# Patient Record
Sex: Female | Born: 1960 | Race: Black or African American | Hispanic: No | Smoking: Never smoker
Health system: Southern US, Community
[De-identification: ages and names within clinical notes are randomized; demographics above are authoritative.]

## PROBLEM LIST (undated history)

## (undated) ENCOUNTER — Emergency Department (HOSPITAL_COMMUNITY): Admission: EM | Payer: Self-pay | Source: Home / Self Care

## (undated) DIAGNOSIS — M899 Disorder of bone, unspecified: Secondary | ICD-10-CM

## (undated) DIAGNOSIS — K589 Irritable bowel syndrome without diarrhea: Secondary | ICD-10-CM

## (undated) DIAGNOSIS — I951 Orthostatic hypotension: Secondary | ICD-10-CM

## (undated) DIAGNOSIS — L97909 Non-pressure chronic ulcer of unspecified part of unspecified lower leg with unspecified severity: Secondary | ICD-10-CM

## (undated) DIAGNOSIS — K3184 Gastroparesis: Secondary | ICD-10-CM

## (undated) DIAGNOSIS — K5901 Slow transit constipation: Secondary | ICD-10-CM

## (undated) DIAGNOSIS — I10 Essential (primary) hypertension: Secondary | ICD-10-CM

## (undated) DIAGNOSIS — R143 Flatulence: Secondary | ICD-10-CM

## (undated) DIAGNOSIS — T8859XA Other complications of anesthesia, initial encounter: Secondary | ICD-10-CM

## (undated) DIAGNOSIS — K2100 Gastro-esophageal reflux disease with esophagitis, without bleeding: Secondary | ICD-10-CM

## (undated) DIAGNOSIS — R112 Nausea with vomiting, unspecified: Secondary | ICD-10-CM

## (undated) DIAGNOSIS — R142 Eructation: Secondary | ICD-10-CM

## (undated) DIAGNOSIS — Z9889 Other specified postprocedural states: Secondary | ICD-10-CM

## (undated) DIAGNOSIS — M949 Disorder of cartilage, unspecified: Secondary | ICD-10-CM

## (undated) DIAGNOSIS — T4145XA Adverse effect of unspecified anesthetic, initial encounter: Secondary | ICD-10-CM

## (undated) DIAGNOSIS — R141 Gas pain: Secondary | ICD-10-CM

## (undated) HISTORY — DX: Slow transit constipation: K59.01

## (undated) HISTORY — DX: Flatulence: R14.3

## (undated) HISTORY — PX: TUBAL LIGATION: SHX77

## (undated) HISTORY — DX: Disorder of bone, unspecified: M89.9

## (undated) HISTORY — DX: Orthostatic hypotension: I95.1

## (undated) HISTORY — DX: Gastroparesis: K31.84

## (undated) HISTORY — DX: Disorder of cartilage, unspecified: M94.9

## (undated) HISTORY — DX: Gas pain: R14.1

## (undated) HISTORY — DX: Gastro-esophageal reflux disease with esophagitis, without bleeding: K21.00

## (undated) HISTORY — DX: Irritable bowel syndrome, unspecified: K58.9

## (undated) HISTORY — DX: Gastro-esophageal reflux disease with esophagitis: K21.0

## (undated) HISTORY — DX: Gas pain: R14.2

## (undated) HISTORY — DX: Essential (primary) hypertension: I10

---

## 2001-06-25 ENCOUNTER — Emergency Department (HOSPITAL_COMMUNITY): Admission: EM | Admit: 2001-06-25 | Discharge: 2001-06-25 | Payer: Self-pay | Admitting: Emergency Medicine

## 2001-12-02 ENCOUNTER — Emergency Department (HOSPITAL_COMMUNITY): Admission: EM | Admit: 2001-12-02 | Discharge: 2001-12-02 | Payer: Self-pay | Admitting: Emergency Medicine

## 2003-01-22 ENCOUNTER — Other Ambulatory Visit: Admission: RE | Admit: 2003-01-22 | Discharge: 2003-01-22 | Payer: Self-pay | Admitting: Obstetrics and Gynecology

## 2003-01-22 ENCOUNTER — Encounter (INDEPENDENT_AMBULATORY_CARE_PROVIDER_SITE_OTHER): Payer: Self-pay

## 2003-01-22 ENCOUNTER — Encounter: Admission: RE | Admit: 2003-01-22 | Discharge: 2003-01-22 | Payer: Self-pay | Admitting: Obstetrics and Gynecology

## 2003-02-13 ENCOUNTER — Encounter: Admission: RE | Admit: 2003-02-13 | Discharge: 2003-02-13 | Payer: Self-pay | Admitting: Obstetrics and Gynecology

## 2003-05-11 ENCOUNTER — Emergency Department (HOSPITAL_COMMUNITY): Admission: EM | Admit: 2003-05-11 | Discharge: 2003-05-11 | Payer: Self-pay | Admitting: Hematology and Oncology

## 2003-09-11 ENCOUNTER — Emergency Department (HOSPITAL_COMMUNITY): Admission: EM | Admit: 2003-09-11 | Discharge: 2003-09-11 | Payer: Self-pay | Admitting: Internal Medicine

## 2004-03-29 ENCOUNTER — Emergency Department (HOSPITAL_COMMUNITY): Admission: EM | Admit: 2004-03-29 | Discharge: 2004-03-29 | Payer: Self-pay | Admitting: Emergency Medicine

## 2004-09-12 ENCOUNTER — Emergency Department (HOSPITAL_COMMUNITY): Admission: EM | Admit: 2004-09-12 | Discharge: 2004-09-12 | Payer: Self-pay | Admitting: Emergency Medicine

## 2005-02-16 ENCOUNTER — Emergency Department (HOSPITAL_COMMUNITY): Admission: EM | Admit: 2005-02-16 | Discharge: 2005-02-17 | Payer: Self-pay | Admitting: Emergency Medicine

## 2005-02-19 ENCOUNTER — Emergency Department (HOSPITAL_COMMUNITY): Admission: EM | Admit: 2005-02-19 | Discharge: 2005-02-19 | Payer: Self-pay | Admitting: Family Medicine

## 2006-11-02 ENCOUNTER — Ambulatory Visit (HOSPITAL_COMMUNITY): Admission: RE | Admit: 2006-11-02 | Discharge: 2006-11-02 | Payer: Self-pay | Admitting: Family Medicine

## 2007-07-04 ENCOUNTER — Emergency Department (HOSPITAL_COMMUNITY): Admission: EM | Admit: 2007-07-04 | Discharge: 2007-07-04 | Payer: Self-pay | Admitting: Emergency Medicine

## 2009-05-23 ENCOUNTER — Emergency Department (HOSPITAL_COMMUNITY): Admission: EM | Admit: 2009-05-23 | Discharge: 2009-05-23 | Payer: Self-pay | Admitting: Emergency Medicine

## 2009-10-13 ENCOUNTER — Emergency Department (HOSPITAL_COMMUNITY): Admission: EM | Admit: 2009-10-13 | Discharge: 2009-10-13 | Payer: Self-pay | Admitting: Emergency Medicine

## 2010-01-08 ENCOUNTER — Emergency Department (HOSPITAL_COMMUNITY): Admission: EM | Admit: 2010-01-08 | Discharge: 2010-01-08 | Payer: Self-pay | Admitting: Emergency Medicine

## 2010-02-24 ENCOUNTER — Encounter: Payer: Self-pay | Admitting: Gastroenterology

## 2010-03-14 ENCOUNTER — Ambulatory Visit (HOSPITAL_COMMUNITY): Admission: RE | Admit: 2010-03-14 | Discharge: 2010-03-14 | Payer: Self-pay | Admitting: Gastroenterology

## 2010-03-14 ENCOUNTER — Ambulatory Visit: Payer: Self-pay | Admitting: Gastroenterology

## 2010-12-04 ENCOUNTER — Encounter: Payer: Self-pay | Admitting: Family Medicine

## 2010-12-13 NOTE — Letter (Signed)
Summary: Triage Order  Triage Order   Imported By: Ave Filter 02/24/2010 10:08:09  _____________________________________________________________________  External Attachment:    Type:   Image     Comment:   External Document  Appended Document: Triage Order Take half Lantus dose on PM before TCS. Hold Lantus AM of procedure. HALFLYTELY.  Appended Document: Triage Order Instructions mailed to pt.

## 2011-01-31 LAB — GLUCOSE, CAPILLARY: Glucose-Capillary: 145 mg/dL — ABNORMAL HIGH (ref 70–99)

## 2011-02-01 LAB — COMPREHENSIVE METABOLIC PANEL
ALT: 10 U/L (ref 0–35)
AST: 15 U/L (ref 0–37)
Albumin: 3.3 g/dL — ABNORMAL LOW (ref 3.5–5.2)
Alkaline Phosphatase: 77 U/L (ref 39–117)
BUN: 14 mg/dL (ref 6–23)
CO2: 28 mEq/L (ref 19–32)
Calcium: 9 mg/dL (ref 8.4–10.5)
Chloride: 100 mEq/L (ref 96–112)
Creatinine, Ser: 0.86 mg/dL (ref 0.4–1.2)
GFR calc Af Amer: >60 mL/min (ref >60)
GFR calc non Af Amer: >60 mL/min (ref >60)
Glucose, Bld: 310 mg/dL — ABNORMAL HIGH (ref 70–99)
Potassium: 3.8 mEq/L (ref 3.5–5.1)
Sodium: 135 mEq/L (ref 135–145)
Total Bilirubin: 0.7 mg/dL (ref 0.3–1.2)
Total Protein: 7.1 g/dL (ref 6.0–8.3)

## 2011-02-01 LAB — CBC
HCT: 32.1 % — ABNORMAL LOW (ref 36.0–46.0)
Hemoglobin: 10.8 g/dL — ABNORMAL LOW (ref 12.0–15.0)
MCHC: 33.7 g/dL (ref 30.0–36.0)
MCV: 88.2 fL (ref 78.0–100.0)
Platelets: 272 10*3/uL (ref 150–400)
RBC: 3.63 MIL/uL — ABNORMAL LOW (ref 3.87–5.11)
RDW: 14.1 % (ref 11.5–15.5)
WBC: 6.3 10*3/uL (ref 4.0–10.5)

## 2011-02-01 LAB — DIFFERENTIAL
Basophils Absolute: 0 10*3/uL (ref 0.0–0.1)
Basophils Relative: 0 % (ref 0–1)
Eosinophils Absolute: 0 10*3/uL (ref 0.0–0.7)
Eosinophils Relative: 0 % (ref 0–5)
Lymphocytes Relative: 24 % (ref 12–46)
Lymphs Abs: 1.5 10*3/uL (ref 0.7–4.0)
Monocytes Absolute: 0.7 10*3/uL (ref 0.1–1.0)
Monocytes Relative: 11 % (ref 3–12)
Neutro Abs: 4.1 10*3/uL (ref 1.7–7.7)
Neutrophils Relative %: 65 % (ref 43–77)

## 2011-02-01 LAB — URINALYSIS, ROUTINE W REFLEX MICROSCOPIC
Bilirubin Urine: NEGATIVE
Glucose, UA: >1000 mg/dL — AB
Ketones, ur: NEGATIVE mg/dL
Nitrite: POSITIVE — AB
Protein, ur: 30 mg/dL — AB
Specific Gravity, Urine: 1.025 (ref 1.005–1.030)
Urobilinogen, UA: 0.2 mg/dL (ref 0.0–1.0)
pH: 5.5 (ref 5.0–8.0)

## 2011-02-01 LAB — URINE MICROSCOPIC-ADD ON

## 2011-02-01 LAB — PREGNANCY, URINE: Preg Test, Ur: NEGATIVE

## 2011-02-14 LAB — URINALYSIS, ROUTINE W REFLEX MICROSCOPIC
Bilirubin Urine: NEGATIVE
Glucose, UA: >1000 mg/dL — AB
Ketones, ur: NEGATIVE mg/dL
Nitrite: POSITIVE — AB
Protein, ur: NEGATIVE mg/dL
Specific Gravity, Urine: 1.01 (ref 1.005–1.030)
Urobilinogen, UA: 0.2 mg/dL (ref 0.0–1.0)
pH: 5.5 (ref 5.0–8.0)

## 2011-02-14 LAB — COMPREHENSIVE METABOLIC PANEL
ALT: 10 U/L (ref 0–35)
AST: 17 U/L (ref 0–37)
Albumin: 3.8 g/dL (ref 3.5–5.2)
Alkaline Phosphatase: 91 U/L (ref 39–117)
BUN: 7 mg/dL (ref 6–23)
CO2: 27 mEq/L (ref 19–32)
Calcium: 9.3 mg/dL (ref 8.4–10.5)
Chloride: 96 mEq/L (ref 96–112)
Creatinine, Ser: 0.89 mg/dL (ref 0.4–1.2)
GFR calc Af Amer: >60 mL/min (ref >60)
GFR calc non Af Amer: >60 mL/min (ref >60)
Glucose, Bld: 486 mg/dL — ABNORMAL HIGH (ref 70–99)
Potassium: 3.6 mEq/L (ref 3.5–5.1)
Sodium: 130 mEq/L — ABNORMAL LOW (ref 135–145)
Total Bilirubin: 0.8 mg/dL (ref 0.3–1.2)
Total Protein: 7.6 g/dL (ref 6.0–8.3)

## 2011-02-14 LAB — URINE MICROSCOPIC-ADD ON

## 2011-02-14 LAB — CBC
HCT: 32.3 % — ABNORMAL LOW (ref 36.0–46.0)
Hemoglobin: 10.8 g/dL — ABNORMAL LOW (ref 12.0–15.0)
MCHC: 33.5 g/dL (ref 30.0–36.0)
MCV: 86.6 fL (ref 78.0–100.0)
Platelets: 285 10*3/uL (ref 150–400)
RBC: 3.73 MIL/uL — ABNORMAL LOW (ref 3.87–5.11)
RDW: 13.7 % (ref 11.5–15.5)
WBC: 8.8 10*3/uL (ref 4.0–10.5)

## 2011-02-14 LAB — DIFFERENTIAL
Basophils Absolute: 0 10*3/uL (ref 0.0–0.1)
Basophils Relative: 0 % (ref 0–1)
Eosinophils Absolute: 0 10*3/uL (ref 0.0–0.7)
Eosinophils Relative: 0 % (ref 0–5)
Lymphocytes Relative: 17 % (ref 12–46)
Lymphs Abs: 1.5 10*3/uL (ref 0.7–4.0)
Monocytes Absolute: 0.5 10*3/uL (ref 0.1–1.0)
Monocytes Relative: 6 % (ref 3–12)
Neutro Abs: 6.8 10*3/uL (ref 1.7–7.7)
Neutrophils Relative %: 77 % (ref 43–77)

## 2011-02-14 LAB — LIPASE, BLOOD: Lipase: 18 U/L (ref 11–59)

## 2011-02-14 LAB — PREGNANCY, URINE: Preg Test, Ur: NEGATIVE

## 2011-02-14 LAB — GLUCOSE, CAPILLARY: Glucose-Capillary: 168 mg/dL — ABNORMAL HIGH (ref 70–99)

## 2011-02-19 LAB — URINE MICROSCOPIC-ADD ON

## 2011-02-19 LAB — URINALYSIS, ROUTINE W REFLEX MICROSCOPIC
Bilirubin Urine: NEGATIVE
Glucose, UA: 500 mg/dL — AB
Ketones, ur: NEGATIVE mg/dL
Nitrite: POSITIVE — AB
Protein, ur: 30 mg/dL — AB
Specific Gravity, Urine: 1.015 (ref 1.005–1.030)
Urobilinogen, UA: 0.2 mg/dL (ref 0.0–1.0)
pH: 7 (ref 5.0–8.0)

## 2011-02-19 LAB — URINE CULTURE: Colony Count: >=100000

## 2011-02-19 LAB — BASIC METABOLIC PANEL
BUN: 6 mg/dL (ref 6–23)
CO2: 28 mEq/L (ref 19–32)
Calcium: 8.7 mg/dL (ref 8.4–10.5)
Chloride: 105 mEq/L (ref 96–112)
Creatinine, Ser: 0.7 mg/dL (ref 0.4–1.2)
GFR calc Af Amer: >60 mL/min (ref >60)
GFR calc non Af Amer: >60 mL/min (ref >60)
Glucose, Bld: 188 mg/dL — ABNORMAL HIGH (ref 70–99)
Potassium: 3.7 mEq/L (ref 3.5–5.1)
Sodium: 136 mEq/L (ref 135–145)

## 2011-02-19 LAB — PREGNANCY, URINE: Preg Test, Ur: NEGATIVE

## 2011-05-08 ENCOUNTER — Emergency Department (HOSPITAL_COMMUNITY)
Admission: EM | Admit: 2011-05-08 | Discharge: 2011-05-08 | Disposition: A | Discharge status: Home / Self Care | Payer: Self-pay | Attending: Emergency Medicine | Admitting: Emergency Medicine

## 2011-05-08 DIAGNOSIS — R51 Headache: Secondary | ICD-10-CM | POA: Insufficient documentation

## 2011-05-08 DIAGNOSIS — N390 Urinary tract infection, site not specified: Secondary | ICD-10-CM | POA: Insufficient documentation

## 2011-05-08 DIAGNOSIS — R7309 Other abnormal glucose: Secondary | ICD-10-CM | POA: Insufficient documentation

## 2011-05-08 DIAGNOSIS — R42 Dizziness and giddiness: Secondary | ICD-10-CM | POA: Insufficient documentation

## 2011-05-08 LAB — URINE MICROSCOPIC-ADD ON

## 2011-05-08 LAB — URINALYSIS, ROUTINE W REFLEX MICROSCOPIC
Glucose, UA: >1000 mg/dL — AB
Hgb urine dipstick: NEGATIVE
Leukocytes, UA: NEGATIVE
Protein, ur: NEGATIVE mg/dL
Specific Gravity, Urine: 1.015 (ref 1.005–1.030)
pH: 6 (ref 5.0–8.0)

## 2011-05-08 LAB — GLUCOSE, CAPILLARY
Glucose-Capillary: >600 mg/dL (ref 70–99)
Glucose-Capillary: 313 mg/dL — ABNORMAL HIGH (ref 70–99)

## 2011-05-08 LAB — DIFFERENTIAL
Basophils Absolute: 0 10*3/uL (ref 0.0–0.1)
Basophils Relative: 0 % (ref 0–1)
Eosinophils Relative: 1 % (ref 0–5)
Lymphocytes Relative: 28 % (ref 12–46)
Monocytes Absolute: 0.4 10*3/uL (ref 0.1–1.0)
Neutro Abs: 3.5 10*3/uL (ref 1.7–7.7)

## 2011-05-08 LAB — CBC
HCT: 33.5 % — ABNORMAL LOW (ref 36.0–46.0)
MCHC: 33.7 g/dL (ref 30.0–36.0)
Platelets: 247 10*3/uL (ref 150–400)
RDW: 12.4 % (ref 11.5–15.5)
WBC: 5.3 10*3/uL (ref 4.0–10.5)

## 2011-05-08 LAB — HEPATIC FUNCTION PANEL
AST: 15 U/L (ref 0–37)
Albumin: 3.4 g/dL — ABNORMAL LOW (ref 3.5–5.2)
Total Protein: 7.1 g/dL (ref 6.0–8.3)

## 2011-05-08 LAB — BASIC METABOLIC PANEL
BUN: 14 mg/dL (ref 6–23)
Calcium: 9.5 mg/dL (ref 8.4–10.5)
GFR calc Af Amer: >60 mL/min (ref >60)
GFR calc non Af Amer: >60 mL/min (ref >60)
Glucose, Bld: 622 mg/dL (ref 70–99)
Potassium: 4.1 mEq/L (ref 3.5–5.1)
Sodium: 130 mEq/L — ABNORMAL LOW (ref 135–145)

## 2011-05-10 LAB — URINE CULTURE: Colony Count: >=100000

## 2011-05-28 ENCOUNTER — Emergency Department (HOSPITAL_COMMUNITY)
Admission: EM | Admit: 2011-05-28 | Discharge: 2011-05-28 | Disposition: A | Discharge status: Home / Self Care | Payer: Self-pay | Attending: Emergency Medicine | Admitting: Emergency Medicine

## 2011-05-28 DIAGNOSIS — E1169 Type 2 diabetes mellitus with other specified complication: Secondary | ICD-10-CM | POA: Insufficient documentation

## 2011-05-28 DIAGNOSIS — E162 Hypoglycemia, unspecified: Secondary | ICD-10-CM

## 2011-05-28 DIAGNOSIS — R42 Dizziness and giddiness: Secondary | ICD-10-CM | POA: Insufficient documentation

## 2011-05-28 DIAGNOSIS — Z79899 Other long term (current) drug therapy: Secondary | ICD-10-CM | POA: Insufficient documentation

## 2011-05-28 DIAGNOSIS — R5381 Other malaise: Secondary | ICD-10-CM | POA: Insufficient documentation

## 2011-05-28 DIAGNOSIS — R6883 Chills (without fever): Secondary | ICD-10-CM | POA: Insufficient documentation

## 2011-05-28 DIAGNOSIS — R4182 Altered mental status, unspecified: Secondary | ICD-10-CM | POA: Insufficient documentation

## 2011-05-28 DIAGNOSIS — R5383 Other fatigue: Secondary | ICD-10-CM | POA: Insufficient documentation

## 2011-05-28 LAB — DIFFERENTIAL
Basophils Absolute: 0 10*3/uL (ref 0.0–0.1)
Basophils Relative: 0 % (ref 0–1)
Eosinophils Relative: 1 % (ref 0–5)
Monocytes Absolute: 0.6 10*3/uL (ref 0.1–1.0)
Neutro Abs: 3.9 10*3/uL (ref 1.7–7.7)

## 2011-05-28 LAB — URINALYSIS, ROUTINE W REFLEX MICROSCOPIC
Ketones, ur: NEGATIVE mg/dL
Leukocytes, UA: NEGATIVE
Nitrite: NEGATIVE
Protein, ur: NEGATIVE mg/dL

## 2011-05-28 LAB — GLUCOSE, CAPILLARY: Glucose-Capillary: 121 mg/dL — ABNORMAL HIGH (ref 70–99)

## 2011-05-28 LAB — CBC
HCT: 32.7 % — ABNORMAL LOW (ref 36.0–46.0)
MCHC: 33 g/dL (ref 30.0–36.0)
Platelets: 248 10*3/uL (ref 150–400)
RDW: 13.4 % (ref 11.5–15.5)

## 2011-05-28 LAB — BASIC METABOLIC PANEL
Calcium: 9.2 mg/dL (ref 8.4–10.5)
Chloride: 101 mEq/L (ref 96–112)
Creatinine, Ser: 0.69 mg/dL (ref 0.50–1.10)
GFR calc Af Amer: >60 mL/min (ref >60)
Sodium: 135 mEq/L (ref 135–145)

## 2011-05-28 NOTE — ED Provider Notes (Signed)
History     Chief Complaint  Patient presents with  . Dizziness  . Fatigue  . Altered Mental Status   HPI Comments: Patient c/o sudden onset of generalized dizziness, weakness and feeling of confusion that began suddenly while she was sitting at church.  States she ate a small amt of food this morning and also states that she has not been checking her blood sugars lately, but usually been in the 300-400 range.  Was recently increased on her metformin and lantus dosages.  She denies vomitting, nausea, chest pain or shortness of breath.  She was concerned that her blood sugar may have been too low.  She also denies focal weakness, facial weakness or drooping, or speech difficulty  Patient is a 50 y.o. female presenting with altered mental status. The history is provided by the patient.  Altered Mental Status This is a new problem. The current episode started today. The problem occurs rarely. The problem has been resolved. Associated symptoms include chills, fatigue, headaches and weakness. Pertinent negatives include no abdominal pain, anorexia, arthralgias, change in bowel habit, chest pain, congestion, coughing, diaphoresis, fever, myalgias, nausea, neck pain, numbness, sore throat, urinary symptoms, visual change or vomiting. The symptoms are aggravated by nothing. She has tried nothing for the symptoms. The treatment provided no relief.    Past Medical History  Diagnosis Date  . Diabetes mellitus     History reviewed. No pertinent past surgical history.  History reviewed. No pertinent family history.  History  Substance Use Topics  . Smoking status: Never Smoker   . Smokeless tobacco: Not on file  . Alcohol Use: No    OB History    Grav Para Term Preterm Abortions TAB SAB Ect Mult Living                  Review of Systems  Constitutional: Positive for chills and fatigue. Negative for fever, diaphoresis and unexpected weight change.  HENT: Negative for ear pain, congestion,  sore throat, neck pain, sinus pressure and tinnitus.   Eyes: Negative for pain.  Respiratory: Negative for apnea, cough, chest tightness and shortness of breath.   Cardiovascular: Negative for chest pain, palpitations and leg swelling.  Gastrointestinal: Negative for nausea, vomiting, abdominal pain, anorexia and change in bowel habit.  Genitourinary: Negative for dysuria, urgency and frequency.  Musculoskeletal: Negative for myalgias and arthralgias.  Skin: Negative.   Neurological: Positive for dizziness, weakness and headaches. Negative for seizures, syncope, speech difficulty and numbness.  Hematological: Does not bruise/bleed easily.  Psychiatric/Behavioral: Positive for altered mental status. Negative for behavioral problems.    Physical Exam  BP 142/0  Pulse 72  Temp(Src) 98.6 F (37 C) (Oral)  Resp 20  Ht 5\' 7"  (1.702 m)  Wt 170 lb (77.111 kg)  BMI 26.63 kg/m2  SpO2 95%  LMP 04/15/2011  Physical Exam  Constitutional: She is oriented to person, place, and time. She appears well-developed and well-nourished.  Non-toxic appearance. No distress.  HENT:  Head: Normocephalic and atraumatic.  Eyes: EOM are normal. Pupils are equal, round, and reactive to light.  Neck: Normal range of motion. Neck supple.  Cardiovascular: Normal rate, regular rhythm and normal heart sounds.   Pulmonary/Chest: Effort normal and breath sounds normal.  Abdominal: Soft. Bowel sounds are normal.  Musculoskeletal: Normal range of motion.  Neurological: She is alert and oriented to person, place, and time. No cranial nerve deficit. She exhibits normal muscle tone. Coordination normal.  Skin: Skin is warm and dry.  Psychiatric: She has a normal mood and affect.    ED Course  Procedures  MDM  PAtient is alert, No focal neuro deficits on exam.  Eating a snack.  Hypoglycemic today.  Has began new medication regimen one week ago.  Hypoglycemia likely related to new medications.  Will consult for  admission     Zanovia L. Trisha Mangle, [place] 05/28/11 1610  1624  Consulted hospitalist, will let pt eat a meal tray and continue drinking fluids and recheck CBG in hr.  Will call hospitalist back at that time  1732  Patient feeling better, has drank fluids, ate a snack and meal tray with ice cream.  Prefers to f/u with the free clinic tomorrow and also understands importance of frequent blood sugar checks.  I have discussed pt care plan with the EDP and  hospitalist and hospitalist suggested to D/C the metformin and glipizide and take the lantus 35 units once a day until rechecked by her PMD.  Pt verbalized understanding and agrees to this care plan.    Ivadell L. Kris Burd, [place] 05/28/11 1740

## 2011-05-28 NOTE — ED Notes (Signed)
CRITICAL VALUE ALERT  Critical value received:  Blood glucose 42  Date of notification:  05/28/11  Time of notification:  1550  Critical value read back:yes  Nurse who received alert:  Lake Bells, RN  MD notified: Dr Doylene Canard   Time MD notified: [phone]

## 2011-05-28 NOTE — ED Notes (Signed)
Patient ambulatory to restroom to give urine sample. Patient in NAD. Ambulatory with steady gait.

## 2011-05-28 NOTE — ED Notes (Signed)
Pt presents with dizziness, weakness, and confusion. Pt states she is a diabetic. Pt states she took her Lantus and ate this AM prior to church. Pt also c/o sweating.

## 2011-05-28 NOTE — ED Notes (Signed)
MD at bedside. 

## 2011-05-28 NOTE — ED Notes (Signed)
Patient with no complaints at this time. Respirations even and unlabored. Skin warm/dry. Discharge instructions reviewed with patient at this time. Patient given opportunity to voice concerns/ask questions. Patient discharged at this time and left Emergency Department with steady gait.   

## 2011-05-29 LAB — POCT PREGNANCY, URINE: Preg Test, Ur: NEGATIVE

## 2011-06-26 NOTE — ED Provider Notes (Signed)
History     CSN: [id number] Arrival date & time: 05/28/2011  1:31 PM  Chief Complaint  Patient presents with  . Dizziness  . Fatigue  . Altered Mental Status   HPI  Past Medical History  Diagnosis Date  . Diabetes mellitus     History reviewed. No pertinent past surgical history.  History reviewed. No pertinent family history.  History  Substance Use Topics  . Smoking status: Never Smoker   . Smokeless tobacco: Not on file  . Alcohol Use: No    OB History    Grav Para Term Preterm Abortions TAB SAB Ect Mult Living                  Review of Systems  Physical Exam  BP 128/85  Pulse 66  Temp(Src) 98.6 F (37 C) (Oral)  Resp 20  Ht 5\' 7"  (1.702 m)  Wt 170 lb (77.111 kg)  BMI 26.63 kg/m2  SpO2 99%  LMP 04/15/2011  Physical Exam  ED Course  Procedures  MDM  Evaluation and management procedures were performed by the PA/NP under my supervision/collaboration.        Felisa Bonier, MD 06/26/11 [phone]

## 2011-08-12 ENCOUNTER — Encounter (HOSPITAL_COMMUNITY): Payer: Self-pay | Admitting: *Deleted

## 2011-08-12 ENCOUNTER — Emergency Department (HOSPITAL_COMMUNITY)
Admission: EM | Admit: 2011-08-12 | Discharge: 2011-08-12 | Disposition: A | Discharge status: Home / Self Care | Payer: Self-pay | Attending: Emergency Medicine | Admitting: Emergency Medicine

## 2011-08-12 DIAGNOSIS — D649 Anemia, unspecified: Secondary | ICD-10-CM | POA: Insufficient documentation

## 2011-08-12 DIAGNOSIS — E119 Type 2 diabetes mellitus without complications: Secondary | ICD-10-CM | POA: Insufficient documentation

## 2011-08-12 DIAGNOSIS — R5383 Other fatigue: Secondary | ICD-10-CM | POA: Insufficient documentation

## 2011-08-12 DIAGNOSIS — N390 Urinary tract infection, site not specified: Secondary | ICD-10-CM

## 2011-08-12 DIAGNOSIS — E860 Dehydration: Secondary | ICD-10-CM

## 2011-08-12 DIAGNOSIS — Z79899 Other long term (current) drug therapy: Secondary | ICD-10-CM | POA: Insufficient documentation

## 2011-08-12 DIAGNOSIS — R5381 Other malaise: Secondary | ICD-10-CM | POA: Insufficient documentation

## 2011-08-12 LAB — BASIC METABOLIC PANEL
BUN: 8 mg/dL (ref 6–23)
CO2: 28 mEq/L (ref 19–32)
Chloride: 100 mEq/L (ref 96–112)
Creatinine, Ser: 0.86 mg/dL (ref 0.50–1.10)
Glucose, Bld: 385 mg/dL — ABNORMAL HIGH (ref 70–99)

## 2011-08-12 LAB — CBC
HCT: 32.9 % — ABNORMAL LOW (ref 36.0–46.0)
MCH: 30 pg (ref 26.0–34.0)
MCV: 87.3 fL (ref 78.0–100.0)
RDW: 12.4 % (ref 11.5–15.5)
WBC: 4.1 10*3/uL (ref 4.0–10.5)

## 2011-08-12 LAB — URINALYSIS, ROUTINE W REFLEX MICROSCOPIC
Bilirubin Urine: NEGATIVE
Specific Gravity, Urine: 1.02 (ref 1.005–1.030)
Urobilinogen, UA: 0.2 mg/dL (ref 0.0–1.0)

## 2011-08-12 LAB — URINE MICROSCOPIC-ADD ON

## 2011-08-12 LAB — GLUCOSE, CAPILLARY: Glucose-Capillary: 420 mg/dL — ABNORMAL HIGH (ref 70–99)

## 2011-08-12 MED ORDER — CIPROFLOXACIN IN D5W 400 MG/200ML IV SOLN
400.0000 mg | Freq: Once | INTRAVENOUS | Status: AC
  Administered 2011-08-12: 400 mg via INTRAVENOUS
  Filled 2011-08-12: qty 200

## 2011-08-12 MED ORDER — SODIUM CHLORIDE 0.9 % IV BOLUS (SEPSIS)
1000.0000 mL | Freq: Once | INTRAVENOUS | Status: AC
  Administered 2011-08-12: 1000 mL via INTRAVENOUS

## 2011-08-12 MED ORDER — INSULIN ASPART 100 UNIT/ML ~~LOC~~ SOLN
10.0000 [IU] | Freq: Once | SUBCUTANEOUS | Status: AC
  Administered 2011-08-12: 10 [IU] via INTRAVENOUS

## 2011-08-12 MED ORDER — CIPROFLOXACIN HCL 500 MG PO TABS: 500.0000 mg | ORAL_TABLET | Freq: Two times a day (BID) | ORAL | Status: AC

## 2011-08-12 MED ORDER — SODIUM CHLORIDE 0.9 % IV BOLUS (SEPSIS)
500.0000 mL | Freq: Once | INTRAVENOUS | Status: AC
  Administered 2011-08-12: 1000 mL via INTRAVENOUS

## 2011-08-12 NOTE — ED Provider Notes (Signed)
History     CSN: [id number] Arrival date & time: 08/12/2011  1:08 PM  Chief Complaint  Patient presents with  . Fatigue    (Consider location/radiation/quality/duration/timing/severity/associated sxs/prior treatment) HPI Patient presents to the emergency department with chief complaint of fatigue for the last 3-4 days following termination of blood. Patient is also a known diabetic and states that her blood sugar was elevated to greater than 400 when she was in triage. She says she has been taking her medications but she did not take them this morning. She she has no signs of infection such as fever, cough, nausea, vomiting or abdominal pain. Past Medical History  Diagnosis Date  . Diabetes mellitus     History reviewed. No pertinent past surgical history.  History reviewed. No pertinent family history.  History  Substance Use Topics  . Smoking status: Never Smoker   . Smokeless tobacco: Not on file  . Alcohol Use: No    OB History    Grav Para Term Preterm Abortions TAB SAB Ect Mult Living                  Review of Systems  All other systems reviewed and are negative.    Allergies  Review of patient's allergies indicates no known allergies.  Home Medications   Current Outpatient Rx  Name Route Sig Dispense Refill  . CEPHALEXIN 500 MG PO CAPS Oral Take 500 mg by mouth 3 (three) times daily.      Marland Kitchen GLIPIZIDE 10 MG PO TABS Oral Take 10 mg by mouth 2 (two) times daily before a meal.      . INSULIN GLARGINE 100 UNIT/ML [place] SOLN Subcutaneous Inject 35 Units into the skin 2 (two) times daily.      Marland Kitchen LISINOPRIL 10 MG PO TABS Oral Take 10 mg by mouth daily.      Marland Kitchen METFORMIN HCL 500 MG (OSM) PO TB24 Oral Take 500 mg by mouth 2 (two) times daily.      Marland Kitchen METFORMIN HCL 500 MG PO TABS Oral Take 500 mg by mouth 2 (two) times daily with a meal.        BP 121/72  Pulse 92  Temp(Src) 97.5 F (36.4 C) (Oral)  Resp 18  SpO2 100%  LMP 02/09/2011  Physical Exam  Nursing  note and vitals reviewed. Constitutional: She is oriented to person, place, and time. She appears well-developed and well-nourished. No distress.  HENT:  Head: Normocephalic and atraumatic.  Eyes: Pupils are equal, round, and reactive to light.  Neck: Normal range of motion.  Cardiovascular: Normal rate and intact distal pulses.   Pulmonary/Chest: No respiratory distress.  Abdominal: Normal appearance. She exhibits no distension.  Musculoskeletal: Normal range of motion.  Neurological: She is alert and oriented to person, place, and time. No cranial nerve deficit.  Skin: Skin is warm and dry. No rash noted.  Psychiatric: She has a normal mood and affect. Her behavior is normal.    ED Course  Procedures (including critical care time) Plan is for hydration treatment of her hyperglycemia with insulin recheck labs, check H&H.   Results for orders placed during the hospital encounter of 08/12/11  GLUCOSE, CAPILLARY      Component Value Range   Glucose-Capillary 420 (*) 70 - 99 (mg/dL)  BASIC METABOLIC PANEL      Component Value Range   Sodium 136  135 - 145 (mEq/L)   Potassium 3.8  3.5 - 5.1 (mEq/L)   Chloride 100  96 - 112 (mEq/L)   CO2 28  19 - 32 (mEq/L)   Glucose, Bld 385 (*) 70 - 99 (mg/dL)   BUN 8  6 - 23 (mg/dL)   Creatinine, Ser 1.61  0.50 - 1.10 (mg/dL)   Calcium 9.0  8.4 - 09.6 (mg/dL)   GFR calc non Af Amer >60  >60 (mL/min)   GFR calc Af Amer >60  >60 (mL/min)  CBC      Component Value Range   WBC 4.1  4.0 - 10.5 (K/uL)   RBC 3.77 (*) 3.87 - 5.11 (MIL/uL)   Hemoglobin 11.3 (*) 12.0 - 15.0 (g/dL)   HCT 04.5 (*) 40.9 - 46.0 (%)   MCV 87.3  78.0 - 100.0 (fL)   MCH 30.0  26.0 - 34.0 (pg)   MCHC 34.3  30.0 - 36.0 (g/dL)   RDW 81.1  91.4 - 78.2 (%)   Platelets 264  150 - 400 (K/uL)  URINALYSIS, ROUTINE W REFLEX MICROSCOPIC      Component Value Range   Color, Urine YELLOW  YELLOW    Appearance CLOUDY (*) CLEAR    Specific Gravity, Urine 1.020  1.005 - 1.030    pH  6.0  5.0 - 8.0    Glucose, UA >1000 (*) NEGATIVE (mg/dL)   Hgb urine dipstick TRACE (*) NEGATIVE    Bilirubin Urine NEGATIVE  NEGATIVE    Ketones, ur NEGATIVE  NEGATIVE (mg/dL)   Protein, ur NEGATIVE  NEGATIVE (mg/dL)   Urobilinogen, UA 0.2  0.0 - 1.0 (mg/dL)   Nitrite POSITIVE (*) NEGATIVE    Leukocytes, UA SMALL (*) NEGATIVE   URINE MICROSCOPIC-ADD ON      Component Value Range   Squamous Epithelial / LPF MANY (*) RARE    WBC, UA 21-50  <3 (WBC/hpf)   RBC / HPF 7-10  <3 (RBC/hpf)   Bacteria, UA MANY (*) RARE   GLUCOSE, CAPILLARY      Component Value Range   Glucose-Capillary 194 (*) 70 - 99 (mg/dL)   Comment 1 Documented in Chart     Comment 2 Notify RN     Diagnosis:  #1 UTI  #2 dehydration  #3 anemia.      MDM          Nelia Shi, MD 08/12/11 [phone]

## 2011-08-12 NOTE — ED Notes (Signed)
Pt states she has been feeling weak since the 22nd of this month. Pt states she gave blood on that day and immediately felt weak.

## 2011-08-14 LAB — URINE CULTURE

## 2011-08-15 NOTE — ED Notes (Signed)
Results received from [facility]. (+) URNC, >/= 100,000 colonies -> Klebsiella Pneu.  Rx given in ED for Cipro  -> sensitive to the same.  Chart appended per protocol.

## 2011-08-25 LAB — CULTURE, ROUTINE-ABSCESS

## 2012-04-03 ENCOUNTER — Emergency Department (HOSPITAL_COMMUNITY)
Admission: EM | Admit: 2012-04-03 | Discharge: 2012-04-03 | Disposition: A | Discharge status: Home / Self Care | Payer: Self-pay | Attending: Emergency Medicine | Admitting: Emergency Medicine

## 2012-04-03 ENCOUNTER — Encounter (HOSPITAL_COMMUNITY): Payer: Self-pay | Admitting: *Deleted

## 2012-04-03 DIAGNOSIS — N390 Urinary tract infection, site not specified: Secondary | ICD-10-CM | POA: Insufficient documentation

## 2012-04-03 DIAGNOSIS — R739 Hyperglycemia, unspecified: Secondary | ICD-10-CM

## 2012-04-03 DIAGNOSIS — R51 Headache: Secondary | ICD-10-CM | POA: Insufficient documentation

## 2012-04-03 DIAGNOSIS — Z79899 Other long term (current) drug therapy: Secondary | ICD-10-CM | POA: Insufficient documentation

## 2012-04-03 DIAGNOSIS — Z794 Long term (current) use of insulin: Secondary | ICD-10-CM | POA: Insufficient documentation

## 2012-04-03 DIAGNOSIS — R42 Dizziness and giddiness: Secondary | ICD-10-CM | POA: Insufficient documentation

## 2012-04-03 DIAGNOSIS — E119 Type 2 diabetes mellitus without complications: Secondary | ICD-10-CM | POA: Insufficient documentation

## 2012-04-03 LAB — URINALYSIS, ROUTINE W REFLEX MICROSCOPIC
Glucose, UA: >1000 mg/dL — AB
Hgb urine dipstick: NEGATIVE
Ketones, ur: NEGATIVE mg/dL
Protein, ur: NEGATIVE mg/dL

## 2012-04-03 LAB — CBC
Hemoglobin: 12.4 g/dL (ref 12.0–15.0)
MCH: 30.2 pg (ref 26.0–34.0)
MCV: 88.3 fL (ref 78.0–100.0)
RBC: 4.11 MIL/uL (ref 3.87–5.11)

## 2012-04-03 LAB — GLUCOSE, CAPILLARY
Glucose-Capillary: 406 mg/dL — ABNORMAL HIGH (ref 70–99)
Glucose-Capillary: 226 mg/dL — ABNORMAL HIGH (ref 70–99)
Glucose-Capillary: 154 mg/dL — ABNORMAL HIGH (ref 70–99)
Glucose-Capillary: 177 mg/dL — ABNORMAL HIGH (ref 70–99)

## 2012-04-03 LAB — DIFFERENTIAL
Eosinophils Absolute: 0 10*3/uL (ref 0.0–0.7)
Eosinophils Relative: 1 % (ref 0–5)
Lymphs Abs: 2.1 10*3/uL (ref 0.7–4.0)
Monocytes Relative: 5 % (ref 3–12)

## 2012-04-03 LAB — BASIC METABOLIC PANEL
CO2: 25 mEq/L (ref 19–32)
Calcium: 8.3 mg/dL — ABNORMAL LOW (ref 8.4–10.5)
Chloride: 102 mEq/L (ref 96–112)
GFR calc Af Amer: >90 mL/min (ref >90)
Sodium: 136 mEq/L (ref 135–145)

## 2012-04-03 MED ORDER — SODIUM CHLORIDE 0.9 % IV SOLN
INTRAVENOUS | Status: DC
  Administered 2012-04-03: 19:00:00 via INTRAVENOUS

## 2012-04-03 MED ORDER — CIPROFLOXACIN HCL 500 MG PO TABS: 500.0000 mg | ORAL_TABLET | Freq: Two times a day (BID) | ORAL | Status: AC

## 2012-04-03 MED ORDER — INSULIN REGULAR BOLUS VIA INFUSION
0.0000 [IU] | Freq: Three times a day (TID) | INTRAVENOUS | Status: DC
  Filled 2012-04-03: qty 10

## 2012-04-03 MED ORDER — DEXTROSE 5 % IV SOLN
1.0000 g | Freq: Once | INTRAVENOUS | Status: AC
  Administered 2012-04-03: 1 g via INTRAVENOUS
  Filled 2012-04-03: qty 10

## 2012-04-03 MED ORDER — SODIUM CHLORIDE 0.9 % IV SOLN
INTRAVENOUS | Status: DC
  Administered 2012-04-03: 3.5 [IU]/h via INTRAVENOUS
  Administered 2012-04-03: 1.7 [IU]/h via INTRAVENOUS
  Filled 2012-04-03: qty 1

## 2012-04-03 MED ORDER — DEXTROSE 50 % IV SOLN: 25.0000 mL | INTRAVENOUS | Status: DC | PRN

## 2012-04-03 MED ORDER — SODIUM CHLORIDE 0.9 % IV BOLUS (SEPSIS)
2000.0000 mL | Freq: Once | INTRAVENOUS | Status: AC
  Administered 2012-04-03: 2000 mL via INTRAVENOUS

## 2012-04-03 MED ORDER — DEXTROSE-NACL 5-0.45 % IV SOLN
INTRAVENOUS | Status: DC
  Administered 2012-04-03: 20:00:00 via INTRAVENOUS

## 2012-04-03 NOTE — Progress Notes (Signed)
ED CM noted pt with no pcp listed, pt stating she was going to free clinic but stopped.  Pt address listed as [address] [place] ([place]) There is only one indigent clinic available and pt has stopped visiting the clinic

## 2012-04-03 NOTE — ED Notes (Addendum)
Pt comes in with c/o of constant headache across forehead x12 days, 9/10 pain makes pt feel nauseated and like she is going to pass out. Reports dizziness and feeling unbalanced, has not fell, but has tripped several times. Blood pressure taken 3 times all times 161/101.  Pt has taken OTC medication without lasting relief. Hx of diabetes but has not checked blood sugar in 6 months. Has not taken blood sugar medicine in 2 months because cant afford it. Was going to the free clinic

## 2012-04-03 NOTE — ED Provider Notes (Signed)
History     CSN: [id number]  Arrival date & time 04/03/12  1544   First MD Initiated Contact with Patient 04/03/12 1658      Chief Complaint  Patient presents with  . Migraine    (Consider location/radiation/quality/duration/timing/severity/associated sxs/prior treatment) HPI Comments: Jennifer [name] is a 51 y.o. Female who complains of headache for 2 weeks, lightheadedness, and high blood pressure. She has tried over-the-counter analgesics without relief. She has been on Lantus insulin in the past, but has been off it for several months. She is not currently seeing a primary care Dr. She has noticed a urine holder, but no urinary frequency, or dysuria. She denies cough, chills, chest pain, back pain.  Patient is a 51 y.o. female presenting with migraine. The history is provided by the patient.  Migraine    Past Medical History  Diagnosis Date  . Diabetes mellitus     History reviewed. No pertinent past surgical history.  History reviewed. No pertinent family history.  History  Substance Use Topics  . Smoking status: Never Smoker   . Smokeless tobacco: Not on file  . Alcohol Use: No    OB History    Grav Para Term Preterm Abortions TAB SAB Ect Mult Living                  Review of Systems  All other systems reviewed and are negative.    Allergies  Review of patient's allergies indicates no known allergies.  Home Medications   Current Outpatient Rx  Name Route Sig Dispense Refill  . BC HEADACHE POWDER PO Oral Take 1 packet by mouth 2 (two) times daily as needed. For pain.    Marland Kitchen GLIPIZIDE 10 MG PO TABS Oral Take 10 mg by mouth 2 (two) times daily before a meal.      . INSULIN GLARGINE 100 UNIT/ML [place] SOLN Subcutaneous Inject 35 Units into the skin 2 (two) times daily.      Marland Kitchen LISINOPRIL 10 MG PO TABS Oral Take 10 mg by mouth daily.      Marland Kitchen CIPROFLOXACIN HCL 500 MG PO TABS Oral Take 1 tablet (500 mg total) by mouth every 12 (twelve) hours. 10 tablet 0    BP  142/99  Pulse 85  Temp(Src) 97.8 F (36.6 C) (Oral)  Resp 18  SpO2 100%  Physical Exam  Nursing note and vitals reviewed. Constitutional: She is oriented to person, place, and time. She appears well-developed and well-nourished.  HENT:  Head: Normocephalic and atraumatic.  Eyes: Conjunctivae and EOM are normal. Pupils are equal, round, and reactive to light.  Neck: Normal range of motion and phonation normal. Neck supple.  Cardiovascular: Normal rate, regular rhythm and intact distal pulses.   Pulmonary/Chest: Effort normal and breath sounds normal. She exhibits no tenderness.  Abdominal: Soft. She exhibits no distension. There is no tenderness. There is no guarding.  Musculoskeletal: Normal range of motion.  Neurological: She is alert and oriented to person, place, and time. She has normal strength. She exhibits normal muscle tone.  Skin: Skin is warm and dry.  Psychiatric: She has a normal mood and affect. Her behavior is normal. Judgment and thought content normal.    ED Course  Procedures (including critical care time)  Emergency department treatment: IV fluid 2 L bolus and maintenance. IV insulin per glucose stabilizer program. IV  Rocephin for apparent urinary tract infection.  22:15- patient feels much better. She denies headache, weakness, dizziness, nausea, or vomiting. CBG is  less than 200. She states that she has her Lantus insulin at home. She has stopped taking metformin at the advice of her physician.   Labs Reviewed  GLUCOSE, CAPILLARY - Abnormal; Notable for the following:    Glucose-Capillary 520 (*)    All other components within normal limits  DIFFERENTIAL - Abnormal; Notable for the following:    Neutrophils Relative 42 (*)    Lymphocytes Relative 52 (*)    All other components within normal limits  URINALYSIS, ROUTINE W REFLEX MICROSCOPIC - Abnormal; Notable for the following:    APPearance CLOUDY (*)    Specific Gravity, Urine 1.039 (*)    Glucose, UA  >1000 (*)    Nitrite POSITIVE (*)    All other components within normal limits  URINE MICROSCOPIC-ADD ON - Abnormal; Notable for the following:    Squamous Epithelial / LPF FEW (*)    Bacteria, UA MANY (*)    All other components within normal limits  GLUCOSE, CAPILLARY - Abnormal; Notable for the following:    Glucose-Capillary 406 (*)    All other components within normal limits  BASIC METABOLIC PANEL - Abnormal; Notable for the following:    Glucose, Bld 235 (*)    Calcium 8.3 (*)    All other components within normal limits  GLUCOSE, CAPILLARY - Abnormal; Notable for the following:    Glucose-Capillary 226 (*)    All other components within normal limits  GLUCOSE, CAPILLARY - Abnormal; Notable for the following:    Glucose-Capillary 157 (*)    All other components within normal limits  CBC  URINE CULTURE   No results found.   1. Hyperglycemia   2. UTI (lower urinary tract infection)       MDM  Hyperglycemia, and urinary tract infection, without DKA. She is stable for discharge with outpatient treatment. Doubt serious bacterial infection, sepsis, DKA, impending vascular collapse.    Plan: Home Medications- Cipro; Home Treatments- Increase oral fluids; Recommended follow up- PCP 1 week and prn        Flint Melter, MD 04/03/12 2221

## 2012-04-03 NOTE — ED Notes (Signed)
MD at bedside. Dr. Wentz at bedside.  

## 2012-04-03 NOTE — Discharge Instructions (Signed)
Start the antibiotic prescription tomorrow. It was sent to your [facility] pharmacy. Begin your Lantus insulin at the usual dose, 35 units each evening, tonight. Drink 1-2 L of water each day. See your Dr. next week.     Diabetes, Frequently Asked Questions WHAT IS DIABETES? Most of the food we eat is turned into glucose (sugar). Our bodies use it for energy. The pancreas makes a hormone called insulin. It helps glucose get into the cells of our bodies. When you have diabetes, your body either does not make enough insulin or cannot use its own insulin as well as it should. This causes sugars to build up in your blood. WHAT ARE THE SYMPTOMS OF DIABETES?  Frequent urination.   Excessive thirst.   Unexplained weight loss.   Extreme hunger.   Blurred vision.   Tingling or numbness in hands or feet.   Feeling very tired much of the time.   Dry, itchy skin.   Sores that are slow to heal.   Yeast infections.  WHAT ARE THE TYPES OF DIABETES? Type 1 Diabetes   About 10% of affected people have this type.   Usually occurs before the age of 22.   Usually occurs in thin to normal weight people.  Type 2 Diabetes  About 90% of affected people have this type.   Usually occurs after the age of 78.   Usually occurs in overweight people.   More likely to have:   A family history of diabetes.   A history of diabetes during pregnancy (gestational diabetes).   High blood pressure.   High cholesterol and triglycerides.  Gestational Diabetes  Occurs in about 4% of pregnancies.   Usually goes away after the baby is born.   More likely to occur in women with:   Family history of diabetes.   Previous gestational diabetes.   Obese.   Over 32 years old.  WHAT IS PRE-DIABETES? Pre-diabetes means your blood glucose is higher than normal, but lower than the diabetes range. It also means you are at risk of getting type 2 diabetes and heart disease. If you are told you have  pre-diabetes, have your blood glucose checked again in 1 to 2 years. WHAT IS THE TREATMENT FOR DIABETES? Treatment is aimed at keeping blood glucose near normal levels at all times. Learning how to manage this yourself is important in treating diabetes. Depending on the type of diabetes you have, your treatment will include one or more of the following:  Monitoring your blood glucose.   Meal planning.   Exercise.   Oral medicine (pills) or insulin.  CAN DIABETES BE PREVENTED? With type 1 diabetes, prevention is more difficult, because the triggers that cause it are not yet known. With type 2 diabetes, prevention is more likely, with lifestyle changes:  Maintain a healthy weight.   Eat healthy.   Exercise.  IS THERE A CURE FOR DIABETES? No, there is no cure for diabetes. There is a lot of research going on that is looking for a cure, and progress is being made. Diabetes can be treated and controlled. People with diabetes can manage their diabetes and lead normal, active lives. SHOULD I BE TESTED FOR DIABETES? If you are at least 49 years old, you should be tested for diabetes. You should be tested again every 3 years. If you are 45 or older and overweight, you may want to get tested more often. If you are younger than 45, overweight, and have one or more of  the following risk factors, you should be tested:  Family history of diabetes.   Inactive lifestyle.   High blood pressure.  WHAT ARE SOME OTHER SOURCES FOR INFORMATION ON DIABETES? The following organizations may help in your search for more information on diabetes: National Diabetes Education Program (NDEP) Internet: SolarDiscussions.es American Diabetes Association Internet: http://www.diabetes.org  Juvenile Diabetes Foundation International Internet: WetlessWash.is Document Released: 11/02/2003 Document Revised: 10/19/2011 Document Reviewed: 08/27/2009 [facility] Patient Information 2012 [place],  [place].Hyperglycemia Hyperglycemia occurs when the glucose (sugar) in your blood is too high. Hyperglycemia can happen for many reasons, but it most often happens to people who do not know they have diabetes or are not managing their diabetes properly.  CAUSES  Whether you have diabetes or not, there are other causes of hyperglycemia. Hyperglycemia can occur when you have diabetes, but it can also occur in other situations that you might not be as aware of, such as: Diabetes  If you have diabetes and are having problems controlling your blood glucose, hyperglycemia could occur because of some of the following reasons:   Not following your meal plan.   Not taking your diabetes medications or not taking it properly.   Exercising less or doing less activity than you normally do.   Being sick.  Pre-diabetes  This cannot be ignored. Before people develop Type 2 diabetes, they almost always have "pre-diabetes." This is when your blood glucose levels are higher than normal, but not yet high enough to be diagnosed as diabetes. Research has shown that some long-term damage to the body, especially the heart and circulatory system, may already be occurring during pre-diabetes. If you take action to manage your blood glucose when you have pre-diabetes, you may delay or prevent Type 2 diabetes from developing.  Stress  If you have diabetes, you may be "diet" controlled or on oral medications or insulin to control your diabetes. However, you may find that your blood glucose is higher than usual in the hospital whether you have diabetes or not. This is often referred to as "stress hyperglycemia." Stress can elevate your blood glucose. This happens because of hormones put out by the body during times of stress. If stress has been the cause of your high blood glucose, it can be followed regularly by your caregiver. That way he/she can make sure your hyperglycemia does not continue to get worse or progress to  diabetes.  Steroids  Steroids are medications that act on the infection fighting system (immune system) to block inflammation or infection. One side effect can be a rise in blood glucose. Most people can produce enough extra insulin to allow for this rise, but for those who cannot, steroids make blood glucose levels go even higher. It is not unusual for steroid treatments to "uncover" diabetes that is developing. It is not always possible to determine if the hyperglycemia will go away after the steroids are stopped. A special blood test called an A1c is sometimes done to determine if your blood glucose was elevated before the steroids were started.  SYMPTOMS  Thirsty.   Frequent urination.   Dry mouth.   Blurred vision.   Tired or fatigue.   Weakness.   Sleepy.   Tingling in feet or leg.  DIAGNOSIS  Diagnosis is made by monitoring blood glucose in one or all of the following ways:  A1c test. This is a chemical found in your blood.   Fingerstick blood glucose monitoring.   Laboratory results.  TREATMENT  First, knowing the cause  of the hyperglycemia is important before the hyperglycemia can be treated. Treatment may include, but is not be limited to:  Education.   Change or adjustment in medications.   Change or adjustment in meal plan.   Treatment for an illness, infection, etc.   More frequent blood glucose monitoring.   Change in exercise plan.   Decreasing or stopping steroids.   Lifestyle changes.  HOME CARE INSTRUCTIONS   Test your blood glucose as directed.   Exercise regularly. Your caregiver will give you instructions about exercise. Pre-diabetes or diabetes which comes on with stress is helped by exercising.   Eat wholesome, balanced meals. Eat often and at regular, fixed times. Your caregiver or nutritionist will give you a meal plan to guide your sugar intake.   Being at an ideal weight is important. If needed, losing as little as 10 to 15 pounds may  help improve blood glucose levels.  SEEK MEDICAL CARE IF:   You have questions about medicine, activity, or diet.   You continue to have symptoms (problems such as increased thirst, urination, or weight gain).  SEEK IMMEDIATE MEDICAL CARE IF:   You are vomiting or have diarrhea.   Your breath smells fruity.   You are breathing faster or slower.   You are very sleepy or incoherent.   You have numbness, tingling, or pain in your feet or hands.   You have chest pain.   Your symptoms get worse even though you have been following your caregiver's orders.   If you have any other questions or concerns.  Document Released: 04/25/2001 Document Revised: 10/19/2011 Document Reviewed: 06/21/2009 [facility] Patient Information 2012 [place], [place].Urinary Tract Infection A urinary tract infection (UTI) is often caused by a germ (bacteria). A UTI is usually helped with medicine (antibiotics) that kills germs. Take all the medicine until it is gone. Do this even if you are feeling better. You are usually better in 7 to 10 days. HOME CARE   Drink enough water and fluids to keep your pee (urine) clear or pale yellow. Drink:   Cranberry juice.   Water.   Avoid:   Caffeine.   Tea.   Bubbly (carbonated) drinks.   Alcohol.   Only take medicine as told by your doctor.   To prevent further infections:   Pee often.   After pooping (bowel movement), women should wipe from front to back. Use each tissue only once.   Pee before and after having sex (intercourse).  Ask your doctor when your test results will be ready. Make sure you follow up and get your test results.  GET HELP RIGHT AWAY IF:   There is very bad back pain or lower belly (abdominal) pain.   You get the chills.   You have a fever.   Your baby is older than 3 months with a rectal temperature of 102 F (38.9 C) or higher.   Your baby is 52 months old or younger with a rectal temperature of 100.4 F (38 C) or higher.     You feel sick to your stomach (nauseous) or throw up (vomit).   There is continued burning with peeing.   Your problems are not better in 3 days. Return sooner if you are getting worse.  MAKE SURE YOU:   Understand these instructions.   Will watch your condition.   Will get help right away if you are not doing well or get worse.  Document Released: 04/17/2008 Document Revised: 10/19/2011 Document Reviewed: 04/17/2008 [employer]  Patient Information 2012 [employer]. 

## 2012-04-05 LAB — URINE CULTURE
Colony Count: >=100000
Culture  Setup Time: 201305230047

## 2012-04-06 ENCOUNTER — Encounter (HOSPITAL_COMMUNITY): Payer: Self-pay | Admitting: *Deleted

## 2012-04-06 ENCOUNTER — Emergency Department (HOSPITAL_COMMUNITY)
Admission: EM | Admit: 2012-04-06 | Discharge: 2012-04-06 | Disposition: A | Discharge status: Home / Self Care | Payer: Self-pay | Attending: Emergency Medicine | Admitting: Emergency Medicine

## 2012-04-06 ENCOUNTER — Emergency Department (HOSPITAL_COMMUNITY): Payer: Self-pay

## 2012-04-06 DIAGNOSIS — E162 Hypoglycemia, unspecified: Secondary | ICD-10-CM

## 2012-04-06 DIAGNOSIS — E1169 Type 2 diabetes mellitus with other specified complication: Secondary | ICD-10-CM | POA: Insufficient documentation

## 2012-04-06 DIAGNOSIS — R61 Generalized hyperhidrosis: Secondary | ICD-10-CM | POA: Insufficient documentation

## 2012-04-06 DIAGNOSIS — R5381 Other malaise: Secondary | ICD-10-CM | POA: Insufficient documentation

## 2012-04-06 LAB — BASIC METABOLIC PANEL
CO2: 27 mEq/L (ref 19–32)
Calcium: 9.7 mg/dL (ref 8.4–10.5)
Creatinine, Ser: 0.59 mg/dL (ref 0.50–1.10)
Glucose, Bld: 91 mg/dL (ref 70–99)

## 2012-04-06 LAB — DIFFERENTIAL
Eosinophils Relative: 0 % (ref 0–5)
Lymphocytes Relative: 29 % (ref 12–46)
Lymphs Abs: 1.4 10*3/uL (ref 0.7–4.0)
Monocytes Absolute: 0.3 10*3/uL (ref 0.1–1.0)

## 2012-04-06 LAB — URINALYSIS, ROUTINE W REFLEX MICROSCOPIC
Glucose, UA: 250 mg/dL — AB
Hgb urine dipstick: NEGATIVE
Ketones, ur: NEGATIVE mg/dL
Protein, ur: NEGATIVE mg/dL

## 2012-04-06 LAB — URINE MICROSCOPIC-ADD ON

## 2012-04-06 LAB — CBC
HCT: 39 % (ref 36.0–46.0)
MCV: 90.9 fL (ref 78.0–100.0)
RBC: 4.29 MIL/uL (ref 3.87–5.11)
WBC: 4.9 10*3/uL (ref 4.0–10.5)

## 2012-04-06 LAB — GLUCOSE, CAPILLARY
Glucose-Capillary: 104 mg/dL — ABNORMAL HIGH (ref 70–99)
Glucose-Capillary: 263 mg/dL — ABNORMAL HIGH (ref 70–99)

## 2012-04-06 MED ORDER — ACETAMINOPHEN 325 MG PO TABS
650.0000 mg | ORAL_TABLET | Freq: Once | ORAL | Status: AC
  Administered 2012-04-06: 650 mg via ORAL
  Filled 2012-04-06: qty 2

## 2012-04-06 MED ORDER — DEXTROSE 50 % IV SOLN
INTRAVENOUS | Status: AC
  Administered 2012-04-06: 14:00:00
  Filled 2012-04-06: qty 50

## 2012-04-06 NOTE — ED Notes (Signed)
+  Urine. Patient treated with Cipro. Sensitive to same. Per protocol MD. °

## 2012-04-06 NOTE — ED Provider Notes (Signed)
History     CSN: [id number]  Arrival date & time 04/06/12  1351   First MD Initiated Contact with Patient 04/06/12 1459      Chief Complaint  Patient presents with  . Hypoglycemia    (Consider location/radiation/quality/duration/timing/severity/associated sxs/prior treatment) The history is provided by the patient. No language interpreter was used.  patient has a history of insulin-dependent diabetes. She was recently seen in the emergency department was found have hyperglycemia. She was treated was eventually released home. She states she's continued to feel fatigued. She had worsening fatigue today with associated sweating at which time she felt her blood sugar was high. She did not check her blood sugar at this time. She rather took a dose of NovoLog. She then began feeling worse. EMS was called the patient was found lying in the living room unresponsive. Her blood sugar was 23. EMS gave an amp of D50 and she is now conscious and alert her baseline. She has no additional complaints.  Past Medical History  Diagnosis Date  . Diabetes mellitus     History reviewed. No pertinent past surgical history.  History reviewed. No pertinent family history.  History  Substance Use Topics  . Smoking status: Not on file  . Smokeless tobacco: Not on file  . Alcohol Use:     OB History    Grav Para Term Preterm Abortions TAB SAB Ect Mult Living                  Review of Systems  Constitutional: Positive for activity change and fatigue. Negative for fever, chills and appetite change.  HENT: Negative for congestion, sore throat, rhinorrhea, neck pain and neck stiffness.   Respiratory: Negative for cough and shortness of breath.   Cardiovascular: Negative for chest pain and palpitations.  Gastrointestinal: Negative for nausea, vomiting and abdominal pain.  Genitourinary: Negative for dysuria, urgency, frequency and flank pain.  Musculoskeletal: Negative for myalgias, back pain and  arthralgias.  Neurological: Positive for weakness (generalized). Negative for dizziness, light-headedness, numbness and headaches.  All other systems reviewed and are negative.    Allergies  Review of patient's allergies indicates no known allergies.  Home Medications   Current Outpatient Rx  Name Route Sig Dispense Refill  . CIPROFLOXACIN HCL 500 MG PO TABS Oral Take 500 mg by mouth 2 (two) times daily.    . INSULIN ASPART 100 UNIT/ML [place] SOLN Subcutaneous Inject 25 Units into the skin See admin instructions. When she feels her sugar is still to low    . INSULIN GLARGINE 100 UNIT/ML [place] SOLN Subcutaneous Inject 25 Units into the skin See admin instructions. She injects 25 units in the morning and 35 units at bedtime      BP 140/100  Pulse 71  Resp 16  SpO2 100%  Physical Exam  Nursing note and vitals reviewed. Constitutional: She is oriented to person, place, and time. She appears well-developed and well-nourished. No distress.  HENT:  Head: Normocephalic and atraumatic.  Mouth/Throat: Oropharynx is clear and moist. No oropharyngeal exudate.  Eyes: Conjunctivae and EOM are normal. Pupils are equal, round, and reactive to light.  Neck: Normal range of motion. Neck supple.  Cardiovascular: Normal rate, regular rhythm, normal heart sounds and intact distal pulses.  Exam reveals no gallop and no friction rub.   No murmur heard. Pulmonary/Chest: Effort normal and breath sounds normal. No respiratory distress. She exhibits no tenderness.  Abdominal: Soft. Bowel sounds are normal. There is no tenderness. There is  no rebound and no guarding.  Musculoskeletal: Normal range of motion. She exhibits no edema and no tenderness.  Neurological: She is alert and oriented to person, place, and time. No cranial nerve deficit.  Skin: Skin is warm and dry. No rash noted.    ED Course  Procedures (including critical care time)  Labs Reviewed  GLUCOSE, CAPILLARY - Abnormal; Notable for the  following:    Glucose-Capillary 104 (*)    All other components within normal limits  GLUCOSE, CAPILLARY - Abnormal; Notable for the following:    Glucose-Capillary 263 (*)    All other components within normal limits  GLUCOSE, CAPILLARY  CBC  DIFFERENTIAL  BASIC METABOLIC PANEL  URINALYSIS, ROUTINE W REFLEX MICROSCOPIC   Dg Chest 2 View  04/06/2012  *RADIOLOGY REPORT*  Clinical Data: Hypoglycemia.  CHEST - 2 VIEW  Comparison: None.  Findings: Cardiomediastinal silhouette is within normal limits. The lungs are free of focal consolidations and pleural effusions. There is no pulmonary edema. Visualized osseous structures have a normal appearance.  IMPRESSION: No evidence for acute for pulmonary abnormality.  Original Report Authenticated By: Patterson Hammersmith, M.D.     1. Hypoglycemia       MDM  Hypoglycemia secondary to taking insulin without checking her blood sugar. She had symptoms of hypoglycemia but did not check her sugar. She received an amp of D50 and 8 with improvement of her symptoms and blood sugar. This was monitored for proximally 6 hours emergency department. Remained stable. Discharged home with instructions to check her sugar at least twice more times before bed.  Refrain from lantus tonight        Dayton Bailiff, MD 04/06/12 1920

## 2012-04-06 NOTE — ED Notes (Signed)
EMS called to home.  Found patient in living room floor and unresponsive Blood sugar was 23.  EMS gave amp of D50.  Patient is now conscious and alert V/S stable.

## 2012-04-06 NOTE — Discharge Instructions (Signed)

## 2012-04-06 NOTE — ED Notes (Signed)
Pt's family member asked if we could give her a glucose monitor to take home bc pt doesn't have one at home.  I informed Nurse Britt Boozer of their concerns.

## 2012-04-06 NOTE — ED Notes (Signed)
Pt reminded that we need urine. 

## 2012-04-06 NOTE — ED Notes (Signed)
Pt states that she woke up and felt bad this morning.  She felt like her sugar was low.  When my sugar feels low, I take my novolog.  So that's what I did."  This writer informed the pt that novolog actually drops glucose.  "Well when I feel bad, that's what I do."

## 2012-04-06 NOTE — ED Notes (Signed)
Much education given regarding low blood sugars.  Pt states felt sugar was low and then she took her novolog. Also pt not monitoring sugars at home.  Encouraged pt to see Endocrinologist at earliest convenience.  Explained that novolog is short acting and never to be used to treat low sugar.  Family at bedside during instruction.  Encouraged glucose tabs/gel for tx and to keep with patient.  Explained tx to family with gel in an unconscious state.  Pt d/c home.

## 2012-04-17 ENCOUNTER — Encounter (HOSPITAL_COMMUNITY): Payer: Self-pay | Admitting: *Deleted

## 2012-07-31 ENCOUNTER — Encounter (HOSPITAL_COMMUNITY): Payer: Self-pay

## 2012-07-31 ENCOUNTER — Inpatient Hospital Stay (HOSPITAL_COMMUNITY)
Admission: EM | Admit: 2012-07-31 | Discharge: 2012-08-28 | DRG: 566 | Disposition: A | Discharge status: Skilled Nursing Facility | Payer: BC Managed Care – PPO | Attending: Internal Medicine | Admitting: Internal Medicine

## 2012-07-31 ENCOUNTER — Emergency Department (HOSPITAL_COMMUNITY): Payer: BC Managed Care – PPO

## 2012-07-31 DIAGNOSIS — E11621 Type 2 diabetes mellitus with foot ulcer: Secondary | ICD-10-CM | POA: Diagnosis present

## 2012-07-31 DIAGNOSIS — K3184 Gastroparesis: Secondary | ICD-10-CM | POA: Diagnosis not present

## 2012-07-31 DIAGNOSIS — I951 Orthostatic hypotension: Secondary | ICD-10-CM | POA: Diagnosis not present

## 2012-07-31 DIAGNOSIS — Z794 Long term (current) use of insulin: Secondary | ICD-10-CM

## 2012-07-31 DIAGNOSIS — E1143 Type 2 diabetes mellitus with diabetic autonomic (poly)neuropathy: Secondary | ICD-10-CM

## 2012-07-31 DIAGNOSIS — E876 Hypokalemia: Secondary | ICD-10-CM | POA: Diagnosis present

## 2012-07-31 DIAGNOSIS — I10 Essential (primary) hypertension: Secondary | ICD-10-CM | POA: Diagnosis not present

## 2012-07-31 DIAGNOSIS — I959 Hypotension, unspecified: Secondary | ICD-10-CM | POA: Diagnosis not present

## 2012-07-31 DIAGNOSIS — D72829 Elevated white blood cell count, unspecified: Secondary | ICD-10-CM | POA: Diagnosis present

## 2012-07-31 DIAGNOSIS — L97309 Non-pressure chronic ulcer of unspecified ankle with unspecified severity: Secondary | ICD-10-CM | POA: Diagnosis present

## 2012-07-31 DIAGNOSIS — K7200 Acute and subacute hepatic failure without coma: Secondary | ICD-10-CM | POA: Diagnosis not present

## 2012-07-31 DIAGNOSIS — E1169 Type 2 diabetes mellitus with other specified complication: Principal | ICD-10-CM | POA: Diagnosis present

## 2012-07-31 DIAGNOSIS — E1149 Type 2 diabetes mellitus with other diabetic neurological complication: Secondary | ICD-10-CM | POA: Diagnosis not present

## 2012-07-31 DIAGNOSIS — R748 Abnormal levels of other serum enzymes: Secondary | ICD-10-CM

## 2012-07-31 DIAGNOSIS — K828 Other specified diseases of gallbladder: Secondary | ICD-10-CM | POA: Diagnosis present

## 2012-07-31 DIAGNOSIS — E119 Type 2 diabetes mellitus without complications: Secondary | ICD-10-CM | POA: Diagnosis present

## 2012-07-31 DIAGNOSIS — R112 Nausea with vomiting, unspecified: Secondary | ICD-10-CM | POA: Diagnosis not present

## 2012-07-31 DIAGNOSIS — Z833 Family history of diabetes mellitus: Secondary | ICD-10-CM

## 2012-07-31 DIAGNOSIS — A419 Sepsis, unspecified organism: Secondary | ICD-10-CM | POA: Diagnosis not present

## 2012-07-31 DIAGNOSIS — D649 Anemia, unspecified: Secondary | ICD-10-CM

## 2012-07-31 DIAGNOSIS — L0390 Cellulitis, unspecified: Secondary | ICD-10-CM | POA: Diagnosis present

## 2012-07-31 DIAGNOSIS — Z8249 Family history of ischemic heart disease and other diseases of the circulatory system: Secondary | ICD-10-CM

## 2012-07-31 DIAGNOSIS — D509 Iron deficiency anemia, unspecified: Secondary | ICD-10-CM | POA: Diagnosis present

## 2012-07-31 DIAGNOSIS — L02619 Cutaneous abscess of unspecified foot: Secondary | ICD-10-CM | POA: Diagnosis present

## 2012-07-31 DIAGNOSIS — L97509 Non-pressure chronic ulcer of other part of unspecified foot with unspecified severity: Secondary | ICD-10-CM

## 2012-07-31 HISTORY — DX: Non-pressure chronic ulcer of unspecified part of unspecified lower leg with unspecified severity: L97.909

## 2012-07-31 LAB — CBC WITH DIFFERENTIAL/PLATELET
Basophils Absolute: 0 10*3/uL (ref 0.0–0.1)
Basophils Relative: 0 % (ref 0–1)
Eosinophils Absolute: 0.1 10*3/uL (ref 0.0–0.7)
HCT: 28.3 % — ABNORMAL LOW (ref 36.0–46.0)
Hemoglobin: 9.8 g/dL — ABNORMAL LOW (ref 12.0–15.0)
MCH: 30.8 pg (ref 26.0–34.0)
MCHC: 34.6 g/dL (ref 30.0–36.0)
Monocytes Absolute: 1.5 10*3/uL — ABNORMAL HIGH (ref 0.1–1.0)
Monocytes Relative: 10 % (ref 3–12)
Neutro Abs: 10.2 10*3/uL — ABNORMAL HIGH (ref 1.7–7.7)
RDW: 12 % (ref 11.5–15.5)

## 2012-07-31 LAB — BASIC METABOLIC PANEL
BUN: 10 mg/dL (ref 6–23)
Calcium: 9.2 mg/dL (ref 8.4–10.5)
Chloride: 95 mEq/L — ABNORMAL LOW (ref 96–112)
Creatinine, Ser: 0.69 mg/dL (ref 0.50–1.10)
GFR calc Af Amer: >90 mL/min (ref >90)
GFR calc non Af Amer: >90 mL/min (ref >90)

## 2012-07-31 MED ORDER — PIPERACILLIN-TAZOBACTAM 3.375 G IVPB
3.3750 g | Freq: Once | INTRAVENOUS | Status: AC
  Administered 2012-07-31: 3.375 g via INTRAVENOUS
  Filled 2012-07-31: qty 50

## 2012-07-31 MED ORDER — MORPHINE SULFATE 4 MG/ML IJ SOLN
4.0000 mg | Freq: Once | INTRAMUSCULAR | Status: AC
  Administered 2012-07-31: 4 mg via INTRAVENOUS
  Filled 2012-07-31: qty 1

## 2012-07-31 NOTE — ED Notes (Signed)
Pt back from X-Ray.  

## 2012-07-31 NOTE — ED Notes (Signed)
Pt present with wound on bottom of foot that she clipped with a nail clipper 2 weeks ago and has not healed since. Ankle up to calf area swollen. Hx of diabetes, taking lantus 25 units at night and daytime. Sock has drainage on it, no bleeding present.

## 2012-07-31 NOTE — ED Provider Notes (Signed)
History     CSN: [id number]  Arrival date & time 07/31/12  4098   First MD Initiated Contact with Patient 07/31/12 2049      Chief Complaint  Patient presents with  . Wound Check     HPI Patient presents to the emergency room with complaints of an infection on the bottom of her foot.  She used a nail clipper 2 weeks ago to trim a callus on the bottom of her foot. Since that time she started developing some swelling and pain in the foot extending up towards her ankle and calf. Patient today he also noticed that she had drainage around her sock. She has also felt hot and feverish. She has not had any nausea or vomiting. Patient does have history of diabetes.  She has not seen anyone to evaluate this until this evening   Past Medical History  Diagnosis Date  . Diabetes mellitus     History reviewed. No pertinent past surgical history.  History reviewed. No pertinent family history.  History  Substance Use Topics  . Smoking status: Never Smoker   . Smokeless tobacco: Not on file  . Alcohol Use: No    OB History    Grav Para Term Preterm Abortions TAB SAB Ect Mult Living                  Review of Systems  Musculoskeletal: Negative for joint swelling.  All other systems reviewed and are negative.    Allergies  Review of patient's allergies indicates no known allergies.  Home Medications   Current Outpatient Rx  Name Route Sig Dispense Refill  . INSULIN GLARGINE 100 UNIT/ML [place] SOLN Subcutaneous Inject 25 Units into the skin 2 (two) times daily.     Marland Kitchen CIPROFLOXACIN HCL 500 MG PO TABS Oral Take 500 mg by mouth 2 (two) times daily.      BP 132/76  Pulse 100  Temp 99.1 F (37.3 C) (Oral)  Resp 14  Ht 5\' 8"  (1.727 m)  Wt 156 lb (70.761 kg)  BMI 23.72 kg/m2  SpO2 95%  LMP 02/09/2011  Physical Exam  Nursing note and vitals reviewed. Constitutional: She appears well-developed and well-nourished. No distress.  HENT:  Head: Normocephalic and atraumatic.  Right  Ear: External ear normal.  Left Ear: External ear normal.  Eyes: Conjunctivae normal are normal. Right eye exhibits no discharge. Left eye exhibits no discharge. No scleral icterus.  Neck: Neck supple. No tracheal deviation present.  Cardiovascular: Normal rate, regular rhythm and intact distal pulses.   Pulmonary/Chest: Effort normal and breath sounds normal. No stridor. No respiratory distress. She has no wheezes. She has no rales.  Abdominal: Soft. Bowel sounds are normal. She exhibits no distension. There is no tenderness. There is no rebound and no guarding.  Musculoskeletal: She exhibits edema and tenderness.       White calloused wound overlying the first metatarsal head the right plantar aspect of her foot, no gross purulent drainage with palpation of the wound margins, erythema and increased warmth of her foot extending towards the calf, tenderness palpation, distal cap refill brisk  Neurological: She is alert. She has normal strength. No sensory deficit. Cranial nerve deficit:  no gross defecits noted. She exhibits normal muscle tone. She displays no seizure activity. Coordination normal.  Skin: Skin is warm and dry. No rash noted.  Psychiatric: She has a normal mood and affect.    ED Course  Procedures (including critical care time)  Labs Reviewed  CBC WITH DIFFERENTIAL - Abnormal; Notable for the following:    WBC 14.6 (*)     RBC 3.18 (*)     Hemoglobin 9.8 (*)     HCT 28.3 (*)     Neutro Abs 10.2 (*)     Monocytes Absolute 1.5 (*)     All other components within normal limits  BASIC METABOLIC PANEL - Abnormal; Notable for the following:    Sodium 132 (*)     Chloride 95 (*)     Glucose, Bld 110 (*)     All other components within normal limits   Dg Foot Complete Right  07/31/2012  *RADIOLOGY REPORT*  Clinical Data: Ulceration on the plantar surface of the foot.  RIGHT FOOT COMPLETE - 3+ VIEW  Comparison: None.  Findings: Soft tissues of the foot are diffusely swollen.   Plantar skin ulcer is identified.  No bony destructive change, radiopaque foreign body or soft tissue gas collection is identified.  There is no fracture or dislocation.  IMPRESSION: Soft tissue swelling and skin ulceration without evidence of osteomyelitis.   Original Report Authenticated By: Bernadene Bell. D'ALESSIO, M.D.      1. Cellulitis   2. Diabetic foot ulcer   3. Anemia       MDM  Patient has a diabetic foot ulcer associated with cellulitis. X-ray does not suggest osteomyelitis. With her diabetes, feel the patient would benefit from inpatient IV antibiotic treatment. She would also benefit from treatment for the wound care center. I will consult the hospitalist for admission for continued IV abx treatment        Celene Kras, MD 08/01/12 [phone]

## 2012-08-01 ENCOUNTER — Inpatient Hospital Stay (HOSPITAL_COMMUNITY): Payer: BC Managed Care – PPO

## 2012-08-01 ENCOUNTER — Encounter (HOSPITAL_COMMUNITY): Payer: Self-pay | Admitting: Internal Medicine

## 2012-08-01 DIAGNOSIS — D72829 Elevated white blood cell count, unspecified: Secondary | ICD-10-CM | POA: Diagnosis present

## 2012-08-01 DIAGNOSIS — L0390 Cellulitis, unspecified: Secondary | ICD-10-CM | POA: Diagnosis present

## 2012-08-01 DIAGNOSIS — E119 Type 2 diabetes mellitus without complications: Secondary | ICD-10-CM | POA: Diagnosis present

## 2012-08-01 DIAGNOSIS — E11621 Type 2 diabetes mellitus with foot ulcer: Secondary | ICD-10-CM | POA: Diagnosis present

## 2012-08-01 DIAGNOSIS — L97509 Non-pressure chronic ulcer of other part of unspecified foot with unspecified severity: Secondary | ICD-10-CM

## 2012-08-01 DIAGNOSIS — E1169 Type 2 diabetes mellitus with other specified complication: Principal | ICD-10-CM

## 2012-08-01 LAB — HEMOGLOBIN A1C
Hgb A1c MFr Bld: 6.7 % — ABNORMAL HIGH (ref <5.7)
Mean Plasma Glucose: 146 mg/dL — ABNORMAL HIGH (ref <117)

## 2012-08-01 LAB — GLUCOSE, CAPILLARY
Glucose-Capillary: 127 mg/dL — ABNORMAL HIGH (ref 70–99)
Glucose-Capillary: 105 mg/dL — ABNORMAL HIGH (ref 70–99)
Glucose-Capillary: 164 mg/dL — ABNORMAL HIGH (ref 70–99)
Glucose-Capillary: 43 mg/dL — CL (ref 70–99)
Glucose-Capillary: 84 mg/dL (ref 70–99)
Glucose-Capillary: 93 mg/dL (ref 70–99)

## 2012-08-01 LAB — CBC
Hemoglobin: 9 g/dL — ABNORMAL LOW (ref 12.0–15.0)
MCH: 30.4 pg (ref 26.0–34.0)
RBC: 2.96 MIL/uL — ABNORMAL LOW (ref 3.87–5.11)
WBC: 10.7 10*3/uL — ABNORMAL HIGH (ref 4.0–10.5)

## 2012-08-01 LAB — BASIC METABOLIC PANEL
CO2: 28 mEq/L (ref 19–32)
Chloride: 99 mEq/L (ref 96–112)
Glucose, Bld: 131 mg/dL — ABNORMAL HIGH (ref 70–99)
Potassium: 3.2 mEq/L — ABNORMAL LOW (ref 3.5–5.1)
Sodium: 135 mEq/L (ref 135–145)

## 2012-08-01 LAB — FERRITIN: Ferritin: 143 ng/mL (ref 10–291)

## 2012-08-01 MED ORDER — ZOLPIDEM TARTRATE 5 MG PO TABS
5.0000 mg | ORAL_TABLET | Freq: Every evening | ORAL | Status: DC | PRN
  Administered 2012-08-02 – 2012-08-10 (×3): 5 mg via ORAL
  Filled 2012-08-01 (×3): qty 1

## 2012-08-01 MED ORDER — ACETAMINOPHEN 325 MG PO TABS
ORAL_TABLET | ORAL | Status: AC
  Administered 2012-08-01: 650 mg via ORAL
  Filled 2012-08-01: qty 2

## 2012-08-01 MED ORDER — ACETAMINOPHEN 325 MG PO TABS
650.0000 mg | ORAL_TABLET | Freq: Four times a day (QID) | ORAL | Status: DC | PRN
  Administered 2012-08-01 – 2012-08-05 (×7): 650 mg via ORAL
  Filled 2012-08-01 (×6): qty 2

## 2012-08-01 MED ORDER — SODIUM CHLORIDE 0.9 % IV SOLN
INTRAVENOUS | Status: DC
  Administered 2012-08-01 – 2012-08-04 (×4): via INTRAVENOUS
  Administered 2012-08-04: 75 mL/h via INTRAVENOUS
  Administered 2012-08-05: 1000 mL via INTRAVENOUS
  Administered 2012-08-06 – 2012-08-07 (×4): via INTRAVENOUS
  Administered 2012-08-07: 1000 mL via INTRAVENOUS
  Administered 2012-08-07: 05:00:00 via INTRAVENOUS

## 2012-08-01 MED ORDER — INSULIN ASPART 100 UNIT/ML ~~LOC~~ SOLN
0.0000 [IU] | Freq: Three times a day (TID) | SUBCUTANEOUS | Status: DC
  Administered 2012-08-01 – 2012-08-03 (×2): 3 [IU] via SUBCUTANEOUS
  Administered 2012-08-03: 2 [IU] via SUBCUTANEOUS
  Administered 2012-08-04 – 2012-08-06 (×5): 3 [IU] via SUBCUTANEOUS
  Administered 2012-08-06: 2 [IU] via SUBCUTANEOUS
  Administered 2012-08-06: 5 [IU] via SUBCUTANEOUS
  Administered 2012-08-09 – 2012-08-10 (×3): 3 [IU] via SUBCUTANEOUS
  Administered 2012-08-10: 5 [IU] via SUBCUTANEOUS
  Administered 2012-08-11: 3 [IU] via SUBCUTANEOUS
  Administered 2012-08-11 – 2012-08-12 (×2): 5 [IU] via SUBCUTANEOUS
  Administered 2012-08-12: 2 [IU] via SUBCUTANEOUS
  Administered 2012-08-12: 3 [IU] via SUBCUTANEOUS
  Administered 2012-08-13: 2 [IU] via SUBCUTANEOUS
  Administered 2012-08-17: 1 [IU] via SUBCUTANEOUS
  Administered 2012-08-19 – 2012-08-23 (×9): 2 [IU] via SUBCUTANEOUS
  Administered 2012-08-24: 3 [IU] via SUBCUTANEOUS
  Administered 2012-08-24 – 2012-08-26 (×5): 2 [IU] via SUBCUTANEOUS
  Administered 2012-08-26 – 2012-08-27 (×2): 5 [IU] via SUBCUTANEOUS
  Administered 2012-08-27 – 2012-08-28 (×3): 2 [IU] via SUBCUTANEOUS

## 2012-08-01 MED ORDER — INSULIN GLARGINE 100 UNIT/ML ~~LOC~~ SOLN
25.0000 [IU] | Freq: Two times a day (BID) | SUBCUTANEOUS | Status: DC
  Administered 2012-08-01 – 2012-08-03 (×5): 25 [IU] via SUBCUTANEOUS
  Filled 2012-08-01: qty 1

## 2012-08-01 MED ORDER — PIPERACILLIN-TAZOBACTAM 3.375 G IVPB
3.3750 g | Freq: Three times a day (TID) | INTRAVENOUS | Status: DC
  Administered 2012-08-01 – 2012-08-10 (×28): 3.375 g via INTRAVENOUS
  Filled 2012-08-01 (×30): qty 50

## 2012-08-01 MED ORDER — INFLUENZA VIRUS VACC SPLIT PF IM SUSP
0.5000 mL | Freq: Once | INTRAMUSCULAR | Status: AC
  Administered 2012-08-01: 0.5 mL via INTRAMUSCULAR
  Filled 2012-08-01: qty 0.5

## 2012-08-01 MED ORDER — HEPARIN SODIUM (PORCINE) 5000 UNIT/ML IJ SOLN
5000.0000 [IU] | Freq: Three times a day (TID) | INTRAMUSCULAR | Status: DC
  Administered 2012-08-01 – 2012-08-28 (×82): 5000 [IU] via SUBCUTANEOUS
  Filled 2012-08-01 (×86): qty 1

## 2012-08-01 MED ORDER — PNEUMOCOCCAL VAC POLYVALENT 25 MCG/0.5ML IJ INJ
0.5000 mL | INJECTION | Freq: Once | INTRAMUSCULAR | Status: AC
  Administered 2012-08-01: 0.5 mL via INTRAMUSCULAR
  Filled 2012-08-01: qty 0.5

## 2012-08-01 NOTE — Consult Note (Signed)
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   Dressing procedure/placement/frequency:patient is scheduled for MRI today.  I have instituted saline dressings twice daily in an attempt to clean and draw exudate at this time.  Further consultation by orthopedics may be indicated pending results of MRI.If you agree, please order. I will not follow.  Please re-consult if needed. Thanks, Ladona Mow, MSN, RN, [facility], CWOCN [phone])

## 2012-08-01 NOTE — ED Notes (Signed)
Attempted to give report. Kara Mead, RN busy.

## 2012-08-01 NOTE — Progress Notes (Signed)
Called ED and received report from [place], Charity fundraiser. Awaiting pt's arrival.

## 2012-08-01 NOTE — Progress Notes (Signed)
Hypoglycemic Event  CBG: 43  Treatment: 15 GM carbohydrate snack  Symptoms: Sweaty, Shaky and Nervous/irritable  Follow-up CBG: Time:1725 CBG Result:84  Possible Reasons for Event: Inadequate meal intake and Medication regimen:   Comments/MD notified:Rechecked at 1810. CBG 93    Jennifer [name]  Remember to initiate Hypoglycemia Order Set & complete

## 2012-08-01 NOTE — Progress Notes (Signed)
[facility] PROGRESS NOTE  Jennifer [name] ZOX:[id number] DOB: 1960-12-04 DOA: 07/31/2012 PCP: No primary provider on file.  I have seen and examined patient-[name] will per 51 year old with diabetes mellitus admitted this a.m. with diabetic foot ulcer with cellulitis. She denies any new complaints at this time. MRI is pending as well as wound care consult-will follow and further manage as clinically appropriate, and now continue current management as per Dr. Julian Reil.   [name]  [facility] Pager [phone]. If 8PM-8AM, please contact night-coverage at [email or web address], password [facility] 08/01/2012, 10:20 AM  LOS: 1 day

## 2012-08-01 NOTE — H&P (Addendum)
[facility] History and Physical  Jennifer [name] ZOX:[id number] DOB: May 13, 1961 DOA: 07/31/2012  Referring physician: ED PCP: No primary provider on file.   Chief Complaint: Diabetic foot ulcer with celulitus  HPI: Jennifer [name] is a 51 y.o. female with PMH of DM2 diagnosed 13 years ago (does not know her A1C), who tried to cut what she thought was a callus off of her foot 2 weeks ago.  She thought nothing of it until 2 days ago when her foot and ankle began to swell up.  Finally today she noticed drainage through her sock and a very foul smell when she took her sock off.  This prompted the patient to present to the ED for treatment.  Evaluation in the ED demonstrated a non painful diabetic foot ulcer with purulent drainage and obvious edema of leg and erythema around ulcer.  Lab work up showed leukocytosis, X-ray of foot didn't show any obvious osteomylitis, patient started on zosyn hospitalist has been asked to admit for IV antibiotics.  Review of Systems: Patient admits to fever, chills, denies nausea, vomiting, diarrhea, abdominal pain, foot pain including at the site of the ulcer, 12 systems reviewed and negative except as per HPI.  Past Medical History  Diagnosis Date  . Diabetes mellitus    History reviewed. No pertinent past surgical history. Social History:  reports that she has never smoked. She does not have any smokeless tobacco history on file. She reports that she does not drink alcohol or use illicit drugs. Lives at home performs all ADLs  No Known Allergies  Family History  Problem Relation Age of Onset  . Diabetes Mother     Prior to Admission medications   Medication Sig Start Date End Date Taking? Authorizing Provider  insulin glargine (LANTUS) 100 UNIT/ML injection Inject 25 Units into the skin 2 (two) times daily.    Yes Historical Provider, MD  ciprofloxacin (CIPRO) 500 MG tablet Take 500 mg by mouth 2 (two) times daily.    Historical Provider, MD    Physical Exam: Filed Vitals:   07/31/12 2000  BP: 132/76  Pulse: 100  Temp: 99.1 F (37.3 C)  TempSrc: Oral  Resp: 14  Height: 5\' 8"  (1.727 m)  Weight: 70.761 kg (156 lb)  SpO2: 95%     General:  NAD, resting comfortably in hospital bed  Eyes: PEERLA EOMI  ENT: moist mucous membranes  Neck: supple, w/o JVD  Cardiovascular: RRR w/o MRG  Respiratory: CTA B  Abdomen: soft, nt, nd, normoactive bowelsounds  Skin: the patient has a very classic appearing diabetic foot ulcer on the ball of her RLE foot just proximal to the great toe.  The patient has no other ulcers or skin lesions on either foot other than this single ulcer, the ulcer has an open area about 1 cm across, the ulcer is associated with mild erythema surrounding it but fairly severe edema extending up her ankle and lower leg.  Psychiatric: Patient is obviously concerned about the ulcer and knows that diabetic foot ulcers can lead to complications including amputations  Neurologic: obvious diminished sensation over BLE feet c/w diabetic neuropathy.  Labs on Admission:  Basic Metabolic Panel:  Lab 07/31/12 8119  NA 132*  K 3.5  CL 95*  CO2 25  GLUCOSE 110*  BUN 10  CREATININE 0.69  CALCIUM 9.2  MG --  PHOS --   Liver Function Tests: No results found for this basename: AST:5,ALT:5,ALKPHOS:5,BILITOT:5,PROT:5,ALBUMIN:5 in the last 168 hours No results found  for this basename: LIPASE:5,AMYLASE:5 in the last 168 hours No results found for this basename: AMMONIA:5 in the last 168 hours CBC:  Lab 07/31/12 2230  WBC 14.6*  NEUTROABS 10.2*  HGB 9.8*  HCT 28.3*  MCV 89.0  PLT 306   Cardiac Enzymes: No results found for this basename: CKTOTAL:5,CKMB:5,CKMBINDEX:5,TROPONINI:5 in the last 168 hours  BNP (last 3 results) No results found for this basename: PROBNP:3 in the last 8760 hours CBG: No results found for this basename: GLUCAP:5 in the last 168 hours  Radiological Exams on Admission: Dg Foot  Complete Right  07/31/2012  *RADIOLOGY REPORT*  Clinical Data: Ulceration on the plantar surface of the foot.  RIGHT FOOT COMPLETE - 3+ VIEW  Comparison: None.  Findings: Soft tissues of the foot are diffusely swollen.  Plantar skin ulcer is identified.  No bony destructive change, radiopaque foreign body or soft tissue gas collection is identified.  There is no fracture or dislocation.  IMPRESSION: Soft tissue swelling and skin ulceration without evidence of osteomyelitis.   Original Report Authenticated By: Bernadene Bell. Maricela Curet, M.D.       Assessment/Plan Principal Problem:  *Diabetic foot ulcer Active Problems:  DM2 (diabetes mellitus, type 2)  Leukocytosis  Cellulitis   1. Diabetic foot ulcer - new diagnosis, first foot ulcer this patient has had, located on her R foot as described in physical exam.  Appears to have an associated cellulitis and evidence of infection including foul smelling drainage and leukocytosis.  Will put patient on zosyn empirically given risk of pseudomonas with diabetic foot ulcers.  No evidence on plain film of osteomyelitis, but will order MRI of foot to rule this out.  Will order wound culture although this is somewhat lower yield for getting an accurate idea of what the causative organism is. 2. Leukocytosis - no other SIRS criteria to make the diagnosis of sepsis at this time, however if a fever is documented during her stay this becomes a diagnosis of sepsis.  Trend WBC with daily CBCs. 3. DM2 - will check A1C, order home lantus (25 units BID so will just order 50 units once daily), also order SSI med dose.  Suspect poor control given that patient dosent know her A1C and the development of #1.  Will also order a urine for micro albumin while patient is here, need to schedule diabetic eye exam as outpatient. 4. Cellulitis - secondary to #1, treatment as per #1 5. Anemia - unclear etiology, suspect may be related to foot ulcer, monitor with daily CBCs, have ordered  iron studies as well.  Code Status: Full Code Family Communication: No family present in room at this time Disposition Plan: Admit to inpatient  Time spent: 70 min  GARDNER, JARED M. [facility] Pager [phone]  If 7PM-7AM, please contact night-coverage [email or web address] Password [email or web address] 08/01/2012, 1:31 AM

## 2012-08-02 DIAGNOSIS — D72829 Elevated white blood cell count, unspecified: Secondary | ICD-10-CM

## 2012-08-02 LAB — GLUCOSE, CAPILLARY
Glucose-Capillary: 157 mg/dL — ABNORMAL HIGH (ref 70–99)
Glucose-Capillary: 170 mg/dL — ABNORMAL HIGH (ref 70–99)
Glucose-Capillary: 85 mg/dL (ref 70–99)
Glucose-Capillary: 116 mg/dL — ABNORMAL HIGH (ref 70–99)

## 2012-08-02 LAB — CBC
HCT: 27 % — ABNORMAL LOW (ref 36.0–46.0)
MCH: 30.1 pg (ref 26.0–34.0)
MCV: 89.4 fL (ref 78.0–100.0)
Platelets: 281 10*3/uL (ref 150–400)
RDW: 12.1 % (ref 11.5–15.5)

## 2012-08-02 MED ORDER — OXYCODONE-ACETAMINOPHEN 5-325 MG PO TABS
1.0000 | ORAL_TABLET | ORAL | Status: DC | PRN
  Administered 2012-08-02 – 2012-08-04 (×2): 1 via ORAL
  Administered 2012-08-04 – 2012-08-07 (×4): 2 via ORAL
  Filled 2012-08-02 (×4): qty 2
  Filled 2012-08-02 (×2): qty 1

## 2012-08-02 MED ORDER — VANCOMYCIN HCL IN DEXTROSE 1-5 GM/200ML-% IV SOLN
1000.0000 mg | Freq: Two times a day (BID) | INTRAVENOUS | Status: DC
  Administered 2012-08-02 – 2012-08-07 (×11): 1000 mg via INTRAVENOUS
  Filled 2012-08-02 (×12): qty 200

## 2012-08-02 NOTE — Consult Note (Signed)
Reason for Consult:   Right foot swelling Referring Physician:    Dr. 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 female  With 10 year history of IDDM.  Admits to not monitoring things very closely but has had some drainage from right foot recently.  Noted to have neuropathy for some time.  Admitted recently by medicine and ORS consulted now about need for surgery on foot I suspect.    Past Medical History  Diagnosis Date  . Diabetes mellitus     History reviewed. No pertinent past surgical history.  Family History  Problem Relation Age of Onset  . Diabetes Mother     Social History:  reports that she has never smoked. She does not have any smokeless tobacco history on file. She reports that she does not drink alcohol or use illicit drugs.  Allergies: No Known Allergies  Medications: I have reviewed the patient's current medications.  Results for orders placed during the hospital encounter of 07/31/12 (from the past 48 hour(s))  CBC WITH DIFFERENTIAL     Status: Abnormal   Collection Time   07/31/12 10:30 PM      Component Value Range Comment   WBC 14.6 (*) 4.0 - 10.5 K/uL    RBC 3.18 (*) 3.87 - 5.11 MIL/uL    Hemoglobin 9.8 (*) 12.0 - 15.0 g/dL    HCT 16.1 (*) 09.6 - 46.0 %    MCV 89.0  78.0 - 100.0 fL    MCH 30.8  26.0 - 34.0 pg    MCHC 34.6  30.0 - 36.0 g/dL    RDW 04.5  40.9 - 81.1 %    Platelets 306  150 - 400 K/uL    Neutrophils Relative 70  43 - 77 %    Neutro Abs 10.2 (*) 1.7 - 7.7 K/uL    Lymphocytes Relative 19  12 - 46 %    Lymphs Abs 2.8  0.7 - 4.0 K/uL    Monocytes Relative 10  3 - 12 %    Monocytes Absolute 1.5 (*) 0.1 - 1.0 K/uL    Eosinophils Relative 1  0 - 5 %    Eosinophils Absolute 0.1  0.0 - 0.7 K/uL    Basophils Relative 0  0 - 1 %    Basophils Absolute 0.0  0.0 - 0.1 K/uL   BASIC METABOLIC PANEL     Status: Abnormal   Collection Time   07/31/12 10:30 PM      Component Value Range Comment   Sodium 132 (*) 135 - 145 mEq/L    Potassium 3.5  3.5 - 5.1  mEq/L    Chloride 95 (*) 96 - 112 mEq/L    CO2 25  19 - 32 mEq/L    Glucose, Bld 110 (*) 70 - 99 mg/dL    BUN 10  6 - 23 mg/dL    Creatinine, Ser 9.14  0.50 - 1.10 mg/dL    Calcium 9.2  8.4 - 78.2 mg/dL    GFR calc non Af Amer >90  >90 mL/min    GFR calc Af Amer >90  >90 mL/min   HEMOGLOBIN A1C     Status: Abnormal   Collection Time   07/31/12 10:30 PM      Component Value Range Comment   Hemoglobin A1C 6.7 (*) <5.7 %    Mean Plasma Glucose 146 (*) <117 mg/dL   IRON AND TIBC     Status: Abnormal   Collection Time   07/31/12  10:30 PM      Component Value Range Comment   Iron <10 (*) 42 - 135 ug/dL    TIBC Not calculated due to Iron <10.  250 - 470 ug/dL    Saturation Ratios Not calculated due to Iron <10.  20 - 55 %    UIBC 241  125 - 400 ug/dL   FERRITIN     Status: Normal   Collection Time   07/31/12 10:30 PM      Component Value Range Comment   Ferritin 143  10 - 291 ng/mL   GLUCOSE, CAPILLARY     Status: Abnormal   Collection Time   08/01/12  2:10 AM      Component Value Range Comment   Glucose-Capillary 127 (*) 70 - 99 mg/dL   CBC     Status: Abnormal   Collection Time   08/01/12  6:40 AM      Component Value Range Comment   WBC 10.7 (*) 4.0 - 10.5 K/uL    RBC 2.96 (*) 3.87 - 5.11 MIL/uL    Hemoglobin 9.0 (*) 12.0 - 15.0 g/dL    HCT 65.7 (*) 84.6 - 46.0 %    MCV 89.5  78.0 - 100.0 fL    MCH 30.4  26.0 - 34.0 pg    MCHC 34.0  30.0 - 36.0 g/dL    RDW 96.2  95.2 - 84.1 %    Platelets 258  150 - 400 K/uL   BASIC METABOLIC PANEL     Status: Abnormal   Collection Time   08/01/12  6:40 AM      Component Value Range Comment   Sodium 135  135 - 145 mEq/L    Potassium 3.2 (*) 3.5 - 5.1 mEq/L    Chloride 99  96 - 112 mEq/L    CO2 28  19 - 32 mEq/L    Glucose, Bld 131 (*) 70 - 99 mg/dL    BUN 9  6 - 23 mg/dL    Creatinine, Ser 3.24  0.50 - 1.10 mg/dL    Calcium 8.5  8.4 - 40.1 mg/dL    GFR calc non Af Amer >90  >90 mL/min    GFR calc Af Amer >90  >90 mL/min   WOUND  CULTURE     Status: Normal (Preliminary result)   Collection Time   08/01/12  7:19 AM      Component Value Range Comment   Specimen Description FOOT      Special Requests NONE      Gram Stain        Value: FEW WBC PRESENT, PREDOMINANTLY PMN     NO SQUAMOUS EPITHELIAL CELLS SEEN     FEW GRAM NEGATIVE RODS     FEW GRAM POSITIVE COCCI IN PAIRS   Culture Culture reincubated for better growth      Report Status PENDING     GLUCOSE, CAPILLARY     Status: Abnormal   Collection Time   08/01/12  7:26 AM      Component Value Range Comment   Glucose-Capillary 105 (*) 70 - 99 mg/dL    Comment 1 Documented in Chart      Comment 2 Notify RN     GLUCOSE, CAPILLARY     Status: Abnormal   Collection Time   08/01/12 11:53 AM      Component Value Range Comment   Glucose-Capillary 164 (*) 70 - 99 mg/dL    Comment 1 Documented in Chart  Comment 2 Notify RN     GLUCOSE, CAPILLARY     Status: Abnormal   Collection Time   08/01/12  4:58 PM      Component Value Range Comment   Glucose-Capillary 43 (*) 70 - 99 mg/dL    Comment 1 Documented in Chart      Comment 2 Notify RN     GLUCOSE, CAPILLARY     Status: Normal   Collection Time   08/01/12  5:25 PM      Component Value Range Comment   Glucose-Capillary 84  70 - 99 mg/dL    Comment 1 Documented in Chart      Comment 2 Notify RN     GLUCOSE, CAPILLARY     Status: Normal   Collection Time   08/01/12  6:07 PM      Component Value Range Comment   Glucose-Capillary 93  70 - 99 mg/dL    Comment 1 Documented in Chart      Comment 2 Notify RN     GLUCOSE, CAPILLARY     Status: Abnormal   Collection Time   08/01/12 10:10 PM      Component Value Range Comment   Glucose-Capillary 170 (*) 70 - 99 mg/dL   GLUCOSE, CAPILLARY     Status: Abnormal   Collection Time   08/01/12 11:48 PM      Component Value Range Comment   Glucose-Capillary 157 (*) 70 - 99 mg/dL   CBC     Status: Abnormal   Collection Time   08/02/12  3:30 AM      Component Value Range  Comment   WBC 13.1 (*) 4.0 - 10.5 K/uL    RBC 3.02 (*) 3.87 - 5.11 MIL/uL    Hemoglobin 9.1 (*) 12.0 - 15.0 g/dL    HCT 40.9 (*) 81.1 - 46.0 %    MCV 89.4  78.0 - 100.0 fL    MCH 30.1  26.0 - 34.0 pg    MCHC 33.7  30.0 - 36.0 g/dL    RDW 91.4  78.2 - 95.6 %    Platelets 281  150 - 400 K/uL   GLUCOSE, CAPILLARY     Status: Normal   Collection Time   08/02/12  6:18 AM      Component Value Range Comment   Glucose-Capillary 85  70 - 99 mg/dL   GLUCOSE, CAPILLARY     Status: Normal   Collection Time   08/02/12  7:31 AM      Component Value Range Comment   Glucose-Capillary 92  70 - 99 mg/dL    Comment 1 Documented in Chart      Comment 2 Notify RN     GLUCOSE, CAPILLARY     Status: Normal   Collection Time   08/02/12 11:51 AM      Component Value Range Comment   Glucose-Capillary 95  70 - 99 mg/dL   GLUCOSE, CAPILLARY     Status: Normal   Collection Time   08/02/12  5:06 PM      Component Value Range Comment   Glucose-Capillary 82  70 - 99 mg/dL    Comment 1 Documented in Chart      Comment 2 Notify RN       Mr Foot Right Wo Contrast  08/01/2012  *RADIOLOGY REPORT*  Clinical Data: Medial plantar forefoot ulceration.  History of diabetes.  Evaluate for osteomyelitis.  MRI OF THE RIGHT FOREFOOT WITHOUT CONTRAST  Technique:  Multiplanar, multisequence  MR imaging was performed. No intravenous contrast was administered.  Comparison: Radiographs 07/31/2012.  Findings: There appears to be some soft tissue ulceration plantar to the first metatarsal phalangeal joint.  Underlying plantar subcutaneous edema is asymmetric medially.  No focal fluid collection is evident on noncontrast imaging.  There is more extensive diffuse dorsal subcutaneous edema and ill-defined fluid. There is focal intermetatarsal fluid within the first webspace.  No significant fluid is seen within the flexor or extensor tendon sheaths.  No significant metatarsal phalangeal joint effusions are seen.  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  Mild degenerative changes are present at the first metatarsal phalangeal joint.  There is no evidence of acute fracture or bone destruction.  No foreign bodies are identified.  IMPRESSION:  1.  No evidence of focal forefoot soft tissue abscess.  There is mild ulceration plantar to the first metatarsal phalangeal joint. 2.  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  Myositis not excluded. 3.  No evidence of osteomyelitis.   Original Report Authenticated By: Gerrianne Scale, M.D.    Dg Foot Complete Right  07/31/2012  *RADIOLOGY REPORT*  Clinical Data: Ulceration on the plantar surface of the foot.  RIGHT FOOT COMPLETE - 3+ VIEW  Comparison: None.  Findings: Soft tissues of the foot are diffusely swollen.  Plantar skin ulcer is identified.  No bony destructive change, radiopaque foreign body or soft tissue gas collection is identified.  There is no fracture or dislocation.  IMPRESSION: Soft tissue swelling and skin ulceration without evidence of osteomyelitis.   Original Report Authenticated By: Bernadene Bell. D'ALESSIO, M.D.     @ROS @ Blood pressure 142/76, pulse 78, temperature 98.4 F (36.9 C), temperature source Oral, resp. rate 20, height 5\' 8"  (1.727 m), weight 70.9 kg (156 lb 4.9 oz), last menstrual period 02/09/2011, SpO2 100.00%.  PHYSICAL EXAM:   ABD soft Intact pulses distally Compartment soft  Foot feels warm but so do all 4 extremities No sensation from ankle down on either side Good refill No fluctuance No drainage from tiny punctate wound near plantar aspect of first MTP region  ASSESSMENT:   Right foot cellulitis  PLAN:   Based on xray and MRI there is no evidence of abscess or osteomyelitis.  No real surgical indication at this time.  Agree with continued Zosyn and Vancomycin.  No wound to address at this point either.  Please call if things  change and I will come back and see this nice lady but not much for me to do at this time.   Lesa Vandall G 08/02/2012, 6:52 PM

## 2012-08-02 NOTE — Progress Notes (Signed)
ANTIBIOTIC CONSULT NOTE - INITIAL  Pharmacy Consult for Vancomycin Indication: diabetic foot ulcer  No Known Allergies  Patient Measurements: Height: 5\' 8"  (172.7 cm) Weight: 156 lb 4.9 oz (70.9 kg) IBW/kg (Calculated) : 63.9   Vital Signs: Temp: 101.4 F (38.6 C) (09/19 2230) Temp src: Oral (09/19 2230) Pulse Rate: 86  (09/19 2230) Intake/Output from previous day: 09/19 0701 - 09/20 0700 In: 2295 [I.V.:2145; IV Piggyback:150] Out: -  Intake/Output from this shift:    Labs:  Basename 08/02/12 0330 08/01/12 0640 07/31/12 2230  WBC 13.1* 10.7* 14.6*  HGB 9.1* 9.0* 9.8*  PLT 281 258 306  LABCREA -- -- --  CREATININE -- 0.72 0.69   Estimated Creatinine Clearance: 85.8 ml/min (by C-G formula based on Cr of 0.72). No results found for this basename: VANCOTROUGH:2,VANCOPEAK:2,VANCORANDOM:2,GENTTROUGH:2,GENTPEAK:2,GENTRANDOM:2,TOBRATROUGH:2,TOBRAPEAK:2,TOBRARND:2,AMIKACINPEAK:2,AMIKACINTROU:2,AMIKACIN:2, in the last 72 hours   Microbiology: Recent Results (from the past 720 hour(s))  WOUND CULTURE     Status: Normal (Preliminary result)   Collection Time   08/01/12  7:19 AM      Component Value Range Status Comment   Specimen Description FOOT   Final    Special Requests NONE   Final    Gram Stain     Final    Value: FEW WBC PRESENT, PREDOMINANTLY PMN     NO SQUAMOUS EPITHELIAL CELLS SEEN     FEW GRAM NEGATIVE RODS     FEW GRAM POSITIVE COCCI IN PAIRS   Culture Culture reincubated for better growth   Final    Report Status PENDING   Incomplete     Medical History: Past Medical History  Diagnosis Date  . Diabetes mellitus     Medications:  Prescriptions prior to admission  Medication Sig Dispense Refill  . insulin glargine (LANTUS) 100 UNIT/ML injection Inject 25 Units into the skin 2 (two) times daily.       . ciprofloxacin (CIPRO) 500 MG tablet Take 500 mg by mouth 2 (two) times daily.       Anti-infectives     Start     Dose/Rate Route Frequency Ordered Stop     08/01/12 0600   piperacillin-tazobactam (ZOSYN) IVPB 3.375 g        3.375 g 12.5 mL/hr over 240 Minutes Intravenous 3 times per day 08/01/12 0207     07/31/12 2245   piperacillin-tazobactam (ZOSYN) IVPB 3.375 g        3.375 g 100 mL/hr over 30 Minutes Intravenous  Once 07/31/12 2232 07/31/12 2328         Assessment:  51yo F admitted with purulent draining foot ulcer. Xray was negative for osteomyelitis.  Zosyn was started 9/18.  Adding Vanc today 9/20.  SCr wnl, CrCl ~86.  Wound cx growing GNR and GPC.  Goal of Therapy:  Vancomycin trough level 10-34mcg/ml  Plan:   Vancomycin 1g IV q12h.  Agree with Zosyn 3.375g IV Q8H infused over 4hrs.  Measure Vanc trough at steady state.  Follow up renal fxn and culture results.  Charolotte Eke, PharmD, pager [phone]. 08/02/2012,10:32 AM.

## 2012-08-02 NOTE — Progress Notes (Signed)
[facility] PROGRESS NOTE  [name]:[id number] DOB: 07-Sep-1961 DOA: 07/31/2012 PCP: No primary provider on file.  Assessment/Plan: Principal Problem:  *Diabetic foot ulcer Active Problems:  DM2 (diabetes mellitus, type 2)  Leukocytosis  Cellulitis  *Diabetic foot ulcer/ Cellulitis with probable abscess -Discussed above, patient still with fevers overnight will obtain blood cultures, add vancomycin to Zosyn -Exam with findings consistent with abscess, I have consulted orthopedics for possible I&D -MRI with no evidence of osteomyelitis Active Problems:  DM2 (diabetes mellitus, type 2)  -Continue current Lantus and sliding scale -Consult diabetes education Leukocytosis  -Trending up today Secondary to above, antibiotics/management as above. 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 Code Status: Full code Family Communication: Family at bedside Disposition Plan: To home when medically stable   Brief narrative: Jennifer [name] will per 51 year old with diabetes mellitus admitted with diabetic foot ulcer with cellulitis.  MRI done 9/19 with no evidence of osteomyelitis, and wound care consulted.   Consultants:  Orthopedics-awaiting evaluation  Procedures:  None  Antibiotics:   Vancomycin started on 9/20  Zosyn started on 9/19  HPI/Subjective: Still with right foot pain and swelling, denies any new complaints.  Objective: Filed Vitals:   08/01/12 0415 08/01/12 1400 08/01/12 2215 08/01/12 2230  BP: 119/64 123/69 144/57   Pulse: 91 81 111 86  Temp: 98.5 F (36.9 C) 98 F (36.7 C) 103.1 F (39.5 C) 101.4 F (38.6 C)  TempSrc: Oral Oral Oral Oral  Resp: 16 16 18    Height: 5\' 8"  (1.727 m)     Weight: 70.9 kg (156 lb 4.9 oz)     SpO2: 98% 99% 95%     Intake/Output Summary (Last 24 hours) at 08/02/12 1214 Last data filed at 08/02/12 2952  Gross per 24 hour  Intake   2295 ml    Output      0 ml  Net   2295 ml   Filed Weights   07/31/12 2000 08/01/12 0415  Weight: 70.761 kg (156 lb) 70.9 kg (156 lb 4.9 oz)    Exam:   General: Middle aged black female in no apparent distress  Cardiovascular: Regular rate and rhythm normal S1-S2  Respiratory: Clear to auscultation bilaterally no crackles or wheezes  Extremities: Right foot edematous, plantar surface in forefoot area with area of fluctuance lateral to the small ulcer. Left foot within normal limits.  Data Reviewed: Basic Metabolic Panel:  Lab 08/01/12 8413 07/31/12 2230  NA 135 132*  K 3.2* 3.5  CL 99 95*  CO2 28 25  GLUCOSE 131* 110*  BUN 9 10  CREATININE 0.72 0.69  CALCIUM 8.5 9.2  MG -- --  PHOS -- --   Liver Function Tests: No results found for this basename: AST:5,ALT:5,ALKPHOS:5,BILITOT:5,PROT:5,ALBUMIN:5 in the last 168 hours No results found for this basename: LIPASE:5,AMYLASE:5 in the last 168 hours No results found for this basename: AMMONIA:5 in the last 168 hours CBC:  Lab 08/02/12 0330 08/01/12 0640 07/31/12 2230  WBC 13.1* 10.7* 14.6*  NEUTROABS -- -- 10.2*  HGB 9.1* 9.0* 9.8*  HCT 27.0* 26.5* 28.3*  MCV 89.4 89.5 89.0  PLT 281 258 306   Cardiac Enzymes: No results found for this basename: CKTOTAL:5,CKMB:5,CKMBINDEX:5,TROPONINI:5 in the last 168 hours BNP (last 3 results) No results found for this basename: PROBNP:3 in the last 8760 hours CBG:  Lab 08/02/12 1151 08/02/12 0731 08/02/12 0618 08/01/12 2348 08/01/12 2210  GLUCAP 95 92 85 157* 170*  Recent Results (from the past 240 hour(s))  WOUND CULTURE     Status: Normal (Preliminary result)   Collection Time   08/01/12  7:19 AM      Component Value Range Status Comment   Specimen Description FOOT   Final    Special Requests NONE   Final    Gram Stain     Final    Value: FEW WBC PRESENT, PREDOMINANTLY PMN     NO SQUAMOUS EPITHELIAL CELLS SEEN     FEW GRAM NEGATIVE RODS     FEW GRAM POSITIVE COCCI IN PAIRS    Culture Culture reincubated for better growth   Final    Report Status PENDING   Incomplete      Studies: Mr Foot Right Wo Contrast  08/01/2012  *RADIOLOGY REPORT*  Clinical Data: Medial plantar forefoot ulceration.  History of diabetes.  Evaluate for osteomyelitis.  MRI OF THE RIGHT FOREFOOT WITHOUT CONTRAST  Technique:  Multiplanar, multisequence MR imaging was performed. No intravenous contrast was administered.  Comparison: Radiographs 07/31/2012.  Findings: There appears to be some soft tissue ulceration plantar to the first metatarsal phalangeal joint.  Underlying plantar subcutaneous edema is asymmetric medially.  No focal fluid collection is evident on noncontrast imaging.  There is more extensive diffuse dorsal subcutaneous edema and ill-defined fluid. There is focal intermetatarsal fluid within the first webspace.  No significant fluid is seen within the flexor or extensor tendon sheaths.  No significant metatarsal phalangeal joint effusions are seen.  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  Mild degenerative changes are present at the first metatarsal phalangeal joint.  There is no evidence of acute fracture or bone destruction.  No foreign bodies are identified.  IMPRESSION:  1.  No evidence of focal forefoot soft tissue abscess.  There is mild ulceration plantar to the first metatarsal phalangeal joint. 2.  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  Myositis not excluded. 3.  No evidence of osteomyelitis.   Original Report Authenticated By: Gerrianne Scale, M.D.    Dg Foot Complete Right  07/31/2012  *RADIOLOGY REPORT*  Clinical Data: Ulceration on the plantar surface of the foot.  RIGHT FOOT COMPLETE - 3+ VIEW  Comparison: None.  Findings: Soft tissues of the foot are diffusely swollen.  Plantar skin ulcer is identified.  No bony destructive change, radiopaque  foreign body or soft tissue gas collection is identified.  There is no fracture or dislocation.  IMPRESSION: Soft tissue swelling and skin ulceration without evidence of osteomyelitis.   Original Report Authenticated By: Bernadene Bell. D'ALESSIO, M.D.     Scheduled Meds:   . heparin  5,000 Units Subcutaneous Q8H  . insulin aspart  0-15 Units Subcutaneous TID WC  . insulin glargine  25 Units Subcutaneous BID  . piperacillin-tazobactam (ZOSYN)  IV  3.375 g Intravenous Q8H  . vancomycin  1,000 mg Intravenous Q12H   Continuous Infusions:   . sodium chloride 75 mL/hr at 08/02/12 0543    Principal Problem:  *Diabetic foot ulcer Active Problems:  DM2 (diabetes mellitus, type 2)  Leukocytosis  Cellulitis    Time spent:    [facility]  [facility] Pager [phone]. If 8PM-8AM, please contact night-coverage at [email or web address], password [facility] 08/02/2012, 12:14 PM  LOS: 2 days

## 2012-08-02 NOTE — Progress Notes (Signed)
Inpatient Diabetes Program Recommendations  AACE/ADA: New Consensus Statement on Inpatient Glycemic Control (2013)  Target Ranges:  Prepandial:   less than 140 mg/dL      Peak postprandial:   less than 180 mg/dL (1-2 hours)      Critically ill patients:  140 - 180 mg/dL   Reason for Visit: Consult - diabetes   51 y.o. female with PMH of DM2 diagnosed 13 years ago (does not know her A1C), who tried to cut what she thought was a callus off of her foot 2 weeks ago. She thought nothing of it until 2 days ago when her foot and ankle began to swell up. Finally today she noticed drainage through her sock and a very foul smell when she took her sock off. This prompted the patient to present to the ED for treatment.   Results for Jennifer, [name] (MRN [id number]) as of 08/02/2012 17:12  Ref. Range 07/31/2012 22:30  Hemoglobin A1C Latest Range: <5.7 % 6.7 (H)  Results for Jennifer, [name] (MRN [id number]) as of 08/02/2012 17:12  Ref. Range 08/01/2012 16:58 08/01/2012 17:25 08/01/2012 18:07 08/01/2012 22:10 08/01/2012 23:48 08/02/2012 06:18 08/02/2012 07:31 08/02/2012 11:51  Glucose-Capillary Latest Range: 70-99 mg/dL 43 (LL) 84 93 782 (H) 956 (H) 85 92 95   Home meds for diabetes:  Lantus 25 bid.  Has been on metformin, glipizide and Novolog in the past.  Needs PCP in [place] to manage DM.  Pt has recently moved to [place] from [place].   Had hypoglycemia on 9/19 from decreased po intake.  States she has hypoglycemia occas. at home.  Has attended several diabetes classes and seems very knowledgeable about diet, exercise, and monitoring.  States her stress runs blood sugars up.  Needs prescription for new meter and strips.  Gave info on low cost meter from [employer].  Inpatient Diabetes Program Recommendations Insulin - Basal: Decrease Lantus to 20 units bid Correction (SSI): Add HS correction Insulin - Meal Coverage: Add meal coverage insulin - Novolog 3 units tidwc if pt eaats >50% meal - Titrate up Novolog  until blood sugars are <150 mg/dL Instructed to view diabetes videos on pt ed channel and gave copy of Living Well With Diabetes folder.    Answered questions and pt voiced understanding.    Thank you. Ailene Ards, RD, LDN, CDE Inpatient Diabetes Coordinator [phone]

## 2012-08-03 LAB — CBC
Hemoglobin: 8.8 g/dL — ABNORMAL LOW (ref 12.0–15.0)
MCH: 30.3 pg (ref 26.0–34.0)
MCHC: 34 g/dL (ref 30.0–36.0)
MCV: 89.3 fL (ref 78.0–100.0)
RBC: 2.9 MIL/uL — ABNORMAL LOW (ref 3.87–5.11)

## 2012-08-03 LAB — GLUCOSE, CAPILLARY
Glucose-Capillary: 97 mg/dL (ref 70–99)
Glucose-Capillary: 180 mg/dL — ABNORMAL HIGH (ref 70–99)
Glucose-Capillary: 49 mg/dL — ABNORMAL LOW (ref 70–99)
Glucose-Capillary: 134 mg/dL — ABNORMAL HIGH (ref 70–99)
Glucose-Capillary: 76 mg/dL (ref 70–99)

## 2012-08-03 LAB — WOUND CULTURE

## 2012-08-03 LAB — BASIC METABOLIC PANEL
CO2: 29 mEq/L (ref 19–32)
Calcium: 8.5 mg/dL (ref 8.4–10.5)
Creatinine, Ser: 0.85 mg/dL (ref 0.50–1.10)
GFR calc non Af Amer: 79 mL/min — ABNORMAL LOW (ref >90)
Glucose, Bld: 142 mg/dL — ABNORMAL HIGH (ref 70–99)

## 2012-08-03 MED ORDER — INSULIN GLARGINE 100 UNIT/ML ~~LOC~~ SOLN
15.0000 [IU] | Freq: Two times a day (BID) | SUBCUTANEOUS | Status: DC
  Administered 2012-08-04 – 2012-08-07 (×6): 15 [IU] via SUBCUTANEOUS

## 2012-08-03 MED ORDER — INSULIN GLARGINE 100 UNIT/ML ~~LOC~~ SOLN: 5.0000 [IU] | Freq: Every day | SUBCUTANEOUS | Status: AC

## 2012-08-03 MED ORDER — POTASSIUM CHLORIDE CRYS ER 20 MEQ PO TBCR
40.0000 meq | EXTENDED_RELEASE_TABLET | ORAL | Status: AC
  Administered 2012-08-03 (×3): 40 meq via ORAL
  Filled 2012-08-03 (×3): qty 2

## 2012-08-03 NOTE — Progress Notes (Addendum)
[facility] PROGRESS NOTE  DALLY GAWRONSKI XBJ:[id number] DOB: [birth year] DOA: 07/31/2012 PCP: No primary provider on file.  Assessment/Plan: Principal Problem:  *Diabetic foot ulcer Active Problems:  DM2 (diabetes mellitus, type 2)  Leukocytosis  Cellulitis  *Diabetic foot ulcer/ Cellulitis  -Discussed above, patient still with fevers overnight will follow blood cultures, on vancomycin & Zosyn -Appreciate orthopedic input, continue antibiotics as above -MRI with no evidence of osteomyelitis Active Problems:  DM2 (diabetes mellitus, type 2)  -Continue current Lantus and sliding scale -diabetes education Leukocytosis  - Secondary to above, antibiotics/management as above. 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 Hypokalemia -replace k  Code Status: Full code Family Communication:  Disposition Plan: To home when medically stable   Brief narrative: Jennifer [name] is a 51 year old with diabetes mellitus admitted with diabetic foot ulcer with cellulitis.  MRI done 9/19 with no evidence of osteomyelitis, and wound care consulted.   Consultants:  Orthopedics-awaiting evaluation  Procedures:  None  Antibiotics:   Vancomycin started on 9/20  Zosyn started on 9/19  HPI/Subjective: denies any new complaints.  Objective: Filed Vitals:   08/02/12 2200 08/03/12 0620 08/03/12 0710 08/03/12 1030  BP: 127/64 128/60  123/62  Pulse: 91 93  101  Temp: 99.7 F (37.6 C) 102.8 F (39.3 C) 98.7 F (37.1 C) 98.7 F (37.1 C)  TempSrc: Oral Oral Oral Oral  Resp: 18 18  16   Height:      Weight:      SpO2: 99% 94%  97%    Intake/Output Summary (Last 24 hours) at 08/03/12 1325 Last data filed at 08/03/12 1100  Gross per 24 hour  Intake   2655 ml  Output    300 ml  Net   2355 ml   Filed Weights   07/31/12 2000 08/01/12 0415  Weight: 70.761 kg (156 lb) 70.9 kg (156 lb 4.9 oz)     Exam:   General: Middle aged black female in no apparent distress  Cardiovascular: Regular rate and rhythm normal S1-S2  Respiratory: Clear to auscultation bilaterally no crackles or wheezes  Extremities: Right foot edematous, dressing with pus like drainage Left foot within normal limits.  Data Reviewed: Basic Metabolic Panel:  Lab 08/03/12 3086 08/01/12 0640 07/31/12 2230  NA 135 135 132*  K 3.0* 3.2* 3.5  CL 100 99 95*  CO2 29 28 25   GLUCOSE 142* 131* 110*  BUN 7 9 10   CREATININE 0.85 0.72 0.69  CALCIUM 8.5 8.5 9.2  MG -- -- --  PHOS -- -- --   Liver Function Tests: No results found for this basename: AST:5,ALT:5,ALKPHOS:5,BILITOT:5,PROT:5,ALBUMIN:5 in the last 168 hours No results found for this basename: LIPASE:5,AMYLASE:5 in the last 168 hours No results found for this basename: AMMONIA:5 in the last 168 hours CBC:  Lab 08/03/12 0423 08/02/12 0330 08/01/12 0640 07/31/12 2230  WBC 12.1* 13.1* 10.7* 14.6*  NEUTROABS -- -- -- 10.2*  HGB 8.8* 9.1* 9.0* 9.8*  HCT 25.9* 27.0* 26.5* 28.3*  MCV 89.3 89.4 89.5 89.0  PLT 297 281 258 306   Cardiac Enzymes: No results found for this basename: CKTOTAL:5,CKMB:5,CKMBINDEX:5,TROPONINI:5 in the last 168 hours BNP (last 3 results) No results found for this basename: PROBNP:3 in the last 8760 hours CBG:  Lab 08/03/12 1217 08/03/12 0815 08/03/12 0620 08/02/12 2322 08/02/12 2205  GLUCAP 180* 149* 129* 116* 97    Recent Results (from the past 240 hour(s))  WOUND CULTURE     Status:  Normal (Preliminary result)   Collection Time   08/01/12  7:19 AM      Component Value Range Status Comment   Specimen Description FOOT   Final    Special Requests NONE   Final    Gram Stain     Final    Value: FEW WBC PRESENT, PREDOMINANTLY PMN     NO SQUAMOUS EPITHELIAL CELLS SEEN     FEW GRAM NEGATIVE RODS     FEW GRAM POSITIVE COCCI IN PAIRS   Culture Culture reincubated for better growth   Final    Report Status PENDING   Incomplete       Studies: Mr Foot Right Wo Contrast  08/01/2012  *RADIOLOGY REPORT*  Clinical Data: Medial plantar forefoot ulceration.  History of diabetes.  Evaluate for osteomyelitis.  MRI OF THE RIGHT FOREFOOT WITHOUT CONTRAST  Technique:  Multiplanar, multisequence MR imaging was performed. No intravenous contrast was administered.  Comparison: Radiographs 07/31/2012.  Findings: There appears to be some soft tissue ulceration plantar to the first metatarsal phalangeal joint.  Underlying plantar subcutaneous edema is asymmetric medially.  No focal fluid collection is evident on noncontrast imaging.  There is more extensive diffuse dorsal subcutaneous edema and ill-defined fluid. There is focal intermetatarsal fluid within the first webspace.  No significant fluid is seen within the flexor or extensor tendon sheaths.  No significant metatarsal phalangeal joint effusions are seen.  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  Mild degenerative changes are present at the first metatarsal phalangeal joint.  There is no evidence of acute fracture or bone destruction.  No foreign bodies are identified.  IMPRESSION:  1.  No evidence of focal forefoot soft tissue abscess.  There is mild ulceration plantar to the first metatarsal phalangeal joint. 2.  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  Myositis not excluded. 3.  No evidence of osteomyelitis.   Original Report Authenticated By: Gerrianne Scale, M.D.     Scheduled Meds:    . heparin  5,000 Units Subcutaneous Q8H  . insulin aspart  0-15 Units Subcutaneous TID WC  . insulin glargine  25 Units Subcutaneous BID  . piperacillin-tazobactam (ZOSYN)  IV  3.375 g Intravenous Q8H  . vancomycin  1,000 mg Intravenous Q12H   Continuous Infusions:    . sodium chloride 75 mL/hr at 08/02/12 0543    Principal Problem:  *Diabetic foot ulcer Active  Problems:  DM2 (diabetes mellitus, type 2)  Leukocytosis  Cellulitis    Time spent:    [name]  [facility] Pager [phone]. If 8PM-8AM, please contact night-coverage at [email or web address], password [facility] 08/03/2012, 1:25 PM  LOS: 3 days

## 2012-08-04 LAB — CBC
HCT: 25.6 % — ABNORMAL LOW (ref 36.0–46.0)
Hemoglobin: 8.7 g/dL — ABNORMAL LOW (ref 12.0–15.0)
MCH: 30.5 pg (ref 26.0–34.0)
MCHC: 34 g/dL (ref 30.0–36.0)
RBC: 2.85 MIL/uL — ABNORMAL LOW (ref 3.87–5.11)

## 2012-08-04 LAB — BASIC METABOLIC PANEL
BUN: 6 mg/dL (ref 6–23)
CO2: 28 mEq/L (ref 19–32)
Glucose, Bld: 103 mg/dL — ABNORMAL HIGH (ref 70–99)
Potassium: 4.3 mEq/L (ref 3.5–5.1)
Sodium: 135 mEq/L (ref 135–145)

## 2012-08-04 LAB — GLUCOSE, CAPILLARY
Glucose-Capillary: 188 mg/dL — ABNORMAL HIGH (ref 70–99)
Glucose-Capillary: 104 mg/dL — ABNORMAL HIGH (ref 70–99)

## 2012-08-04 NOTE — Progress Notes (Signed)
Clinical Social Work Department BRIEF PSYCHOSOCIAL ASSESSMENT 08/04/2012  Patient:  Jennifer [name], Jennifer [name]     Account Number:  [id number]     Admit date:  04/06/2012  Clinical Social Worker:  Leron Croak, CLINICAL SOCIAL WORKER  Date/Time:  08/04/2012 04:07 PM  Referred by:  Physician  Date Referred:  08/03/2012 Referred for  Other - See comment   Other Referral:   D/C planning   Interview type:  Patient Other interview type:    PSYCHOSOCIAL DATA Living Status:  OTHER Admitted from facility:   Level of care:   Primary support name:  Conard Novak Primary support relationship to patient:  SIBLING Degree of support available:   good support    CURRENT CONCERNS Current Concerns  Other - See comment   Other Concerns:   Assist in d/c planning if needed    SOCIAL WORK ASSESSMENT / PLAN CSW met with the Pt at the bedside. Pt was unaware of any needs for d/c planning at this time. Pt will contact CSW (weekday) if any further needs are required.   Assessment/plan status:  Information/Referral to [facility] Other assessment/ plan:   Information/referral to community resources:   CSW provided contact information in case any needs come up.    PATIENT'S/FAMILY'S RESPONSE TO PLAN OF CARE: Pt was appreciative and stated she would contact the CSW with any other needs.      Leron Croak, LCSWA [employer] [phone]

## 2012-08-04 NOTE — Progress Notes (Signed)
[facility] PROGRESS NOTE  Jennifer [name] [name] [id number]:[id number] DOB: Jan 30, 1961 DOA: 07/31/2012 PCP: No primary provider on file.  Assessment/Plan: Principal Problem:  *Diabetic foot ulcer Active Problems:  DM2 (diabetes mellitus, type 2)  Leukocytosis  Cellulitis  *Diabetic foot ulcer/ Cellulitis  -Discussed above, patient still with fevers yesterday will follow blood cultures no growth so far, on vancomycin & Zosyn -Leukocytosis worsening, pus not draining out with conservative with care, if no further improvement overnight with will call back orthopedics in the a.m. -MRI with no evidence of osteomyelitis Active Problems:  DM2 (diabetes mellitus, type 2) with hypoglycemic episodes -We'll decrease Lantus Lantus and sliding scale -Infection/#1 possibly contributing to hypoglycemia, follow. Leukocytosis  - Secondary to above, antibiotics/management as above. 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 Hypokalemia -resolved  Code Status: Full code Family Communication:  Disposition Plan: To home when medically stable   Brief narrative: Ms. Jennifer [name] [name] is a 51 year old with diabetes mellitus admitted with diabetic foot ulcer with cellulitis.  MRI done 9/19 with no evidence of osteomyelitis, and wound care consulted.   Consultants:  Orthopedics-Dr Jerl Santos  Procedures:  None  Antibiotics:   Vancomycin started on 9/20  Zosyn started on 9/19  HPI/Subjective: denies any new complaints,still with right foot pain.  Objective: Filed Vitals:   08/03/12 1501 08/03/12 1836 08/03/12 2209 08/04/12 0550  BP: 153/77 131/57 137/63 121/65  Pulse: 90 113 93 85  Temp: 101.1 F (38.4 C) 98.7 F (37.1 C) 99.8 F (37.7 C) 98.7 F (37.1 C)  TempSrc: Oral Oral Oral Oral  Resp: 16 16 15 14   Height:      Weight:      SpO2: 96% 99% 93% 96%    Intake/Output Summary (Last 24 hours) at 08/04/12 1307 Last data  filed at 08/04/12 0530  Gross per 24 hour  Intake   1680 ml  Output    650 ml  Net   1030 ml   Filed Weights   07/31/12 2000 08/01/12 0415  Weight: 70.761 kg (156 lb) 70.9 kg (156 lb 4.9 oz)    Exam:   General: Middle aged black female in no apparent distress  Cardiovascular: Regular rate and rhythm normal S1-S2  Respiratory: Clear to auscultation bilaterally no crackles or wheezes  Extremities: Right foot edematous, plantar surface forefoot area of fluctuance superior laterally and new area inferior medially to the ulcer.  Data Reviewed: Basic Metabolic Panel:  Lab 08/04/12 9528 08/03/12 0423 08/01/12 0640 07/31/12 2230  NA 135 135 135 132*  K 4.3 3.0* 3.2* 3.5  CL 101 100 99 95*  CO2 28 29 28 25   GLUCOSE 103* 142* 131* 110*  BUN 6 7 9 10   CREATININE 0.92 0.85 0.72 0.69  CALCIUM 8.7 8.5 8.5 9.2  MG -- -- -- --  PHOS -- -- -- --   Liver Function Tests: No results found for this basename: AST:5,ALT:5,ALKPHOS:5,BILITOT:5,PROT:5,ALBUMIN:5 in the last 168 hours No results found for this basename: LIPASE:5,AMYLASE:5 in the last 168 hours No results found for this basename: AMMONIA:5 in the last 168 hours CBC:  Lab 08/04/12 0430 08/03/12 0423 08/02/12 0330 08/01/12 0640 07/31/12 2230  WBC 16.5* 12.1* 13.1* 10.7* 14.6*  NEUTROABS -- -- -- -- 10.2*  HGB 8.7* 8.8* 9.1* 9.0* 9.8*  HCT 25.6* 25.9* 27.0* 26.5* 28.3*  MCV 89.8 89.3 89.4 89.5 89.0  PLT 294 297 281 258 306   Cardiac Enzymes: No results found for this basename: CKTOTAL:5,CKMB:5,CKMBINDEX:5,TROPONINI:5 in the last  168 hours BNP (last 3 results) No results found for this basename: PROBNP:3 in the last 8760 hours CBG:  Lab 08/04/12 1150 08/04/12 0812 08/03/12 2207 08/03/12 1824 08/03/12 1718  GLUCAP 188* 97 76 134* 68*    Recent Results (from the past 240 hour(s))  WOUND CULTURE     Status: Normal   Collection Time   08/01/12  7:19 AM      Component Value Range Status Comment   Specimen Description FOOT    Final    Special Requests NONE   Final    Gram Stain     Final    Value: FEW WBC PRESENT, PREDOMINANTLY PMN     NO SQUAMOUS EPITHELIAL CELLS SEEN     FEW GRAM NEGATIVE RODS     FEW GRAM POSITIVE COCCI IN PAIRS   Culture     Final    Value: FEW GROUP B STREP(S.AGALACTIAE)ISOLATED     Note: TESTING AGAINST S. AGALACTIAE NOT ROUTINELY PERFORMED DUE TO PREDICTABILITY OF AMP/PEN/VAN SUSCEPTIBILITY.   Report Status 08/03/2012 FINAL   Final   CULTURE, BLOOD (ROUTINE X 2)     Status: Normal (Preliminary result)   Collection Time   08/02/12  4:55 PM      Component Value Range Status Comment   Specimen Description BLOOD RIGHT HAND   Final    Special Requests BOTTLES DRAWN AEROBIC AND ANAEROBIC 10CC   Final    Culture  Setup Time 08/03/2012 02:40   Final    Culture     Final    Value:        BLOOD CULTURE RECEIVED NO GROWTH TO DATE CULTURE WILL BE HELD FOR 5 DAYS BEFORE ISSUING A FINAL NEGATIVE REPORT   Report Status PENDING   Incomplete   CULTURE, BLOOD (ROUTINE X 2)     Status: Normal (Preliminary result)   Collection Time   08/02/12  5:02 PM      Component Value Range Status Comment   Specimen Description BLOOD RIGHT ARM   Final    Special Requests BOTTLES DRAWN AEROBIC AND ANAEROBIC 10CC   Final    Culture  Setup Time 08/03/2012 02:40   Final    Culture     Final    Value:        BLOOD CULTURE RECEIVED NO GROWTH TO DATE CULTURE WILL BE HELD FOR 5 DAYS BEFORE ISSUING A FINAL NEGATIVE REPORT   Report Status PENDING   Incomplete      Studies: No results found.  Scheduled Meds:    . heparin  5,000 Units Subcutaneous Q8H  . insulin aspart  0-15 Units Subcutaneous TID WC  . insulin glargine  15 Units Subcutaneous BID  . insulin glargine  5 Units Subcutaneous QHS  . piperacillin-tazobactam (ZOSYN)  IV  3.375 g Intravenous Q8H  . potassium chloride  40 mEq Oral Q4H  . vancomycin  1,000 mg Intravenous Q12H  . DISCONTD: insulin glargine  25 Units Subcutaneous BID   Continuous  Infusions:    . sodium chloride 75 mL/hr at 08/04/12 0033    Principal Problem:  *Diabetic foot ulcer Active Problems:  DM2 (diabetes mellitus, type 2)  Leukocytosis  Cellulitis    Time spent:    [name]  [facility] Pager [phone]. If 8PM-8AM, please contact night-coverage at [email or web address], password [facility] 08/04/2012, 1:07 PM  LOS: 4 days

## 2012-08-05 LAB — CBC
HCT: 25.6 % — ABNORMAL LOW (ref 36.0–46.0)
MCHC: 33.2 g/dL (ref 30.0–36.0)
Platelets: 328 10*3/uL (ref 150–400)
RDW: 12.5 % (ref 11.5–15.5)
WBC: 15.8 10*3/uL — ABNORMAL HIGH (ref 4.0–10.5)

## 2012-08-05 LAB — GLUCOSE, CAPILLARY
Glucose-Capillary: 168 mg/dL — ABNORMAL HIGH (ref 70–99)
Glucose-Capillary: 151 mg/dL — ABNORMAL HIGH (ref 70–99)
Glucose-Capillary: 106 mg/dL — ABNORMAL HIGH (ref 70–99)

## 2012-08-05 LAB — BASIC METABOLIC PANEL
BUN: 7 mg/dL (ref 6–23)
Chloride: 100 mEq/L (ref 96–112)
GFR calc Af Amer: 71 mL/min — ABNORMAL LOW (ref >90)
GFR calc non Af Amer: 61 mL/min — ABNORMAL LOW (ref >90)
Potassium: 3.8 mEq/L (ref 3.5–5.1)
Sodium: 136 mEq/L (ref 135–145)

## 2012-08-05 MED ORDER — ONDANSETRON HCL 4 MG/2ML IJ SOLN
INTRAMUSCULAR | Status: AC
  Administered 2012-08-05: 4 mg via INTRAVENOUS
  Filled 2012-08-05: qty 2

## 2012-08-05 MED ORDER — ONDANSETRON HCL 4 MG/2ML IJ SOLN
4.0000 mg | INTRAMUSCULAR | Status: DC | PRN
  Administered 2012-08-05 – 2012-08-12 (×11): 4 mg via INTRAVENOUS
  Filled 2012-08-05 (×10): qty 2

## 2012-08-05 NOTE — Progress Notes (Signed)
INITIAL ADULT NUTRITION ASSESSMENT Date: 08/05/2012   Time: 10:48 AM Reason for Assessment: Consult  INTERVENTION: Anti-emetics per MD. Hopefully as nausea continues to improve, intake will improve. Ordered pt diabetic snacks. Diabetic diet education provided. Will monitor.   ASSESSMENT: Female 51 y.o.  Dx: Diabetic foot ulcer  Food/Nutrition Related Hx: Pt reports good appetite and stable weight PTA. Pt reports her intake has been poor during admission r/t nausea and diarrhea. Pt denied any diarrhea today, states last episode was yesterday. Pt with right diabetic foot ulcer. Pt reports eating "bad" foods PTA and knows she needs to eat better.    Hx:  Past Medical History  Diagnosis Date  . Diabetes mellitus    Related Meds:  Scheduled Meds:   . heparin  5,000 Units Subcutaneous Q8H  . insulin aspart  0-15 Units Subcutaneous TID WC  . insulin glargine  15 Units Subcutaneous BID  . insulin glargine  5 Units Subcutaneous QHS  . piperacillin-tazobactam (ZOSYN)  IV  3.375 g Intravenous Q8H  . vancomycin  1,000 mg Intravenous Q12H   Continuous Infusions:   . sodium chloride 75 mL/hr (08/04/12 1715)   PRN Meds:.acetaminophen, ondansetron, oxyCODONE-acetaminophen, zolpidem  Ht: 5\' 8"  (172.7 cm)  Wt: 156 lb 4.9 oz (70.9 kg)  Ideal Wt: 140 lb % Ideal Wt: 111  Usual Wt: 156 lb % Usual Wt: 100  Body mass index is 23.77 kg/(m^2).  Labs:  CMP     Component Value Date/Time   NA 136 08/05/2012 0340   K 3.8 08/05/2012 0340   CL 100 08/05/2012 0340   CO2 26 08/05/2012 0340   GLUCOSE 105* 08/05/2012 0340   BUN 7 08/05/2012 0340   CREATININE 1.05 08/05/2012 0340   CALCIUM 8.6 08/05/2012 0340   PROT 7.1 05/08/2011 1120   ALBUMIN 3.4* 05/08/2011 1120   AST 15 05/08/2011 1120   ALT 11 05/08/2011 1120   ALKPHOS 99 05/08/2011 1120   BILITOT 0.4 05/08/2011 1120   GFRNONAA 61* 08/05/2012 0340   GFRAA 71* 08/05/2012 0340   Lab Results  Component Value Date   HGBA1C 6.7* 07/31/2012   CBG  (last 3)   Basename 08/05/12 0732 08/04/12 2207 08/04/12 1701  GLUCAP 152* 77 104*    Intake/Output Summary (Last 24 hours) at 08/05/12 1052 Last data filed at 08/04/12 1500  Gross per 24 hour  Intake    240 ml  Output   1000 ml  Net   -760 ml   Last BM - 9/21   Diet Order: Carb Control   IVF:    sodium chloride Last Rate: 75 mL/hr (08/04/12 1715)    Estimated Nutritional Needs:   Kcal:1750-1900 Protein:70-85g Fluid:1.7-1.9L  NUTRITION DIAGNOSIS: -Inadequate oral intake (NI-2.1).  Status: Ongoing  RELATED TO: nausea/diarrhea  AS EVIDENCE BY: pt statement  MONITORING/EVALUATION(Goals): 1. Resolution of nausea/diarrhea 2. Pt to consume >90% of meals.   EDUCATION NEEDS: -Education needs addressed - spent >20 minutes discussing diabetic diet using teach back method. Provided handouts of this information.    Dietitian #: [phone]  DOCUMENTATION CODES Per approved criteria  -Not Applicable    Marshall Cork 08/05/2012, 10:48 AM

## 2012-08-05 NOTE — Progress Notes (Signed)
[facility] PROGRESS NOTE  VIANET SOUTHWORTH EAV:[id number] DOB: 01-Sep-1961 DOA: 07/31/2012 PCP: No primary provider on file.  Assessment/Plan: Principal Problem:  *Diabetic foot ulcer Active Problems:  DM2 (diabetes mellitus, type 2)  Leukocytosis  Cellulitis  *Diabetic foot ulcer/ Cellulitis  -Discussed above, patient still with fevers today, blood cultures no growth so far. continue vancomycin & Zosyn -Leukocytosis not improving, pus not draining out with conservative with care, -I have reconsulted Dr. Yisroel Ramming  for possible I&D -MRI with no evidence of osteomyelitis Active Problems:  DM2 (diabetes mellitus, type 2) with hypoglycemic episodes -BG so far stable, continue current Lantus -Infection and #1 possibly contributing to hypoglycemia, follow. Leukocytosis  - Secondary to above, antibiotics/management as above. 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 Hypokalemia -resolved  Code Status: Full code Family Communication:  Disposition Plan: To home when medically stable   Brief narrative: Jennifer [name] is a 51 year old with diabetes mellitus admitted with diabetic foot ulcer with cellulitis.  MRI done 9/19 with no evidence of osteomyelitis, and wound care consulted.   Consultants:  Orthopedics-Dr Jerl Santos  Procedures:  None  Antibiotics:   Vancomycin started on 9/20  Zosyn started on 9/19  HPI/Subjective: Complaining of nausea this a.m, decreased by mouth intake per nursing. Objective: Filed Vitals:   08/05/12 0854 08/05/12 0956 08/05/12 1121 08/05/12 1300  BP:    95/52  Pulse:    75  Temp: 100.8 F (38.2 C) 100.4 F (38 C) 98.7 F (37.1 C) 100 F (37.8 C)  TempSrc: Oral Oral    Resp:    20  Height:      Weight:      SpO2:    100%    Intake/Output Summary (Last 24 hours) at 08/05/12 1746 Last data filed at 08/05/12 1300  Gross per 24 hour  Intake   1000 ml  Output     300 ml  Net    700 ml   Filed Weights   07/31/12 2000 08/01/12 0415  Weight: 70.761 kg (156 lb) 70.9 kg (156 lb 4.9 oz)    Exam:   General: Middle aged black female in no apparent distress  Cardiovascular: Regular rate and rhythm normal S1-S2  Respiratory: Clear to auscultation bilaterally no crackles or wheezes  Extremities: Right foot edematous, plantar surface forefoot area of fluctuance superior laterally and new area inferior medially to the ulcer.  Data Reviewed: Basic Metabolic Panel:  Lab 08/05/12 7829 08/04/12 0430 08/03/12 0423 08/01/12 0640 07/31/12 2230  NA 136 135 135 135 132*  K 3.8 4.3 3.0* 3.2* 3.5  CL 100 101 100 99 95*  CO2 26 28 29 28 25   GLUCOSE 105* 103* 142* 131* 110*  BUN 7 6 7 9 10   CREATININE 1.05 0.92 0.85 0.72 0.69  CALCIUM 8.6 8.7 8.5 8.5 9.2  MG -- -- -- -- --  PHOS -- -- -- -- --   Liver Function Tests: No results found for this basename: AST:5,ALT:5,ALKPHOS:5,BILITOT:5,PROT:5,ALBUMIN:5 in the last 168 hours No results found for this basename: LIPASE:5,AMYLASE:5 in the last 168 hours No results found for this basename: AMMONIA:5 in the last 168 hours CBC:  Lab 08/05/12 0340 08/04/12 0430 08/03/12 0423 08/02/12 0330 08/01/12 0640 07/31/12 2230  WBC 15.8* 16.5* 12.1* 13.1* 10.7* --  NEUTROABS -- -- -- -- -- 10.2*  HGB 8.5* 8.7* 8.8* 9.1* 9.0* --  HCT 25.6* 25.6* 25.9* 27.0* 26.5* --  MCV 90.5 89.8 89.3 89.4 89.5 --  PLT  328 294 297 281 258 --   Cardiac Enzymes: No results found for this basename: CKTOTAL:5,CKMB:5,CKMBINDEX:5,TROPONINI:5 in the last 168 hours BNP (last 3 results) No results found for this basename: PROBNP:3 in the last 8760 hours CBG:  Lab 08/05/12 1711 08/05/12 1217 08/05/12 0732 08/04/12 2207 08/04/12 1701  GLUCAP 151* 168* 152* 77 104*    Recent Results (from the past 240 hour(s))  WOUND CULTURE     Status: Normal   Collection Time   08/01/12  7:19 AM      Component Value Range Status Comment   Specimen  Description FOOT   Final    Special Requests NONE   Final    Gram Stain     Final    Value: FEW WBC PRESENT, PREDOMINANTLY PMN     NO SQUAMOUS EPITHELIAL CELLS SEEN     FEW GRAM NEGATIVE RODS     FEW GRAM POSITIVE COCCI IN PAIRS   Culture     Final    Value: FEW GROUP B STREP(S.AGALACTIAE)ISOLATED     Note: TESTING AGAINST S. AGALACTIAE NOT ROUTINELY PERFORMED DUE TO PREDICTABILITY OF AMP/PEN/VAN SUSCEPTIBILITY.   Report Status 08/03/2012 FINAL   Final   CULTURE, BLOOD (ROUTINE X 2)     Status: Normal (Preliminary result)   Collection Time   08/02/12  4:55 PM      Component Value Range Status Comment   Specimen Description BLOOD RIGHT HAND   Final    Special Requests BOTTLES DRAWN AEROBIC AND ANAEROBIC 10CC   Final    Culture  Setup Time 08/03/2012 02:40   Final    Culture     Final    Value:        BLOOD CULTURE RECEIVED NO GROWTH TO DATE CULTURE WILL BE HELD FOR 5 DAYS BEFORE ISSUING A FINAL NEGATIVE REPORT   Report Status PENDING   Incomplete   CULTURE, BLOOD (ROUTINE X 2)     Status: Normal (Preliminary result)   Collection Time   08/02/12  5:02 PM      Component Value Range Status Comment   Specimen Description BLOOD RIGHT ARM   Final    Special Requests BOTTLES DRAWN AEROBIC AND ANAEROBIC 10CC   Final    Culture  Setup Time 08/03/2012 02:40   Final    Culture     Final    Value:        BLOOD CULTURE RECEIVED NO GROWTH TO DATE CULTURE WILL BE HELD FOR 5 DAYS BEFORE ISSUING A FINAL NEGATIVE REPORT   Report Status PENDING   Incomplete      Studies: No results found.  Scheduled Meds:    . heparin  5,000 Units Subcutaneous Q8H  . insulin aspart  0-15 Units Subcutaneous TID WC  . insulin glargine  15 Units Subcutaneous BID  . insulin glargine  5 Units Subcutaneous QHS  . piperacillin-tazobactam (ZOSYN)  IV  3.375 g Intravenous Q8H  . vancomycin  1,000 mg Intravenous Q12H   Continuous Infusions:    . sodium chloride 1,000 mL (08/05/12 1050)    Principal Problem:   *Diabetic foot ulcer Active Problems:  DM2 (diabetes mellitus, type 2)  Leukocytosis  Cellulitis    Time spent:    [name]  [facility] Pager [phone]. If 8PM-8AM, please contact night-coverage at [email or web address], password [facility] 08/05/2012, 5:46 PM  LOS: 5 days

## 2012-08-05 NOTE — Progress Notes (Signed)
Subjective:     Continued foot swelling with fever and elevated WBC despite vanc/Zosyn.  Reconsulted by medical team  Activity level:  bedrest Diet tolerance:  ADA diet Voiding:  well Patient reports pain as mild.    Objective: Vital signs in last 24 hours: Temp:  [98.7 F (37.1 C)-100.8 F (38.2 C)] 100 F (37.8 C) (09/23 1300) Pulse Rate:  [75-108] 75  (09/23 1300) Resp:  [16-20] 20  (09/23 1300) BP: (95-153)/(52-78) 95/52 mmHg (09/23 1300) SpO2:  [97 %-100 %] 100 % (09/23 1300)  Labs:  Basename 08/05/12 0340 08/04/12 0430 08/03/12 0423  HGB 8.5* 8.7* 8.8*    Basename 08/05/12 0340 08/04/12 0430  WBC 15.8* 16.5*  RBC 2.83* 2.85*  HCT 25.6* 25.6*  PLT 328 294    Basename 08/05/12 0340 08/04/12 0430  NA 136 135  K 3.8 4.3  CL 100 101  CO2 26 28  BUN 7 6  CREATININE 1.05 0.92  GLUCOSE 105* 103*  CALCIUM 8.6 8.7   No results found for this basename: LABPT:2,INR:2 in the last 72 hours  Physical Exam:  Intact pulses distally Dorsiflexion/Plantar flexion intact Fluctuant area plantar aspect mid arch and another less fluctuant area near second MTP joint also plantar.   Tiny punctate wound under MTP area no drainage Foot still feels warm and is red  PROCEDURE: After sterile prep and drape I use a 11 blade to probe three areas.  The distal area near the plantar aspect of second MTP did not drain at all.  The area under the first MTP likewise returned no fluid.  The more proximal midarch area produced about 5cc of pus.  Used hemostat to probe abscess cavity and packed with gauze.  Tolerated well as she is asensate and performed at bedside.  Assessment/Plan: Diabetic foot infection  Performed I&D at bedside for abscess.  Continue IV antibiotics Vanc/Zosyn Will check tomorrow and change dressing If doesn't respond may need more aggressive I&D in OR     Tyasia Packard G 08/05/2012, 7:15 PM

## 2012-08-05 NOTE — Progress Notes (Signed)
On-call provider notified CBG 77 @ hs / Lantus held. HS snack provided.

## 2012-08-06 DIAGNOSIS — I959 Hypotension, unspecified: Secondary | ICD-10-CM

## 2012-08-06 LAB — GLUCOSE, CAPILLARY
Glucose-Capillary: 207 mg/dL — ABNORMAL HIGH (ref 70–99)
Glucose-Capillary: 203 mg/dL — ABNORMAL HIGH (ref 70–99)
Glucose-Capillary: 173 mg/dL — ABNORMAL HIGH (ref 70–99)
Glucose-Capillary: 141 mg/dL — ABNORMAL HIGH (ref 70–99)

## 2012-08-06 LAB — CBC
Hemoglobin: 8.8 g/dL — ABNORMAL LOW (ref 12.0–15.0)
MCHC: 32.6 g/dL (ref 30.0–36.0)
RDW: 12.9 % (ref 11.5–15.5)
WBC: 14.7 10*3/uL — ABNORMAL HIGH (ref 4.0–10.5)

## 2012-08-06 MED ORDER — SODIUM CHLORIDE 0.9 % IV BOLUS (SEPSIS)
1000.0000 mL | Freq: Once | INTRAVENOUS | Status: AC
  Administered 2012-08-06: 1000 mL via INTRAVENOUS

## 2012-08-06 NOTE — Progress Notes (Signed)
Subjective:     infected right foot  Activity level:  In the bed currently but may be up and do lite walking Diet tolerance:  Eating okay Voiding:  Okay Patient reports pain as 0 on 0-10 scale.    Objective: Vital signs in last 24 hours: Temp:  [97.2 F (36.2 C)-99.8 F (37.7 C)] 97.2 F (36.2 C) (09/24 1400) Pulse Rate:  [79-90] 84  (09/24 1400) Resp:  [18-20] 18  (09/24 1400) BP: (64-138)/(40-80) 132/80 mmHg (09/24 1400) SpO2:  [65 %-100 %] 98 % (09/24 1550)  Labs:  Basename 08/06/12 0355 08/05/12 0340 08/04/12 0430  HGB 8.8* 8.5* 8.7*    Basename 08/06/12 0355 08/05/12 0340  WBC 14.7* 15.8*  RBC 2.94* 2.83*  HCT 27.0* 25.6*  PLT 277 328    Basename 08/05/12 0340 08/04/12 0430  NA 136 135  K 3.8 4.3  CL 100 101  CO2 26 28  BUN 7 6  CREATININE 1.05 0.92  GLUCOSE 105* 103*  CALCIUM 8.6 8.7   No results found for this basename: LABPT:2,INR:2 in the last 72 hours  Physical Exam:  Incision: dressing C/D/I I removed her dressing today. Her foot is less red and there is minimal to no drainage from the proximal incision. I have packed this incision with iodoform gauze maybe 2-3 inches. This was then covered with sterile 4 x 4's and wrapped with a Kerlix. I plan to repack this wound Thursday  Assessment/Plan: We plan to repack her right foot wound on Thursday. She continues antibiotic coverage. I think that it is okay for her to do light walking if necessary. At other times I would keep her foot elevated.         Emric Kowalewski R 08/06/2012, 4:50 PM

## 2012-08-06 NOTE — Progress Notes (Signed)
ANTIBIOTIC CONSULT NOTE - FOLLOW UP  Pharmacy Consult for vancomycin Indication: Diabetic foot ulcer  No Known Allergies  Patient Measurements: Height: 5\' 8"  (172.7 cm) Weight: 156 lb 4.9 oz (70.9 kg) IBW/kg (Calculated) : 63.9  Adjusted Body Weight:   Vital Signs: Temp: 99.8 F (37.7 C) (09/23 2215) Temp src: Oral (09/23 2215) BP: 138/74 mmHg (09/23 2215) Pulse Rate: 90  (09/23 2215) Intake/Output from previous day: 09/23 0701 - 09/24 0700 In: 1692.5 [P.O.:480; I.V.:1162.5; IV Piggyback:50] Out: 300 [Urine:300] Intake/Output from this shift: Total I/O In: 692.5 [I.V.:642.5; IV Piggyback:50] Out: -   Labs:  Basename 08/06/12 0355 08/05/12 0340 08/04/12 0430  WBC 14.7* 15.8* 16.5*  HGB 8.8* 8.5* 8.7*  PLT 277 328 294  LABCREA -- -- --  CREATININE -- 1.05 0.92   Estimated Creatinine Clearance: 65.4 ml/min (by C-G formula based on Cr of 1.05).  Basename 08/05/12 2318  VANCOTROUGH 15.5  VANCOPEAK --  VANCORANDOM --  GENTTROUGH --  GENTPEAK --  GENTRANDOM --  TOBRATROUGH --  TOBRAPEAK --  TOBRARND --  AMIKACINPEAK --  AMIKACINTROU --  AMIKACIN --     Microbiology: Recent Results (from the past 720 hour(s))  WOUND CULTURE     Status: Normal   Collection Time   08/01/12  7:19 AM      Component Value Range Status Comment   Specimen Description FOOT   Final    Special Requests NONE   Final    Gram Stain     Final    Value: FEW WBC PRESENT, PREDOMINANTLY PMN     NO SQUAMOUS EPITHELIAL CELLS SEEN     FEW GRAM NEGATIVE RODS     FEW GRAM POSITIVE COCCI IN PAIRS   Culture     Final    Value: FEW GROUP B STREP(S.AGALACTIAE)ISOLATED     Note: TESTING AGAINST S. AGALACTIAE NOT ROUTINELY PERFORMED DUE TO PREDICTABILITY OF AMP/PEN/VAN SUSCEPTIBILITY.   Report Status 08/03/2012 FINAL   Final   CULTURE, BLOOD (ROUTINE X 2)     Status: Normal (Preliminary result)   Collection Time   08/02/12  4:55 PM      Component Value Range Status Comment   Specimen Description  BLOOD RIGHT HAND   Final    Special Requests BOTTLES DRAWN AEROBIC AND ANAEROBIC 10CC   Final    Culture  Setup Time 08/03/2012 02:40   Final    Culture     Final    Value:        BLOOD CULTURE RECEIVED NO GROWTH TO DATE CULTURE WILL BE HELD FOR 5 DAYS BEFORE ISSUING A FINAL NEGATIVE REPORT   Report Status PENDING   Incomplete   CULTURE, BLOOD (ROUTINE X 2)     Status: Normal (Preliminary result)   Collection Time   08/02/12  5:02 PM      Component Value Range Status Comment   Specimen Description BLOOD RIGHT ARM   Final    Special Requests BOTTLES DRAWN AEROBIC AND ANAEROBIC 10CC   Final    Culture  Setup Time 08/03/2012 02:40   Final    Culture     Final    Value:        BLOOD CULTURE RECEIVED NO GROWTH TO DATE CULTURE WILL BE HELD FOR 5 DAYS BEFORE ISSUING A FINAL NEGATIVE REPORT   Report Status PENDING   Incomplete     Anti-infectives     Start     Dose/Rate Route Frequency Ordered Stop   08/02/12 1200  vancomycin (VANCOCIN) IVPB 1000 mg/200 mL premix        1,000 mg 200 mL/hr over 60 Minutes Intravenous Every 12 hours 08/02/12 1032     08/01/12 0600  piperacillin-tazobactam (ZOSYN) IVPB 3.375 g       3.375 g 12.5 mL/hr over 240 Minutes Intravenous 3 times per day 08/01/12 0207     07/31/12 2245  piperacillin-tazobactam (ZOSYN) IVPB 3.375 g       3.375 g 100 mL/hr over 30 Minutes Intravenous  Once 07/31/12 2232 07/31/12 2328          Assessment: Patient with vancomycin level just above goal.  Level still within PNA dose.  Would rather be high vs. low.  Goal of Therapy:  Vancomycin trough level 10-15 mcg/ml  Plan:  Measure antibiotic drug levels at steady state Follow up culture results Continue with current dose.  Darlina Guys, Jacquenette Shone Crowford 08/06/2012,5:10 AM

## 2012-08-06 NOTE — Progress Notes (Addendum)
[facility] PROGRESS NOTE  Jennifer [name] ZOX:[id number] DOB: 1960-12-24 DOA: 07/31/2012 PCP: No primary provider on file.  Assessment/Plan: Principal Problem:  *Diabetic foot ulcer Active Problems:  DM2 (diabetes mellitus, type 2)  Leukocytosis  Cellulitis  Hypotension -Possibly secondary to sepsis, fluid boluses, continue IV vancomycin and Zosyn. Also obtain procalcitonin and lactic acid level, repeat blood cultures for sepsis workup -Hemoglobin stable, we'll cycle cardiac enzymes and follow. -Improving with fluid resuscitation Altered mental status/decreased responsiveness -Secondary to above/infection, blood glucose within normal limits-173 -Improving with fluid resuscitation. *Diabetic foot ulcer/ Cellulitis  -Status post I&D per orthopedics last pm patient with no further fevers overnight growth so far  -continue vancomycin & Zosyn -Leukocytosis beginning to trend down- -MRI with no evidence of osteomyelitis Active Problems:  DM2 (diabetes mellitus, type 2) with hypoglycemic episodes -BG so far stable, continue current Lantus -Infection and #1 possibly contributing to hypoglycemia, follow. Leukocytosis  - Secondary to above, antibiotics/management as above. 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 Hypokalemia -resolved  Code Status: Full code Family Communication:  Disposition Plan: To home when medically stable   Brief narrative: Ms. Jennifer [name] is a 51 year old with diabetes mellitus admitted with diabetic foot ulcer with cellulitis.  MRI done 9/19 with no evidence of osteomyelitis, and wound care consulted.   Consultants:  Orthopedics-Dr Jerl Santos  Procedures:  I&D on 9/23  Antibiotics:   Vancomycin started on 9/20  Zosyn started on 9/19  HPI/Subjective: Patient with decreased responsiveness and diaphoresis this a.m. she denies chest pain. Status post I&D last  p.m. Objective: Filed Vitals:   08/05/12 2215 08/06/12 0548 08/06/12 0642 08/06/12 0825  BP: 138/74 95/55 126/75 122/78  Pulse: 90 79 85 90  Temp: 99.8 F (37.7 C) 98 F (36.7 C)  97.5 F (36.4 C)  TempSrc: Oral Oral  Oral  Resp: 20 19  18   Height:      Weight:      SpO2: 99% 100%  99%    Intake/Output Summary (Last 24 hours) at 08/06/12 1050 Last data filed at 08/06/12 0900  Gross per 24 hour  Intake 2762.5 ml  Output    300 ml  Net 2462.5 ml   Filed Weights   07/31/12 2000 08/01/12 0415  Weight: 70.761 kg (156 lb) 70.9 kg (156 lb 4.9 oz)    Exam:   General: Middle aged black female decreased responsiveness, opened eyes briefly to sternal rub  Cardiovascular: Regular rate and rhythm normal S1-S2  Respiratory: Clear to auscultation bilaterally no crackles or wheezes Extremities: Right foot edematous, status post I&D last p.m. with dressing clean and dry. Data Reviewed: Basic Metabolic Panel:  Lab 08/05/12 8119 08/04/12 0430 08/03/12 0423 08/01/12 0640 07/31/12 2230  NA 136 135 135 135 132*  K 3.8 4.3 3.0* 3.2* 3.5  CL 100 101 100 99 95*  CO2 26 28 29 28 25   GLUCOSE 105* 103* 142* 131* 110*  BUN 7 6 7 9 10   CREATININE 1.05 0.92 0.85 0.72 0.69  CALCIUM 8.6 8.7 8.5 8.5 9.2  MG -- -- -- -- --  PHOS -- -- -- -- --   Liver Function Tests: No results found for this basename: AST:5,ALT:5,ALKPHOS:5,BILITOT:5,PROT:5,ALBUMIN:5 in the last 168 hours No results found for this basename: LIPASE:5,AMYLASE:5 in the last 168 hours No results found for this basename: AMMONIA:5 in the last 168 hours CBC:  Lab 08/06/12 0355 08/05/12 0340 08/04/12 0430 08/03/12 0423 08/02/12 0330 07/31/12 2230  WBC 14.7*  15.8* 16.5* 12.1* 13.1* --  NEUTROABS -- -- -- -- -- 10.2*  HGB 8.8* 8.5* 8.7* 8.8* 9.1* --  HCT 27.0* 25.6* 25.6* 25.9* 27.0* --  MCV 91.8 90.5 89.8 89.3 89.4 --  PLT 277 328 294 297 281 --   Cardiac Enzymes: No results found for this basename:  CKTOTAL:5,CKMB:5,CKMBINDEX:5,TROPONINI:5 in the last 168 hours BNP (last 3 results) No results found for this basename: PROBNP:3 in the last 8760 hours CBG:  Lab 08/06/12 0811 08/06/12 0627 08/05/12 2206 08/05/12 1711 08/05/12 1217  GLUCAP 203* 207* 106* 151* 168*    Recent Results (from the past 240 hour(s))  WOUND CULTURE     Status: Normal   Collection Time   08/01/12  7:19 AM      Component Value Range Status Comment   Specimen Description FOOT   Final    Special Requests NONE   Final    Gram Stain     Final    Value: FEW WBC PRESENT, PREDOMINANTLY PMN     NO SQUAMOUS EPITHELIAL CELLS SEEN     FEW GRAM NEGATIVE RODS     FEW GRAM POSITIVE COCCI IN PAIRS   Culture     Final    Value: FEW GROUP B STREP(S.AGALACTIAE)ISOLATED     Note: TESTING AGAINST S. AGALACTIAE NOT ROUTINELY PERFORMED DUE TO PREDICTABILITY OF AMP/PEN/VAN SUSCEPTIBILITY.   Report Status 08/03/2012 FINAL   Final   CULTURE, BLOOD (ROUTINE X 2)     Status: Normal (Preliminary result)   Collection Time   08/02/12  4:55 PM      Component Value Range Status Comment   Specimen Description BLOOD RIGHT HAND   Final    Special Requests BOTTLES DRAWN AEROBIC AND ANAEROBIC 10CC   Final    Culture  Setup Time 08/03/2012 02:40   Final    Culture     Final    Value:        BLOOD CULTURE RECEIVED NO GROWTH TO DATE CULTURE WILL BE HELD FOR 5 DAYS BEFORE ISSUING A FINAL NEGATIVE REPORT   Report Status PENDING   Incomplete   CULTURE, BLOOD (ROUTINE X 2)     Status: Normal (Preliminary result)   Collection Time   08/02/12  5:02 PM      Component Value Range Status Comment   Specimen Description BLOOD RIGHT ARM   Final    Special Requests BOTTLES DRAWN AEROBIC AND ANAEROBIC 10CC   Final    Culture  Setup Time 08/03/2012 02:40   Final    Culture     Final    Value:        BLOOD CULTURE RECEIVED NO GROWTH TO DATE CULTURE WILL BE HELD FOR 5 DAYS BEFORE ISSUING A FINAL NEGATIVE REPORT   Report Status PENDING   Incomplete       Studies: No results found.  Scheduled Meds:    . heparin  5,000 Units Subcutaneous Q8H  . insulin aspart  0-15 Units Subcutaneous TID WC  . insulin glargine  15 Units Subcutaneous BID  . piperacillin-tazobactam (ZOSYN)  IV  3.375 g Intravenous Q8H  . vancomycin  1,000 mg Intravenous Q12H   Continuous Infusions:    . sodium chloride 75 mL/hr at 08/06/12 0255    Principal Problem:  *Diabetic foot ulcer Active Problems:  DM2 (diabetes mellitus, type 2)  Leukocytosis  Cellulitis    Time spent: critical care time 1hr    [facility]  [facility] Pager [phone]. If 8PM-8AM, please  contact night-coverage at [email or web address], password [facility] 08/06/2012, 10:50 AM  LOS: 6 days

## 2012-08-06 NOTE — Progress Notes (Signed)
Patient was found to be unresponsive during rounds. Md in room and aware of situation.  BP 64/40, CBG 173.  Bolus of 1000cc started.  Patient observed to be diaphoretic and bed soaked through. Patient slowly became responsive through out bolus infusion and was a/o X3 and able to communicate with staff.   Will continue to monitor.

## 2012-08-07 DIAGNOSIS — D649 Anemia, unspecified: Secondary | ICD-10-CM

## 2012-08-07 DIAGNOSIS — I959 Hypotension, unspecified: Secondary | ICD-10-CM | POA: Diagnosis not present

## 2012-08-07 DIAGNOSIS — E876 Hypokalemia: Secondary | ICD-10-CM | POA: Diagnosis present

## 2012-08-07 LAB — GLUCOSE, CAPILLARY
Glucose-Capillary: 82 mg/dL (ref 70–99)
Glucose-Capillary: 80 mg/dL (ref 70–99)
Glucose-Capillary: 84 mg/dL (ref 70–99)
Glucose-Capillary: 61 mg/dL — ABNORMAL LOW (ref 70–99)
Glucose-Capillary: 76 mg/dL (ref 70–99)

## 2012-08-07 LAB — CBC
MCH: 30.4 pg (ref 26.0–34.0)
Platelets: 292 10*3/uL (ref 150–400)
RBC: 2.5 MIL/uL — ABNORMAL LOW (ref 3.87–5.11)
RDW: 12.9 % (ref 11.5–15.5)
WBC: 12.8 10*3/uL — ABNORMAL HIGH (ref 4.0–10.5)

## 2012-08-07 LAB — BASIC METABOLIC PANEL
CO2: 26 mEq/L (ref 19–32)
Calcium: 8.1 mg/dL — ABNORMAL LOW (ref 8.4–10.5)
GFR calc Af Amer: 47 mL/min — ABNORMAL LOW (ref >90)
Sodium: 139 mEq/L (ref 135–145)

## 2012-08-07 MED ORDER — POTASSIUM CHLORIDE CRYS ER 20 MEQ PO TBCR
40.0000 meq | EXTENDED_RELEASE_TABLET | Freq: Two times a day (BID) | ORAL | Status: AC
  Administered 2012-08-07 – 2012-08-08 (×3): 40 meq via ORAL
  Filled 2012-08-07 (×4): qty 2

## 2012-08-07 MED ORDER — INSULIN GLARGINE 100 UNIT/ML ~~LOC~~ SOLN: 15.0000 [IU] | Freq: Every day | SUBCUTANEOUS | Status: DC

## 2012-08-07 NOTE — Progress Notes (Signed)
Hypoglycemic Event  CBG: 49  Treatment: 15 GM carbohydrate snack  Symptoms: None  Follow-up CBG: Time:1222 CBG Result:49  Possible Reasons for Event: Inadequate meal intake  Comments/MD notified:    Armanda Heritage  Remember to initiate Hypoglycemia Order Set & complete

## 2012-08-07 NOTE — Progress Notes (Signed)
[facility] Progress Note  08/07/2012  HPI Jennifer [name] is a 51 y.o. female with PMH of DM2 diagnosed 13 years ago (does not know her A1C), who tried to cut what she thought was a callus off of her foot 2 weeks ago. She thought nothing of it until 2 days ago when her foot and ankle began to swell up. Finally today she noticed drainage through her sock and a very foul smell when she took her sock off. This prompted the patient to present to the ED for treatment. Evaluation in the ED demonstrated a non painful diabetic foot ulcer with purulent drainage and obvious edema of leg and erythema around ulcer. Lab work up showed leukocytosis, X-ray of foot didn't show any obvious osteomylitis, patient started on zosyn hospitalist has been asked to admit for IV antibiotics.  Subjective: Pt had some headache this morning but says she is feeling much better and plans to try ambulating today.   Objective:  Vital signs in last 24 hours: Filed Vitals:   08/06/12 1400 08/06/12 1550 08/06/12 2214 08/07/12 0519  BP: 132/80  104/55 124/63  Pulse: 84  77 72  Temp: 97.2 F (36.2 C)  98 F (36.7 C) 99.1 F (37.3 C)  TempSrc: Oral  Oral Oral  Resp: 18  18 18   Height:      Weight:      SpO2: 99% 98% 96% 92%   Weight change:   Intake/Output Summary (Last 24 hours) at 08/07/12 1118 Last data filed at 08/07/12 0900  Gross per 24 hour  Intake   6862 ml  Output      0 ml  Net   6862 ml   Lab Results  Component Value Date   HGBA1C 6.7* 07/31/2012   Lab Results  Component Value Date   CREATININE 1.48* 08/07/2012    Review of Systems As above, otherwise all reviewed and reported negative  Physical Exam General - awake, no distress, cooperative HEENT - NCAT, MMM Lungs - BBS, CTA CV - normal s1, s2 sounds Abd - soft, nondistended, no masses, nontender Ext - Right foot edematous, status post I&D last p.m. with dressing clean and dry  Lab Results: Results for orders placed during the  hospital encounter of 07/31/12 (from the past 24 hour(s))  GLUCOSE, CAPILLARY     Status: Abnormal   Collection Time   08/06/12 11:28 AM      Component Value Range   Glucose-Capillary 173 (*) 70 - 99 mg/dL  CULTURE, BLOOD (ROUTINE X 2)     Status: Normal (Preliminary result)   Collection Time   08/06/12 12:11 PM      Component Value Range   Specimen Description BLOOD RIGHT ARM     Special Requests BOTTLES DRAWN AEROBIC AND ANAEROBIC 5CC     Culture  Setup Time 08/06/2012 15:56     Culture       Value:        BLOOD CULTURE RECEIVED NO GROWTH TO DATE CULTURE WILL BE HELD FOR 5 DAYS BEFORE ISSUING A FINAL NEGATIVE REPORT   Report Status PENDING    PROCALCITONIN     Status: Normal   Collection Time   08/06/12 12:11 PM      Component Value Range   Procalcitonin 1.52    LACTIC ACID, PLASMA     Status: Normal   Collection Time   08/06/12 12:11 PM      Component Value Range   Lactic Acid, Venous 1.5  0.5 -  2.2 mmol/L  TROPONIN I     Status: Normal   Collection Time   08/06/12 12:11 PM      Component Value Range   Troponin I <0.30  <0.30 ng/mL  CULTURE, BLOOD (ROUTINE X 2)     Status: Normal (Preliminary result)   Collection Time   08/06/12 12:17 PM      Component Value Range   Specimen Description BLOOD RIGHT HAND     Special Requests BOTTLES DRAWN AEROBIC ONLY 4CC     Culture  Setup Time 08/06/2012 15:56     Culture       Value:        BLOOD CULTURE RECEIVED NO GROWTH TO DATE CULTURE WILL BE HELD FOR 5 DAYS BEFORE ISSUING A FINAL NEGATIVE REPORT   Report Status PENDING    GLUCOSE, CAPILLARY     Status: Abnormal   Collection Time   08/06/12  1:01 PM      Component Value Range   Glucose-Capillary 158 (*) 70 - 99 mg/dL   Comment 1 Documented in Chart     Comment 2 Notify RN    OCCULT BLOOD X 1 CARD TO LAB, STOOL     Status: Normal   Collection Time   08/06/12  3:48 PM      Component Value Range   Fecal Occult Bld NEGATIVE    GLUCOSE, CAPILLARY     Status: Abnormal   Collection  Time   08/06/12  5:22 PM      Component Value Range   Glucose-Capillary 141 (*) 70 - 99 mg/dL   Comment 1 Notify RN    GLUCOSE, CAPILLARY     Status: Normal   Collection Time   08/06/12 10:04 PM      Component Value Range   Glucose-Capillary 82  70 - 99 mg/dL  CBC     Status: Abnormal   Collection Time   08/07/12  3:25 AM      Component Value Range   WBC 12.8 (*) 4.0 - 10.5 K/uL   RBC 2.50 (*) 3.87 - 5.11 MIL/uL   Hemoglobin 7.6 (*) 12.0 - 15.0 g/dL   HCT 16.1 (*) 09.6 - 04.5 %   MCV 90.0  78.0 - 100.0 fL   MCH 30.4  26.0 - 34.0 pg   MCHC 33.8  30.0 - 36.0 g/dL   RDW 40.9  81.1 - 91.4 %   Platelets 292  150 - 400 K/uL  BASIC METABOLIC PANEL     Status: Abnormal   Collection Time   08/07/12  3:25 AM      Component Value Range   Sodium 139  135 - 145 mEq/L   Potassium 3.2 (*) 3.5 - 5.1 mEq/L   Chloride 105  96 - 112 mEq/L   CO2 26  19 - 32 mEq/L   Glucose, Bld 95  70 - 99 mg/dL   BUN 13  6 - 23 mg/dL   Creatinine, Ser 7.82 (*) 0.50 - 1.10 mg/dL   Calcium 8.1 (*) 8.4 - 10.5 mg/dL   GFR calc non Af Amer 40 (*) >90 mL/min   GFR calc Af Amer 47 (*) >90 mL/min  GLUCOSE, CAPILLARY     Status: Normal   Collection Time   08/07/12  6:37 AM      Component Value Range   Glucose-Capillary 80  70 - 99 mg/dL    Micro Results: Recent Results (from the past 240 hour(s))  WOUND CULTURE  Status: Normal   Collection Time   08/01/12  7:19 AM      Component Value Range Status Comment   Specimen Description FOOT   Final    Special Requests NONE   Final    Gram Stain     Final    Value: FEW WBC PRESENT, PREDOMINANTLY PMN     NO SQUAMOUS EPITHELIAL CELLS SEEN     FEW GRAM NEGATIVE RODS     FEW GRAM POSITIVE COCCI IN PAIRS   Culture     Final    Value: FEW GROUP B STREP(S.AGALACTIAE)ISOLATED     Note: TESTING AGAINST S. AGALACTIAE NOT ROUTINELY PERFORMED DUE TO PREDICTABILITY OF AMP/PEN/VAN SUSCEPTIBILITY.   Report Status 08/03/2012 FINAL   Final   CULTURE, BLOOD (ROUTINE X 2)      Status: Normal (Preliminary result)   Collection Time   08/02/12  4:55 PM      Component Value Range Status Comment   Specimen Description BLOOD RIGHT HAND   Final    Special Requests BOTTLES DRAWN AEROBIC AND ANAEROBIC 10CC   Final    Culture  Setup Time 08/03/2012 02:40   Final    Culture     Final    Value:        BLOOD CULTURE RECEIVED NO GROWTH TO DATE CULTURE WILL BE HELD FOR 5 DAYS BEFORE ISSUING A FINAL NEGATIVE REPORT   Report Status PENDING   Incomplete   CULTURE, BLOOD (ROUTINE X 2)     Status: Normal (Preliminary result)   Collection Time   08/02/12  5:02 PM      Component Value Range Status Comment   Specimen Description BLOOD RIGHT ARM   Final    Special Requests BOTTLES DRAWN AEROBIC AND ANAEROBIC 10CC   Final    Culture  Setup Time 08/03/2012 02:40   Final    Culture     Final    Value:        BLOOD CULTURE RECEIVED NO GROWTH TO DATE CULTURE WILL BE HELD FOR 5 DAYS BEFORE ISSUING A FINAL NEGATIVE REPORT   Report Status PENDING   Incomplete   CULTURE, BLOOD (ROUTINE X 2)     Status: Normal (Preliminary result)   Collection Time   08/06/12 12:11 PM      Component Value Range Status Comment   Specimen Description BLOOD RIGHT ARM   Final    Special Requests BOTTLES DRAWN AEROBIC AND ANAEROBIC 5CC   Final    Culture  Setup Time 08/06/2012 15:56   Final    Culture     Final    Value:        BLOOD CULTURE RECEIVED NO GROWTH TO DATE CULTURE WILL BE HELD FOR 5 DAYS BEFORE ISSUING A FINAL NEGATIVE REPORT   Report Status PENDING   Incomplete   CULTURE, BLOOD (ROUTINE X 2)     Status: Normal (Preliminary result)   Collection Time   08/06/12 12:17 PM      Component Value Range Status Comment   Specimen Description BLOOD RIGHT HAND   Final    Special Requests BOTTLES DRAWN AEROBIC ONLY 4CC   Final    Culture  Setup Time 08/06/2012 15:56   Final    Culture     Final    Value:        BLOOD CULTURE RECEIVED NO GROWTH TO DATE CULTURE WILL BE HELD FOR 5 DAYS BEFORE ISSUING A FINAL  NEGATIVE REPORT   Report Status PENDING  Incomplete     Medications:  Scheduled Meds:   . heparin  5,000 Units Subcutaneous Q8H  . insulin aspart  0-15 Units Subcutaneous TID WC  . insulin glargine  15 Units Subcutaneous BID  . piperacillin-tazobactam (ZOSYN)  IV  3.375 g Intravenous Q8H  . potassium chloride  40 mEq Oral BID  . sodium chloride  1,000 mL Intravenous Once  . vancomycin  1,000 mg Intravenous Q12H   Continuous Infusions:   . sodium chloride 150 mL/hr at 08/07/12 0522   PRN Meds:.acetaminophen, ondansetron, oxyCODONE-acetaminophen, zolpidem  Assessment/Plan:  Hypotension  -Possibly secondary to sepsis, fluid boluses, continue IV vancomycin and Zosyn. Negative procalcitonin and lactic acid levels,  repeated blood cultures for sepsis workup on 9/24 negative to date -Hemoglobin stable, cardiac enzymes negative.  -Improving with fluid resuscitation   Altered mental status/decreased responsiveness  -Secondary to above/infection, blood glucose within normal limits-173  -Improving with fluid resuscitation.   Diabetic foot ulcer/ Cellulitis  -Status post I&D per orthopedics last pm patient with no further fevers overnight growth so far  -continue vancomycin & Zosyn  -Leukocytosis beginning to trend down-  -MRI with no evidence of osteomyelitis  -Surgery plans to repack wound tomorrow 9/26  DM2 (diabetes mellitus, type 2) with hypoglycemic episodes  -BG so far stable, continue current Lantus  -Infection and #1 possibly contributing to hypoglycemia, follow.   Normocytic Anemia, multifactorial - hemoccult of stool negative - following  Leukocytosis  - trending down today, following - Secondary to above, antibiotics/management as above.   Hypokalemia  -repleting orally today  Code Status: Full code  Family Communication:  Disposition Plan: To home when medically stable   LOS: 7 days   Jennifer [name] 08/07/2012, 11:18 AM  Cleora Fleet, MD, CDE,  FAAFP [facility] [place], [place]  [phone]

## 2012-08-07 NOTE — Progress Notes (Addendum)
Hypoglycemic Event  CBG: 51  Treatment: 15 GM carbohydrate snack  Symptoms: None  Follow-up CBG: Time:1313 CBG Result:87  Possible Reasons for Event: Inadequate meal intake  Comments/MD notified:    Armanda Heritage  Remember to initiate Hypoglycemia Order Set & complete

## 2012-08-07 NOTE — Progress Notes (Signed)
Hypoglycemic Event  CBG: 49  Treatment: 15 GM carbohydrate snack  Symptoms: None  Follow-up CBG: Time:1247 CBG Result:51  Possible Reasons for Event: Inadequate meal intake  Comments/MD notified:    Armanda Heritage  Remember to initiate Hypoglycemia Order Set & complete

## 2012-08-08 ENCOUNTER — Inpatient Hospital Stay (HOSPITAL_COMMUNITY): Payer: BC Managed Care – PPO

## 2012-08-08 DIAGNOSIS — E876 Hypokalemia: Secondary | ICD-10-CM

## 2012-08-08 DIAGNOSIS — R748 Abnormal levels of other serum enzymes: Secondary | ICD-10-CM | POA: Diagnosis not present

## 2012-08-08 DIAGNOSIS — R112 Nausea with vomiting, unspecified: Secondary | ICD-10-CM | POA: Diagnosis not present

## 2012-08-08 LAB — COMPREHENSIVE METABOLIC PANEL
ALT: 1075 U/L — ABNORMAL HIGH (ref 0–35)
Alkaline Phosphatase: 177 U/L — ABNORMAL HIGH (ref 39–117)
BUN: 6 mg/dL (ref 6–23)
CO2: 25 mEq/L (ref 19–32)
Chloride: 105 mEq/L (ref 96–112)
GFR calc Af Amer: 63 mL/min — ABNORMAL LOW (ref >90)
GFR calc non Af Amer: 54 mL/min — ABNORMAL LOW (ref >90)
Glucose, Bld: 73 mg/dL (ref 70–99)
Potassium: 4.1 mEq/L (ref 3.5–5.1)
Sodium: 137 mEq/L (ref 135–145)
Total Bilirubin: 1 mg/dL (ref 0.3–1.2)

## 2012-08-08 LAB — CBC
MCHC: 33.5 g/dL (ref 30.0–36.0)
Platelets: 349 10*3/uL (ref 150–400)
RDW: 12.7 % (ref 11.5–15.5)
WBC: 15.1 10*3/uL — ABNORMAL HIGH (ref 4.0–10.5)

## 2012-08-08 LAB — GLUCOSE, CAPILLARY
Glucose-Capillary: 93 mg/dL (ref 70–99)
Glucose-Capillary: 74 mg/dL (ref 70–99)
Glucose-Capillary: 109 mg/dL — ABNORMAL HIGH (ref 70–99)
Glucose-Capillary: 112 mg/dL — ABNORMAL HIGH (ref 70–99)

## 2012-08-08 LAB — VANCOMYCIN, TROUGH: Vancomycin Tr: 24.6 ug/mL — ABNORMAL HIGH (ref 10.0–20.0)

## 2012-08-08 MED ORDER — INSULIN GLARGINE 100 UNIT/ML ~~LOC~~ SOLN
12.0000 [IU] | Freq: Every day | SUBCUTANEOUS | Status: DC
  Administered 2012-08-09 – 2012-08-14 (×6): 12 [IU] via SUBCUTANEOUS

## 2012-08-08 MED ORDER — DEXTROSE-NACL 5-0.9 % IV SOLN
INTRAVENOUS | Status: DC
  Administered 2012-08-08: 12:00:00 via INTRAVENOUS
  Administered 2012-08-09: 1000 mL via INTRAVENOUS
  Administered 2012-08-10 – 2012-08-12 (×5): via INTRAVENOUS

## 2012-08-08 MED ORDER — OXYCODONE-ACETAMINOPHEN 5-325 MG PO TABS
1.0000 | ORAL_TABLET | ORAL | Status: DC | PRN
  Administered 2012-08-11 – 2012-08-18 (×3): 1 via ORAL
  Filled 2012-08-08 (×4): qty 1

## 2012-08-08 MED ORDER — VANCOMYCIN HCL IN DEXTROSE 1-5 GM/200ML-% IV SOLN
1000.0000 mg | INTRAVENOUS | Status: DC
  Administered 2012-08-08 – 2012-08-09 (×2): 1000 mg via INTRAVENOUS
  Filled 2012-08-08 (×2): qty 200

## 2012-08-08 MED ORDER — PROMETHAZINE HCL 25 MG/ML IJ SOLN
12.5000 mg | Freq: Four times a day (QID) | INTRAMUSCULAR | Status: DC | PRN
  Administered 2012-08-08 – 2012-08-17 (×12): 12.5 mg via INTRAVENOUS
  Administered 2012-08-17: 11:00:00 via INTRAVENOUS
  Administered 2012-08-18 – 2012-08-26 (×16): 12.5 mg via INTRAVENOUS
  Filled 2012-08-08 (×29): qty 1

## 2012-08-08 NOTE — Progress Notes (Signed)
ANTIBIOTIC CONSULT NOTE - FOLLOW UP  Pharmacy Consult for vancomycin Indication: Diabetic foot ulcer  No Known Allergies  Patient Measurements: Height: 5\' 8"  (172.7 cm) Weight: 156 lb 4.9 oz (70.9 kg) IBW/kg (Calculated) : 63.9  Adjusted Body Weight:   Vital Signs: Temp: 99.2 F (37.3 C) (09/25 2116) Temp src: Oral (09/25 2116) BP: 162/81 mmHg (09/25 2116) Pulse Rate: 95  (09/25 2116) Intake/Output from previous day: 09/25 0701 - 09/26 0700 In: 2117.5 [P.O.:720; I.V.:1297.5] Out: 1550 [Urine:1550] Intake/Output from this shift: Total I/O In: 340 [P.O.:240; Other:100] Out: 750 [Urine:750]  Labs:  [facility] 08/07/12 0325 08/06/12 0355 08/05/12 0340  WBC 12.8* 14.7* 15.8*  HGB 7.6* 8.8* 8.5*  PLT 292 277 328  LABCREA -- -- --  CREATININE 1.48* -- 1.05   Estimated Creatinine Clearance: 46.4 ml/min (by C-G formula based on Cr of 1.48).  Basename 08/07/12 2305 08/05/12 2318  VANCOTROUGH 24.6* 15.5  VANCOPEAK -- --  Drue Dun -- --  GENTTROUGH -- --  GENTPEAK -- --  GENTRANDOM -- --  TOBRATROUGH -- --  TOBRAPEAK -- --  TOBRARND -- --  AMIKACINPEAK -- --  AMIKACINTROU -- --  AMIKACIN -- --     Microbiology: Recent Results (from the past 720 hour(s))  WOUND CULTURE     Status: Normal   Collection Time   08/01/12  7:19 AM      Component Value Range Status Comment   Specimen Description FOOT   Final    Special Requests NONE   Final    Gram Stain     Final    Value: FEW WBC PRESENT, PREDOMINANTLY PMN     NO SQUAMOUS EPITHELIAL CELLS SEEN     FEW GRAM NEGATIVE RODS     FEW GRAM POSITIVE COCCI IN PAIRS   Culture     Final    Value: FEW GROUP B STREP(S.AGALACTIAE)ISOLATED     Note: TESTING AGAINST S. AGALACTIAE NOT ROUTINELY PERFORMED DUE TO PREDICTABILITY OF AMP/PEN/VAN SUSCEPTIBILITY.   Report Status 08/03/2012 FINAL   Final   CULTURE, BLOOD (ROUTINE X 2)     Status: Normal (Preliminary result)   Collection Time   08/02/12  4:55 PM      Component Value  Range Status Comment   Specimen Description BLOOD RIGHT HAND   Final    Special Requests BOTTLES DRAWN AEROBIC AND ANAEROBIC 10CC   Final    Culture  Setup Time 08/03/2012 02:40   Final    Culture     Final    Value:        BLOOD CULTURE RECEIVED NO GROWTH TO DATE CULTURE WILL BE HELD FOR 5 DAYS BEFORE ISSUING A FINAL NEGATIVE REPORT   Report Status PENDING   Incomplete   CULTURE, BLOOD (ROUTINE X 2)     Status: Normal (Preliminary result)   Collection Time   08/02/12  5:02 PM      Component Value Range Status Comment   Specimen Description BLOOD RIGHT ARM   Final    Special Requests BOTTLES DRAWN AEROBIC AND ANAEROBIC 10CC   Final    Culture  Setup Time 08/03/2012 02:40   Final    Culture     Final    Value:        BLOOD CULTURE RECEIVED NO GROWTH TO DATE CULTURE WILL BE HELD FOR 5 DAYS BEFORE ISSUING A FINAL NEGATIVE REPORT   Report Status PENDING   Incomplete   CULTURE, BLOOD (ROUTINE X 2)     Status:  Normal (Preliminary result)   Collection Time   08/06/12 12:11 PM      Component Value Range Status Comment   Specimen Description BLOOD RIGHT ARM   Final    Special Requests BOTTLES DRAWN AEROBIC AND ANAEROBIC 5CC   Final    Culture  Setup Time 08/06/2012 15:56   Final    Culture     Final    Value:        BLOOD CULTURE RECEIVED NO GROWTH TO DATE CULTURE WILL BE HELD FOR 5 DAYS BEFORE ISSUING A FINAL NEGATIVE REPORT   Report Status PENDING   Incomplete   CULTURE, BLOOD (ROUTINE X 2)     Status: Normal (Preliminary result)   Collection Time   08/06/12 12:17 PM      Component Value Range Status Comment   Specimen Description BLOOD RIGHT HAND   Final    Special Requests BOTTLES DRAWN AEROBIC ONLY 4CC   Final    Culture  Setup Time 08/06/2012 15:56   Final    Culture     Final    Value:        BLOOD CULTURE RECEIVED NO GROWTH TO DATE CULTURE WILL BE HELD FOR 5 DAYS BEFORE ISSUING A FINAL NEGATIVE REPORT   Report Status PENDING   Incomplete     Anti-infectives     Start     Dose/Rate  Route Frequency Ordered Stop   08/02/12 1200   vancomycin (VANCOCIN) IVPB 1000 mg/200 mL premix        1,000 mg 200 mL/hr over 60 Minutes Intravenous Every 12 hours 08/02/12 1032     08/01/12 0600   piperacillin-tazobactam (ZOSYN) IVPB 3.375 g        3.375 g 12.5 mL/hr over 240 Minutes Intravenous 3 times per day 08/01/12 0207     07/31/12 2245   piperacillin-tazobactam (ZOSYN) IVPB 3.375 g        3.375 g 100 mL/hr over 30 Minutes Intravenous  Once 07/31/12 2232 07/31/12 2328          Assessment:  VT = 24.6 mcg/ml, above desired goal!  SCr rising  1.05>>1.48  Goal of Therapy:  Vancomycin trough level 10-15 mcg/ml  Plan:   Change Vancomycin to 1Gm IV q24h.  F/U SCr/levels as needed.  Susanne Greenhouse R 08/08/2012,12:29 AM

## 2012-08-08 NOTE — Progress Notes (Signed)
Wound looks great No sig drainage Cavity much smaller ] Repacked with iodoform Can change this daily Will check on her again on Monday if she is still here If discharged would love to see her in office in week or so.

## 2012-08-08 NOTE — Progress Notes (Signed)
I spoke with GI on call, pt's clinical presentation highly suspicious for shock liver, will get [place], acute hep panel, follow daily liver enzymes.  Maryln Manuel, MD

## 2012-08-08 NOTE — Care Management Note (Unsigned)
    Page 1 of 2   08/22/2012     1:40:10 PM   CARE MANAGEMENT NOTE 08/22/2012  Patient:  Jennifer [name], Jennifer [name]   Account Number:  [id number]  Date Initiated:  08/08/2012  Documentation initiated by:  Jennifer [name]  Subjective/Objective Assessment:   Patient admitted with diabetic foot ulcer.     Action/Plan:   Patient will be discharged to home when medically stable.   Anticipated DC Date:  08/26/2012   Anticipated DC Plan:  SKILLED NURSING FACILITY  In-house referral  PCP / [facility]      DC Planning Services  CM consult      Choice offered to / List presented to:             [facility] agency  [facility]  [facility]   Status of service:  In process, will continue to follow Medicare Important Message given?   (If response is "NO", the following Medicare IM given date fields will be blank) Date Medicare IM given:   Date Additional Medicare IM given:    Discharge Disposition:    Per UR Regulation:  Reviewed for med. necessity/level of care/duration of stay  If discussed at Long Length of Stay Meetings, dates discussed:   08/08/2012  08/22/2012    Comments:  08/22/12 Jennifer Atlas RN,BSN NCM [phone] N/V-REGLAN IV ADJUSTMENTS,NOT EATING,GI FOLLOWING.GASTROPARESIS.ERYTHROMYCIN.?NGT/?TPN.D/C PLAN SNF  08/21/12 Jennifer Hunger RN,BSN NCM [phone] VOMITING,GI FOLLOWING.ORTHOSTATIC.D/C PLAN SNF.  08/08/2012  3:20pm  Jennifer Felix RN, case manager   [phone] Met with patient to discuss home health needs. She stated that if she had to have home health that [facility] would be agreeable to her. Still waiting on PT/OT evaluation and recommendations. Patient has also requested a bedside commode because the bathroom is quite a distance down the hall from her bedroom. With her permission, I have also referred the patient to Jennifer [name] of [facility].

## 2012-08-08 NOTE — Progress Notes (Signed)
[facility] Progress Note  08/08/2012  HPI  Jennifer [name] is a 51 y.o. female with PMH of DM2 diagnosed 13 years ago (does not know her A1C), who tried to cut what she thought was a callus off of her foot 2 weeks ago. She thought nothing of it until 2 days ago when her foot and ankle began to swell up. Finally today she noticed drainage through her sock and a very foul smell when she took her sock off. This prompted the patient to present to the ED for treatment. Evaluation in the ED demonstrated a non painful diabetic foot ulcer with purulent drainage and obvious edema of leg and erythema around ulcer. Lab work up showed leukocytosis, X-ray of foot didn't show any obvious osteomylitis, patient started on zosyn hospitalist has been asked to admit for IV antibiotics.  Subjective: Pt reports increasing difficulty with eating because of severe nausea, no vomiting, increasing urination and malaise, denies any prior history of liver disease  Objective:  Vital signs in last 24 hours: Filed Vitals:   08/07/12 0930 08/07/12 1451 08/07/12 2116 08/08/12 0647  BP: 164/90 132/101 162/81 160/90  Pulse: 91 86 95 92  Temp: 98.4 F (36.9 C) 97.6 F (36.4 C) 99.2 F (37.3 C) 98.8 F (37.1 C)  TempSrc: Oral Oral Oral Oral  Resp: 17 16 18 17   Height:      Weight:      SpO2: 92% 97% 93% 96%   Weight change:   Intake/Output Summary (Last 24 hours) at 08/08/12 1045 Last data filed at 08/08/12 4782  Gross per 24 hour  Intake 2047.5 ml  Output   1800 ml  Net  247.5 ml   Lab Results  Component Value Date   HGBA1C 6.7* 07/31/2012   Lab Results  Component Value Date   CREATININE 1.16* 08/08/2012    Review of Systems As above, otherwise all reviewed and reported negative  Physical Exam General - awake, no distress, cooperative  HEENT - NCAT, MMM Lungs - BBS, CTA  CV - normal s1, s2 sounds  Abd - soft, nondistended, no masses, nontender  Ext - Right foot edematous, status post I&D last  p.m. with dressing clean and dry  Lab Results: Results for orders placed during the hospital encounter of 07/31/12 (from the past 24 hour(s))  GLUCOSE, CAPILLARY     Status: Abnormal   Collection Time   08/07/12 11:51 AM      Component Value Range   Glucose-Capillary 49 (*) 70 - 99 mg/dL  GLUCOSE, CAPILLARY     Status: Abnormal   Collection Time   08/07/12 12:22 PM      Component Value Range   Glucose-Capillary 49 (*) 70 - 99 mg/dL  GLUCOSE, CAPILLARY     Status: Abnormal   Collection Time   08/07/12 12:46 PM      Component Value Range   Glucose-Capillary 51 (*) 70 - 99 mg/dL  GLUCOSE, CAPILLARY     Status: Normal   Collection Time   08/07/12  1:12 PM      Component Value Range   Glucose-Capillary 87  70 - 99 mg/dL  GLUCOSE, CAPILLARY     Status: Normal   Collection Time   08/07/12  5:23 PM      Component Value Range   Glucose-Capillary 84  70 - 99 mg/dL   Comment 1 Documented in Chart     Comment 2 Notify RN    GLUCOSE, CAPILLARY     Status:  Abnormal   Collection Time   08/07/12  9:11 PM      Component Value Range   Glucose-Capillary 61 (*) 70 - 99 mg/dL  GLUCOSE, CAPILLARY     Status: Normal   Collection Time   08/07/12 10:43 PM      Component Value Range   Glucose-Capillary 76  70 - 99 mg/dL  VANCOMYCIN, TROUGH     Status: Abnormal   Collection Time   08/07/12 11:05 PM      Component Value Range   Vancomycin Tr 24.6 (*) 10.0 - 20.0 ug/mL  GLUCOSE, CAPILLARY     Status: Normal   Collection Time   08/08/12  3:13 AM      Component Value Range   Glucose-Capillary 71  70 - 99 mg/dL  COMPREHENSIVE METABOLIC PANEL     Status: Abnormal   Collection Time   08/08/12  3:35 AM      Component Value Range   Sodium 137  135 - 145 mEq/L   Potassium 4.1  3.5 - 5.1 mEq/L   Chloride 105  96 - 112 mEq/L   CO2 25  19 - 32 mEq/L   Glucose, Bld 73  70 - 99 mg/dL   BUN 6  6 - 23 mg/dL   Creatinine, Ser 1.61 (*) 0.50 - 1.10 mg/dL   Calcium 8.5  8.4 - 09.6 mg/dL   Total Protein 6.0  6.0  - 8.3 g/dL   Albumin 2.1 (*) 3.5 - 5.2 g/dL   AST 045 (*) 0 - 37 U/L   ALT 1075 (*) 0 - 35 U/L   Alkaline Phosphatase 177 (*) 39 - 117 U/L   Total Bilirubin 1.0  0.3 - 1.2 mg/dL   GFR calc non Af Amer 54 (*) >90 mL/min   GFR calc Af Amer 63 (*) >90 mL/min  CBC     Status: Abnormal   Collection Time   08/08/12  3:35 AM      Component Value Range   WBC 15.1 (*) 4.0 - 10.5 K/uL   RBC 2.99 (*) 3.87 - 5.11 MIL/uL   Hemoglobin 9.0 (*) 12.0 - 15.0 g/dL   HCT 40.9 (*) 81.1 - 91.4 %   MCV 90.0  78.0 - 100.0 fL   MCH 30.1  26.0 - 34.0 pg   MCHC 33.5  30.0 - 36.0 g/dL   RDW 78.2  95.6 - 21.3 %   Platelets 349  150 - 400 K/uL  GLUCOSE, CAPILLARY     Status: Abnormal   Collection Time   08/08/12  6:36 AM      Component Value Range   Glucose-Capillary 67 (*) 70 - 99 mg/dL  GLUCOSE, CAPILLARY     Status: Normal   Collection Time   08/08/12  7:36 AM      Component Value Range   Glucose-Capillary 93  70 - 99 mg/dL   Comment 1 Documented in Chart     Comment 2 Notify RN      Micro Results: Recent Results (from the past 240 hour(s))  WOUND CULTURE     Status: Normal   Collection Time   08/01/12  7:19 AM      Component Value Range Status Comment   Specimen Description FOOT   Final    Special Requests NONE   Final    Gram Stain     Final    Value: FEW WBC PRESENT, PREDOMINANTLY PMN     NO SQUAMOUS EPITHELIAL CELLS SEEN  FEW GRAM NEGATIVE RODS     FEW GRAM POSITIVE COCCI IN PAIRS   Culture     Final    Value: FEW GROUP B STREP(S.AGALACTIAE)ISOLATED     Note: TESTING AGAINST S. AGALACTIAE NOT ROUTINELY PERFORMED DUE TO PREDICTABILITY OF AMP/PEN/VAN SUSCEPTIBILITY.   Report Status 08/03/2012 FINAL   Final   CULTURE, BLOOD (ROUTINE X 2)     Status: Normal (Preliminary result)   Collection Time   08/02/12  4:55 PM      Component Value Range Status Comment   Specimen Description BLOOD RIGHT HAND   Final    Special Requests BOTTLES DRAWN AEROBIC AND ANAEROBIC 10CC   Final    Culture   Setup Time 08/03/2012 02:40   Final    Culture     Final    Value:        BLOOD CULTURE RECEIVED NO GROWTH TO DATE CULTURE WILL BE HELD FOR 5 DAYS BEFORE ISSUING A FINAL NEGATIVE REPORT   Report Status PENDING   Incomplete   CULTURE, BLOOD (ROUTINE X 2)     Status: Normal (Preliminary result)   Collection Time   08/02/12  5:02 PM      Component Value Range Status Comment   Specimen Description BLOOD RIGHT ARM   Final    Special Requests BOTTLES DRAWN AEROBIC AND ANAEROBIC 10CC   Final    Culture  Setup Time 08/03/2012 02:40   Final    Culture     Final    Value:        BLOOD CULTURE RECEIVED NO GROWTH TO DATE CULTURE WILL BE HELD FOR 5 DAYS BEFORE ISSUING A FINAL NEGATIVE REPORT   Report Status PENDING   Incomplete   CULTURE, BLOOD (ROUTINE X 2)     Status: Normal (Preliminary result)   Collection Time   08/06/12 12:11 PM      Component Value Range Status Comment   Specimen Description BLOOD RIGHT ARM   Final    Special Requests BOTTLES DRAWN AEROBIC AND ANAEROBIC 5CC   Final    Culture  Setup Time 08/06/2012 15:56   Final    Culture     Final    Value:        BLOOD CULTURE RECEIVED NO GROWTH TO DATE CULTURE WILL BE HELD FOR 5 DAYS BEFORE ISSUING A FINAL NEGATIVE REPORT   Report Status PENDING   Incomplete   CULTURE, BLOOD (ROUTINE X 2)     Status: Normal (Preliminary result)   Collection Time   08/06/12 12:17 PM      Component Value Range Status Comment   Specimen Description BLOOD RIGHT HAND   Final    Special Requests BOTTLES DRAWN AEROBIC ONLY 4CC   Final    Culture  Setup Time 08/06/2012 15:56   Final    Culture     Final    Value:        BLOOD CULTURE RECEIVED NO GROWTH TO DATE CULTURE WILL BE HELD FOR 5 DAYS BEFORE ISSUING A FINAL NEGATIVE REPORT   Report Status PENDING   Incomplete     Medications:  Scheduled Meds:   . heparin  5,000 Units Subcutaneous Q8H  . insulin aspart  0-15 Units Subcutaneous TID WC  . insulin glargine  12 Units Subcutaneous Daily  .  piperacillin-tazobactam (ZOSYN)  IV  3.375 g Intravenous Q8H  . potassium chloride  40 mEq Oral BID  . vancomycin  1,000 mg Intravenous Q24H  . DISCONTD: insulin  glargine  15 Units Subcutaneous BID  . DISCONTD: insulin glargine  15 Units Subcutaneous Daily  . DISCONTD: vancomycin  1,000 mg Intravenous Q12H   Continuous Infusions:   . sodium chloride 1,000 mL (08/07/12 2240)   PRN Meds:.ondansetron, oxyCODONE-acetaminophen, promethazine, zolpidem, DISCONTD: acetaminophen, DISCONTD: oxyCODONE-acetaminophen  Assessment/Plan: Hypotension  -Possibly secondary to sepsis, fluid boluses, continue IV vancomycin and Zosyn. Negative procalcitonin and lactic acid levels, repeated blood cultures for sepsis workup on 9/24 negative to date  -Hemoglobin stable, cardiac enzymes negative.  -Improving with fluid resuscitation  -concerning for possible liver shock from acute hypotension on 9/24   Altered mental status/decreased responsiveness  -Secondary to above/infection -Improving with fluid resuscitation.   Diabetic foot ulcer/ Cellulitis  -Status post I&D per orthopedics last pm patient with no further fevers  -continue vancomycin & Zosyn  -Leukocytosis beginning to trend down-  -MRI with no evidence of osteomyelitis  -Surgery plans to repack wound today  Elevated Liver Enzymes and Nausea and Vomiting -pt is not eating well because of nausea -continue IV nausea meds -acute hepatitis panel ordered -liver US ordered -ask for GI consultation   Hypertension -monitoring BPs closely  DM2 (diabetes mellitus, type 2) with hypoglycemic episodes  -BG trending down and some hypoglycemia, decrease Lantus  -Infection and #1 and poor p.o. Intake contributing to hypoglycemia, follow.  -add dextrose to IVFs since she is not eating  Normocytic Anemia, multifactorial  - hemoccult of stool negative  - following    Hypokalemia  -repleted orally, following   Code Status: Full code  Family  Communication:  Disposition Plan: To home when medically stable   LOS: 8 days   Lankford Gutzmer 08/08/2012, 10:45 AM  Cleora Fleet, MD, CDE, FAAFP [facility] [place], [place]  [phone]

## 2012-08-09 DIAGNOSIS — K7200 Acute and subacute hepatic failure without coma: Secondary | ICD-10-CM | POA: Diagnosis not present

## 2012-08-09 LAB — BASIC METABOLIC PANEL
Chloride: 101 mEq/L (ref 96–112)
Creatinine, Ser: 1.15 mg/dL — ABNORMAL HIGH (ref 0.50–1.10)
GFR calc Af Amer: 64 mL/min — ABNORMAL LOW (ref >90)
GFR calc non Af Amer: 55 mL/min — ABNORMAL LOW (ref >90)
Potassium: 3.7 mEq/L (ref 3.5–5.1)

## 2012-08-09 LAB — CULTURE, BLOOD (ROUTINE X 2): Culture: NO GROWTH

## 2012-08-09 LAB — HEPATITIS PANEL, ACUTE
HCV Ab: NEGATIVE
Hep A IgM: NEGATIVE
Hep B C IgM: NEGATIVE

## 2012-08-09 LAB — DIFFERENTIAL
Basophils Absolute: 0.1 10*3/uL (ref 0.0–0.1)
Eosinophils Relative: 3 % (ref 0–5)
Lymphocytes Relative: 16 % (ref 12–46)
Monocytes Relative: 7 % (ref 3–12)
Neutrophils Relative %: 73 % (ref 43–77)

## 2012-08-09 LAB — CBC
Platelets: 377 10*3/uL (ref 150–400)
RBC: 3.06 MIL/uL — ABNORMAL LOW (ref 3.87–5.11)
WBC: 14.2 10*3/uL — ABNORMAL HIGH (ref 4.0–10.5)

## 2012-08-09 LAB — GLUCOSE, CAPILLARY
Glucose-Capillary: 205 mg/dL — ABNORMAL HIGH (ref 70–99)
Glucose-Capillary: 175 mg/dL — ABNORMAL HIGH (ref 70–99)

## 2012-08-09 LAB — HEPATIC FUNCTION PANEL
Albumin: 2.3 g/dL — ABNORMAL LOW (ref 3.5–5.2)
Bilirubin, Direct: 0.2 mg/dL (ref 0.0–0.3)
Indirect Bilirubin: 0.4 mg/dL (ref 0.3–0.9)
Total Bilirubin: 0.6 mg/dL (ref 0.3–1.2)

## 2012-08-09 MED ORDER — PANTOPRAZOLE SODIUM 40 MG PO TBEC
40.0000 mg | DELAYED_RELEASE_TABLET | Freq: Every day | ORAL | Status: DC
  Administered 2012-08-09 – 2012-08-19 (×7): 40 mg via ORAL
  Filled 2012-08-09 (×15): qty 1

## 2012-08-09 MED ORDER — METOCLOPRAMIDE HCL 5 MG/ML IJ SOLN
10.0000 mg | Freq: Three times a day (TID) | INTRAMUSCULAR | Status: AC
  Administered 2012-08-09 – 2012-08-12 (×9): 10 mg via INTRAVENOUS
  Filled 2012-08-09 (×9): qty 2

## 2012-08-09 MED ORDER — AMLODIPINE BESYLATE 5 MG PO TABS
5.0000 mg | ORAL_TABLET | Freq: Every day | ORAL | Status: DC
  Administered 2012-08-09 – 2012-08-10 (×2): 5 mg via ORAL
  Filled 2012-08-09 (×2): qty 1

## 2012-08-09 MED ORDER — ADULT MULTIVITAMIN W/MINERALS CH
1.0000 | ORAL_TABLET | Freq: Every day | ORAL | Status: DC
  Administered 2012-08-10 – 2012-08-28 (×6): 1 via ORAL
  Filled 2012-08-09 (×22): qty 1

## 2012-08-09 NOTE — Progress Notes (Signed)
Nutrition Follow-up  Intervention: Anti-emetics per MD, may need something stronger as nausea persists. Hopefully as nausea resolves, intake will improve. Multivitamin 1 tablet PO daily. Nursing to continue to encourage increased meal intake. Will monitor.   Diet Order: Clear liquid, 0-50% meal intake  - Pt had I&D of diabetic foot ulcer 9/23. Noted pt possibly with shock liver per MD notes. Noted AST/ALT trending down. Pt with poor intake r/t worsening nausea. Pt did not eat breakfast this morning, slept through it per family member. Noted pt with hypoglycemia earlier in the week from inadequate oral intake - noted dextrose added to IVF.   Meds: Scheduled Meds:   . heparin  5,000 Units Subcutaneous Q8H  . insulin aspart  0-15 Units Subcutaneous TID WC  . insulin glargine  12 Units Subcutaneous Daily  . piperacillin-tazobactam (ZOSYN)  IV  3.375 g Intravenous Q8H  . potassium chloride  40 mEq Oral BID  . vancomycin  1,000 mg Intravenous Q24H   Continuous Infusions:   . dextrose 5 % and 0.9% NaCl 75 mL/hr at 08/08/12 1158   PRN Meds:.ondansetron, oxyCODONE-acetaminophen, promethazine, zolpidem  Labs:  CMP     Component Value Date/Time   NA 139 08/09/2012 0345   K 3.7 08/09/2012 0345   CL 101 08/09/2012 0345   CO2 29 08/09/2012 0345   GLUCOSE 165* 08/09/2012 0345   BUN 5* 08/09/2012 0345   CREATININE 1.15* 08/09/2012 0345   CALCIUM 8.6 08/09/2012 0345   PROT 6.3 08/09/2012 0345   ALBUMIN 2.3* 08/09/2012 0345   AST 169* 08/09/2012 0345   ALT 676* 08/09/2012 0345   ALKPHOS 164* 08/09/2012 0345   BILITOT 0.6 08/09/2012 0345   GFRNONAA 55* 08/09/2012 0345   GFRAA 64* 08/09/2012 0345   CBG (last 3)   Basename 08/09/12 0800 08/09/12 0538 08/09/12 0235  GLUCAP 168* 176* 138*      Intake/Output Summary (Last 24 hours) at 08/09/12 1219 Last data filed at 08/08/12 1722  Gross per 24 hour  Intake  392.5 ml  Output      0 ml  Net  392.5 ml   Last BM - 9/27  Weight Status: No new weights    Estimated needs:   1750-1900 calories 70-85g protein  Nutrition Dx: Inadequate oral intake - ongoing  Goal:  1. Resolution of nausea/diarrhea - not met with nausea, no diarrhea reported.  2. Pt to consume >90% of meals - not met.   Monitor:  Weight, intake, labs, CBGs, nausea, AST/ALT   Levon Hedger MS, RD, LDN [phone] Pager [phone] After Hours Pager

## 2012-08-09 NOTE — Progress Notes (Signed)
[facility] Progress Note  08/09/2012  HPI  Jennifer C Laitinen is a 51 y.o. female with PMH of DM2 diagnosed 13 years ago (does not know her A1C), who tried to cut what she thought was a callus off of her foot 2 weeks ago. She thought nothing of it until 2 days ago when her foot and ankle began to swell up. Finally today she noticed drainage through her sock and a very foul smell when she took her sock off. This prompted the patient to present to the ED for treatment. Evaluation in the ED demonstrated a non painful diabetic foot ulcer with purulent drainage and obvious edema of leg and erythema around ulcer. Lab work up showed leukocytosis, X-ray of foot didn't show any obvious osteomylitis, patient started on zosyn hospitalist has been asked to admit for IV antibiotics.  Subjective: Pt still having significant nausea and not able to eat well.    Objective:  Vital signs in last 24 hours: Filed Vitals:   08/08/12 0647 08/08/12 1325 08/08/12 2115 08/09/12 0530  BP: 160/90 170/93 164/81 170/86  Pulse: 92 88 89 73  Temp: 98.8 F (37.1 C) 99.1 F (37.3 C) 99 F (37.2 C) 98.5 F (36.9 C)  TempSrc: Oral Oral Oral Oral  Resp: 17 18 16 17   Height:      Weight:      SpO2: 96% 98% 95% 98%   Weight change:   Intake/Output Summary (Last 24 hours) at 08/09/12 1342 Last data filed at 08/08/12 1722  Gross per 24 hour  Intake  392.5 ml  Output      0 ml  Net  392.5 ml   Lab Results  Component Value Date   HGBA1C 6.7* 07/31/2012   Lab Results  Component Value Date   CREATININE 1.15* 08/09/2012    Review of Systems As above, otherwise all reviewed and reported negative  Physical Exam General - awake, no distress, cooperative  HEENT - NCAT, MMM Lungs - BBS, CTA  CV - normal s1, s2 sounds  Abd - soft, nondistended, no masses, nontender  Ext - Right foot dressings clean and dry  Lab Results: Results for orders placed during the hospital encounter of 07/31/12 (from the past 24  hour(s))  GLUCOSE, CAPILLARY     Status: Abnormal   Collection Time   08/08/12  5:23 PM      Component Value Range   Glucose-Capillary 109 (*) 70 - 99 mg/dL   Comment 1 Documented in Chart     Comment 2 Notify RN    GLUCOSE, CAPILLARY     Status: Abnormal   Collection Time   08/08/12  9:21 PM      Component Value Range   Glucose-Capillary 112 (*) 70 - 99 mg/dL   Comment 1 Notify RN    GLUCOSE, CAPILLARY     Status: Abnormal   Collection Time   08/09/12  2:35 AM      Component Value Range   Glucose-Capillary 138 (*) 70 - 99 mg/dL   Comment 1 Notify RN    HEPATIC FUNCTION PANEL     Status: Abnormal   Collection Time   08/09/12  3:45 AM      Component Value Range   Total Protein 6.3  6.0 - 8.3 g/dL   Albumin 2.3 (*) 3.5 - 5.2 g/dL   AST 161 (*) 0 - 37 U/L   ALT 676 (*) 0 - 35 U/L   Alkaline Phosphatase 164 (*) 39 - 117  U/L   Total Bilirubin 0.6  0.3 - 1.2 mg/dL   Bilirubin, Direct 0.2  0.0 - 0.3 mg/dL   Indirect Bilirubin 0.4  0.3 - 0.9 mg/dL  CBC     Status: Abnormal   Collection Time   08/09/12  3:45 AM      Component Value Range   WBC 14.2 (*) 4.0 - 10.5 K/uL   RBC 3.06 (*) 3.87 - 5.11 MIL/uL   Hemoglobin 9.1 (*) 12.0 - 15.0 g/dL   HCT 16.1 (*) 09.6 - 04.5 %   MCV 90.8  78.0 - 100.0 fL   MCH 29.7  26.0 - 34.0 pg   MCHC 32.7  30.0 - 36.0 g/dL   RDW 40.9  81.1 - 91.4 %   Platelets 377  150 - 400 K/uL  DIFFERENTIAL     Status: Abnormal   Collection Time   08/09/12  3:45 AM      Component Value Range   Neutrophils Relative 73  43 - 77 %   Lymphocytes Relative 16  12 - 46 %   Monocytes Relative 7  3 - 12 %   Eosinophils Relative 3  0 - 5 %   Basophils Relative 1  0 - 1 %   Neutro Abs 10.4 (*) 1.7 - 7.7 K/uL   Lymphs Abs 2.3  0.7 - 4.0 K/uL   Monocytes Absolute 1.0  0.1 - 1.0 K/uL   Eosinophils Absolute 0.4  0.0 - 0.7 K/uL   Basophils Absolute 0.1  0.0 - 0.1 K/uL   RBC Morphology POLYCHROMASIA PRESENT     WBC Morphology MILD LEFT SHIFT (1-5% METAS, OCC MYELO, OCC  BANDS)    BASIC METABOLIC PANEL     Status: Abnormal   Collection Time   08/09/12  3:45 AM      Component Value Range   Sodium 139  135 - 145 mEq/L   Potassium 3.7  3.5 - 5.1 mEq/L   Chloride 101  96 - 112 mEq/L   CO2 29  19 - 32 mEq/L   Glucose, Bld 165 (*) 70 - 99 mg/dL   BUN 5 (*) 6 - 23 mg/dL   Creatinine, Ser 7.82 (*) 0.50 - 1.10 mg/dL   Calcium 8.6  8.4 - 95.6 mg/dL   GFR calc non Af Amer 55 (*) >90 mL/min   GFR calc Af Amer 64 (*) >90 mL/min  GLUCOSE, CAPILLARY     Status: Abnormal   Collection Time   08/09/12  5:38 AM      Component Value Range   Glucose-Capillary 176 (*) 70 - 99 mg/dL   Comment 1 Notify RN    GLUCOSE, CAPILLARY     Status: Abnormal   Collection Time   08/09/12  8:00 AM      Component Value Range   Glucose-Capillary 168 (*) 70 - 99 mg/dL   Comment 1 Notify RN      Micro Results: Recent Results (from the past 240 hour(s))  WOUND CULTURE     Status: Normal   Collection Time   08/01/12  7:19 AM      Component Value Range Status Comment   Specimen Description FOOT   Final    Special Requests NONE   Final    Gram Stain     Final    Value: FEW WBC PRESENT, PREDOMINANTLY PMN     NO SQUAMOUS EPITHELIAL CELLS SEEN     FEW GRAM NEGATIVE RODS     FEW GRAM POSITIVE  COCCI IN PAIRS   Culture     Final    Value: FEW GROUP B STREP(S.AGALACTIAE)ISOLATED     Note: TESTING AGAINST S. AGALACTIAE NOT ROUTINELY PERFORMED DUE TO PREDICTABILITY OF AMP/PEN/VAN SUSCEPTIBILITY.   Report Status 08/03/2012 FINAL   Final   CULTURE, BLOOD (ROUTINE X 2)     Status: Normal   Collection Time   08/02/12  4:55 PM      Component Value Range Status Comment   Specimen Description BLOOD RIGHT HAND   Final    Special Requests BOTTLES DRAWN AEROBIC AND ANAEROBIC 10CC   Final    Culture  Setup Time 08/03/2012 02:40   Final    Culture NO GROWTH 5 DAYS   Final    Report Status 08/09/2012 FINAL   Final   CULTURE, BLOOD (ROUTINE X 2)     Status: Normal   Collection Time   08/02/12  5:02  PM      Component Value Range Status Comment   Specimen Description BLOOD RIGHT ARM   Final    Special Requests BOTTLES DRAWN AEROBIC AND ANAEROBIC 10CC   Final    Culture  Setup Time 08/03/2012 02:40   Final    Culture NO GROWTH 5 DAYS   Final    Report Status 08/09/2012 FINAL   Final   CULTURE, BLOOD (ROUTINE X 2)     Status: Normal (Preliminary result)   Collection Time   08/06/12 12:11 PM      Component Value Range Status Comment   Specimen Description BLOOD RIGHT ARM   Final    Special Requests BOTTLES DRAWN AEROBIC AND ANAEROBIC 5CC   Final    Culture  Setup Time 08/06/2012 15:56   Final    Culture     Final    Value:        BLOOD CULTURE RECEIVED NO GROWTH TO DATE CULTURE WILL BE HELD FOR 5 DAYS BEFORE ISSUING A FINAL NEGATIVE REPORT   Report Status PENDING   Incomplete   CULTURE, BLOOD (ROUTINE X 2)     Status: Normal (Preliminary result)   Collection Time   08/06/12 12:17 PM      Component Value Range Status Comment   Specimen Description BLOOD RIGHT HAND   Final    Special Requests BOTTLES DRAWN AEROBIC ONLY 4CC   Final    Culture  Setup Time 08/06/2012 15:56   Final    Culture     Final    Value:        BLOOD CULTURE RECEIVED NO GROWTH TO DATE CULTURE WILL BE HELD FOR 5 DAYS BEFORE ISSUING A FINAL NEGATIVE REPORT   Report Status PENDING   Incomplete     Medications:  Scheduled Meds:   . amLODipine  5 mg Oral Daily  . heparin  5,000 Units Subcutaneous Q8H  . insulin aspart  0-15 Units Subcutaneous TID WC  . insulin glargine  12 Units Subcutaneous Daily  . multivitamin with minerals  1 tablet Oral Daily  . pantoprazole  40 mg Oral Q0600  . piperacillin-tazobactam (ZOSYN)  IV  3.375 g Intravenous Q8H  . potassium chloride  40 mEq Oral BID  . DISCONTD: vancomycin  1,000 mg Intravenous Q24H   Continuous Infusions:   . dextrose 5 % and 0.9% NaCl 75 mL/hr at 08/08/12 1158   PRN Meds:.ondansetron, oxyCODONE-acetaminophen, promethazine,  zolpidem  Assessment/Plan:  Hypotension  -resolved now -Possibly secondary to sepsis, fluid boluses, continue IV vancomycin and Zosyn. Negative procalcitonin and  lactic acid levels, repeated blood cultures for sepsis workup on 9/24 negative to date  -Hemoglobin stable, cardiac enzymes negative.  -resolved with fluid resuscitation  -concerning for possible liver shock from acute hypotension on 9/24   Altered mental status/decreased responsiveness  -Secondary to above and infection  -resolved now with fluid resuscitation   Nausea and Vomiting - continue zofran and phenergan IV - clear liquid diet - ambulate as tolerated - add protonix - ? Gastroparesis - trial of metoclopramide  Diabetic foot ulcer/ Cellulitis  -Status post I&D per orthopedics last pm patient with no further fevers  -continue Zosyn until can take oral meds -Leukocytosis beginning to trend down  -MRI with no evidence of osteomyelitis  -Surgery repacked wound  - dc vanc as no MRSA grew on wound culture  Elevated Liver Enzymes and Nausea and Vomiting  -pt is not eating well because of nausea  -continue IV nausea meds  -acute hepatitis panel ordered  -liver US ordered  -suspect shock liver, now enzymes improving    Hypertension  -monitoring BPs closely, adding amlodipine 5 mg daily  DM2 (diabetes mellitus, type 2) with hypoglycemic episodes  -BG trending down and some hypoglycemia, decrease Lantus  -Infection and #1 and poor p.o. Intake contributing to hypoglycemia, follow.  -add dextrose to IVFs since she is not eating   Normocytic Anemia, multifactorial  - hemoccult of stool negative  - following   Hypokalemia  -repleted orally, following   LOS: 9 days   Alaine Loughney 08/09/2012, 1:42 PM  Cleora Fleet, MD, CDE, FAAFP [facility] [place], [place]  [phone]

## 2012-08-09 NOTE — Progress Notes (Signed)
Pt's blood pressure was 164/81 at 21:00 9/26. Notified [facility] midlevel K. Wellsite geologist. Per Schorr, NP it's okay and to notify if it becomes unusually high.

## 2012-08-09 NOTE — Progress Notes (Signed)
Pt. Sleeping during the 4 hour shift I was here. At 1900 pt. Stated she just got her dinner. Insulin not given yet, told her to put her light on when she was done eating so she could receive her insulin. CBG was 175 @ 1700. Night nurse was informed of her insulin that is due.

## 2012-08-10 ENCOUNTER — Inpatient Hospital Stay (HOSPITAL_COMMUNITY): Payer: BC Managed Care – PPO

## 2012-08-10 DIAGNOSIS — K7200 Acute and subacute hepatic failure without coma: Secondary | ICD-10-CM

## 2012-08-10 DIAGNOSIS — E1143 Type 2 diabetes mellitus with diabetic autonomic (poly)neuropathy: Secondary | ICD-10-CM | POA: Diagnosis not present

## 2012-08-10 LAB — GLUCOSE, CAPILLARY
Glucose-Capillary: 213 mg/dL — ABNORMAL HIGH (ref 70–99)
Glucose-Capillary: 220 mg/dL — ABNORMAL HIGH (ref 70–99)
Glucose-Capillary: 185 mg/dL — ABNORMAL HIGH (ref 70–99)

## 2012-08-10 LAB — COMPREHENSIVE METABOLIC PANEL
ALT: 426 U/L — ABNORMAL HIGH (ref 0–35)
AST: 46 U/L — ABNORMAL HIGH (ref 0–37)
CO2: 29 mEq/L (ref 19–32)
Chloride: 100 mEq/L (ref 96–112)
GFR calc non Af Amer: 64 mL/min — ABNORMAL LOW (ref >90)
Potassium: 3.3 mEq/L — ABNORMAL LOW (ref 3.5–5.1)
Sodium: 136 mEq/L (ref 135–145)
Total Bilirubin: 0.4 mg/dL (ref 0.3–1.2)

## 2012-08-10 LAB — CBC
MCH: 29.9 pg (ref 26.0–34.0)
MCHC: 33 g/dL (ref 30.0–36.0)
Platelets: 387 10*3/uL (ref 150–400)

## 2012-08-10 MED ORDER — AMLODIPINE BESYLATE 10 MG PO TABS
10.0000 mg | ORAL_TABLET | Freq: Every day | ORAL | Status: DC
  Administered 2012-08-13 – 2012-08-16 (×3): 10 mg via ORAL
  Filled 2012-08-10 (×7): qty 1

## 2012-08-10 MED ORDER — POTASSIUM CHLORIDE 10 MEQ/100ML IV SOLN
10.0000 meq | INTRAVENOUS | Status: AC
  Administered 2012-08-10 (×4): 10 meq via INTRAVENOUS
  Filled 2012-08-10 (×4): qty 100

## 2012-08-10 MED ORDER — HYDRALAZINE HCL 20 MG/ML IJ SOLN
10.0000 mg | Freq: Four times a day (QID) | INTRAMUSCULAR | Status: DC | PRN
  Administered 2012-08-12 (×2): 10 mg via INTRAVENOUS
  Filled 2012-08-10 (×2): qty 1

## 2012-08-10 NOTE — Progress Notes (Signed)
CBG was just taken and is 213. Notified midlevel on call for [facility], M. Lynch. No new orders at this time.

## 2012-08-10 NOTE — Consult Note (Signed)
Referring Provider: Dr. Maryln [name] Primary Care Physician:  No primary provider on file. Primary Gastroenterologist:  Jennifer [name]  Reason for Consultation:  Nausea and vomiting  HPI: Jennifer [name] is a 51 y.o. female being seen as a consult due to intractable nausea and vomiting for the past 2 days. States she cannot stand the smell of food. Reports very good appetite at home and states she was able to eat very well without any problems. She has known diabetes but denies any problems eating with her diabetes. No known history of gastroparesis. Reports rare episodes of diarrhea at times as an outpt. Last BM was this past Monday. Patient on IV Abx for foot infection but osteomyelitis ruled out. Evidence of elevated LFTs that is improving and thought to be due to shock liver. Cultures negative to date. Daughter at bedside. Patient not tolerating clears either. Patient took BCs BID for 2 weeks prior to admit.  Past Medical History  Diagnosis Date  . Diabetes mellitus     History reviewed. No pertinent past surgical history.  Prior to Admission medications   Medication Sig Start Date End Date Taking? Authorizing Provider  insulin glargine (LANTUS) 100 UNIT/ML injection Inject 25 Units into the skin 2 (two) times daily.    Yes Historical Provider, MD  ciprofloxacin (CIPRO) 500 MG tablet Take 500 mg by mouth 2 (two) times daily.    Historical Provider, MD    Scheduled Meds:   . amLODipine  10 mg Oral Daily  . heparin  5,000 Units Subcutaneous Q8H  . insulin aspart  0-15 Units Subcutaneous TID WC  . insulin glargine  12 Units Subcutaneous Daily  . metoCLOPramide (REGLAN) injection  10 mg Intravenous Q8H  . multivitamin with minerals  1 tablet Oral Daily  . pantoprazole  40 mg Oral Daily  . potassium chloride  10 mEq Intravenous Q1 Hr x 4  . DISCONTD: amLODipine  5 mg Oral Daily  . DISCONTD: piperacillin-tazobactam (ZOSYN)  IV  3.375 g Intravenous Q8H   Continuous Infusions:   . dextrose  5 % and 0.9% NaCl 75 mL/hr at 08/10/12 0532   PRN Meds:.hydrALAZINE, ondansetron, oxyCODONE-acetaminophen, promethazine, zolpidem  Allergies as of 07/31/2012  . (No Known Allergies)    Family History  Problem Relation Age of Onset  . Diabetes Mother     History   Social History  . Marital Status: Divorced    Spouse Name: N/A    Number of Children: N/A  . Years of Education: N/A   Occupational History  . Not on file.   Social History Main Topics  . Smoking status: Never Smoker   . Smokeless tobacco: Not on file  . Alcohol Use: No  . Drug Use: No  . Sexually Active:    Other Topics Concern  . Not on file   Social History Narrative   ** Merged History Encounter **     Review of Systems: All negative except as stated above in HPI.  Physical Exam: Vital signs: Filed Vitals:   08/10/12 1438  BP: 166/91  Pulse: 95  Temp: 99 F (37.2 C)  Resp: 16   Last BM Date: 08/07/12 General:   Alert,  Well-developed, well-nourished, pleasant and cooperative in NAD Lungs:  Clear throughout to auscultation.   No wheezes, crackles, or rhonchi. No acute distress. Heart:  Regular rate and rhythm; no murmurs, clicks, rubs,  or gallops. Abdomen: soft, NT, ND, +BS  Rectal:  Deferred  GI:  Lab Results:  [facility] 08/10/12  1224 08/09/12 0345 08/08/12 0335  WBC 13.0* 14.2* 15.1*  HGB 9.1* 9.1* 9.0*  HCT 27.6* 27.8* 26.9*  PLT 387 377 349   BMET  Basename 08/10/12 1224 08/09/12 0345 08/08/12 0335  NA 136 139 137  K 3.3* 3.7 4.1  CL 100 101 105  CO2 29 29 25   GLUCOSE 202* 165* 73  BUN 5* 5* 6  CREATININE 1.01 1.15* 1.16*  CALCIUM 8.6 8.6 8.5   LFT  Basename 08/10/12 1224 08/09/12 0345  PROT 6.4 --  ALBUMIN 2.3* --  AST 46* --  ALT 426* --  ALKPHOS 142* --  BILITOT 0.4 --  BILIDIR -- 0.2  IBILI -- 0.4   PT/INR No results found for this basename: LABPROT:2,INR:2 in the last 72 hours   Studies/Results: No results found.  Impression/Plan: 51yo with  persistent N/V of unclear etiology. Recent infection and hypotension could have caused an adynamic ileus and pt has not had a BM since Monday, which could be contributing to her symptoms. Doubt diabetic gastroparesis since she states that the N/V is new and she does not normally have problems eating at home. A peptic ulcer is possible with her NSAID use. Will check abd Xray today and plan to do EGD tomorrow if no ileus seen on Xray. NPO after midnight. Was on Zosyn for her diabetic foot ulcer and Zosyn has been stopped. Meds could be the culprit of her N/V. Continue supportive care.    LOS: 10 days   Jennifer [name] C.  08/10/2012, 3:33 PM

## 2012-08-10 NOTE — Progress Notes (Signed)
[facility] Progress Note  08/10/2012  HPI  Jennifer [name] is a 51 y.o. female with PMH of DM2 diagnosed 13 years ago (does not know her A1C), who tried to cut what she thought was a callus off of her foot 2 weeks ago. She thought nothing of it until 2 days ago when her foot and ankle began to swell up. Finally today she noticed drainage through her sock and a very foul smell when she took her sock off. This prompted the patient to present to the ED for treatment. Evaluation in the ED demonstrated a non painful diabetic foot ulcer with purulent drainage and obvious edema of leg and erythema around ulcer. Lab work up showed leukocytosis, X-ray of foot didn't show any obvious osteomylitis, patient started on zosyn hospitalist has been asked to admit for IV antibiotics.  Subjective: Pt still having significant nausea and vomiting.  She is not able to tolerate clears diet.  She cannot stand to smell food.    Objective:  Vital signs in last 24 hours: Filed Vitals:   08/09/12 1629 08/09/12 2125 08/10/12 0450 08/10/12 0931  BP: 176/98 174/102 158/80 171/80  Pulse:  100 90 90  Temp:  98.5 F (36.9 C) 98.1 F (36.7 C)   TempSrc:  Oral Oral   Resp:  17 16   Height:      Weight:      SpO2:  98% 98%    Weight change:   Intake/Output Summary (Last 24 hours) at 08/10/12 1158 Last data filed at 08/09/12 1429  Gross per 24 hour  Intake    852 ml  Output      0 ml  Net    852 ml   Lab Results  Component Value Date   HGBA1C 6.7* 07/31/2012   Lab Results  Component Value Date   CREATININE 1.15* 08/09/2012    Review of Systems As above, otherwise all reviewed and reported negative  Physical Exam General - awake, no distress, cooperative  HEENT - NCAT, MMM Lungs - BBS, CTA  CV - normal s1, s2 sounds  Abd - soft, nondistended, no masses, nontender  Ext - Right foot dressings clean and dry  Lab Results: Results for orders placed during the hospital encounter of 07/31/12 (from the  past 24 hour(s))  GLUCOSE, CAPILLARY     Status: Abnormal   Collection Time   08/09/12  2:40 PM      Component Value Range   Glucose-Capillary 205 (*) 70 - 99 mg/dL   Comment 1 Notify RN    GLUCOSE, CAPILLARY     Status: Abnormal   Collection Time   08/09/12  5:34 PM      Component Value Range   Glucose-Capillary 175 (*) 70 - 99 mg/dL   Comment 1 Notify RN    GLUCOSE, CAPILLARY     Status: Abnormal   Collection Time   08/09/12  9:29 PM      Component Value Range   Glucose-Capillary 176 (*) 70 - 99 mg/dL   Comment 1 Notify RN    GLUCOSE, CAPILLARY     Status: Abnormal   Collection Time   08/10/12  2:42 AM      Component Value Range   Glucose-Capillary 213 (*) 70 - 99 mg/dL   Comment 1 Notify RN    GLUCOSE, CAPILLARY     Status: Abnormal   Collection Time   08/10/12  4:54 AM      Component Value Range     Glucose-Capillary 206 (*) 70 - 99 mg/dL   Comment 1 Notify RN    GLUCOSE, CAPILLARY     Status: Abnormal   Collection Time   08/10/12  8:09 AM      Component Value Range   Glucose-Capillary 220 (*) 70 - 99 mg/dL    Micro Results: Recent Results (from the past 240 hour(s))  WOUND CULTURE     Status: Normal   Collection Time   08/01/12  7:19 AM      Component Value Range Status Comment   Specimen Description FOOT   Final    Special Requests NONE   Final    Gram Stain     Final    Value: FEW WBC PRESENT, PREDOMINANTLY PMN     NO SQUAMOUS EPITHELIAL CELLS SEEN     FEW GRAM NEGATIVE RODS     FEW GRAM POSITIVE COCCI IN PAIRS   Culture     Final    Value: FEW GROUP B STREP(S.AGALACTIAE)ISOLATED     Note: TESTING AGAINST S. AGALACTIAE NOT ROUTINELY PERFORMED DUE TO PREDICTABILITY OF AMP/PEN/VAN SUSCEPTIBILITY.   Report Status 08/03/2012 FINAL   Final   CULTURE, BLOOD (ROUTINE X 2)     Status: Normal   Collection Time   08/02/12  4:55 PM      Component Value Range Status Comment   Specimen Description BLOOD RIGHT HAND   Final    Special Requests BOTTLES DRAWN AEROBIC AND  ANAEROBIC 10CC   Final    Culture  Setup Time 08/03/2012 02:40   Final    Culture NO GROWTH 5 DAYS   Final    Report Status 08/09/2012 FINAL   Final   CULTURE, BLOOD (ROUTINE X 2)     Status: Normal   Collection Time   08/02/12  5:02 PM      Component Value Range Status Comment   Specimen Description BLOOD RIGHT ARM   Final    Special Requests BOTTLES DRAWN AEROBIC AND ANAEROBIC 10CC   Final    Culture  Setup Time 08/03/2012 02:40   Final    Culture NO GROWTH 5 DAYS   Final    Report Status 08/09/2012 FINAL   Final   CULTURE, BLOOD (ROUTINE X 2)     Status: Normal (Preliminary result)   Collection Time   08/06/12 12:11 PM      Component Value Range Status Comment   Specimen Description BLOOD RIGHT ARM   Final    Special Requests BOTTLES DRAWN AEROBIC AND ANAEROBIC 5CC   Final    Culture  Setup Time 08/06/2012 15:56   Final    Culture     Final    Value:        BLOOD CULTURE RECEIVED NO GROWTH TO DATE CULTURE WILL BE HELD FOR 5 DAYS BEFORE ISSUING A FINAL NEGATIVE REPORT   Report Status PENDING   Incomplete   CULTURE, BLOOD (ROUTINE X 2)     Status: Normal (Preliminary result)   Collection Time   08/06/12 12:17 PM      Component Value Range Status Comment   Specimen Description BLOOD RIGHT HAND   Final    Special Requests BOTTLES DRAWN AEROBIC ONLY 4CC   Final    Culture  Setup Time 08/06/2012 15:56   Final    Culture     Final    Value:        BLOOD CULTURE RECEIVED NO GROWTH TO DATE CULTURE WILL BE HELD FOR 5 DAYS   BEFORE ISSUING A FINAL NEGATIVE REPORT   Report Status PENDING   Incomplete     Medications:  Scheduled Meds:   . amLODipine  10 mg Oral Daily  . heparin  5,000 Units Subcutaneous Q8H  . insulin aspart  0-15 Units Subcutaneous TID WC  . insulin glargine  12 Units Subcutaneous Daily  . metoCLOPramide (REGLAN) injection  10 mg Intravenous Q8H  . multivitamin with minerals  1 tablet Oral Daily  . pantoprazole  40 mg Oral Daily  . piperacillin-tazobactam (ZOSYN)  IV   3.375 g Intravenous Q8H  . DISCONTD: amLODipine  5 mg Oral Daily  . DISCONTD: vancomycin  1,000 mg Intravenous Q24H   Continuous Infusions:   . dextrose 5 % and 0.9% NaCl 75 mL/hr at 08/10/12 0532   PRN Meds:.ondansetron, oxyCODONE-acetaminophen, promethazine, zolpidem  Assessment/Plan: Hypotension  -resolved now  -Possibly secondary to sepsis, fluid boluses, d/c zosyn. Negative procalcitonin and lactic acid levels, repeated blood cultures for sepsis neg workup on 9/24 negative to date  -Hemoglobin stable, cardiac enzymes negative.  -resolved with fluid resuscitation  -concerning for possible liver shock from acute hypotension on 9/24   Altered mental status/decreased responsiveness  -Secondary to above and infection  -resolved now with fluid resuscitation   Nausea and Vomiting  - continue zofran and phenergan IV, added reglan  - clear liquid diet  - ambulate as tolerated  - added protonix  - ? Diabetic Gastroparesis  - trial of metoclopramide  - ask for GI consult today  Diabetic foot ulcer/ Cellulitis  -Status post I&D per orthopedics last pm patient with no further fevers  -DC Zosyn  -MRI with no evidence of osteomyelitis  -Surgery repacked wound , follow up with surgery outpatient 1 week - dc vanc as no MRSA grew on wound culture   Elevated Liver Enzymes and Nausea and Vomiting  -pt is not eating well because of nausea / emesis -continue IV nausea meds  -acute hepatitis panel negative  -liver US reviewed no sig findings -suspect shock liver, now enzymes improving  - requesting GI consult  Hypertension  -monitoring BPs closely, increasing amlodipine to 10 mg daily   DM2 (diabetes mellitus, type 2) with hypoglycemic episodes  -BG trending down and some hypoglycemia, decrease Lantus  -Infection and #1 and poor p.o. Intake contributing to hypoglycemia, follow.  -add dextrose to IVFs since she is not eating   Normocytic Anemia, multifactorial  - hemoccult of  stool negative  - following   Hypokalemia  -repleted orally, following    LOS: 10 days   Chanda Laperle 08/10/2012, 11:58 AM  Ricquel Foulk L. Jaleesa Cervi, MD, CDE, FAAFP [facility] [employer] [place], [place]  [phone]  

## 2012-08-11 ENCOUNTER — Encounter (HOSPITAL_COMMUNITY): Payer: Self-pay | Admitting: *Deleted

## 2012-08-11 ENCOUNTER — Encounter (HOSPITAL_COMMUNITY)
Admission: EM | Disposition: A | Discharge status: Skilled Nursing Facility | Payer: Self-pay | Source: Home / Self Care | Attending: Internal Medicine

## 2012-08-11 ENCOUNTER — Inpatient Hospital Stay (HOSPITAL_COMMUNITY): Payer: BC Managed Care – PPO

## 2012-08-11 HISTORY — PX: ESOPHAGOGASTRODUODENOSCOPY: SHX5428

## 2012-08-11 LAB — GLUCOSE, CAPILLARY
Glucose-Capillary: 199 mg/dL — ABNORMAL HIGH (ref 70–99)
Glucose-Capillary: 234 mg/dL — ABNORMAL HIGH (ref 70–99)

## 2012-08-11 LAB — COMPREHENSIVE METABOLIC PANEL
ALT: 355 U/L — ABNORMAL HIGH (ref 0–35)
AST: 36 U/L (ref 0–37)
Albumin: 2.5 g/dL — ABNORMAL LOW (ref 3.5–5.2)
Alkaline Phosphatase: 147 U/L — ABNORMAL HIGH (ref 39–117)
Calcium: 9 mg/dL (ref 8.4–10.5)
GFR calc Af Amer: 75 mL/min — ABNORMAL LOW (ref >90)
Glucose, Bld: 195 mg/dL — ABNORMAL HIGH (ref 70–99)
Potassium: 3.6 mEq/L (ref 3.5–5.1)
Sodium: 137 mEq/L (ref 135–145)
Total Protein: 6.7 g/dL (ref 6.0–8.3)

## 2012-08-11 LAB — CBC
HCT: 29.4 % — ABNORMAL LOW (ref 36.0–46.0)
Hemoglobin: 9.5 g/dL — ABNORMAL LOW (ref 12.0–15.0)
MCHC: 32.3 g/dL (ref 30.0–36.0)
MCV: 91.3 fL (ref 78.0–100.0)

## 2012-08-11 SURGERY — EGD (ESOPHAGOGASTRODUODENOSCOPY)
Anesthesia: Moderate Sedation

## 2012-08-11 MED ORDER — IOHEXOL 300 MG/ML  SOLN
100.0000 mL | Freq: Once | INTRAMUSCULAR | Status: AC | PRN
  Administered 2012-08-11: 100 mL via INTRAVENOUS

## 2012-08-11 MED ORDER — SODIUM CHLORIDE 0.9 % IV SOLN
INTRAVENOUS | Status: DC
  Administered 2012-08-11: 500 mL via INTRAVENOUS

## 2012-08-11 MED ORDER — BUTAMBEN-TETRACAINE-BENZOCAINE 2-2-14 % EX AERO
INHALATION_SPRAY | CUTANEOUS | Status: DC | PRN
  Administered 2012-08-11: 2 via TOPICAL

## 2012-08-11 MED ORDER — FENTANYL CITRATE 0.05 MG/ML IJ SOLN
INTRAMUSCULAR | Status: AC
  Filled 2012-08-11: qty 4

## 2012-08-11 MED ORDER — MIDAZOLAM HCL 10 MG/2ML IJ SOLN
INTRAMUSCULAR | Status: AC
  Filled 2012-08-11: qty 4

## 2012-08-11 MED ORDER — DIPHENHYDRAMINE HCL 50 MG/ML IJ SOLN
INTRAMUSCULAR | Status: AC
  Filled 2012-08-11: qty 1

## 2012-08-11 MED ORDER — FENTANYL CITRATE 0.05 MG/ML IJ SOLN
INTRAMUSCULAR | Status: DC | PRN
  Administered 2012-08-11 (×3): 25 ug via INTRAVENOUS

## 2012-08-11 MED ORDER — MIDAZOLAM HCL 10 MG/2ML IJ SOLN
INTRAMUSCULAR | Status: DC | PRN
  Administered 2012-08-11 (×3): 2 mg via INTRAVENOUS

## 2012-08-11 NOTE — Brief Op Note (Signed)
See endopro note. Normal EGD. CT abd/pelvis with contrast as next step to further evaluate pain. Keep NPO.

## 2012-08-11 NOTE — Interval H&P Note (Signed)
History and Physical Interval Note:  08/11/2012 12:24 PM  Jennifer [name]  has presented today for surgery, with the diagnosis of nausea, vomiting  The various methods of treatment have been discussed with the patient and family. After consideration of risks, benefits and other options for treatment, the patient has consented to  Procedure(s) (LRB) with comments: ESOPHAGOGASTRODUODENOSCOPY (EGD) (N/A) as a surgical intervention .  The patient's history has been reviewed, patient examined, no change in status, stable for surgery.  I have reviewed the patient's chart and labs.  Questions were answered to the patient's satisfaction.     [name] C.

## 2012-08-11 NOTE — Op Note (Addendum)
[facility] [address] [place], [address]   ENDOSCOPY PROCEDURE REPORT  PATIENT: Jennifer, [name]  MR#: [id number] BIRTHDATE: 1961/06/07 , 51  yrs. old GENDER: Female  ENDOSCOPIST: Charlott Rakes, MD REFERRED BY:  PROCEDURE DATE:  08/11/2012 PROCEDURE:   EGD, diagnostic ASA CLASS:   Class II INDICATIONS:vomiting.   nausea.   abdominal pain. MEDICATIONS: Fentanyl 75 mcg IV, Versed 6 mg IV, and Cetacaine spray x 2  TOPICAL ANESTHETIC:  DESCRIPTION OF PROCEDURE:   After the risks benefits and alternatives of the procedure were thoroughly explained, informed consent was obtained.  The Pentax Gastroscope [id number]  endoscope was introduced through the mouth and advanced to the second portion of the duodenum , limited by Without limitations.   The instrument was slowly withdrawn as the mucosa was fully examined.     FINDINGS: The endoscope was inserted into the oropharynx and esophagus was intubated.  The gastroesophageal junction was noted to be 40 cm from the incisors. Endoscope was advanced into the stomach,which revealed normal-appearing gastric mucosa.  The endoscope was advanced to the duodenal bulb and second portion of duodenum which were unremarkable.  The endoscope was withdrawn back into the stomach and retroflexion revealed a normal proximal stomach.  COMPLICATIONS: None  ENDOSCOPIC IMPRESSION: Normal EGD   RECOMMENDATIONS: CT abd/pelvis to further evaluate pain and if unrevealing may need HIDA scan vs. gastric emptying scan (although use of pain meds will make it difficult for an accurate GE scan).   REPEAT EXAM: N/A  _______________________________ Charlott Rakes, MD    CC:  PATIENT NAME:  Jennifer, [name] MR#: [id number]

## 2012-08-11 NOTE — Progress Notes (Signed)
Pt signed consent. Reported MD had spoken with her regarding scheduled EGD.Marland Kitchen

## 2012-08-11 NOTE — H&P (View-Only) (Signed)
[facility] Progress Note  08/10/2012  HPI  Jennifer [name] is a 51 y.o. female with PMH of DM2 diagnosed 13 years ago (does not know her A1C), who tried to cut what she thought was a callus off of her foot 2 weeks ago. She thought nothing of it until 2 days ago when her foot and ankle began to swell up. Finally today she noticed drainage through her sock and a very foul smell when she took her sock off. This prompted the patient to present to the ED for treatment. Evaluation in the ED demonstrated a non painful diabetic foot ulcer with purulent drainage and obvious edema of leg and erythema around ulcer. Lab work up showed leukocytosis, X-ray of foot didn't show any obvious osteomylitis, patient started on zosyn hospitalist has been asked to admit for IV antibiotics.  Subjective: Pt still having significant nausea and vomiting.  She is not able to tolerate clears diet.  She cannot stand to smell food.    Objective:  Vital signs in last 24 hours: Filed Vitals:   08/09/12 1629 08/09/12 2125 08/10/12 0450 08/10/12 0931  BP: 176/98 174/102 158/80 171/80  Pulse:  100 90 90  Temp:  98.5 F (36.9 C) 98.1 F (36.7 C)   TempSrc:  Oral Oral   Resp:  17 16   Height:      Weight:      SpO2:  98% 98%    Weight change:   Intake/Output Summary (Last 24 hours) at 08/10/12 1158 Last data filed at 08/09/12 1429  Gross per 24 hour  Intake    852 ml  Output      0 ml  Net    852 ml   Lab Results  Component Value Date   HGBA1C 6.7* 07/31/2012   Lab Results  Component Value Date   CREATININE 1.15* 08/09/2012    Review of Systems As above, otherwise all reviewed and reported negative  Physical Exam General - awake, no distress, cooperative  HEENT - NCAT, MMM Lungs - BBS, CTA  CV - normal s1, s2 sounds  Abd - soft, nondistended, no masses, nontender  Ext - Right foot dressings clean and dry  Lab Results: Results for orders placed during the hospital encounter of 07/31/12 (from the  past 24 hour(s))  GLUCOSE, CAPILLARY     Status: Abnormal   Collection Time   08/09/12  2:40 PM      Component Value Range   Glucose-Capillary 205 (*) 70 - 99 mg/dL   Comment 1 Notify RN    GLUCOSE, CAPILLARY     Status: Abnormal   Collection Time   08/09/12  5:34 PM      Component Value Range   Glucose-Capillary 175 (*) 70 - 99 mg/dL   Comment 1 Notify RN    GLUCOSE, CAPILLARY     Status: Abnormal   Collection Time   08/09/12  9:29 PM      Component Value Range   Glucose-Capillary 176 (*) 70 - 99 mg/dL   Comment 1 Notify RN    GLUCOSE, CAPILLARY     Status: Abnormal   Collection Time   08/10/12  2:42 AM      Component Value Range   Glucose-Capillary 213 (*) 70 - 99 mg/dL   Comment 1 Notify RN    GLUCOSE, CAPILLARY     Status: Abnormal   Collection Time   08/10/12  4:54 AM      Component Value Range  Glucose-Capillary 206 (*) 70 - 99 mg/dL   Comment 1 Notify RN    GLUCOSE, CAPILLARY     Status: Abnormal   Collection Time   08/10/12  8:09 AM      Component Value Range   Glucose-Capillary 220 (*) 70 - 99 mg/dL    Micro Results: Recent Results (from the past 240 hour(s))  WOUND CULTURE     Status: Normal   Collection Time   08/01/12  7:19 AM      Component Value Range Status Comment   Specimen Description FOOT   Final    Special Requests NONE   Final    Gram Stain     Final    Value: FEW WBC PRESENT, PREDOMINANTLY PMN     NO SQUAMOUS EPITHELIAL CELLS SEEN     FEW GRAM NEGATIVE RODS     FEW GRAM POSITIVE COCCI IN PAIRS   Culture     Final    Value: FEW GROUP B STREP(S.AGALACTIAE)ISOLATED     Note: TESTING AGAINST S. AGALACTIAE NOT ROUTINELY PERFORMED DUE TO PREDICTABILITY OF AMP/PEN/VAN SUSCEPTIBILITY.   Report Status 08/03/2012 FINAL   Final   CULTURE, BLOOD (ROUTINE X 2)     Status: Normal   Collection Time   08/02/12  4:55 PM      Component Value Range Status Comment   Specimen Description BLOOD RIGHT HAND   Final    Special Requests BOTTLES DRAWN AEROBIC AND  ANAEROBIC 10CC   Final    Culture  Setup Time 08/03/2012 02:40   Final    Culture NO GROWTH 5 DAYS   Final    Report Status 08/09/2012 FINAL   Final   CULTURE, BLOOD (ROUTINE X 2)     Status: Normal   Collection Time   08/02/12  5:02 PM      Component Value Range Status Comment   Specimen Description BLOOD RIGHT ARM   Final    Special Requests BOTTLES DRAWN AEROBIC AND ANAEROBIC 10CC   Final    Culture  Setup Time 08/03/2012 02:40   Final    Culture NO GROWTH 5 DAYS   Final    Report Status 08/09/2012 FINAL   Final   CULTURE, BLOOD (ROUTINE X 2)     Status: Normal (Preliminary result)   Collection Time   08/06/12 12:11 PM      Component Value Range Status Comment   Specimen Description BLOOD RIGHT ARM   Final    Special Requests BOTTLES DRAWN AEROBIC AND ANAEROBIC 5CC   Final    Culture  Setup Time 08/06/2012 15:56   Final    Culture     Final    Value:        BLOOD CULTURE RECEIVED NO GROWTH TO DATE CULTURE WILL BE HELD FOR 5 DAYS BEFORE ISSUING A FINAL NEGATIVE REPORT   Report Status PENDING   Incomplete   CULTURE, BLOOD (ROUTINE X 2)     Status: Normal (Preliminary result)   Collection Time   08/06/12 12:17 PM      Component Value Range Status Comment   Specimen Description BLOOD RIGHT HAND   Final    Special Requests BOTTLES DRAWN AEROBIC ONLY 4CC   Final    Culture  Setup Time 08/06/2012 15:56   Final    Culture     Final    Value:        BLOOD CULTURE RECEIVED NO GROWTH TO DATE CULTURE WILL BE HELD FOR 5 DAYS  BEFORE ISSUING A FINAL NEGATIVE REPORT   Report Status PENDING   Incomplete     Medications:  Scheduled Meds:   . amLODipine  10 mg Oral Daily  . heparin  5,000 Units Subcutaneous Q8H  . insulin aspart  0-15 Units Subcutaneous TID WC  . insulin glargine  12 Units Subcutaneous Daily  . metoCLOPramide (REGLAN) injection  10 mg Intravenous Q8H  . multivitamin with minerals  1 tablet Oral Daily  . pantoprazole  40 mg Oral Daily  . piperacillin-tazobactam (ZOSYN)  IV   3.375 g Intravenous Q8H  . DISCONTD: amLODipine  5 mg Oral Daily  . DISCONTD: vancomycin  1,000 mg Intravenous Q24H   Continuous Infusions:   . dextrose 5 % and 0.9% NaCl 75 mL/hr at 08/10/12 0532   PRN Meds:.ondansetron, oxyCODONE-acetaminophen, promethazine, zolpidem  Assessment/Plan: Hypotension  -resolved now  -Possibly secondary to sepsis, fluid boluses, d/c zosyn. Negative procalcitonin and lactic acid levels, repeated blood cultures for sepsis neg workup on 9/24 negative to date  -Hemoglobin stable, cardiac enzymes negative.  -resolved with fluid resuscitation  -concerning for possible liver shock from acute hypotension on 9/24   Altered mental status/decreased responsiveness  -Secondary to above and infection  -resolved now with fluid resuscitation   Nausea and Vomiting  - continue zofran and phenergan IV, added reglan  - clear liquid diet  - ambulate as tolerated  - added protonix  - ? Diabetic Gastroparesis  - trial of metoclopramide  - ask for GI consult today  Diabetic foot ulcer/ Cellulitis  -Status post I&D per orthopedics last pm patient with no further fevers  -DC Zosyn  -MRI with no evidence of osteomyelitis  -Surgery repacked wound , follow up with surgery outpatient 1 week - dc vanc as no MRSA grew on wound culture   Elevated Liver Enzymes and Nausea and Vomiting  -pt is not eating well because of nausea / emesis -continue IV nausea meds  -acute hepatitis panel negative  -liver US reviewed no sig findings -suspect shock liver, now enzymes improving  - requesting GI consult  Hypertension  -monitoring BPs closely, increasing amlodipine to 10 mg daily   DM2 (diabetes mellitus, type 2) with hypoglycemic episodes  -BG trending down and some hypoglycemia, decrease Lantus  -Infection and #1 and poor p.o. Intake contributing to hypoglycemia, follow.  -add dextrose to IVFs since she is not eating   Normocytic Anemia, multifactorial  - hemoccult of  stool negative  - following   Hypokalemia  -repleted orally, following    LOS: 10 days   Sayra Frisby 08/10/2012, 11:58 AM  Cleora Fleet, MD, CDE, FAAFP [facility] [place], [place]  [phone]

## 2012-08-11 NOTE — Progress Notes (Signed)
[facility] Progress Note  08/11/2012   Subjective: Pt says that she is still nauseated and can't eat.  She is scheduled for EGD this morning.    Objective:  Vital signs in last 24 hours: Filed Vitals:   08/11/12 0245 08/11/12 0510 08/11/12 1201 08/11/12 1206  BP: 148/83 169/84  177/105  Pulse:  75 96   Temp:  98.9 F (37.2 C) 98.9 F (37.2 C)   TempSrc:  Oral Oral   Resp:  16 17   Height:      Weight:      SpO2:  96% 95% 95%   Weight change:   Intake/Output Summary (Last 24 hours) at 08/11/12 1223 Last data filed at 08/11/12 0981  Gross per 24 hour  Intake    980 ml  Output      0 ml  Net    980 ml   Lab Results  Component Value Date   HGBA1C 6.7* 07/31/2012   Lab Results  Component Value Date   CREATININE 1.00 08/11/2012    Review of Systems As above, otherwise all reviewed and reported negative  Physical Exam General - awake, no distress, cooperative  HEENT - NCAT, MMM Lungs - BBS, CTA  CV - normal s1, s2 sounds  Abd - soft, nondistended, no masses, nontender  Ext - Right foot dressings clean and dry  Lab Results: Results for orders placed during the hospital encounter of 07/31/12 (from the past 24 hour(s))  COMPREHENSIVE METABOLIC PANEL     Status: Abnormal   Collection Time   08/10/12 12:24 PM      Component Value Range   Sodium 136  135 - 145 mEq/L   Potassium 3.3 (*) 3.5 - 5.1 mEq/L   Chloride 100  96 - 112 mEq/L   CO2 29  19 - 32 mEq/L   Glucose, Bld 202 (*) 70 - 99 mg/dL   BUN 5 (*) 6 - 23 mg/dL   Creatinine, Ser 1.91  0.50 - 1.10 mg/dL   Calcium 8.6  8.4 - 47.8 mg/dL   Total Protein 6.4  6.0 - 8.3 g/dL   Albumin 2.3 (*) 3.5 - 5.2 g/dL   AST 46 (*) 0 - 37 U/L   ALT 426 (*) 0 - 35 U/L   Alkaline Phosphatase 142 (*) 39 - 117 U/L   Total Bilirubin 0.4  0.3 - 1.2 mg/dL   GFR calc non Af Amer 64 (*) >90 mL/min   GFR calc Af Amer 74 (*) >90 mL/min  CBC     Status: Abnormal   Collection Time   08/10/12 12:24 PM      Component Value  Range   WBC 13.0 (*) 4.0 - 10.5 K/uL   RBC 3.04 (*) 3.87 - 5.11 MIL/uL   Hemoglobin 9.1 (*) 12.0 - 15.0 g/dL   HCT 29.5 (*) 62.1 - 30.8 %   MCV 90.8  78.0 - 100.0 fL   MCH 29.9  26.0 - 34.0 pg   MCHC 33.0  30.0 - 36.0 g/dL   RDW 65.7  84.6 - 96.2 %   Platelets 387  150 - 400 K/uL  GLUCOSE, CAPILLARY     Status: Abnormal   Collection Time   08/10/12  4:32 PM      Component Value Range   Glucose-Capillary 177 (*) 70 - 99 mg/dL   Comment 1 Documented in Chart     Comment 2 Notify RN    GLUCOSE, CAPILLARY     Status: Abnormal  Collection Time   08/10/12  9:10 PM      Component Value Range   Glucose-Capillary 174 (*) 70 - 99 mg/dL   Comment 1 Notify RN    GLUCOSE, CAPILLARY     Status: Abnormal   Collection Time   08/11/12  2:46 AM      Component Value Range   Glucose-Capillary 204 (*) 70 - 99 mg/dL   Comment 1 Notify RN    COMPREHENSIVE METABOLIC PANEL     Status: Abnormal   Collection Time   08/11/12  4:03 AM      Component Value Range   Sodium 137  135 - 145 mEq/L   Potassium 3.6  3.5 - 5.1 mEq/L   Chloride 101  96 - 112 mEq/L   CO2 27  19 - 32 mEq/L   Glucose, Bld 195 (*) 70 - 99 mg/dL   BUN 6  6 - 23 mg/dL   Creatinine, Ser 1.61  0.50 - 1.10 mg/dL   Calcium 9.0  8.4 - 09.6 mg/dL   Total Protein 6.7  6.0 - 8.3 g/dL   Albumin 2.5 (*) 3.5 - 5.2 g/dL   AST 36  0 - 37 U/L   ALT 355 (*) 0 - 35 U/L   Alkaline Phosphatase 147 (*) 39 - 117 U/L   Total Bilirubin 0.4  0.3 - 1.2 mg/dL   GFR calc non Af Amer 65 (*) >90 mL/min   GFR calc Af Amer 75 (*) >90 mL/min  CBC     Status: Abnormal   Collection Time   08/11/12  4:03 AM      Component Value Range   WBC 13.3 (*) 4.0 - 10.5 K/uL   RBC 3.22 (*) 3.87 - 5.11 MIL/uL   Hemoglobin 9.5 (*) 12.0 - 15.0 g/dL   HCT 04.5 (*) 40.9 - 81.1 %   MCV 91.3  78.0 - 100.0 fL   MCH 29.5  26.0 - 34.0 pg   MCHC 32.3  30.0 - 36.0 g/dL   RDW 91.4  78.2 - 95.6 %   Platelets 404 (*) 150 - 400 K/uL  GLUCOSE, CAPILLARY     Status: Abnormal    Collection Time   08/11/12  5:10 AM      Component Value Range   Glucose-Capillary 199 (*) 70 - 99 mg/dL   Comment 1 Notify RN    GLUCOSE, CAPILLARY     Status: Abnormal   Collection Time   08/11/12  7:39 AM      Component Value Range   Glucose-Capillary 234 (*) 70 - 99 mg/dL    Micro Results: Recent Results (from the past 240 hour(s))  CULTURE, BLOOD (ROUTINE X 2)     Status: Normal   Collection Time   08/02/12  4:55 PM      Component Value Range Status Comment   Specimen Description BLOOD RIGHT HAND   Final    Special Requests BOTTLES DRAWN AEROBIC AND ANAEROBIC 10CC   Final    Culture  Setup Time 08/03/2012 02:40   Final    Culture NO GROWTH 5 DAYS   Final    Report Status 08/09/2012 FINAL   Final   CULTURE, BLOOD (ROUTINE X 2)     Status: Normal   Collection Time   08/02/12  5:02 PM      Component Value Range Status Comment   Specimen Description BLOOD RIGHT ARM   Final    Special Requests BOTTLES DRAWN AEROBIC AND ANAEROBIC 10CC  Final    Culture  Setup Time 08/03/2012 02:40   Final    Culture NO GROWTH 5 DAYS   Final    Report Status 08/09/2012 FINAL   Final   CULTURE, BLOOD (ROUTINE X 2)     Status: Normal (Preliminary result)   Collection Time   08/06/12 12:11 PM      Component Value Range Status Comment   Specimen Description BLOOD RIGHT ARM   Final    Special Requests BOTTLES DRAWN AEROBIC AND ANAEROBIC 5CC   Final    Culture  Setup Time 08/06/2012 15:56   Final    Culture     Final    Value:        BLOOD CULTURE RECEIVED NO GROWTH TO DATE CULTURE WILL BE HELD FOR 5 DAYS BEFORE ISSUING A FINAL NEGATIVE REPORT   Report Status PENDING   Incomplete   CULTURE, BLOOD (ROUTINE X 2)     Status: Normal (Preliminary result)   Collection Time   08/06/12 12:17 PM      Component Value Range Status Comment   Specimen Description BLOOD RIGHT HAND   Final    Special Requests BOTTLES DRAWN AEROBIC ONLY 4CC   Final    Culture  Setup Time 08/06/2012 15:56   Final    Culture      Final    Value:        BLOOD CULTURE RECEIVED NO GROWTH TO DATE CULTURE WILL BE HELD FOR 5 DAYS BEFORE ISSUING A FINAL NEGATIVE REPORT   Report Status PENDING   Incomplete     Medications:  Scheduled Meds:   . amLODipine  10 mg Oral Daily  . heparin  5,000 Units Subcutaneous Q8H  . insulin aspart  0-15 Units Subcutaneous TID WC  . insulin glargine  12 Units Subcutaneous Daily  . metoCLOPramide (REGLAN) injection  10 mg Intravenous Q8H  . multivitamin with minerals  1 tablet Oral Daily  . pantoprazole  40 mg Oral Daily  . potassium chloride  10 mEq Intravenous Q1 Hr x 4   Continuous Infusions:   . sodium chloride 500 mL (08/11/12 1215)  . dextrose 5 % and 0.9% NaCl 75 mL/hr at 08/11/12 0808   PRN Meds:.hydrALAZINE, ondansetron, oxyCODONE-acetaminophen, promethazine, zolpidem  Assessment/Plan: Hypotension  -resolved now  -Possibly secondary to sepsis, fluid boluses, d/c zosyn. Negative procalcitonin and lactic acid levels, repeated blood cultures for sepsis neg workup on 9/24 negative to date  -Hemoglobin stable, cardiac enzymes negative.  -resolved with fluid resuscitation  -concerning for possible liver shock from acute hypotension on 9/24   Altered mental status/decreased responsiveness  -Secondary to above and infection  -resolved now with fluid resuscitation   Nausea and Vomiting  - continue zofran and phenergan IV, added reglan  - clear liquid diet  - ambulate as tolerated  - added protonix  - unlikely this is diabetic gastroparesis  - trial of metoclopramide IV  - appreciate GI consult    Diabetic foot ulcer/ Cellulitis  -Status post I&D per orthopedics last pm patient with no further fevers  -DC Zosyn  -MRI with no evidence of osteomyelitis  -Surgery repacked wound , follow up with surgery outpatient 1 week  - dc vanc as no MRSA grew on wound culture   Elevated Liver Enzymes and Nausea and Vomiting  -pt is not eating well because of nausea / emesis    -continue IV nausea meds  -acute hepatitis panel negative  -liver US reviewed no sig findings  -  suspect shock liver, now enzymes improving daily  - requested GI consult   Hypertension  -monitoring BPs closely, increasing amlodipine to 10 mg daily  - added hydralazine IV until pt can take po  DM2 (diabetes mellitus, type 2) with hypoglycemic episodes  -BG stable and controlled at this time -Infection and #1 and poor p.o. Intake contributing to hypoglycemia, follow.  -add dextrose to IVFs since she is not eating   Normocytic Anemia, multifactorial  - hemoccult of stool negative  - following   Hypokalemia  -repleted orally, following    LOS: 11 days   Clanford Johnson 08/11/2012, 12:23 PM  Cleora Fleet, MD, CDE, FAAFP [facility] [place], [place]  [phone]

## 2012-08-12 ENCOUNTER — Encounter (HOSPITAL_COMMUNITY): Payer: Self-pay | Admitting: Gastroenterology

## 2012-08-12 ENCOUNTER — Inpatient Hospital Stay (HOSPITAL_COMMUNITY): Payer: BC Managed Care – PPO

## 2012-08-12 LAB — GLUCOSE, CAPILLARY
Glucose-Capillary: 208 mg/dL — ABNORMAL HIGH (ref 70–99)
Glucose-Capillary: 206 mg/dL — ABNORMAL HIGH (ref 70–99)
Glucose-Capillary: 233 mg/dL — ABNORMAL HIGH (ref 70–99)
Glucose-Capillary: 190 mg/dL — ABNORMAL HIGH (ref 70–99)
Glucose-Capillary: 136 mg/dL — ABNORMAL HIGH (ref 70–99)

## 2012-08-12 LAB — CULTURE, BLOOD (ROUTINE X 2)
Culture: NO GROWTH
Culture: NO GROWTH

## 2012-08-12 MED ORDER — SODIUM CHLORIDE 0.9 % IV SOLN
INTRAVENOUS | Status: DC
  Administered 2012-08-12 – 2012-08-13 (×2): 1000 mL via INTRAVENOUS
  Administered 2012-08-14: 50 mL/h via INTRAVENOUS
  Administered 2012-08-15: 1000 mL via INTRAVENOUS
  Administered 2012-08-16 – 2012-08-22 (×9): via INTRAVENOUS

## 2012-08-12 MED ORDER — DEXAMETHASONE SODIUM PHOSPHATE 4 MG/ML IJ SOLN
4.0000 mg | Freq: Once | INTRAMUSCULAR | Status: AC
  Administered 2012-08-12: 4 mg via INTRAVENOUS
  Filled 2012-08-12: qty 1

## 2012-08-12 MED ORDER — LORAZEPAM 2 MG/ML IJ SOLN
1.0000 mg | INTRAMUSCULAR | Status: DC | PRN
  Administered 2012-08-12 – 2012-08-20 (×14): 1 mg via INTRAVENOUS
  Filled 2012-08-12 (×14): qty 1

## 2012-08-12 MED ORDER — TECHNETIUM TC 99M MEBROFENIN IV KIT
4.6000 | PACK | Freq: Once | INTRAVENOUS | Status: AC | PRN
  Administered 2012-08-12: 5 via INTRAVENOUS

## 2012-08-12 MED ORDER — ONDANSETRON HCL 4 MG/2ML IJ SOLN
4.0000 mg | Freq: Three times a day (TID) | INTRAMUSCULAR | Status: AC
  Administered 2012-08-12 – 2012-08-14 (×8): 4 mg via INTRAVENOUS
  Filled 2012-08-12 (×8): qty 2

## 2012-08-12 NOTE — Progress Notes (Signed)
PT Cancellation Note  Treatment cancelled today due to pt nauseous .  Jennifer [name] [facility] 08/12/2012, 11:32 AM

## 2012-08-12 NOTE — Progress Notes (Signed)
Subjective: Nausea and vomiting persist. Generalized abdominal pain persists.  Objective: Vital signs in last 24 hours: Temp:  [97.4 F (36.3 C)-98.9 F (37.2 C)] 97.9 F (36.6 C) (09/30 0442) Pulse Rate:  [62-108] 71  (09/30 0941) Resp:  [15-21] 18  (09/30 0442) BP: (106-199)/(63-112) 129/67 mmHg (09/30 1106) SpO2:  [95 %-100 %] 98 % (09/30 0442) Weight change:  Last BM Date: 08/07/12  PE: GEN:  Non-toxic appearing, but does appear a bit uncomfortable. ABD:  Soft, but mild generalized tenderness to mild palpation; hypoactive bowel sounds.  Lab Results: CBC    Component Value Date/Time   WBC 13.3* 08/11/2012 0403   RBC 3.22* 08/11/2012 0403   HGB 9.5* 08/11/2012 0403   HCT 29.4* 08/11/2012 0403   PLT 404* 08/11/2012 0403   MCV 91.3 08/11/2012 0403   MCH 29.5 08/11/2012 0403   MCHC 32.3 08/11/2012 0403   RDW 13.3 08/11/2012 0403   LYMPHSABS 2.3 08/09/2012 0345   MONOABS 1.0 08/09/2012 0345   EOSABS 0.4 08/09/2012 0345   BASOSABS 0.1 08/09/2012 0345   CMP     Component Value Date/Time   NA 137 08/11/2012 0403   K 3.6 08/11/2012 0403   CL 101 08/11/2012 0403   CO2 27 08/11/2012 0403   GLUCOSE 195* 08/11/2012 0403   BUN 6 08/11/2012 0403   CREATININE 1.00 08/11/2012 0403   CALCIUM 9.0 08/11/2012 0403   PROT 6.7 08/11/2012 0403   ALBUMIN 2.5* 08/11/2012 0403   AST 36 08/11/2012 0403   ALT 355* 08/11/2012 0403   ALKPHOS 147* 08/11/2012 0403   BILITOT 0.4 08/11/2012 0403   GFRNONAA 65* 08/11/2012 0403   GFRAA 75* 08/11/2012 0403   CT abd/pelvis:  No acute abnormalities  Endoscopy:  Normal  Ultrasound:  Sludge in gallbladder without stones or cholecystitis.  Assessment:  1.  Abdominal pain, generalized and progressive.  Unclear etiology. 2.  Nausea and vomiting.  All these symptoms started AFTER she was hospitalized for diabetic foot ulcer.  No prior history of nausea and vomiting.  Suspect some sort of medication-effect (perhaps prior antibiotics).   Perhaps her narcotics and the  stress of recent surgery could also be triggering some gastroparesis-type symptoms.  Plan:  1.  Scheduled (not prn) antiemetics for the next 48 hours. 2.  Minimize use of narcotics, as feasible. 3.  HIDA scan. 4.  PPI therapy. 5.  Will follow.   Jennifer [name] 08/12/2012, 11:21 AM

## 2012-08-12 NOTE — Progress Notes (Signed)
BP elevated. Given prn dose of hydralazine. Pt calm but complains of still not feeling well at all.

## 2012-08-12 NOTE — Progress Notes (Signed)
Physical Therapy Evaluation Late Note Entry Eval performed by Elon Alas 08/08/12 1111  PT Visit Information  Last PT Received On 08/08/12  Assistance Needed +1  PT Time Calculation  PT Start Time 1054  PT Stop Time 1110  PT Time Calculation (min) 16 min  Subjective Data  Subjective Pt reports that her foot really doesn't hurt.    Restrictions  Weight Bearing Restrictions No  Home Living  Lives With Family  Available Help at Discharge Available PRN/intermittently  Type of Home Apartment  Home Access Stairs to enter  Entrance Stairs-Number of Steps 7  Entrance Stairs-Rails Can reach both  Home Layout One level  Prior Function  Level of Independence Independent  Communication  Communication No difficulties  Cognition  Overall Cognitive Status Appears within functional limits for tasks assessed/performed  Right Lower Extremity Assessment  RLE ROM/Strength/Tone Deficits  RLE ROM/Strength/Tone Deficits swollen right andkle and foot limiting DF/PF ROM by ~10% in both directions.  Strength 3/5 at ankle within available ROM all other joints grossly WNL  Left Lower Extremity Assessment  LLE ROM/Strength/Tone [facility] for tasks assessed  Bed Mobility  Bed Mobility Supine to Sit;Sitting - Scoot to Edge of Bed  Supine to Sit 7: Independent  Transfers  Transfers Sit to Stand;Stand to Sit  Sit to Stand 5: Supervision;From bed;With upper extremity assist  Stand to Sit 5: Supervision;To bed;With upper extremity assist  Details for Transfer Assistance supervision for safety as pt was slow to move and grabbing for bedrail for support.    Ambulation/Gait  Ambulation/Gait Assistance 5: Supervision  Ambulation Distance (Feet) 65 Feet  Assistive device Straight cane  Ambulation/Gait Assistance Details slow antalgic gait pattern, verbal cues needed for safety with cane use and upright posture.    Gait Pattern Step-to pattern;Antalgic  Gait velocity less than 1.8 ft/sec which indicates  risk for recurrent falls.   PT - End of Session  Activity Tolerance Patient limited by fatigue  Patient left in bed;with call bell/phone within reach  PT Assessment  Clinical Impression Statement 51 y.o. female admitted to [facility] with R foot infected diabetic ulcer who now presents with an abnormal gait pattern, difficulty walking and increased edema with decreased ROM to rihgt foot.  She has never had to use a cane before and needs more instruction/reinforcement on proper use and training with cane on the stiars.    PT Recommendation/Assessment Patient needs continued PT services  PT Problem List Decreased strength;Decreased range of motion;Decreased activity tolerance;Decreased balance;Decreased knowledge of use of DME  Barriers to Discharge None  PT Therapy Diagnosis  Difficulty walking;Abnormality of gait;Generalized weakness  PT Plan  PT Frequency Min 3X/week  PT Treatment/Interventions DME instruction;Gait training;Stair training;Functional mobility training;Therapeutic activities;Therapeutic exercise;Balance training;Neuromuscular re-education;Patient/family education  PT Recommendation  Follow Up Recommendations No PT follow up  Equipment Recommended Cane  Individuals Consulted  Consulted and Agree with Results and Recommendations Patient  Acute Rehab PT Goals  PT Goal Formulation With patient  Time For Goal Achievement 08/22/12  Potential to Achieve Goals Good  Pt will go Sit to Stand with modified independence  PT Goal: Sit to Stand - Progress Goal set today  Pt will go Stand to Sit with modified independence  PT Goal: Stand to Sit - Progress Goal set today  Pt will Ambulate 51 - 150 feet;with modified independence;with cane  PT Goal: Ambulate - Progress Goal set today  Pt will Go Up / Down Stairs with supervision;with rail(s);with cane  PT Goal: Up/Down Stairs - Progress  Goal set today  PT General Charges  $$ ACUTE PT VISIT 1 Procedure  PT Evaluation  $Initial PT Evaluation  Tier I 1 Procedure  PT Treatments  $Gait Training 8-22 mins

## 2012-08-12 NOTE — Progress Notes (Signed)
[facility] Progress Note  08/12/2012  HPI  Jennifer [name] is a 51 y.o. female with PMH of DM2 diagnosed 13 years ago (does not know her A1C), who tried to cut what she thought was a callus off of her foot 2 weeks ago. She thought nothing of it until 2 days ago when her foot and ankle began to swell up. Finally today she noticed drainage through her sock and a very foul smell when she took her sock off. This prompted the patient to present to the ED for treatment. Evaluation in the ED demonstrated a non painful diabetic foot ulcer with purulent drainage and obvious edema of leg and erythema around ulcer. Lab work up showed leukocytosis, X-ray of foot didn't show any obvious osteomylitis, patient started on zosyn hospitalist has been asked to admit for IV antibiotics. Pt had an episode of hypotension on 9/24 and developed shock liver.Pt had debridement done in OR and subsequently has developed severe nausea and vomiting.   She is not able to take anything orally without significant nausea.  GI consulted and following.  EGD negative.  HIDA scan pending.   Subjective: Nausea and vomiting is not improving.  Pt did say she wanted to try clears.  She is having generalized abdominal pain.  No constipation or diarrhea.    Objective:  Vital signs in last 24 hours: Filed Vitals:   08/12/12 0941 08/12/12 0951 08/12/12 1106 08/12/12 1400  BP: 177/93 177/93 129/67 130/60  Pulse: 71   80  Temp:    98.6 F (37 C)  TempSrc:    Oral  Resp:    18  Height:      Weight:      SpO2:    98%   Weight change:   Intake/Output Summary (Last 24 hours) at 08/12/12 1635 Last data filed at 08/12/12 1400  Gross per 24 hour  Intake   1845 ml  Output      0 ml  Net   1845 ml   Lab Results  Component Value Date   HGBA1C 6.7* 07/31/2012   Lab Results  Component Value Date   CREATININE 1.00 08/11/2012    Review of Systems As above, otherwise all reviewed and reported negative  Physical Exam General -  awake, no distress, cooperative  HEENT - NCAT, MMM Lungs - BBS, CTA  CV - normal s1, s2 sounds  Abd - soft, nondistended, no masses, nontender  Ext - Right foot dressings clean and dry  Lab Results: Results for orders placed during the hospital encounter of 07/31/12 (from the past 24 hour(s))  GLUCOSE, CAPILLARY     Status: Abnormal   Collection Time   08/11/12  4:46 PM      Component Value Range   Glucose-Capillary 151 (*) 70 - 99 mg/dL  GLUCOSE, CAPILLARY     Status: Abnormal   Collection Time   08/11/12 10:10 PM      Component Value Range   Glucose-Capillary 169 (*) 70 - 99 mg/dL  GLUCOSE, CAPILLARY     Status: Abnormal   Collection Time   08/12/12  2:19 AM      Component Value Range   Glucose-Capillary 208 (*) 70 - 99 mg/dL   Comment 1 Notify RN    GLUCOSE, CAPILLARY     Status: Abnormal   Collection Time   08/12/12  4:30 AM      Component Value Range   Glucose-Capillary 206 (*) 70 - 99 mg/dL   Comment 1  Notify RN    GLUCOSE, CAPILLARY     Status: Abnormal   Collection Time   08/12/12  8:26 AM      Component Value Range   Glucose-Capillary 233 (*) 70 - 99 mg/dL  GLUCOSE, CAPILLARY     Status: Abnormal   Collection Time   08/12/12 10:19 AM      Component Value Range   Glucose-Capillary 190 (*) 70 - 99 mg/dL  GLUCOSE, CAPILLARY     Status: Abnormal   Collection Time   08/12/12  2:06 PM      Component Value Range   Glucose-Capillary 173 (*) 70 - 99 mg/dL   Comment 1 Documented in Chart     Comment 2 Notify RN    GLUCOSE, CAPILLARY     Status: Abnormal   Collection Time   08/12/12  3:39 PM      Component Value Range   Glucose-Capillary 136 (*) 70 - 99 mg/dL    Micro Results: Recent Results (from the past 240 hour(s))  CULTURE, BLOOD (ROUTINE X 2)     Status: Normal   Collection Time   08/02/12  4:55 PM      Component Value Range Status Comment   Specimen Description BLOOD RIGHT HAND   Final    Special Requests BOTTLES DRAWN AEROBIC AND ANAEROBIC 10CC   Final     Culture  Setup Time 08/03/2012 02:40   Final    Culture NO GROWTH 5 DAYS   Final    Report Status 08/09/2012 FINAL   Final   CULTURE, BLOOD (ROUTINE X 2)     Status: Normal   Collection Time   08/02/12  5:02 PM      Component Value Range Status Comment   Specimen Description BLOOD RIGHT ARM   Final    Special Requests BOTTLES DRAWN AEROBIC AND ANAEROBIC 10CC   Final    Culture  Setup Time 08/03/2012 02:40   Final    Culture NO GROWTH 5 DAYS   Final    Report Status 08/09/2012 FINAL   Final   CULTURE, BLOOD (ROUTINE X 2)     Status: Normal   Collection Time   08/06/12 12:11 PM      Component Value Range Status Comment   Specimen Description BLOOD RIGHT ARM   Final    Special Requests BOTTLES DRAWN AEROBIC AND ANAEROBIC 5CC   Final    Culture  Setup Time 08/06/2012 15:56   Final    Culture NO GROWTH 5 DAYS   Final    Report Status 08/12/2012 FINAL   Final   CULTURE, BLOOD (ROUTINE X 2)     Status: Normal   Collection Time   08/06/12 12:17 PM      Component Value Range Status Comment   Specimen Description BLOOD RIGHT HAND   Final    Special Requests BOTTLES DRAWN AEROBIC ONLY 4CC   Final    Culture  Setup Time 08/06/2012 15:56   Final    Culture NO GROWTH 5 DAYS   Final    Report Status 08/12/2012 FINAL   Final     Medications:  Scheduled Meds:   . amLODipine  10 mg Oral Daily  . heparin  5,000 Units Subcutaneous Q8H  . insulin aspart  0-15 Units Subcutaneous TID WC  . insulin glargine  12 Units Subcutaneous Daily  . metoCLOPramide (REGLAN) injection  10 mg Intravenous Q8H  . multivitamin with minerals  1 tablet Oral Daily  . ondansetron  4 mg Intravenous TID WC & HS  . pantoprazole  40 mg Oral Daily   Continuous Infusions:   . sodium chloride 1,000 mL (08/12/12 0954)  . DISCONTD: dextrose 5 % and 0.9% NaCl 75 mL/hr at 08/12/12 0233   PRN Meds:.hydrALAZINE, oxyCODONE-acetaminophen, promethazine, technetium TC 42M mebrofenin, zolpidem, DISCONTD:  ondansetron  Assessment/Plan: Hypotension  -resolved now  -Possibly secondary to sepsis, fluid boluses, d/c zosyn. Negative procalcitonin and lactic acid levels, repeated blood cultures for sepsis neg workup on 9/24 negative to date  -Hemoglobin stable, cardiac enzymes negative.  -resolved with fluid resuscitation  -concerning for possible liver shock from acute hypotension on 9/24   Altered mental status/decreased responsiveness  -Secondary to above and infection  -resolved now with fluid resuscitation   Nausea and Vomiting  - continue zofran and phenergan IV, added reglan  - NPO at this time  - ambulate as tolerated  - added protonix  - ? Diabetic Gastroparesis (thought to be less likely) - trial of metoclopramide did not help symptoms  - GI consulted: HIDA Scan, see orders  Diabetic foot ulcer/ Cellulitis  -Status post I&D per orthopedics last pm patient with no further fevers  -DC Zosyn (could the antibiotics have caused this severe n/v?) -MRI with no evidence of osteomyelitis  -Surgery repacked wound , follow up with surgery outpatient 1 week  - dc vanc as no MRSA grew on wound culture   Elevated Liver Enzymes and Nausea and Vomiting  -pt is not eating well because of nausea / emesis  -continue IV nausea meds  -acute hepatitis panel negative  -liver US reviewed no sig findings  -suspect shock liver, now enzymes improving  - requesting GI consult   Hypertension  -monitoring BPs closely, continue hydralazine IV  DM2 (diabetes mellitus, type 2) with hypoglycemic episodes  -BG trending down and some hypoglycemia, decrease Lantus  -Infection and #1 and poor p.o. Intake contributing to hypoglycemia, follow.  -added dextrose to IVFs since she is not eating   Normocytic Anemia, multifactorial  - hemoccult of stool negative  - following   Hypokalemia  -repleted orally, following    LOS: 12 days   Clanford Johnson 08/12/2012, 4:35 PM  Cleora Fleet, MD, CDE,  FAAFP [facility] [place], [place]  [phone]

## 2012-08-12 NOTE — Progress Notes (Signed)
Given dose of IV decadron. Pt reports her nausea has improved post ativan given earlier

## 2012-08-13 DIAGNOSIS — R112 Nausea with vomiting, unspecified: Secondary | ICD-10-CM | POA: Diagnosis not present

## 2012-08-13 DIAGNOSIS — I10 Essential (primary) hypertension: Secondary | ICD-10-CM | POA: Diagnosis not present

## 2012-08-13 LAB — GLUCOSE, CAPILLARY: Glucose-Capillary: 124 mg/dL — ABNORMAL HIGH (ref 70–99)

## 2012-08-13 MED ORDER — METOPROLOL TARTRATE 1 MG/ML IV SOLN
5.0000 mg | Freq: Four times a day (QID) | INTRAVENOUS | Status: DC | PRN
  Administered 2012-08-13 – 2012-08-15 (×3): 5 mg via INTRAVENOUS
  Filled 2012-08-13 (×3): qty 5

## 2012-08-13 NOTE — Progress Notes (Signed)
PT Cancellation Note  Treatment cancelled today due to medical issues with patient which prohibited therapy. Note pt has increased BP today so will hold off on PT today   Donnetta Hail 08/13/2012, 1:12 PM

## 2012-08-13 NOTE — Progress Notes (Signed)
Subjective: Still reports nausea and abdominal pain.  Objective: Vital signs in last 24 hours: Temp:  [98.6 F (37 C)-99.7 F (37.6 C)] 98.7 F (37.1 C) (10/01 0621) Pulse Rate:  [80-109] 109  (10/01 0621) Resp:  [14-18] 15  (10/01 0621) BP: (129-169)/(60-89) 169/89 mmHg (10/01 0621) SpO2:  [96 %-100 %] 100 % (10/01 0621) Weight change:  Last BM Date: 08/07/12  PE: GEN:  NAD ABD:  Soft, mild generalized tenderness, active bowel sounds, no peritonitis NEURO:  Flat affect  Lab Results: CBC    Component Value Date/Time   WBC 13.3* 08/11/2012 0403   RBC 3.22* 08/11/2012 0403   HGB 9.5* 08/11/2012 0403   HCT 29.4* 08/11/2012 0403   PLT 404* 08/11/2012 0403   MCV 91.3 08/11/2012 0403   MCH 29.5 08/11/2012 0403   MCHC 32.3 08/11/2012 0403   RDW 13.3 08/11/2012 0403   LYMPHSABS 2.3 08/09/2012 0345   MONOABS 1.0 08/09/2012 0345   EOSABS 0.4 08/09/2012 0345   BASOSABS 0.1 08/09/2012 0345   CMP     Component Value Date/Time   NA 137 08/11/2012 0403   K 3.6 08/11/2012 0403   CL 101 08/11/2012 0403   CO2 27 08/11/2012 0403   GLUCOSE 195* 08/11/2012 0403   BUN 6 08/11/2012 0403   CREATININE 1.00 08/11/2012 0403   CALCIUM 9.0 08/11/2012 0403   PROT 6.7 08/11/2012 0403   ALBUMIN 2.5* 08/11/2012 0403   AST 36 08/11/2012 0403   ALT 355* 08/11/2012 0403   ALKPHOS 147* 08/11/2012 0403   BILITOT 0.4 08/11/2012 0403   GFRNONAA 65* 08/11/2012 0403   GFRAA 75* 08/11/2012 0403   Assessment:  1.  Abdominal pain.  Unclear etiology.  Extensive negative evaluation. 2.  Nausea and vomiting.  Wonder if these symptoms are medication-related (antibiotics, anesthesia during diabetic leg ulcer surgery).  Patient may have component of diabetic gastroparesis as well.  Plan:  1.  Start clear liquid diet. 2.  Continue scheduled antiemetics for another 24 hours.  Continue Protonix. 3.  Minimize narcotics as clinically feasible. 4.  Will follow.   Freddy Jaksch 08/13/2012, 10:16 AM

## 2012-08-13 NOTE — Progress Notes (Signed)
[facility] Progress Note  08/13/2012  HPI  Jennifer [name] is a 51 y.o. female with PMH of DM2 diagnosed 13 years ago, who tried to cut what she thought was a callus off of her foot 2 weeks ago. She thought nothing of it until 2 days ago when her foot and ankle began to swell up. Finally today she noticed drainage through her sock and a very foul smell when she took her sock off. This prompted the patient to present to the ED for treatment. Evaluation in the ED demonstrated a non painful diabetic foot ulcer with purulent drainage and obvious edema of leg and erythema around ulcer. Lab work up showed leukocytosis, X-ray of foot didn't show any obvious osteomylitis, patient started on zosyn hospitalist has been asked to admit for IV antibiotics. Pt had debridement done in OR and subsequently has developed severe nausea and vomiting.  Pt had an episode of hypotension on 9/24 and developed shock liver. She is not able to take anything orally without significant nausea. GI consulted and following. EGD negative. HIDA scan and CT scan negative.    Subjective: Pt still having nausea but ativan did help to improve her symptoms.  Rectal exam done by nurse yesterday revealed no impaction.  She is ready to try clear liquids today.    Objective:  Vital signs in last 24 hours: Filed Vitals:   08/12/12 1400 08/12/12 2115 08/13/12 0621 08/13/12 1030  BP: 130/60 131/65 169/89 181/93  Pulse: 80 89 109 98  Temp: 98.6 F (37 C) 99.7 F (37.6 C) 98.7 F (37.1 C) 98.9 F (37.2 C)  TempSrc: Oral Oral Oral Oral  Resp: 18 14 15 16   Height:      Weight:      SpO2: 98% 96% 100% 98%   Weight change:   Intake/Output Summary (Last 24 hours) at 08/13/12 1149 Last data filed at 08/13/12 0915  Gross per 24 hour  Intake    955 ml  Output      1 ml  Net    954 ml   Lab Results  Component Value Date   HGBA1C 6.7* 07/31/2012   Lab Results  Component Value Date   CREATININE 1.00 08/11/2012    Review of  Systems As above, otherwise all reviewed and reported negative  Physical Exam General - awake, no distress, cooperative  HEENT - NCAT, MMM Lungs - BBS, CTA  CV - normal s1, s2 sounds  Abd - soft, nondistended, no masses, mild nonspecific tenderness Ext - Right foot dressings clean and dry  Lab Results: Results for orders placed during the hospital encounter of 07/31/12 (from the past 24 hour(s))  GLUCOSE, CAPILLARY     Status: Abnormal   Collection Time   08/12/12  2:06 PM      Component Value Range   Glucose-Capillary 173 (*) 70 - 99 mg/dL   Comment 1 Documented in Chart     Comment 2 Notify RN    GLUCOSE, CAPILLARY     Status: Abnormal   Collection Time   08/12/12  3:39 PM      Component Value Range   Glucose-Capillary 136 (*) 70 - 99 mg/dL  GLUCOSE, CAPILLARY     Status: Abnormal   Collection Time   08/12/12  9:44 PM      Component Value Range   Glucose-Capillary 122 (*) 70 - 99 mg/dL  GLUCOSE, CAPILLARY     Status: Abnormal   Collection Time   08/13/12  7:50  AM      Component Value Range   Glucose-Capillary 124 (*) 70 - 99 mg/dL   Comment 1 Documented in Chart     Comment 2 Notify RN      Micro Results: Recent Results (from the past 240 hour(s))  CULTURE, BLOOD (ROUTINE X 2)     Status: Normal   Collection Time   08/06/12 12:11 PM      Component Value Range Status Comment   Specimen Description BLOOD RIGHT ARM   Final    Special Requests BOTTLES DRAWN AEROBIC AND ANAEROBIC 5CC   Final    Culture  Setup Time 08/06/2012 15:56   Final    Culture NO GROWTH 5 DAYS   Final    Report Status 08/12/2012 FINAL   Final   CULTURE, BLOOD (ROUTINE X 2)     Status: Normal   Collection Time   08/06/12 12:17 PM      Component Value Range Status Comment   Specimen Description BLOOD RIGHT HAND   Final    Special Requests BOTTLES DRAWN AEROBIC ONLY 4CC   Final    Culture  Setup Time 08/06/2012 15:56   Final    Culture NO GROWTH 5 DAYS   Final    Report Status 08/12/2012 FINAL    Final     Medications:  Scheduled Meds:   . amLODipine  10 mg Oral Daily  . dexamethasone  4 mg Intravenous Once  . heparin  5,000 Units Subcutaneous Q8H  . insulin aspart  0-15 Units Subcutaneous TID WC  . insulin glargine  12 Units Subcutaneous Daily  . multivitamin with minerals  1 tablet Oral Daily  . ondansetron  4 mg Intravenous TID WC & HS  . pantoprazole  40 mg Oral Daily   Continuous Infusions:   . sodium chloride 1,000 mL (08/12/12 0954)   PRN Meds:.LORazepam, oxyCODONE-acetaminophen, promethazine, technetium TC 11M mebrofenin, zolpidem, DISCONTD: hydrALAZINE  Assessment/Plan: Hypotension  -resolved now  -Possibly secondary to sepsis, fluid boluses, d/c zosyn. Negative procalcitonin and lactic acid levels, repeated blood cultures for sepsis neg workup on 9/24 negative to date  -Hemoglobin stable, cardiac enzymes negative.  -resolved with fluid resuscitation  -concerning for possible liver shock from acute hypotension on 9/24   Altered mental status/decreased responsiveness  -Secondary to above and infection  -resolved now with fluid resuscitation   Nausea and Vomiting  - continue zofran and phenergan IV, added reglan  - NPO at this time  - ambulate as tolerated  - added protonix  - ? Diabetic Gastroparesis (thought to be less likely)  - trial of metoclopramide did not help symptoms  - GI consulted: HIDA Scan, see orders  - try clears today  Diabetic foot ulcer/ Cellulitis  -Status post I&D per orthopedics last pm patient with no further fevers  -DC'd Zosyn (could the antibiotics have caused this severe n/v?)  -MRI with no evidence of osteomyelitis  -Surgery repacked wound , follow up with surgery outpatient 1 week  - dc'd vanc as no MRSA grew on wound culture   Elevated Liver Enzymes and Nausea and Vomiting  -pt is not eating well because of nausea / emesis  -continue IV nausea meds  -acute hepatitis panel negative  -liver US reviewed no sig findings    -suspect shock liver, now enzymes improving  - GI consult appreciated -neg HIDA scan  Hypertension  -monitoring BPs closely, lopressor IV   DM2 (diabetes mellitus, type 2) with hypoglycemic episodes  -BG trending down  and some hypoglycemia, decrease Lantus  - no recent hypoglycemia    Normocytic Anemia, multifactorial  - hemoccult of stool negative  - following   Hypokalemia  -repleted orally, following    LOS: 13 days   Clanford Johnson 08/13/2012, 11:49 AM  Cleora Fleet, MD, CDE, FAAFP [facility] [place], [place]  [phone]

## 2012-08-14 DIAGNOSIS — K3184 Gastroparesis: Secondary | ICD-10-CM

## 2012-08-14 DIAGNOSIS — E1142 Type 2 diabetes mellitus with diabetic polyneuropathy: Secondary | ICD-10-CM

## 2012-08-14 DIAGNOSIS — E1149 Type 2 diabetes mellitus with other diabetic neurological complication: Secondary | ICD-10-CM

## 2012-08-14 LAB — CBC
Hemoglobin: 10.4 g/dL — ABNORMAL LOW (ref 12.0–15.0)
MCH: 29.7 pg (ref 26.0–34.0)
MCV: 90.6 fL (ref 78.0–100.0)
Platelets: 524 10*3/uL — ABNORMAL HIGH (ref 150–400)
RBC: 3.5 MIL/uL — ABNORMAL LOW (ref 3.87–5.11)
WBC: 9.8 10*3/uL (ref 4.0–10.5)

## 2012-08-14 LAB — GLUCOSE, CAPILLARY
Glucose-Capillary: 84 mg/dL (ref 70–99)
Glucose-Capillary: 102 mg/dL — ABNORMAL HIGH (ref 70–99)
Glucose-Capillary: 99 mg/dL (ref 70–99)

## 2012-08-14 LAB — COMPREHENSIVE METABOLIC PANEL
BUN: 12 mg/dL (ref 6–23)
CO2: 27 mEq/L (ref 19–32)
Calcium: 9.4 mg/dL (ref 8.4–10.5)
Chloride: 101 mEq/L (ref 96–112)
Creatinine, Ser: 0.84 mg/dL (ref 0.50–1.10)
GFR calc Af Amer: >90 mL/min (ref >90)
GFR calc non Af Amer: 80 mL/min — ABNORMAL LOW (ref >90)
Total Bilirubin: 0.4 mg/dL (ref 0.3–1.2)

## 2012-08-14 LAB — MAGNESIUM: Magnesium: 1.8 mg/dL (ref 1.5–2.5)

## 2012-08-14 MED ORDER — BISACODYL 5 MG PO TBEC
10.0000 mg | DELAYED_RELEASE_TABLET | Freq: Every day | ORAL | Status: DC | PRN
  Administered 2012-08-14: 10 mg via ORAL
  Filled 2012-08-14: qty 2

## 2012-08-14 MED ORDER — POTASSIUM CHLORIDE CRYS ER 20 MEQ PO TBCR
40.0000 meq | EXTENDED_RELEASE_TABLET | Freq: Two times a day (BID) | ORAL | Status: AC
  Administered 2012-08-14: 40 meq via ORAL
  Filled 2012-08-14 (×2): qty 2

## 2012-08-14 MED ORDER — SENNOSIDES-DOCUSATE SODIUM 8.6-50 MG PO TABS
2.0000 | ORAL_TABLET | Freq: Two times a day (BID) | ORAL | Status: DC
  Administered 2012-08-15 – 2012-08-28 (×9): 2 via ORAL
  Filled 2012-08-14 (×30): qty 2

## 2012-08-14 NOTE — Progress Notes (Signed)
Physical Therapy Treatment Patient Details Name: Jennifer [name] MRN: [id number] DOB: 10/13/1961 Today's Date: 08/14/2012 Time: 4098-1191 PT Time Calculation (min): 20 min  PT Assessment / Plan / Recommendation Comments on Treatment Session  pt is markedly different this session, requiring max verbal cues for safety/fall prevention as well as requiring significantly more assist; if slow progress/poor safety continues pt may need SNF; pt has had multiple cancels since last PT visit    Follow Up Recommendations  Post acute inpatient rehab;Home health PT;Supervision for mobility/OOB [facility] with 24hr assist vs rehab setting)    Barriers to Discharge        Equipment Recommendations  Rolling walker with 5" wheels    Recommendations for Other Services    Frequency Min 3X/week   Plan Discharge plan remains appropriate;Frequency remains appropriate    Precautions / Restrictions Precautions Precautions: Fall   Pertinent Vitals/Pain     Mobility  Bed Mobility Bed Mobility: Supine to Sit;Sitting - Scoot to Edge of Bed Supine to Sit: 3: Mod assist;4: Min assist Sitting - Scoot to [place] of Bed: 4: Min assist Details for Bed Mobility Assistance: pt requires increased time Transfers Transfers: Sit to Stand;Stand to Sit Sit to Stand: 4: Min assist;From bed;From toilet Stand to Sit: 4: Min assist;To bed;To toilet Details for Transfer Assistance: pt requiring multi modal cues for safety and hand placement Ambulation/Gait Ambulation/Gait Assistance: 3: Mod assist Ambulation Distance (Feet): 10 Feet (x2) Assistive device: Rolling walker Ambulation/Gait Assistance Details: multi-modal cues for RW safety, balance; Gait Pattern: Step-to pattern;Trunk flexed    Exercises     PT Diagnosis:    PT Problem List:   PT Treatment Interventions:     PT Goals Acute Rehab PT Goals Time For Goal Achievement: 08/22/12 Potential to Achieve Goals: Good Pt will go Sit to Stand: with modified  independence PT Goal: Sit to Stand - Progress: Not progressing Pt will go Stand to Sit: with modified independence PT Goal: Stand to Sit - Progress: Not progressing Pt will Ambulate: 51 - 150 feet;with modified independence;with cane PT Goal: Ambulate - Progress: Not progressing  Visit Information  Last PT Received On: 08/14/12 Assistance Needed: +1 (pt very unsafe; +2 would be helpful)    Subjective Data  Subjective: pt verbalizes very little; cousin present Patient Stated Goal: none   Cognition  Overall Cognitive Status: Appears within functional limits for tasks assessed/performed Arousal/Alertness: Lethargic Behavior During Session: Flat affect Cognition - Other Comments: pt somnolent; initiates little; follows commands; pt cousin is present and states this is not normal affect for pt at all    Balance     End of Session PT - End of Session Equipment Utilized During Treatment: Gait belt Activity Tolerance: Patient limited by fatigue Patient left: in bed;with call bell/phone within reach   GP     [facility] 08/14/2012, 3:40 PM

## 2012-08-14 NOTE — Progress Notes (Signed)
Feeling sick with nausea No fevers and WBC down below 10k Foot looks good on exam with no pockets of fluid accepting minimal packing   A/P:  Foot abscess s/p I&D  Continue packing with tiny amount of iodoform gauze My impression is that she is off antibiotics and since foot looks good and numbers fine I guess that's OK Will plan to see her again in a couple of days but if [facility] home would like to see in office in a week

## 2012-08-14 NOTE — Progress Notes (Signed)
[facility] Progress Note  08/14/2012  HPI  Jennifer [name] is a 51 y.o. female with PMH of DM2 diagnosed 13 years ago, who tried to cut what she thought was a callus off of her foot 2 weeks ago. She thought nothing of it until 2 days ago when her foot and ankle began to swell up. Finally today she noticed drainage through her sock and a very foul smell when she took her sock off. This prompted the patient to present to the ED for treatment. Evaluation in the ED demonstrated a non painful diabetic foot ulcer with purulent drainage and obvious edema of leg and erythema around ulcer. Lab work up showed leukocytosis, X-ray of foot didn't show any obvious osteomylitis, patient started on zosyn hospitalist has been asked to admit for IV antibiotics. Pt had debridement done in OR and subsequently has developed severe nausea and vomiting.  Pt had an episode of hypotension on 9/24 and developed shock liver. She is not able to take anything orally without significant nausea. GI consulted and following. EGD negative. HIDA scan and CT scan negative.    Subjective: Pt still having nausea but ativan did help to improve her symptoms.  Rectal exam done by nurse yesterday revealed no impaction.  Advance diet as tolerated.   Objective:  Vital signs in last 24 hours: Filed Vitals:   08/14/12 0800 08/14/12 0940 08/14/12 1212 08/14/12 1403  BP: 183/99 182/94 156/92 157/81  Pulse: 100  87 93  Temp: 98.2 F (36.8 C)  98.9 F (37.2 C) 98.7 F (37.1 C)  TempSrc: Oral  Oral Oral  Resp: 16  18 15   Height:      Weight:      SpO2: 99%  98% 100%   Weight change:   Intake/Output Summary (Last 24 hours) at 08/14/12 1723 Last data filed at 08/14/12 1537  Gross per 24 hour  Intake 1830.6 ml  Output    806 ml  Net 1024.6 ml   Lab Results  Component Value Date   HGBA1C 6.7* 07/31/2012   Lab Results  Component Value Date   CREATININE 0.84 08/14/2012    Review of Systems As above, otherwise all reviewed  and reported negative  Physical Exam General - awake, no distress, cooperative  HEENT - NCAT, MMM Lungs - BBS, CTA  CV - normal s1, s2 sounds  Abd - soft, nondistended, no masses, mild nonspecific tenderness Ext - Right foot dressings clean and dry  Lab Results: Results for orders placed during the hospital encounter of 07/31/12 (from the past 24 hour(s))  GLUCOSE, CAPILLARY     Status: Abnormal   Collection Time   08/13/12 10:00 PM      Component Value Range   Glucose-Capillary 105 (*) 70 - 99 mg/dL  GLUCOSE, CAPILLARY     Status: Normal   Collection Time   08/14/12  2:47 AM      Component Value Range   Glucose-Capillary 84  70 - 99 mg/dL  COMPREHENSIVE METABOLIC PANEL     Status: Abnormal   Collection Time   08/14/12  3:35 AM      Component Value Range   Sodium 137  135 - 145 mEq/L   Potassium 3.2 (*) 3.5 - 5.1 mEq/L   Chloride 101  96 - 112 mEq/L   CO2 27  19 - 32 mEq/L   Glucose, Bld 100 (*) 70 - 99 mg/dL   BUN 12  6 - 23 mg/dL   Creatinine, Ser 1.61  0.50 - 1.10 mg/dL   Calcium 9.4  8.4 - 16.1 mg/dL   Total Protein 7.0  6.0 - 8.3 g/dL   Albumin 2.9 (*) 3.5 - 5.2 g/dL   AST 19  0 - 37 U/L   ALT 156 (*) 0 - 35 U/L   Alkaline Phosphatase 119 (*) 39 - 117 U/L   Total Bilirubin 0.4  0.3 - 1.2 mg/dL   GFR calc non Af Amer 80 (*) >90 mL/min   GFR calc Af Amer >90  >90 mL/min  CBC     Status: Abnormal   Collection Time   08/14/12  3:35 AM      Component Value Range   WBC 9.8  4.0 - 10.5 K/uL   RBC 3.50 (*) 3.87 - 5.11 MIL/uL   Hemoglobin 10.4 (*) 12.0 - 15.0 g/dL   HCT 09.6 (*) 04.5 - 40.9 %   MCV 90.6  78.0 - 100.0 fL   MCH 29.7  26.0 - 34.0 pg   MCHC 32.8  30.0 - 36.0 g/dL   RDW 81.1  91.4 - 78.2 %   Platelets 524 (*) 150 - 400 K/uL  MAGNESIUM     Status: Normal   Collection Time   08/14/12  3:35 AM      Component Value Range   Magnesium 1.8  1.5 - 2.5 mg/dL  GLUCOSE, CAPILLARY     Status: Abnormal   Collection Time   08/14/12  8:02 AM      Component Value  Range   Glucose-Capillary 102 (*) 70 - 99 mg/dL  GLUCOSE, CAPILLARY     Status: Normal   Collection Time   08/14/12 12:18 PM      Component Value Range   Glucose-Capillary 99  70 - 99 mg/dL  GLUCOSE, CAPILLARY     Status: Normal   Collection Time   08/14/12  4:33 PM      Component Value Range   Glucose-Capillary 82  70 - 99 mg/dL   Comment 1 Documented in Chart     Comment 2 Notify RN      Micro Results: Recent Results (from the past 240 hour(s))  CULTURE, BLOOD (ROUTINE X 2)     Status: Normal   Collection Time   08/06/12 12:11 PM      Component Value Range Status Comment   Specimen Description BLOOD RIGHT ARM   Final    Special Requests BOTTLES DRAWN AEROBIC AND ANAEROBIC 5CC   Final    Culture  Setup Time 08/06/2012 15:56   Final    Culture NO GROWTH 5 DAYS   Final    Report Status 08/12/2012 FINAL   Final   CULTURE, BLOOD (ROUTINE X 2)     Status: Normal   Collection Time   08/06/12 12:17 PM      Component Value Range Status Comment   Specimen Description BLOOD RIGHT HAND   Final    Special Requests BOTTLES DRAWN AEROBIC ONLY 4CC   Final    Culture  Setup Time 08/06/2012 15:56   Final    Culture NO GROWTH 5 DAYS   Final    Report Status 08/12/2012 FINAL   Final     Medications:  Scheduled Meds:    . amLODipine  10 mg Oral Daily  . heparin  5,000 Units Subcutaneous Q8H  . insulin aspart  0-15 Units Subcutaneous TID WC  . insulin glargine  12 Units Subcutaneous Daily  . multivitamin with minerals  1 tablet Oral  Daily  . ondansetron  4 mg Intravenous TID WC & HS  . pantoprazole  40 mg Oral Daily   Continuous Infusions:    . sodium chloride 50 mL/hr (08/14/12 1532)   PRN Meds:.LORazepam, metoprolol, oxyCODONE-acetaminophen, promethazine, zolpidem  Assessment/Plan: Hypotension  -resolved now  -Possibly secondary to sepsis, fluid boluses, d/c zosyn. Negative procalcitonin and lactic acid levels, repeated blood cultures for sepsis neg workup on 9/24 negative to  date  -Hemoglobin stable, cardiac enzymes negative.  -resolved with fluid resuscitation  -concerning for possible liver shock from acute hypotension on 9/24   Altered mental status/decreased responsiveness  -Secondary to above and infection  -resolved now with fluid resuscitation   Nausea and Vomiting  - continue zofran and phenergan IV, added reglan  - NPO at this time  - ambulate as tolerated  - added protonix  - ? Diabetic Gastroparesis (thought to be less likely)  - trial of metoclopramide did not help symptoms  - GI consulted: HIDA Scan, see orders  - advance diet as tolerated.  Diabetic foot ulcer/ Cellulitis  -Status post I&D per orthopedics last pm patient with no further fevers  -DC'd Zosyn (could the antibiotics have caused this severe n/v?)  -MRI with no evidence of osteomyelitis  -Surgery repacked wound , follow up with surgery outpatient 1 week  - dc'd vanc as no MRSA grew on wound culture   Elevated Liver Enzymes and Nausea and Vomiting  -improved.  -continue IV nausea meds  -acute hepatitis panel negative  -liver US reviewed no sig findings  -suspect shock liver, now enzymes improving  - GI consult appreciated -neg HIDA scan  Hypertension  -monitoring BPs closely, lopressor IV   DM2 (diabetes mellitus, type 2) with hypoglycemic episodes  -BG trending down and some hypoglycemia, decrease Lantus  - no recent hypoglycemia    Normocytic Anemia, multifactorial  - hemoccult of stool negative  - following   Hypokalemia  -repleted orally, following    LOS: 14 days   Durell Lofaso 08/14/2012, 5:23 PM  Kathlen Mody, MD,  [facility] [place], [place]  [phone]

## 2012-08-15 ENCOUNTER — Inpatient Hospital Stay (HOSPITAL_COMMUNITY): Payer: BC Managed Care – PPO

## 2012-08-15 ENCOUNTER — Other Ambulatory Visit (HOSPITAL_COMMUNITY): Payer: BC Managed Care – PPO

## 2012-08-15 LAB — GLUCOSE, CAPILLARY
Glucose-Capillary: 70 mg/dL (ref 70–99)
Glucose-Capillary: 144 mg/dL — ABNORMAL HIGH (ref 70–99)
Glucose-Capillary: 105 mg/dL — ABNORMAL HIGH (ref 70–99)
Glucose-Capillary: 71 mg/dL (ref 70–99)

## 2012-08-15 MED ORDER — DEXTROSE 50 % IV SOLN
25.0000 mL | Freq: Once | INTRAVENOUS | Status: AC | PRN
  Administered 2012-08-15: 25 mL via INTRAVENOUS

## 2012-08-15 MED ORDER — METOCLOPRAMIDE HCL 5 MG/ML IJ SOLN
5.0000 mg | Freq: Four times a day (QID) | INTRAMUSCULAR | Status: DC
  Administered 2012-08-15 – 2012-08-19 (×18): 5 mg via INTRAVENOUS
  Administered 2012-08-19: 07:00:00 via INTRAVENOUS
  Administered 2012-08-20 – 2012-08-21 (×6): 5 mg via INTRAVENOUS
  Filled 2012-08-15 (×3): qty 1
  Filled 2012-08-15: qty 2
  Filled 2012-08-15 (×3): qty 1
  Filled 2012-08-15: qty 2
  Filled 2012-08-15 (×8): qty 1
  Filled 2012-08-15: qty 2
  Filled 2012-08-15: qty 1
  Filled 2012-08-15: qty 2
  Filled 2012-08-15 (×2): qty 1
  Filled 2012-08-15: qty 2
  Filled 2012-08-15 (×5): qty 1
  Filled 2012-08-15 (×2): qty 2
  Filled 2012-08-15 (×2): qty 1
  Filled 2012-08-15: qty 2

## 2012-08-15 MED ORDER — DEXTROSE 50 % IV SOLN
INTRAVENOUS | Status: AC
  Administered 2012-08-15: 25 mL via INTRAVENOUS
  Filled 2012-08-15: qty 50

## 2012-08-15 NOTE — Progress Notes (Addendum)
Nausea but no pain in foot Afeb  WBC<10k Foot looks good with no fluctuance and nothing to pack anymore  S/p I&D foot abscess  Off antibiotics Dry dressing is all she needs now May get wet and shower I will sign off for now but please call if any problems Follow up with me in 10-14 days would be fine

## 2012-08-15 NOTE — Progress Notes (Signed)
Physical Therapy Treatment Patient Details Name: Jennifer [name] MRN: [id number] DOB: [birth year] Today's Date: 08/15/2012 Time: 2952-8413 PT Time Calculation (min): 24 min  PT Assessment / Plan / Recommendation Comments on Treatment Session  pt continues with slow progress this session; pt with memory and cognitive deficits; unsafe to D/C home at this point ( pt is by herself from 5am until 9pm as her boyfriend works 2 jobs ---per pt cousin)    Follow Up Recommendations  Post acute inpatient rehab    Barriers to Discharge        Equipment Recommendations  Rolling walker with 5" wheels    Recommendations for Other Services OT consult  Frequency Min 3X/week   Plan Discharge plan remains appropriate;Frequency remains appropriate    Precautions / Restrictions Precautions Precautions: Fall   Pertinent Vitals/Pain    Mobility  Bed Mobility Bed Mobility: Supine to Sit Supine to Sit: 4: Min assist Sitting - Scoot to [place] of Bed: 4: Min assist;4: Min guard Details for Bed Mobility Assistance: requires increased time and cues for safety and self assist Transfers Transfers: Sit to Stand;Stand to Sit Sit to Stand: 4: Min assist;From bed Stand to Sit: 4: Min assist;3: Mod assist;To chair/3-in-1 Details for Transfer Assistance: multi-modal cues and then hand over hand assist required for correct hand placement; cues for wt shift and assist with initial balance Ambulation/Gait Ambulation/Gait Assistance: 1: +2 Total assist Ambulation/Gait: Patient Percentage: 50% Ambulation Distance (Feet): 14 Feet Assistive device: Rolling walker Ambulation/Gait Assistance Details: pt requiring multi-modal cues  and assist for safe RW technique; pt unable to consistently follow commands; pt too weak to amb greater than 14 feet before needing chair brought to her/ second person assist Gait Pattern: Step-to pattern;Trunk flexed;Decreased step length - right;Decreased step length - left    Exercises      PT Diagnosis:    PT Problem List:   PT Treatment Interventions:     PT Goals Acute Rehab PT Goals PT Goal Formulation: With patient Time For Goal Achievement: 08/22/12 Potential to Achieve Goals: Good Pt will go Supine/Side to Sit: with supervision PT Goal: Supine/Side to Sit - Progress: Goal set today Pt will go Sit to Stand: with supervision PT Goal: Sit to Stand - Progress: Revised due to lack of progress Pt will go Stand to Sit: with supervision PT Goal: Stand to Sit - Progress: Revised due to lack of progress Pt will Ambulate: 51 - 150 feet;with min assist;with rolling walker PT Goal: Ambulate - Progress: Revised due to lack of progress Pt will Go Up / Down Stairs: Other (comment) PT Goal: Up/Down Stairs - Progress: Discontinued (comment)  Visit Information  Last PT Received On: 08/15/12 Assistance Needed: +2    Subjective Data  Subjective: pt interacting slightly more today Patient Stated Goal: none stated   Cognition  Overall Cognitive Status: Impaired Area of Impairment: Following commands;Safety/judgement;Awareness of errors;Awareness of deficits Arousal/Alertness: Awake/alert Orientation Level: Oriented X4 / Intact (pt looks at calendar to answer questions) Behavior During Session: Flat affect Following Commands: Follows one step commands inconsistently Safety/Judgement: Decreased awareness of safety precautions;Decreased safety judgement for tasks assessed;Impulsive;Decreased awareness of need for assistance Safety/Judgement - Other Comments: pt exhibits no carryover with transfer or amb safety;  Awareness of Errors: Assistance required to identify errors made Awareness of Deficits: pt unable to use walker correctly or identify safe techniques; unaware that she needs assitance of 2 people to prevent falls today during amb Cognition - Other Comments: pt interacting more but continues  to have flat affect; pt with marked change from baseline--wass working 11hrs/day  for [employer] prior to adm;  Pt does not recall therapist seeing her yesterday; Pt friend/cousin present and in agreement that pt can't be alone at home;    Balance  Static Standing Balance Static Standing - Balance Support: Bilateral upper extremity supported Static Standing - Level of Assistance: 4: Min assist;3: Mod assist  End of Session PT - End of Session Equipment Utilized During Treatment: Gait belt Activity Tolerance: Patient limited by fatigue;Treatment limited secondary to medical complications (Comment) (nausea)   GP     [facility] 08/15/2012, 12:57 PM

## 2012-08-15 NOTE — Progress Notes (Signed)
Hypoglycemic Event  CBG: 68  Treatment: D50 IV 25 mL  Symptoms: None  Follow-up CBG: Time:0418 CBG Result:118  Possible Reasons for Event: Inadequate meal intake  Comments/MD notified:MD Lavera Guise notified    Jennifer [name], Jennifer [name]  Remember to initiate Hypoglycemia Order Set & complete

## 2012-08-15 NOTE — Progress Notes (Signed)
[facility] Progress Note  08/15/2012  HPI  Jennifer [name] is a 51 y.o. female with PMH of DM2 diagnosed 13 years ago, who tried to cut what she thought was a callus off of her foot 2 weeks ago. She thought nothing of it until 2 days ago when her foot and ankle began to swell up. Finally today she noticed drainage through her sock and a very foul smell when she took her sock off. This prompted the patient to present to the ED for treatment. Evaluation in the ED demonstrated a non painful diabetic foot ulcer with purulent drainage and obvious edema of leg and erythema around ulcer. Lab work up showed leukocytosis, X-ray of foot didn't show any obvious osteomylitis, patient started on zosyn hospitalist has been asked to admit for IV antibiotics. Pt had debridement done in OR and subsequently has developed severe nausea and vomiting.  Pt had an episode of hypotension on 9/24 and developed shock liver. She is not able to take anything orally without significant nausea. GI consulted and following. EGD negative. HIDA scan and CT scan negative.    Subjective: Pt still having nausea but ativan did help to improve her symptoms.    Advance diet as tolerated.   Objective:  Vital signs in last 24 hours: Filed Vitals:   08/14/12 2120 08/15/12 0455 08/15/12 0936 08/15/12 1422  BP: 159/82 167/79 150/82 145/90  Pulse: 96 98  99  Temp: 98.9 F (37.2 C) 98.5 F (36.9 C)  98.4 F (36.9 C)  TempSrc: Oral Oral  Axillary  Resp: 16 17  18   Height:      Weight:      SpO2: 98% 99%  98%   Weight change:   Intake/Output Summary (Last 24 hours) at 08/15/12 1714 Last data filed at 08/15/12 1300  Gross per 24 hour  Intake   1066 ml  Output      0 ml  Net   1066 ml   Lab Results  Component Value Date   HGBA1C 6.7* 07/31/2012   Lab Results  Component Value Date   CREATININE 0.84 08/14/2012    Review of Systems As above, otherwise all reviewed and reported negative  Physical Exam General - awake,  no distress, cooperative  HEENT - NCAT, MMM Lungs - BBS, CTA  CV - normal s1, s2 sounds  Abd - soft, nondistended, no masses, mild nonspecific tenderness Ext - Right foot dressings clean and dry  Lab Results: Results for orders placed during the hospital encounter of 07/31/12 (from the past 24 hour(s))  GLUCOSE, CAPILLARY     Status: Normal   Collection Time   08/14/12  9:23 PM      Component Value Range   Glucose-Capillary 72  70 - 99 mg/dL   Comment 1 Notify RN    GLUCOSE, CAPILLARY     Status: Normal   Collection Time   08/15/12  2:46 AM      Component Value Range   Glucose-Capillary 70  70 - 99 mg/dL   Comment 1 Notify RN    GLUCOSE, CAPILLARY     Status: Abnormal   Collection Time   08/15/12  3:49 AM      Component Value Range   Glucose-Capillary 68 (*) 70 - 99 mg/dL   Comment 1 Notify RN    GLUCOSE, CAPILLARY     Status: Abnormal   Collection Time   08/15/12  4:22 AM      Component Value Range  Glucose-Capillary 144 (*) 70 - 99 mg/dL   Comment 1 Notify RN    GLUCOSE, CAPILLARY     Status: Abnormal   Collection Time   08/15/12  5:01 AM      Component Value Range   Glucose-Capillary 136 (*) 70 - 99 mg/dL   Comment 1 Notify RN    GLUCOSE, CAPILLARY     Status: Normal   Collection Time   08/15/12  8:01 AM      Component Value Range   Glucose-Capillary 91  70 - 99 mg/dL   Comment 1 Documented in Chart     Comment 2 Notify RN    GLUCOSE, CAPILLARY     Status: Abnormal   Collection Time   08/15/12 11:56 AM      Component Value Range   Glucose-Capillary 105 (*) 70 - 99 mg/dL   Comment 1 Documented in Chart     Comment 2 Notify RN      Micro Results: Recent Results (from the past 240 hour(s))  CULTURE, BLOOD (ROUTINE X 2)     Status: Normal   Collection Time   08/06/12 12:11 PM      Component Value Range Status Comment   Specimen Description BLOOD RIGHT ARM   Final    Special Requests BOTTLES DRAWN AEROBIC AND ANAEROBIC 5CC   Final    Culture  Setup Time  08/06/2012 15:56   Final    Culture NO GROWTH 5 DAYS   Final    Report Status 08/12/2012 FINAL   Final   CULTURE, BLOOD (ROUTINE X 2)     Status: Normal   Collection Time   08/06/12 12:17 PM      Component Value Range Status Comment   Specimen Description BLOOD RIGHT HAND   Final    Special Requests BOTTLES DRAWN AEROBIC ONLY 4CC   Final    Culture  Setup Time 08/06/2012 15:56   Final    Culture NO GROWTH 5 DAYS   Final    Report Status 08/12/2012 FINAL   Final     Medications:  Scheduled Meds:    . amLODipine  10 mg Oral Daily  . heparin  5,000 Units Subcutaneous Q8H  . insulin aspart  0-15 Units Subcutaneous TID WC  . metoCLOPramide (REGLAN) injection  5 mg Intravenous Q6H  . multivitamin with minerals  1 tablet Oral Daily  . pantoprazole  40 mg Oral Daily  . potassium chloride  40 mEq Oral BID  . senna-docusate  2 tablet Oral BID  . DISCONTD: insulin glargine  12 Units Subcutaneous Daily   Continuous Infusions:    . sodium chloride 1,000 mL (08/15/12 1045)   PRN Meds:.bisacodyl, dextrose, LORazepam, metoprolol, oxyCODONE-acetaminophen, promethazine, zolpidem  Assessment/Plan: Hypotension  -resolved now  -Possibly secondary to sepsis, fluid boluses, d/c zosyn. Negative procalcitonin and lactic acid levels, repeated blood cultures for sepsis neg workup on 9/24 negative to date  -Hemoglobin stable, cardiac enzymes negative.  -resolved with fluid resuscitation  -concerning for possible liver shock from acute hypotension on 9/24   Altered mental status/decreased responsiveness  -Secondary to above and infection  -resolved now with fluid resuscitation   Nausea and Vomiting  - continue zofran and phenergan IV, added reglan  - ambulate as tolerated  - added protonix  - ? Diabetic Gastroparesis we ordered a gastric emptying study which she could not undergo, so we are starting reglan IV.  - GI consulted: HIDA Scan, see orders  - advance diet as tolerated.  Diabetic  foot ulcer/ Cellulitis  -Status post I&D per orthopedics last pm patient with no further fevers  -DC'd Zosyn (could the antibiotics have caused this severe n/v?)  -MRI with no evidence of osteomyelitis  -Surgery repacked wound , follow up with surgery outpatient 1 week  - dc'd vanc as no MRSA grew on wound culture   Elevated Liver Enzymes and Nausea and Vomiting  -improved.  -continue IV nausea meds  -acute hepatitis panel negative  -liver US reviewed no sig findings  -suspect shock liver, now enzymes improving  - GI consult appreciated -neg HIDA scan  Hypertension  -monitoring BPs closely, lopressor IV   DM2 (diabetes mellitus, type 2) with hypoglycemic episodes  -BG trending down and some hypoglycemia, decrease Lantus  - no recent hypoglycemia    Normocytic Anemia, multifactorial  - hemoccult of stool negative  - following   Hypokalemia  -repleted orally, following    LOS: 15 days   Emilina Smarr 08/15/2012, 5:14 PM  Kathlen Mody, MD,  [facility] [place], [place]  [phone]

## 2012-08-16 DIAGNOSIS — R5381 Other malaise: Secondary | ICD-10-CM

## 2012-08-16 DIAGNOSIS — G92 Toxic encephalopathy: Secondary | ICD-10-CM

## 2012-08-16 LAB — COMPREHENSIVE METABOLIC PANEL
AST: 17 U/L (ref 0–37)
Albumin: 3 g/dL — ABNORMAL LOW (ref 3.5–5.2)
BUN: 13 mg/dL (ref 6–23)
Calcium: 9.6 mg/dL (ref 8.4–10.5)
Chloride: 104 mEq/L (ref 96–112)
Creatinine, Ser: 0.83 mg/dL (ref 0.50–1.10)
Total Protein: 7.1 g/dL (ref 6.0–8.3)

## 2012-08-16 LAB — GLUCOSE, CAPILLARY
Glucose-Capillary: 80 mg/dL (ref 70–99)
Glucose-Capillary: 112 mg/dL — ABNORMAL HIGH (ref 70–99)
Glucose-Capillary: 113 mg/dL — ABNORMAL HIGH (ref 70–99)
Glucose-Capillary: 93 mg/dL (ref 70–99)

## 2012-08-16 LAB — CBC
HCT: 33.3 % — ABNORMAL LOW (ref 36.0–46.0)
Hemoglobin: 11.1 g/dL — ABNORMAL LOW (ref 12.0–15.0)
MCH: 30 pg (ref 26.0–34.0)
MCV: 90 fL (ref 78.0–100.0)
RBC: 3.7 MIL/uL — ABNORMAL LOW (ref 3.87–5.11)
WBC: 8.8 10*3/uL (ref 4.0–10.5)

## 2012-08-16 MED ORDER — METOCLOPRAMIDE HCL 5 MG PO TABS: 10.0000 mg | ORAL_TABLET | Freq: Four times a day (QID) | ORAL | Status: DC

## 2012-08-16 MED ORDER — AMLODIPINE BESYLATE 10 MG PO TABS: 10.0000 mg | ORAL_TABLET | Freq: Every day | ORAL | Status: DC

## 2012-08-16 MED ORDER — PANTOPRAZOLE SODIUM 40 MG PO TBEC: 40.0000 mg | DELAYED_RELEASE_TABLET | Freq: Every day | ORAL | Status: DC

## 2012-08-16 MED ORDER — PROMETHAZINE HCL 12.5 MG PO TABS: 25.0000 mg | ORAL_TABLET | ORAL | Status: DC | PRN

## 2012-08-16 MED ORDER — SENNOSIDES-DOCUSATE SODIUM 8.6-50 MG PO TABS: 2.0000 | ORAL_TABLET | Freq: Two times a day (BID) | ORAL | Status: DC

## 2012-08-16 MED ORDER — BISACODYL 5 MG PO TBEC: 10.0000 mg | DELAYED_RELEASE_TABLET | Freq: Every day | ORAL | Status: DC | PRN

## 2012-08-16 MED ORDER — POTASSIUM CHLORIDE CRYS ER 20 MEQ PO TBCR
40.0000 meq | EXTENDED_RELEASE_TABLET | Freq: Once | ORAL | Status: DC
  Filled 2012-08-16 (×2): qty 2

## 2012-08-16 MED ORDER — GLUCERNA SHAKE PO LIQD
237.0000 mL | Freq: Two times a day (BID) | ORAL | Status: DC
  Administered 2012-08-17: 237 mL via ORAL
  Filled 2012-08-16 (×14): qty 237

## 2012-08-16 MED ORDER — POTASSIUM CHLORIDE 10 MEQ/100ML IV SOLN
10.0000 meq | INTRAVENOUS | Status: AC
  Administered 2012-08-16 (×2): 10 meq via INTRAVENOUS
  Filled 2012-08-16 (×3): qty 100

## 2012-08-16 NOTE — Consult Note (Signed)
Physical Medicine and Rehabilitation Consult Reason for Consult: Deconditioning Referring Physician: [facility]   HPI: Jennifer [name] is a 51 y.o. female with history of diabetes mellitus with peripheral neuropathy who was admitted 08/01/2012. Patient had tried to cut a callus off of her right foot approximately 2 weeks ago. Developed increased swelling and discoloration of the foot without smelling odor. Low-grade temperature with white blood cell count of 14,600. X-rays and MRI right foot show soft tissue swelling skin ulceration without evidence of osteomyelitis. Patient is placed on Zosyn and vancomycin. Orthopedic services Dr. Jerl Santos consulted and underwent irrigation and debridement at bedside of foot ulcer 08/05/2012. Dressing changes now are merely dry dressing with wound healing nicely. Hospital course complicated by hypotension as well as altered mental status felt to be secondary to possible sepsis. Patient developed nausea vomiting with gastroenterology consulted 08/10/2012 Dr. Bosie Clos. Ultrasound of the abdomen unremarkable showing no stones there was some mild gallbladder wall thickening. Endoscopy study 08/11/2012 again unremarkable . It was thought nausea vomiting most likely related to medication antibiotics and monitored. Maintained on subcutaneous heparin for DVT prophylaxis. Physical therapy evaluation completed an ongoing and await occupational therapy evaluation. Recommendations were made for physical medicine rehabilitation consult to consider inpatient rehabilitation services secondary to deconditioning as well as memory with some cognitive deficits.   Review of Systems  Gastrointestinal: Positive for constipation.  Musculoskeletal: Positive for myalgias.  All other systems reviewed and are negative.   Past Medical History  Diagnosis Date  . Diabetes mellitus   . Ulcer of lower limb, unspecified     ulcer on top of foot (right)   Past Surgical History  Procedure Date  .  Esophagogastroduodenoscopy 08/11/2012    Procedure: ESOPHAGOGASTRODUODENOSCOPY (EGD);  Surgeon: Shirley Friar, MD;  Location: [facility];  Service: Endoscopy;  Laterality: N/A;   Family History  Problem Relation Age of Onset  . Diabetes Mother    Social History:  reports that she has never smoked. She does not have any smokeless tobacco history on file. She reports that she does not drink alcohol or use illicit drugs. Allergies: No Known Allergies Medications Prior to Admission  Medication Sig Dispense Refill  . insulin glargine (LANTUS) 100 UNIT/ML injection Inject 25 Units into the skin 2 (two) times daily.       . ciprofloxacin (CIPRO) 500 MG tablet Take 500 mg by mouth 2 (two) times daily.        Home: Home Living Lives With: Family Available Help at Discharge: Available PRN/intermittently Type of Home: Apartment Home Access: Stairs to enter [employer] of Steps: 7 Entrance Stairs-Rails: Can reach both Home Layout: One level  Functional History:   Functional Status:  Mobility: Bed Mobility Bed Mobility: Supine to Sit Supine to Sit: 4: Min assist Sitting - Scoot to [employer] of Bed: 4: Min assist;4: Min guard Transfers Transfers: Sit to Stand;Stand to [employer] to Stand: 4: Min assist;From bed Stand to Sit: 4: Min assist;3: Mod assist;To chair/3-in-1 Ambulation/Gait Ambulation/Gait Assistance: 1: +2 Total assist Ambulation/Gait: Patient Percentage: 50% Ambulation Distance (Feet): 14 Feet Assistive device: Rolling walker Ambulation/Gait Assistance Details: pt requiring multi-modal cues  and assist for safe RW technique; pt unable to consistently follow commands; pt too weak to amb greater than 14 feet before needing chair brought to her/ second person assist Gait Pattern: Step-to pattern;Trunk flexed;Decreased step length - right;Decreased step length - left Gait velocity: less than 1.8 ft/sec which indicates risk for recurrent falls.     ADL:  Cognition: Cognition Arousal/Alertness: Awake/alert Orientation Level: Oriented X4 Cognition Overall Cognitive Status: Impaired Area of Impairment: Following commands;Safety/judgement;Awareness of errors;Awareness of deficits Arousal/Alertness: Awake/alert Orientation Level: Oriented X4 / Intact (pt looks at calendar to answer questions) Behavior During Session: Flat affect Following Commands: Follows one step commands inconsistently Safety/Judgement: Decreased awareness of safety precautions;Decreased safety judgement for tasks assessed;Impulsive;Decreased awareness of need for assistance Safety/Judgement - Other Comments: pt exhibits no carryover with transfer or amb safety;  Awareness of Errors: Assistance required to identify errors made Awareness of Deficits: pt unable to use walker correctly or identify safe techniques; unaware that she needs assitance of 2 people to prevent falls today during amb Cognition - Other Comments: pt interacting more but continues to have flat affect; pt with marked change from baseline--wass working 11hrs/day for [employer] prior to adm;  Pt does not recall therapist seeing her yesterday; Pt friend/cousin present and in agreement that pt can't be alone at home;  Blood pressure 159/74, pulse 98, temperature 98.2 F (36.8 C), temperature source Oral, resp. rate 16, height 5\' 8"  (1.727 m), weight 70.9 kg (156 lb 4.9 oz), last menstrual period 02/09/2011, SpO2 100.00%. Physical Exam  Vitals reviewed. Constitutional: She is oriented to person, place, and time. She appears well-developed.  HENT:  Head: Normocephalic.  Eyes:       Pupils round and reactive to light  Neck: Neck supple. No thyromegaly present.  Cardiovascular: Normal rate and regular rhythm.   Pulmonary/Chest: Breath sounds normal. No respiratory distress. She has no wheezes.  Abdominal: Soft. Bowel sounds are normal. She exhibits no distension. There is no tenderness. There is no rebound.   Neurological: She is alert and oriented to person, place, and time.       Patient with flat affect. She participated fully with exam. She was able to give her age, date of birth and that she lives with her boyfriend. She can also name the hospital in [place] [facility] [place]. Follow three-step commands.  Skin:       Right foot dry dressing in place without odor over the sole/metatarsal area. Minimally tender    Results for orders placed during the hospital encounter of 07/31/12 (from the past 24 hour(s))  GLUCOSE, CAPILLARY     Status: Normal   Collection Time   08/15/12  8:01 AM      Component Value Range   Glucose-Capillary 91  70 - 99 mg/dL   Comment 1 Documented in Chart     Comment 2 Notify RN    GLUCOSE, CAPILLARY     Status: Abnormal   Collection Time   08/15/12 11:56 AM      Component Value Range   Glucose-Capillary 105 (*) 70 - 99 mg/dL   Comment 1 Documented in Chart     Comment 2 Notify RN    GLUCOSE, CAPILLARY     Status: Normal   Collection Time   08/15/12  6:28 PM      Component Value Range   Glucose-Capillary 90  70 - 99 mg/dL   Comment 1 Documented in Chart     Comment 2 Notify RN    GLUCOSE, CAPILLARY     Status: Normal   Collection Time   08/15/12  9:24 PM      Component Value Range   Glucose-Capillary 71  70 - 99 mg/dL   Comment 1 Notify RN    GLUCOSE, CAPILLARY     Status: Normal   Collection Time   08/15/12 11:51 PM  Component Value Range   Glucose-Capillary 79  70 - 99 mg/dL   Comment 1 Notify RN    GLUCOSE, CAPILLARY     Status: Normal   Collection Time   08/16/12  3:10 AM      Component Value Range   Glucose-Capillary 80  70 - 99 mg/dL   Comment 1 Notify RN    COMPREHENSIVE METABOLIC PANEL     Status: Abnormal   Collection Time   08/16/12  3:30 AM      Component Value Range   Sodium 142  135 - 145 mEq/L   Potassium 3.2 (*) 3.5 - 5.1 mEq/L   Chloride 104  96 - 112 mEq/L   CO2 26  19 - 32 mEq/L   Glucose, Bld 83  70 - 99 mg/dL   BUN 13   6 - 23 mg/dL   Creatinine, Ser 1.61  0.50 - 1.10 mg/dL   Calcium 9.6  8.4 - 09.6 mg/dL   Total Protein 7.1  6.0 - 8.3 g/dL   Albumin 3.0 (*) 3.5 - 5.2 g/dL   AST 17  0 - 37 U/L   ALT 98 (*) 0 - 35 U/L   Alkaline Phosphatase 115  39 - 117 U/L   Total Bilirubin 0.6  0.3 - 1.2 mg/dL   GFR calc non Af Amer 81 (*) >90 mL/min   GFR calc Af Amer >90  >90 mL/min  CBC     Status: Abnormal   Collection Time   08/16/12  3:30 AM      Component Value Range   WBC 8.8  4.0 - 10.5 K/uL   RBC 3.70 (*) 3.87 - 5.11 MIL/uL   Hemoglobin 11.1 (*) 12.0 - 15.0 g/dL   HCT 04.5 (*) 40.9 - 81.1 %   MCV 90.0  78.0 - 100.0 fL   MCH 30.0  26.0 - 34.0 pg   MCHC 33.3  30.0 - 36.0 g/dL   RDW 91.4  78.2 - 95.6 %   Platelets 516 (*) 150 - 400 K/uL   No results found.  Assessment/Plan: Diagnosis: right foot wound 1. Does the need for close, 24 hr/day medical supervision in concert with the patient's rehab needs make it unreasonable for this patient to be served in a less intensive setting? No 2. Co-Morbidities requiring supervision/potential complications: n.a 3. Due to bladder management, bowel management, safety, skin/wound care and disease management, does the patient require 24 hr/day rehab nursing? No 4. Does the patient require coordinated care of a physician, rehab nurse, PT and OT to address physical and functional deficits in the context of the above medical diagnosis(es)? No Addressing deficits in the following areas: balance and endurance 5. Can the patient actively participate in an intensive therapy program of at least 3 hrs of therapy per day at least 5 days per week? No 6. The potential for patient to make measurable gains while on inpatient rehab is fair 7. Anticipated functional outcomes upon discharge from inpatient rehab are n/a with PT, n/a with OT, n/a with SLP. 8. Estimated rehab length of stay to reach the above functional goals is: n/a 9. Does the patient have adequate social supports to  accommodate these discharge functional goals? Yes 10. Anticipated D/C setting: Home 11. Anticipated post D/C treatments: HH therapy 12. Overall Rehab/Functional Prognosis: excellent  RECOMMENDATIONS: This patient's condition is appropriate for continued rehabilitative care in the following setting: [facility] PT, RN Patient has agreed to participate in recommended program. Yes Note that insurance  prior authorization may be required for reimbursement for recommended care.  Comment:  Ivory Broad, MD  08/16/2012

## 2012-08-16 NOTE — Progress Notes (Signed)
[facility] Progress Note  08/16/2012  HPI  Jennifer [name] is a 51 y.o. female with PMH of DM2 diagnosed 13 years ago, who tried to cut what she thought was a callus off of her foot 2 weeks ago. She thought nothing of it until 2 days ago when her foot and ankle began to swell up. Finally today she noticed drainage through her sock and a very foul smell when she took her sock off. This prompted the patient to present to the ED for treatment. Evaluation in the ED demonstrated a non painful diabetic foot ulcer with purulent drainage and obvious edema of leg and erythema around ulcer. Lab work up showed leukocytosis, X-ray of foot didn't show any obvious osteomylitis, patient started on zosyn hospitalist has been asked to admit for IV antibiotics. Pt had debridement done in OR and subsequently has developed severe nausea and vomiting.  Pt had an episode of hypotension on 9/24 and developed shock liver. She is not able to take anything orally without significant nausea. GI consulted and following. EGD negative. HIDA scan and CT scan negative.    Subjective: Pt still having nausea but ativan did help to improve her symptoms.    Advance diet as tolerated.   Objective:  Vital signs in last 24 hours: Filed Vitals:   08/16/12 0455 08/16/12 0957 08/16/12 1158 08/16/12 1439  BP: 158/84 170/96 87/57 145/81  Pulse: 98  92 102  Temp: 98.4 F (36.9 C)   98.8 F (37.1 C)  TempSrc: Oral   Oral  Resp: 16   16  Height:      Weight:      SpO2: 99%  97% 98%   Weight change:   Intake/Output Summary (Last 24 hours) at 08/16/12 1919 Last data filed at 08/16/12 1405  Gross per 24 hour  Intake 1429.17 ml  Output      0 ml  Net 1429.17 ml   Lab Results  Component Value Date   HGBA1C 6.7* 07/31/2012   Lab Results  Component Value Date   CREATININE 0.83 08/16/2012    Review of Systems As above, otherwise all reviewed and reported negative  Physical Exam General - awake, no distress, cooperative   HEENT - NCAT, MMM Lungs - BBS, CTA  CV - normal s1, s2 sounds  Abd - soft, nondistended, no masses, mild nonspecific tenderness Ext - Right foot dressings clean and dry  Lab Results: Results for orders placed during the hospital encounter of 07/31/12 (from the past 24 hour(s))  GLUCOSE, CAPILLARY     Status: Normal   Collection Time   08/15/12  9:24 PM      Component Value Range   Glucose-Capillary 71  70 - 99 mg/dL   Comment 1 Notify RN    GLUCOSE, CAPILLARY     Status: Normal   Collection Time   08/15/12 11:51 PM      Component Value Range   Glucose-Capillary 79  70 - 99 mg/dL   Comment 1 Notify RN    GLUCOSE, CAPILLARY     Status: Normal   Collection Time   08/16/12  3:10 AM      Component Value Range   Glucose-Capillary 80  70 - 99 mg/dL   Comment 1 Notify RN    COMPREHENSIVE METABOLIC PANEL     Status: Abnormal   Collection Time   08/16/12  3:30 AM      Component Value Range   Sodium 142  135 - 145 mEq/L  Potassium 3.2 (*) 3.5 - 5.1 mEq/L   Chloride 104  96 - 112 mEq/L   CO2 26  19 - 32 mEq/L   Glucose, Bld 83  70 - 99 mg/dL   BUN 13  6 - 23 mg/dL   Creatinine, Ser 2.95  0.50 - 1.10 mg/dL   Calcium 9.6  8.4 - 28.4 mg/dL   Total Protein 7.1  6.0 - 8.3 g/dL   Albumin 3.0 (*) 3.5 - 5.2 g/dL   AST 17  0 - 37 U/L   ALT 98 (*) 0 - 35 U/L   Alkaline Phosphatase 115  39 - 117 U/L   Total Bilirubin 0.6  0.3 - 1.2 mg/dL   GFR calc non Af Amer 81 (*) >90 mL/min   GFR calc Af Amer >90  >90 mL/min  CBC     Status: Abnormal   Collection Time   08/16/12  3:30 AM      Component Value Range   WBC 8.8  4.0 - 10.5 K/uL   RBC 3.70 (*) 3.87 - 5.11 MIL/uL   Hemoglobin 11.1 (*) 12.0 - 15.0 g/dL   HCT 13.2 (*) 44.0 - 10.2 %   MCV 90.0  78.0 - 100.0 fL   MCH 30.0  26.0 - 34.0 pg   MCHC 33.3  30.0 - 36.0 g/dL   RDW 72.5  36.6 - 44.0 %   Platelets 516 (*) 150 - 400 K/uL  GLUCOSE, CAPILLARY     Status: Normal   Collection Time   08/16/12  4:57 AM      Component Value Range    Glucose-Capillary 80  70 - 99 mg/dL   Comment 1 Notify RN    GLUCOSE, CAPILLARY     Status: Abnormal   Collection Time   08/16/12  8:00 AM      Component Value Range   Glucose-Capillary 112 (*) 70 - 99 mg/dL   Comment 1 Documented in Chart     Comment 2 Notify RN    GLUCOSE, CAPILLARY     Status: Abnormal   Collection Time   08/16/12 12:19 PM      Component Value Range   Glucose-Capillary 113 (*) 70 - 99 mg/dL   Comment 1 Documented in Chart     Comment 2 Notify RN    GLUCOSE, CAPILLARY     Status: Normal   Collection Time   08/16/12  5:26 PM      Component Value Range   Glucose-Capillary 93  70 - 99 mg/dL   Comment 1 Documented in Chart     Comment 2 Notify RN      Micro Results: No results found for this or any previous visit (from the past 240 hour(s)).  Medications:  Scheduled Meds:    . amLODipine  10 mg Oral Daily  . feeding supplement  237 mL Oral BID BM  . heparin  5,000 Units Subcutaneous Q8H  . insulin aspart  0-15 Units Subcutaneous TID WC  . metoCLOPramide (REGLAN) injection  5 mg Intravenous Q6H  . multivitamin with minerals  1 tablet Oral Daily  . pantoprazole  40 mg Oral Daily  . potassium chloride  10 mEq Intravenous Q1 Hr x 2  . potassium chloride  40 mEq Oral BID  . potassium chloride  40 mEq Oral Once  . senna-docusate  2 tablet Oral BID   Continuous Infusions:    . sodium chloride 50 mL/hr at 08/16/12 1030   PRN Meds:.bisacodyl, LORazepam, metoprolol,  oxyCODONE-acetaminophen, promethazine, zolpidem  Assessment/Plan: Hypotension  -resolved now  -Possibly secondary to orthostatic hypotension, fluid boluses as needed, d/c zosyn. Negative procalcitonin and lactic acid levels, repeated blood cultures for sepsis neg workup on 9/24 negative to date  -Hemoglobin stable, cardiac enzymes negative.  -resolved with fluid resuscitation  -concerning for possible liver shock from acute hypotension on 9/24   Altered mental status/decreased responsiveness    -Secondary to above . -resolved now with fluid resuscitation   Nausea and Vomiting  - continue zofran and phenergan IV, added reglan  - ambulate as tolerated  - added protonix  - ? Diabetic Gastroparesis we ordered a gastric emptying study which she could not undergo, so we are starting reglan IV, her nausea and vomiting improved and we will change to po reglan in am.  - GI consulted: HIDA Scan, see orders  - advance diet as tolerated.  Diabetic foot ulcer/ Cellulitis  -Status post I&D per orthopedics last pm patient with no further fevers  -DC'd Zosyn (could the antibiotics have caused this severe n/v?)  -MRI with no evidence of osteomyelitis  -Surgery repacked wound , follow up with surgery outpatient 1 week  - dc'd vanc as no MRSA grew on wound culture   Elevated Liver Enzymes and Nausea and Vomiting  -improved.  -continue IV nausea meds  -acute hepatitis panel negative  -liver US reviewed no sig findings  -suspect shock liver, now enzymes improving  - GI consult appreciated -neg HIDA scan  Hypertension  -monitoring BPs closely, lopressor IV   DM2 (diabetes mellitus, type 2) with hypoglycemic episodes  -BG trending down and some hypoglycemia, decrease Lantus  - no recent hypoglycemia    Normocytic Anemia, multifactorial  - hemoccult of stool negative  - following   Hypokalemia  -repleted orally, following   PT/OT eval recommended SNF placement. CSW working to assist [place] with placement.    LOS: 16 days   Alyscia Carmon 08/16/2012, 7:19 PM  Kathlen Mody, MD,  [facility] [place], [place]  [phone]

## 2012-08-16 NOTE — Progress Notes (Signed)
Referred to this CSW today by MD for SNF. Will ask weekend CSW to assess patient and begin SNF process in hopes of d/c soon- Reece Levy, MSW, [employer] [phone]

## 2012-08-16 NOTE — Progress Notes (Signed)
Physical Therapy Treatment Patient Details Name: Jennifer [name] MRN: [id number] DOB: 1961-05-01 Today's Date: 08/16/2012 Time: 1135-1200 PT Time Calculation (min): 25 min  PT Assessment / Plan / Recommendation Comments on Treatment Session  Pt is slightly better than yesterday, but still symptomatic with orthostasis. She can only stand erect for less than one minute. She needs 24 hour care and assist at D/C and will benefit from continued PT.  Pt is not a candidate for inpatient rehab. She will need STSNF unless family is able to be there for 24 hour assist.    Follow Up Recommendations  Home health PT;Supervision/Assistance - 24 hour;Other (comment) (SNF)    Barriers to Discharge        Equipment Recommendations  Rolling walker with 5" wheels    Recommendations for Other Services OT consult  Frequency Min 3X/week   Plan Discharge plan needs to be updated    Precautions / Restrictions Precautions Precautions: Fall   Pertinent Vitals/Pain Pt with decreased BP to 87/57 in standing    Mobility  Transfers Transfers: Sit to Stand;Stand to Sit Sit to Stand: 4: Min assist Stand to Sit: 4: Min assist Ambulation/Gait Ambulation/Gait Assistance: 4: Min assist Ambulation Distance (Feet): 50 Feet Assistive device: Rolling walker Ambulation/Gait Assistance Details: pt was able to stand upright at first, but began to sink down onto  forearms and stopped, " I have to catch my breath"  Gait Pattern: Step-to pattern;Decreased step length - right;Decreased step length - left;Trunk flexed General Gait Details: Pt not able to tolerate standing erect for more than about a minute Stairs: No    Exercises     PT Diagnosis:    PT Problem List:   PT Treatment Interventions:     PT Goals Acute Rehab PT Goals PT Goal: Sit to Stand - Progress: Progressing toward goal PT Goal: Stand to Sit - Progress: Progressing toward goal PT Goal: Ambulate - Progress: Progressing toward goal  Visit  Information  Last PT Received On: 08/16/12 Assistance Needed: +2    Subjective Data  Subjective: "I guess you want me to get out of my comfort zone" Patient Stated Goal: none stated   Cognition  Overall Cognitive Status: Impaired Area of Impairment: Following commands;Safety/judgement;Awareness of errors;Awareness of deficits Arousal/Alertness: Awake/alert Orientation Level: Appears intact for tasks assessed (pt looks at calendar to answer questions) Behavior During Session: Flat affect (but more interactive than yesterday) Following Commands: Follows one step commands inconsistently Safety/Judgement: Decreased awareness of safety precautions;Decreased safety judgement for tasks assessed;Impulsive;Decreased awareness of need for assistance Safety/Judgement - Other Comments: pt exhibits no carryover with transfer or amb safety;  Awareness of Errors: Assistance required to identify errors made Awareness of Deficits: pt unable to use walker correctly or identify safe techniques; unaware that she needs assitance of 2 people to prevent falls today during amb Cognition - Other Comments: Pt becomes less interactive in standing, likely due to orthostasis    Balance     End of Session PT - End of Session Equipment Utilized During Treatment: Gait belt Activity Tolerance: Patient limited by fatigue;Treatment limited secondary to medical complications (Comment);Other (comment) (othostasis) Nurse Communication: Mobility status   GP    Bayard Hugger. [place], [place] [phone] 08/16/2012, 12:19 PM

## 2012-08-16 NOTE — Progress Notes (Addendum)
Nutrition Follow-up  Intervention: Recommend MD consider enteral nutrition as pt continues to not be meeting nutrition needs orally. Glucerna shake BID. Recommend bowel regimen per MD as pt with last BM documented on 9/25. Will monitor.   Diet Order: Full liquid, ate 20% of breakfast   - Pt had EGD on 9/29 which was normal per MD notes. Noted Ativan reported to help her nausea which pt also reported during visit today. Pt appeared weak and tired. Discussed pt with PT who reported pt with symptomatic orthostasis with pt only being able to stand erect for less than 1 minute. Pt reports eating a little better however it takes more time for her to eat and she has been eating very slowly. Noted pt unable to tolerate gastric emptying study per GI notes. Hepatobiliary scan negative per MD. Last BM charted on 9/25 - pt getting scheduled Senokot and PRN Dulcolax. Noted pt's AST/ALT trending down. Noted pt with episode of hypoglycemia yesterday from inadequate meal intake. CBGs running between 68-144 mg/dL yesterday.    Meds: Scheduled Meds:   . amLODipine  10 mg Oral Daily  . heparin  5,000 Units Subcutaneous Q8H  . insulin aspart  0-15 Units Subcutaneous TID WC  . metoCLOPramide (REGLAN) injection  5 mg Intravenous Q6H  . multivitamin with minerals  1 tablet Oral Daily  . pantoprazole  40 mg Oral Daily  . potassium chloride  10 mEq Intravenous Q1 Hr x 2  . potassium chloride  40 mEq Oral BID  . potassium chloride  40 mEq Oral Once  . senna-docusate  2 tablet Oral BID   Continuous Infusions:   . sodium chloride 50 mL/hr at 08/16/12 1030   PRN Meds:.bisacodyl, LORazepam, metoprolol, oxyCODONE-acetaminophen, promethazine, zolpidem  Labs:  CMP     Component Value Date/Time   NA 142 08/16/2012 0330   K 3.2* 08/16/2012 0330   CL 104 08/16/2012 0330   CO2 26 08/16/2012 0330   GLUCOSE 83 08/16/2012 0330   BUN 13 08/16/2012 0330   CREATININE 0.83 08/16/2012 0330   CALCIUM 9.6 08/16/2012 0330   PROT 7.1  08/16/2012 0330   ALBUMIN 3.0* 08/16/2012 0330   AST 17 08/16/2012 0330   ALT 98* 08/16/2012 0330   ALKPHOS 115 08/16/2012 0330   BILITOT 0.6 08/16/2012 0330   GFRNONAA 81* 08/16/2012 0330   GFRAA >90 08/16/2012 0330   CBG (last 3)   Basename 08/16/12 1219 08/16/12 0800 08/16/12 0457  GLUCAP 113* 112* 80      Intake/Output Summary (Last 24 hours) at 08/16/12 1144 Last data filed at 08/15/12 2103  Gross per 24 hour  Intake    890 ml  Output   1800 ml  Net   -910 ml   Last BM - 9/25  Weight Status:  No new weights  Estimated needs:   1750-1900 calories 70-85g protein  Nutrition Dx: Inadequate oral intake - ongoing  Goal:   1. Resolution of nausea - not met 2. Pt to consume >90% of meals - not met  Monitor: Weights, labs, intake, nausea, BM  Levon Hedger MS, RD, LDN [phone] Pager [phone] After Hours Pager

## 2012-08-16 NOTE — Progress Notes (Signed)
Rehab admissions - Evaluated for possible admission.  Please see rehab consult done by Dr. Riley Kill recommending [facility] follow up.  Not deemed appropriate for acute inpatient rehab stay.  Options will be HH or could consider SNF if needed.  Call me for questions.  #[phone]

## 2012-08-16 NOTE — Progress Notes (Signed)
Subjective: Nausea improving slowly.  Wants to eat a little more. Abdominal pain much better.  Objective: Vital signs in last 24 hours: Temp:  [98.2 F (36.8 C)-98.4 F (36.9 C)] 98.4 F (36.9 C) (10/04 0455) Pulse Rate:  [98-99] 98  (10/04 0455) Resp:  [16-18] 16  (10/04 0455) BP: (145-170)/(74-96) 170/96 mmHg (10/04 0957) SpO2:  [98 %-100 %] 99 % (10/04 0455) Weight change:  Last BM Date: 08/07/12  PE: GEN:  A bit more interactive, non-toxic appearing ABD:  Soft  Lab Results: CBC    Component Value Date/Time   WBC 8.8 08/16/2012 0330   RBC 3.70* 08/16/2012 0330   HGB 11.1* 08/16/2012 0330   HCT 33.3* 08/16/2012 0330   PLT 516* 08/16/2012 0330   MCV 90.0 08/16/2012 0330   MCH 30.0 08/16/2012 0330   MCHC 33.3 08/16/2012 0330   RDW 13.5 08/16/2012 0330   LYMPHSABS 2.3 08/09/2012 0345   MONOABS 1.0 08/09/2012 0345   EOSABS 0.4 08/09/2012 0345   BASOSABS 0.1 08/09/2012 0345   Assessment:  1.  Nausea, vomiting, abdominal pain.  Slowly improving.  No clear etiology, extensive negative evaluation; could not tolerate gastric emptying study.  Plan:  1.  Reglan as needed. 2.  Advance diet. 3.  No further inpatient GI work-up anticipated at this time. 4.  Will sign-off; please call with questions.  Thank you for the consult.   Freddy Jaksch 08/16/2012, 11:28 AM

## 2012-08-17 DIAGNOSIS — I319 Disease of pericardium, unspecified: Secondary | ICD-10-CM

## 2012-08-17 LAB — COMPREHENSIVE METABOLIC PANEL
ALT: 74 U/L — ABNORMAL HIGH (ref 0–35)
Albumin: 3 g/dL — ABNORMAL LOW (ref 3.5–5.2)
Alkaline Phosphatase: 105 U/L (ref 39–117)
Chloride: 102 mEq/L (ref 96–112)
Potassium: 3.4 mEq/L — ABNORMAL LOW (ref 3.5–5.1)
Sodium: 139 mEq/L (ref 135–145)
Total Protein: 6.8 g/dL (ref 6.0–8.3)

## 2012-08-17 LAB — CBC
Hemoglobin: 10.6 g/dL — ABNORMAL LOW (ref 12.0–15.0)
MCHC: 33.3 g/dL (ref 30.0–36.0)
RDW: 13.5 % (ref 11.5–15.5)
WBC: 8.4 10*3/uL (ref 4.0–10.5)

## 2012-08-17 LAB — GLUCOSE, CAPILLARY
Glucose-Capillary: 97 mg/dL (ref 70–99)
Glucose-Capillary: 112 mg/dL — ABNORMAL HIGH (ref 70–99)
Glucose-Capillary: 120 mg/dL — ABNORMAL HIGH (ref 70–99)
Glucose-Capillary: 113 mg/dL — ABNORMAL HIGH (ref 70–99)

## 2012-08-17 MED ORDER — POTASSIUM CHLORIDE CRYS ER 20 MEQ PO TBCR
40.0000 meq | EXTENDED_RELEASE_TABLET | Freq: Two times a day (BID) | ORAL | Status: DC
  Administered 2012-08-17: 40 meq via ORAL
  Filled 2012-08-17 (×2): qty 2

## 2012-08-17 MED ORDER — INFLUENZA VIRUS VACC SPLIT PF IM SUSP
0.5000 mL | Freq: Once | INTRAMUSCULAR | Status: DC
  Filled 2012-08-17: qty 0.5

## 2012-08-17 MED ORDER — PNEUMOCOCCAL VAC POLYVALENT 25 MCG/0.5ML IJ INJ
0.5000 mL | INJECTION | Freq: Once | INTRAMUSCULAR | Status: DC
  Filled 2012-08-17: qty 0.5

## 2012-08-17 NOTE — Progress Notes (Signed)
[facility] progress note. Chief complaint. Fall. History of present illness. This 51 year old female in hospital with hypotension and altered mental status of listed resolved. She has been plagued with nausea and vomiting throughout the hospitalization with no clear etiology. She also has a diabetic foot ulcer with cellulitis. Nursing was helping the patient back from the bathroom when she stated that she had to sit down. She was lowered to the floor by the nurse. There was a change in level of consciousness though no complete loss of consciousness her RN. Other staff were notified and assisted the patient back to bed. Vital signs were obtained and were unremarkable. Blood glucose was obtained and this did not suggest hypoglycemia. I told by nursing that the patient has had some problems with postural hypotension over the course of this hospitalization. The patient herself remembers little from the event. I suspect this may represent syncope or presyncope rather than a simple weakness on the patient's part. I have seen her at the bedside following this event being reported to me. Vital signs. Temperature 98.0, pulse 81, respiration 16, blood pressure 1:30/79. O2 sats 99%. General appearance. Well-developed middle-aged female in no distress. Alert, cooperative, oriented. Cardiac. Rate and rhythm regular. No murmur, S3, S4. Lungs. Breath sounds clear and equal. Abdomen. Soft with positive bowel sounds. Neurologic. Cranial nerves 2 through grossly intact. No unilateral or focal defects. Patient alert and oriented. Impression/plan. Problem #1. Assisted fall. Per nursing description this sounds like syncope or presyncope rather than simply weakness on the part of the patient. I asked for a 12-lead EKG and this shows normal sinus rhythm without evidence of ischemia. There is no telemetry currently thus cardiac status during this event cannot be reviewed. I will place the patient in a telemetry bed. I  suspect this event may have been due to postural hypotension and I have asked staff to obtain postural blood pressures each shift for 3 events. Will check a CBC, CMET, and troponin for 3 sets every 6 hours and will follow for these results. Patient is clinically stable at this time.

## 2012-08-17 NOTE — Progress Notes (Signed)
*  PRELIMINARY RESULTS* Echocardiogram 2D Echocardiogram has been performed.  Jennifer [name] 08/17/2012, 10:15 AM

## 2012-08-17 NOTE — Progress Notes (Signed)
Patient has only voided once this shift. The one void was at 1600 when she got up to the bedside commode. It was a large void at that time but unmeasured. Patient continues to have nausea and vomiting.  Dr. Blake Divine notified and aware. Will continue to monitor patient. Erskin Burnet RN

## 2012-08-17 NOTE — Progress Notes (Signed)
[facility] Progress Note  08/17/2012  HPI  Jennifer [name] is a 51 y.o. female with PMH of DM2 diagnosed 13 years ago, who tried to cut what she thought was a callus off of her foot 2 weeks ago. She thought nothing of it until 2 days ago when her foot and ankle began to swell up. Finally today she noticed drainage through her sock and a very foul smell when she took her sock off. This prompted the patient to present to the ED for treatment. Evaluation in the ED demonstrated a non painful diabetic foot ulcer with purulent drainage and obvious edema of leg and erythema around ulcer. Lab work up showed leukocytosis, X-ray of foot didn't show any obvious osteomylitis, patient started on zosyn hospitalist has been asked to admit for IV antibiotics. Pt had debridement done in OR and subsequently has developed severe nausea and vomiting.  Pt had an episode of hypotension on 9/24 and developed shock liver. She is not able to take anything orally without significant nausea. GI consulted and following. EGD negative. HIDA scan and CT scan negative.    Subjective: Pt still having nausea but ativan did help to improve her symptoms.    Advance diet as tolerated.   Objective:  Vital signs in last 24 hours: Filed Vitals:   08/17/12 1006 08/17/12 1400 08/17/12 1403 08/17/12 1406  BP: 100/79 185/98 128/78 119/71  Pulse: 93 103 104 102  Temp:  98.8 F (37.1 C)    TempSrc:  Oral    Resp: 18 16 18 18   Height:      Weight:      SpO2: 98% 98% 98% 98%   Weight change:   Intake/Output Summary (Last 24 hours) at 08/17/12 1616 Last data filed at 08/17/12 1500  Gross per 24 hour  Intake 1437.33 ml  Output      0 ml  Net 1437.33 ml   Lab Results  Component Value Date   HGBA1C 6.7* 07/31/2012   Lab Results  Component Value Date   CREATININE 0.81 08/17/2012    Review of Systems As above, otherwise all reviewed and reported negative  Physical Exam General - awake, no distress, cooperative  HEENT  - NCAT, MMM Lungs - BBS, CTA  CV - normal s1, s2 sounds  Abd - soft, nondistended, no masses, mild nonspecific tenderness Ext - Right foot dressings clean and dry  Lab Results: Results for orders placed during the hospital encounter of 07/31/12 (from the past 24 hour(s))  GLUCOSE, CAPILLARY     Status: Normal   Collection Time   08/16/12  5:26 PM      Component Value Range   Glucose-Capillary 93  70 - 99 mg/dL   Comment 1 Documented in Chart     Comment 2 Notify RN    GLUCOSE, CAPILLARY     Status: Normal   Collection Time   08/16/12  9:09 PM      Component Value Range   Glucose-Capillary 87  70 - 99 mg/dL   Comment 1 Notify RN    GLUCOSE, CAPILLARY     Status: Normal   Collection Time   08/17/12  3:04 AM      Component Value Range   Glucose-Capillary 97  70 - 99 mg/dL   Comment 1 Notify RN    CBC     Status: Abnormal   Collection Time   08/17/12  4:10 AM      Component Value Range   WBC 8.4  4.0 - 10.5 K/uL   RBC 3.54 (*) 3.87 - 5.11 MIL/uL   Hemoglobin 10.6 (*) 12.0 - 15.0 g/dL   HCT 16.1 (*) 09.6 - 04.5 %   MCV 89.8  78.0 - 100.0 fL   MCH 29.9  26.0 - 34.0 pg   MCHC 33.3  30.0 - 36.0 g/dL   RDW 40.9  81.1 - 91.4 %   Platelets 470 (*) 150 - 400 K/uL  COMPREHENSIVE METABOLIC PANEL     Status: Abnormal   Collection Time   08/17/12  4:10 AM      Component Value Range   Sodium 139  135 - 145 mEq/L   Potassium 3.4 (*) 3.5 - 5.1 mEq/L   Chloride 102  96 - 112 mEq/L   CO2 26  19 - 32 mEq/L   Glucose, Bld 112 (*) 70 - 99 mg/dL   BUN 11  6 - 23 mg/dL   Creatinine, Ser 7.82  0.50 - 1.10 mg/dL   Calcium 9.4  8.4 - 95.6 mg/dL   Total Protein 6.8  6.0 - 8.3 g/dL   Albumin 3.0 (*) 3.5 - 5.2 g/dL   AST 16  0 - 37 U/L   ALT 74 (*) 0 - 35 U/L   Alkaline Phosphatase 105  39 - 117 U/L   Total Bilirubin 0.7  0.3 - 1.2 mg/dL   GFR calc non Af Amer 84 (*) >90 mL/min   GFR calc Af Amer >90  >90 mL/min  TROPONIN I     Status: Normal   Collection Time   08/17/12  4:10 AM       Component Value Range   Troponin I <0.30  <0.30 ng/mL  GLUCOSE, CAPILLARY     Status: Abnormal   Collection Time   08/17/12  5:14 AM      Component Value Range   Glucose-Capillary 112 (*) 70 - 99 mg/dL   Comment 1 Notify RN    GLUCOSE, CAPILLARY     Status: Abnormal   Collection Time   08/17/12  7:47 AM      Component Value Range   Glucose-Capillary 120 (*) 70 - 99 mg/dL   Comment 1 Documented in Chart     Comment 2 Notify RN    TROPONIN I     Status: Normal   Collection Time   08/17/12 10:22 AM      Component Value Range   Troponin I <0.30  <0.30 ng/mL  GLUCOSE, CAPILLARY     Status: Abnormal   Collection Time   08/17/12 12:08 PM      Component Value Range   Glucose-Capillary 127 (*) 70 - 99 mg/dL   Comment 1 Documented in Chart     Comment 2 Notify RN      Micro Results: No results found for this or any previous visit (from the past 240 hour(s)).  Medications:  Scheduled Meds:    . feeding supplement  237 mL Oral BID BM  . heparin  5,000 Units Subcutaneous Q8H  . influenza  inactive virus vaccine  0.5 mL Intramuscular Once  . insulin aspart  0-15 Units Subcutaneous TID WC  . metoCLOPramide (REGLAN) injection  5 mg Intravenous Q6H  . multivitamin with minerals  1 tablet Oral Daily  . pantoprazole  40 mg Oral Daily  . pneumococcal 23 valent vaccine  0.5 mL Intramuscular Once  . potassium chloride  40 mEq Oral Once  . potassium chloride  40 mEq Oral BID  .  senna-docusate  2 tablet Oral BID  . DISCONTD: amLODipine  10 mg Oral Daily   Continuous Infusions:    . sodium chloride 50 mL/hr at 08/17/12 0639   PRN Meds:.bisacodyl, LORazepam, metoprolol, oxyCODONE-acetaminophen, promethazine, zolpidem  Assessment/Plan: Hypotension  -resolved now  -Possibly secondary to orthostatic hypotension, fluid boluses as needed, d/c zosyn. Negative procalcitonin and lactic acid levels, repeated blood cultures for sepsis neg workup on 9/24 negative to date  -Hemoglobin stable,  cardiac enzymes negative.  -resolved with fluid resuscitation  -concerning for possible liver shock from acute hypotension on 9/24   Altered mental status/decreased responsiveness  -Secondary to above . -resolved now with fluid resuscitation   Nausea and Vomiting  - continue zofran and phenergan IV, added reglan  - ambulate as tolerated  - added protonix  - ? Diabetic Gastroparesis we ordered a gastric emptying study which she could not undergo, so we are starting reglan IV, her nausea and vomiting improved and we will change to po reglan in am.  - GI consulted: HIDA Scan, see orders  - advance diet as tolerated.  Diabetic foot ulcer/ Cellulitis  -Status post I&D per orthopedics last pm patient with no further fevers  -DC'd Zosyn (could the antibiotics have caused this severe n/v?)  -MRI with no evidence of osteomyelitis  -Surgery repacked wound , follow up with surgery outpatient 1 week  - dc'd vanc as no MRSA grew on wound culture   Elevated Liver Enzymes and Nausea and Vomiting  -improved.  -continue IV nausea meds  -acute hepatitis panel negative  -liver US reviewed no sig findings  -suspect shock liver, now enzymes improving  - GI consult appreciated -neg HIDA scan  Hypertension  -monitoring BPs closely, lopressor IV   DM2 (diabetes mellitus, type 2) with hypoglycemic episodes  -BG trending down and some hypoglycemia, decrease Lantus  - no recent hypoglycemia    Normocytic Anemia, multifactorial  - hemoccult of stool negative  - following   Hypokalemia  -repleted orally, following    Assisted Fall;  Early this am , patient felt and had a presyncopal episode. On call Physician called and she was transferred to telemetry bed. 12lead EKG was obtained, which showed NSR, troponins were negative. ECHO pending. Will continue to monitor. I believe this is secondary to orthostatic hypotension. We have stopped norvasc.     PT/OT eval recommended SNF placement. CSW  working to assist [place] with placement.    LOS: 17 days   Victor Langenbach 08/17/2012, 4:16 PM  Kathlen Mody, MD,  [facility] [place], [place]  [phone]

## 2012-08-17 NOTE — Progress Notes (Signed)
I was assisting patient back to bed from the bathroom at approximately 3:10 this morning. She became unsteady so I hugged her to myself with both of my arms around her trunk. She became unable to hold herself up and I assisted her to sit on the floor. I called for help and two staff members and myself were able to get the patient up from the floor and back into bed. Vitals taken and stable. No complaints of pain or discomfort. No signs of injury. Notified [employer] covering for [facility]. No new orders at this time.  Will continue to monitor patient closely. She will now be a two assist and use the bedside commode in order to prevent any further falls.

## 2012-08-18 ENCOUNTER — Inpatient Hospital Stay (HOSPITAL_COMMUNITY): Payer: BC Managed Care – PPO

## 2012-08-18 DIAGNOSIS — I951 Orthostatic hypotension: Secondary | ICD-10-CM

## 2012-08-18 LAB — CBC
HCT: 31.1 % — ABNORMAL LOW (ref 36.0–46.0)
MCH: 30 pg (ref 26.0–34.0)
MCHC: 33.1 g/dL (ref 30.0–36.0)
MCV: 90.7 fL (ref 78.0–100.0)
RDW: 13.7 % (ref 11.5–15.5)

## 2012-08-18 LAB — GLUCOSE, CAPILLARY
Glucose-Capillary: 120 mg/dL — ABNORMAL HIGH (ref 70–99)
Glucose-Capillary: 134 mg/dL — ABNORMAL HIGH (ref 70–99)

## 2012-08-18 LAB — BASIC METABOLIC PANEL
BUN: 9 mg/dL (ref 6–23)
Calcium: 9.3 mg/dL (ref 8.4–10.5)
Chloride: 102 mEq/L (ref 96–112)
Creatinine, Ser: 0.76 mg/dL (ref 0.50–1.10)
GFR calc Af Amer: >90 mL/min (ref >90)
GFR calc non Af Amer: >90 mL/min (ref >90)

## 2012-08-18 MED ORDER — POLYETHYLENE GLYCOL 3350 17 G PO PACK
17.0000 g | PACK | Freq: Every day | ORAL | Status: DC | PRN
  Filled 2012-08-18: qty 1

## 2012-08-18 MED ORDER — ONDANSETRON HCL 4 MG/2ML IJ SOLN
4.0000 mg | Freq: Four times a day (QID) | INTRAMUSCULAR | Status: DC | PRN
  Administered 2012-08-18 – 2012-08-21 (×9): 4 mg via INTRAVENOUS
  Filled 2012-08-18 (×10): qty 2

## 2012-08-18 MED ORDER — BISACODYL 10 MG RE SUPP
10.0000 mg | Freq: Every day | RECTAL | Status: DC | PRN
  Administered 2012-08-18: 10 mg via RECTAL
  Filled 2012-08-18: qty 1

## 2012-08-18 MED ORDER — FLUDROCORTISONE ACETATE 0.1 MG PO TABS
0.1000 mg | ORAL_TABLET | Freq: Every day | ORAL | Status: DC
  Administered 2012-08-18 – 2012-08-26 (×4): 0.1 mg via ORAL
  Filled 2012-08-18 (×9): qty 1

## 2012-08-18 MED ORDER — POTASSIUM CHLORIDE 10 MEQ/100ML IV SOLN
10.0000 meq | INTRAVENOUS | Status: AC
  Administered 2012-08-18 (×2): 10 meq via INTRAVENOUS
  Filled 2012-08-18 (×2): qty 100

## 2012-08-18 NOTE — Progress Notes (Signed)
[facility] Progress Note  08/18/2012  HPI  Jennifer [name] is a 51 y.o. female with PMH of DM2 diagnosed 13 years ago, who tried to cut what she thought was a callus off of her foot 2 weeks ago. She thought nothing of it until 2 days ago when her foot and ankle began to swell up. Finally today she noticed drainage through her sock and a very foul smell when she took her sock off. This prompted the patient to present to the ED for treatment. Evaluation in the ED demonstrated a non painful diabetic foot ulcer with purulent drainage and obvious edema of leg and erythema around ulcer. Lab work up showed leukocytosis, X-ray of foot didn't show any obvious osteomylitis, patient started on zosyn hospitalist has been asked to admit for IV antibiotics. Pt had debridement done in OR and subsequently has developed severe nausea and vomiting.  Pt had an episode of hypotension on 9/24 and developed shock liver. She is not able to take anything orally without significant nausea. GI consulted and following. EGD negative. HIDA scan and CT scan negative.    Subjective: Pt still having nausea but ativan did help to improve her symptoms.    Advance diet as tolerated.  She was orthostatic this morning.   Objective:  Vital signs in last 24 hours: Filed Vitals:   08/17/12 2213 08/18/12 0625 08/18/12 0626 08/18/12 1329  BP: 92/67 141/81 101/57 160/98  Pulse: 87 85  96  Temp:  99 F (37.2 C)  98.2 F (36.8 C)  TempSrc:  Axillary  Axillary  Resp: 18 18  16   Height:      Weight:      SpO2: 100% 98%  98%   Weight change:   Intake/Output Summary (Last 24 hours) at 08/18/12 1631 Last data filed at 08/18/12 1500  Gross per 24 hour  Intake    201 ml  Output    500 ml  Net   -299 ml   Lab Results  Component Value Date   HGBA1C 6.7* 07/31/2012   Lab Results  Component Value Date   CREATININE 0.76 08/18/2012    Review of Systems As above, otherwise all reviewed and reported negative  Physical  Exam General - awake, no distress, cooperative  HEENT - NCAT, MMM Lungs - BBS, CTA  CV - normal s1, s2 sounds  Abd - soft, nondistended, no masses, mild nonspecific tenderness Ext - Right foot dressings clean and dry  Lab Results: Results for orders placed during the hospital encounter of 07/31/12 (from the past 24 hour(s))  GLUCOSE, CAPILLARY     Status: Abnormal   Collection Time   08/17/12  5:05 PM      Component Value Range   Glucose-Capillary 103 (*) 70 - 99 mg/dL   Comment 1 Documented in Chart     Comment 2 Notify RN    GLUCOSE, CAPILLARY     Status: Abnormal   Collection Time   08/17/12 10:10 PM      Component Value Range   Glucose-Capillary 113 (*) 70 - 99 mg/dL  GLUCOSE, CAPILLARY     Status: Abnormal   Collection Time   08/18/12  3:18 AM      Component Value Range   Glucose-Capillary 114 (*) 70 - 99 mg/dL  CBC     Status: Abnormal   Collection Time   08/18/12  4:45 AM      Component Value Range   WBC 7.3  4.0 - 10.5 K/uL  RBC 3.43 (*) 3.87 - 5.11 MIL/uL   Hemoglobin 10.3 (*) 12.0 - 15.0 g/dL   HCT 16.1 (*) 09.6 - 04.5 %   MCV 90.7  78.0 - 100.0 fL   MCH 30.0  26.0 - 34.0 pg   MCHC 33.1  30.0 - 36.0 g/dL   RDW 40.9  81.1 - 91.4 %   Platelets 401 (*) 150 - 400 K/uL  BASIC METABOLIC PANEL     Status: Abnormal   Collection Time   08/18/12  4:45 AM      Component Value Range   Sodium 142  135 - 145 mEq/L   Potassium 3.1 (*) 3.5 - 5.1 mEq/L   Chloride 102  96 - 112 mEq/L   CO2 26  19 - 32 mEq/L   Glucose, Bld 121 (*) 70 - 99 mg/dL   BUN 9  6 - 23 mg/dL   Creatinine, Ser 7.82  0.50 - 1.10 mg/dL   Calcium 9.3  8.4 - 95.6 mg/dL   GFR calc non Af Amer >90  >90 mL/min   GFR calc Af Amer >90  >90 mL/min  GLUCOSE, CAPILLARY     Status: Abnormal   Collection Time   08/18/12  6:23 AM      Component Value Range   Glucose-Capillary 120 (*) 70 - 99 mg/dL  GLUCOSE, CAPILLARY     Status: Abnormal   Collection Time   08/18/12  8:03 AM      Component Value Range    Glucose-Capillary 120 (*) 70 - 99 mg/dL  GLUCOSE, CAPILLARY     Status: Abnormal   Collection Time   08/18/12 11:42 AM      Component Value Range   Glucose-Capillary 118 (*) 70 - 99 mg/dL    Micro Results: No results found for this or any previous visit (from the past 240 hour(s)).  Medications:  Scheduled Meds:    . feeding supplement  237 mL Oral BID BM  . fludrocortisone  0.1 mg Oral Daily  . heparin  5,000 Units Subcutaneous Q8H  . influenza  inactive virus vaccine  0.5 mL Intramuscular Once  . insulin aspart  0-15 Units Subcutaneous TID WC  . metoCLOPramide (REGLAN) injection  5 mg Intravenous Q6H  . multivitamin with minerals  1 tablet Oral Daily  . pantoprazole  40 mg Oral Daily  . pneumococcal 23 valent vaccine  0.5 mL Intramuscular Once  . potassium chloride  10 mEq Intravenous Q1 Hr x 2  . senna-docusate  2 tablet Oral BID  . DISCONTD: potassium chloride  40 mEq Oral Once  . DISCONTD: potassium chloride  40 mEq Oral BID   Continuous Infusions:    . sodium chloride 50 mL/hr at 08/17/12 0639   PRN Meds:.bisacodyl, LORazepam, ondansetron (ZOFRAN) IV, polyethylene glycol, promethazine, zolpidem, DISCONTD: bisacodyl, DISCONTD: metoprolol, DISCONTD: oxyCODONE-acetaminophen  Assessment/Plan: Hypotension  -resolved now  -Possibly secondary to orthostatic hypotension, fluid boluses as needed, d/c zosyn. Negative procalcitonin and lactic acid levels, repeated blood cultures for sepsis neg workup on 9/24 negative to date  -Hemoglobin stable, cardiac enzymes negative.  -resolved with fluid resuscitation  -concerning for possible liver shock from acute hypotension on 9/24   Altered mental status/decreased responsiveness  -Secondary to above . -resolved now with fluid resuscitation   Nausea and Vomiting  - continue zofran and phenergan IV, added reglan  - ambulate as tolerated  - added protonix  - ? Diabetic Gastroparesis we ordered a gastric emptying study which she  could not  undergo, so we are starting reglan IV, her nausea and vomiting improved and we will change to po reglan in am.  - GI consulted: HIDA Scan, see orders . We have repeated acute abd series today again to see if she has obstruction, her abd scan is negative for obstruction or constipation.  - advance diet as tolerated.  Diabetic foot ulcer/ Cellulitis  -Status post I&D per orthopedics last pm patient with no further fevers  -DC'd Zosyn (could the antibiotics have caused this severe n/v?)  -MRI with no evidence of osteomyelitis  -Surgery repacked wound , follow up with surgery outpatient 1 week  - dc'd vanc as no MRSA grew on wound culture   Elevated Liver Enzymes and Nausea and Vomiting  -improved.  -continue IV nausea meds  -acute hepatitis panel negative  -liver US reviewed no sig findings  -suspect shock liver, now enzymes improving  - GI consult appreciated -neg HIDA scan  Hypertension  -monitoring BPs closely,.  DM2 (diabetes mellitus, type 2) with hypoglycemic episodes  -BG trending down and some hypoglycemia, decrease Lantus  - no recent hypoglycemia    Normocytic Anemia, multifactorial  - hemoccult of stool negative  - following   Hypokalemia  -repleted orally, following    Assisted Fall;  Early on 10/05, patient felt and had a presyncopal episode. On call Physician called and she was transferred to telemetry bed. 12lead EKG was obtained, which showed NSR, troponins were negative. ECHO pending. Will continue to monitor. I believe this is secondary to orthostatic hypotension. We have stopped norvasc and iv lopressor which was prn, but never given. We have ordered compression stockings. And I have started her on fludrocortisone 0.1mg  daily to see if her orthostatic hypotension improved.      PT/OT eval recommended SNF placement. CSW working to assist [place] with placement.    LOS: 18 days   Jennifer [name] 08/18/2012, 4:31 PM  Kathlen Mody, MD,  [facility] [place], [place]  [phone]

## 2012-08-18 NOTE — Progress Notes (Signed)
Patient continue to C/O nausea/vomiting, PRN and scheduled Reglan and Zofran given, somewhat effective. Very poor PO intake, have problem taking her medications because of N/V.  Dr. Blake Divine informed ordered abd X-ray and prn laxative. Last BM 9/26 PRN dulcolax suppository given with no result. Will continue to assess patient.

## 2012-08-18 NOTE — Progress Notes (Signed)
08-18-12  NSG:  K this am is 3.1.  Pt did not tolerate PO K+ last night very well.  May we have an order to change from PO K+ to IVPB?

## 2012-08-18 NOTE — Progress Notes (Signed)
08-18-12 NSG:  Pt has improved a little from last night.  She has NOT had any emesis but continues to spit into basin - it is clear and about 30 cc's in 5 hours.  She was given a suppository on day shift and had small result from that 2 soft brown balls and mucous from suppository return for me.  She still is nauseated most of the time but Zofran works better for her than the phenergan.  Her urine output for the last 8 hours remains low at 200 cc's.  And she continues to be very Orthostatic - see vital signs.  And is very weak and dizzy upon standing but can stand tonight unlike last night but only for a short while.

## 2012-08-18 NOTE — Progress Notes (Signed)
08-18-12  NSG: Pt has continued to be nauseated this shift and vomiting.  Her emesis is small clear in amount and looks like saliva.  She has been receiving phenergan throughout the shift which has allowed her to rest.  SHE is orthostatic.  At 2200 her lying bp was 171/85 and pulse was 99, her sitting bp was 92/67 c pulse of 97.  We were able tostand her  But pt was  Unable to stand for the required length of tie and needed to lye back down - unable to do standing vs

## 2012-08-18 NOTE — Progress Notes (Signed)
08-18-12 NSG:  Continues to be profoundly orthostatic, please see flowsheet.  Pt unable to stand for required time and therefore unable to do standing part of orthostatics

## 2012-08-19 LAB — GLUCOSE, CAPILLARY
Glucose-Capillary: 97 mg/dL (ref 70–99)
Glucose-Capillary: 117 mg/dL — ABNORMAL HIGH (ref 70–99)

## 2012-08-19 LAB — BASIC METABOLIC PANEL
BUN: 9 mg/dL (ref 6–23)
CO2: 27 mEq/L (ref 19–32)
Chloride: 101 mEq/L (ref 96–112)
Creatinine, Ser: 0.72 mg/dL (ref 0.50–1.10)

## 2012-08-19 NOTE — Progress Notes (Signed)
[facility] Progress Note  08/19/2012  HPI  Jennifer [name] is a 51 y.o. female with PMH of DM2 diagnosed 13 years ago, who tried to cut what she thought was a callus off of her foot 2 weeks ago. She thought nothing of it until 2 days ago when her foot and ankle began to swell up. Finally today she noticed drainage through her sock and a very foul smell when she took her sock off. This prompted the patient to present to the ED for treatment. Evaluation in the ED demonstrated a non painful diabetic foot ulcer with purulent drainage and obvious edema of leg and erythema around ulcer. Lab work up showed leukocytosis, X-ray of foot didn't show any obvious osteomylitis, patient started on zosyn hospitalist has been asked to admit for IV antibiotics. Pt had debridement done in OR and subsequently has developed severe nausea and vomiting.  Pt had an episode of hypotension on 9/24 and developed shock liver. She is not able to take anything orally without significant nausea. GI consulted and following. EGD negative. HIDA scan and CT scan negative.    Subjective: Pt still having nausea but ativan did help to improve her symptoms.    Advance diet as tolerated.  She was orthostatic this morning.   Objective:  Vital signs in last 24 hours: Filed Vitals:    08/19/12 0338 08/19/12 0913 08/19/12 0914 08/19/12 1357  BP: 72/48 170/90 122/75 148/88  Pulse: 98 97 98 99  Temp:    98.4 F (36.9 C)  TempSrc:    Oral  Resp:      Height:      Weight:      SpO2:    98%   Weight change:   Intake/Output Summary (Last 24 hours) at 08/19/12 1527 Last data filed at 08/19/12 1354  Gross per 24 hour  Intake 1317.5 ml  Output   1252 ml  Net   65.5 ml   Lab Results  Component Value Date   HGBA1C 6.7* 07/31/2012   Lab Results  Component Value Date   CREATININE 0.72 08/19/2012    Review of Systems As above, otherwise all reviewed and reported negative  Physical Exam General - awake, no distress,  cooperative  HEENT - NCAT, MMM Lungs - BBS, CTA  CV - normal s1, s2 sounds  Abd - soft, nondistended, no masses, mild nonspecific tenderness Ext - Right foot dressings clean and dry  Lab Results: Results for orders placed during the hospital encounter of 07/31/12 (from the past 24 hour(s))  GLUCOSE, CAPILLARY     Status: Abnormal   Collection Time   08/18/12  4:43 PM      Component Value Range   Glucose-Capillary 107 (*) 70 - 99 mg/dL  GLUCOSE, CAPILLARY     Status: Abnormal   Collection Time   08/18/12 10:56 PM      Component Value Range   Glucose-Capillary 134 (*) 70 - 99 mg/dL  GLUCOSE, CAPILLARY     Status: Abnormal   Collection Time   08/19/12  3:29 AM      Component Value Range   Glucose-Capillary 121 (*) 70 - 99 mg/dL  BASIC METABOLIC PANEL     Status: Abnormal   Collection Time   08/19/12  4:55 AM      Component Value Range   Sodium 140  135 - 145 mEq/L   Potassium 3.5  3.5 - 5.1 mEq/L   Chloride 101  96 - 112 mEq/L   CO2  27  19 - 32 mEq/L   Glucose, Bld 131 (*) 70 - 99 mg/dL   BUN 9  6 - 23 mg/dL   Creatinine, Ser 1.61  0.50 - 1.10 mg/dL   Calcium 9.8  8.4 - 09.6 mg/dL   GFR calc non Af Amer >90  >90 mL/min   GFR calc Af Amer >90  >90 mL/min  GLUCOSE, CAPILLARY     Status: Abnormal   Collection Time   08/19/12  7:26 AM      Component Value Range   Glucose-Capillary 129 (*) 70 - 99 mg/dL  GLUCOSE, CAPILLARY     Status: Normal   Collection Time   08/19/12 11:26 AM      Component Value Range   Glucose-Capillary 97  70 - 99 mg/dL  GLUCOSE, CAPILLARY     Status: Abnormal   Collection Time   08/19/12 11:50 AM      Component Value Range   Glucose-Capillary 117 (*) 70 - 99 mg/dL   Comment 1 Notify RN      Micro Results: No results found for this or any previous visit (from the past 240 hour(s)).  Medications:  Scheduled Meds:    . feeding supplement  237 mL Oral BID BM  . fludrocortisone  0.1 mg Oral Daily  . heparin  5,000 Units Subcutaneous Q8H  .  influenza  inactive virus vaccine  0.5 mL Intramuscular Once  . insulin aspart  0-15 Units Subcutaneous TID WC  . metoCLOPramide (REGLAN) injection  5 mg Intravenous Q6H  . multivitamin with minerals  1 tablet Oral Daily  . pantoprazole  40 mg Oral Daily  . pneumococcal 23 valent vaccine  0.5 mL Intramuscular Once  . senna-docusate  2 tablet Oral BID   Continuous Infusions:    . sodium chloride 50 mL/hr at 08/19/12 0917   PRN Meds:.bisacodyl, LORazepam, ondansetron (ZOFRAN) IV, polyethylene glycol, promethazine, zolpidem  Assessment/Plan: Hypotension  -resolved now  -Possibly secondary to orthostatic hypotension, fluid boluses as needed, d/c zosyn. Negative procalcitonin and lactic acid levels, repeated blood cultures for sepsis neg workup on 9/24 negative to date  -Hemoglobin stable, cardiac enzymes negative.  -resolved with fluid resuscitation  -concerning for possible liver shock from acute hypotension on 9/24   Altered mental status/decreased responsiveness  -Secondary to above . -resolved now with fluid resuscitation   Nausea and Vomiting  - continue zofran and phenergan IV, added reglan  - ambulate as tolerated  - added protonix  - ? Diabetic Gastroparesis we ordered a gastric emptying study which she could not undergo, so we are starting reglan IV, her nausea and vomiting improved and we will change to po reglan in am.  - GI consulted: HIDA Scan, see orders . We have repeated acute abd series today again to see if she has obstruction, her abd scan is negative for obstruction or constipation.  - advance diet as tolerated.  Diabetic foot ulcer/ Cellulitis  -Status post I&D per orthopedics last pm patient with no further fevers  -DC'd Zosyn (could the antibiotics have caused this severe n/v?)  -MRI with no evidence of osteomyelitis  -Surgery repacked wound , follow up with surgery outpatient 1 week  - dc'd vanc as no MRSA grew on wound culture   Elevated Liver Enzymes  and Nausea and Vomiting  -improved.  -continue IV nausea meds  -acute hepatitis panel negative  -liver US reviewed no sig findings  -suspect shock liver, now enzymes improving  - GI consult appreciated -neg  HIDA scan  Hypertension  -monitoring BPs closely,.  DM2 (diabetes mellitus, type 2) with hypoglycemic episodes  -BG trending down and some hypoglycemia, decrease Lantus  - no recent hypoglycemia    Normocytic Anemia, multifactorial  - hemoccult of stool negative  - following   Hypokalemia  -repleted orally, following    Assisted Fall;  Early on 10/05, patient felt and had a presyncopal episode. On call Physician called and she was transferred to telemetry bed. 12lead EKG was obtained, which showed NSR, troponins were negative. ECHO pending. Will continue to monitor. I believe this is secondary to orthostatic hypotension. We have stopped norvasc and iv lopressor which was prn, but never given. We have ordered compression stockings. And I have started her on fludrocortisone 0.1mg  daily to see if her orthostatic hypotension improved.  She appears better today. We will continue with fludrocortisone and fluids.     PT/OT eval recommended SNF placement. CSW working to assist [place] with placement.    LOS: 19 days   Ennio Houp 08/19/2012, 3:27 PM  Kathlen Mody, MD,  [facility] [place], [place]  [phone]

## 2012-08-19 NOTE — Progress Notes (Signed)
Physical Therapy Treatment Patient Details Name: Jennifer [name] MRN: [id number] DOB: 12/14/1960 Today's Date: 08/19/2012 Time: 7829-5621 PT Time Calculation (min): 16 min  PT Assessment / Plan / Recommendation Comments on Treatment Session  Pt continues to present symptomatic with orthostasis and vomited upon sitting back down on bed.  Attempted performing orthostatics with RN in room however BP would not read upon standing and pt began vomitting upon sitting down on bed.  Pt will need 24/7 assist if d/c home, otherwise may need ST-SNF.    Follow Up Recommendations  Home health PT;Supervision/Assistance - 24 hour (if not 24/7 then ST-SNF)     Does the patient have the potential to tolerate intense rehabilitation     Barriers to Discharge        Equipment Recommendations  Rolling walker with 5" wheels    Recommendations for Other Services    Frequency     Plan Discharge plan remains appropriate;Frequency remains appropriate    Precautions / Restrictions Precautions Precautions: Fall Precaution Comments: orthostatic   Pertinent Vitals/Pain No c/o pain Orthostatics:  Supine: 170/90 mmHg HR 97 Sitting: 122/75 mmHg HR 98 Standing: BP would not read, pt dizzy, vomited upon return to sitting    Mobility  Bed Mobility Bed Mobility: Supine to Sit;Sit to Supine Supine to Sit: 5: Supervision Sit to Supine: 3: Mod assist Details for Bed Mobility Assistance: increased time to sit EOB, assist for LEs due to pt fatigued/vomited Transfers Transfers: Sit to Stand;Stand to Sit Sit to Stand: 4: Min assist Stand to Sit: 4: Min assist Details for Transfer Assistance: verbal cues for hand placement, assist to rise and steady, pt reporting increased dizziness, attempted orthostatics however BP would not read upon standing, pt requested sitting back down then vomited Ambulation/Gait Ambulation/Gait Assistance: Not tested (comment) (unable due to orthostatic and vomiting)    Exercises      PT Diagnosis:    PT Problem List:   PT Treatment Interventions:     PT Goals Acute Rehab PT Goals Pt will go Supine/Side to Sit: with modified independence PT Goal: Supine/Side to Sit - Progress: Updated due to goal met PT Goal: Sit to Stand - Progress: Not progressing PT Goal: Stand to Sit - Progress: Not progressing  Visit Information  Last PT Received On: 08/19/12 Assistance Needed: +2    Subjective Data  Subjective: "I'm gonna...(pt vomited)"   Cognition       Balance     End of Session PT - End of Session Activity Tolerance: Other (comment) (orthostatis, vomiting) Patient left: with call bell/phone within reach;in bed   GP     Richardo Popoff,KATHrine E 08/19/2012, 12:08 PM Pager: [phone]

## 2012-08-19 NOTE — Plan of Care (Signed)
Problem: Phase III Progression Outcomes Goal: Activity at appropriate level-compared to baseline (UP IN CHAIR FOR HEMODIALYSIS)  Outcome: Not Progressing Unable to progress due to symptomatic orthostasis, only able to stand less than a minute, vomited upon return to sitting

## 2012-08-20 DIAGNOSIS — R1115 Cyclical vomiting syndrome unrelated to migraine: Secondary | ICD-10-CM

## 2012-08-20 LAB — BASIC METABOLIC PANEL
BUN: 9 mg/dL (ref 6–23)
Calcium: 9.7 mg/dL (ref 8.4–10.5)
GFR calc Af Amer: >90 mL/min (ref >90)
GFR calc non Af Amer: 85 mL/min — ABNORMAL LOW (ref >90)
Potassium: 3.3 mEq/L — ABNORMAL LOW (ref 3.5–5.1)
Sodium: 142 mEq/L (ref 135–145)

## 2012-08-20 LAB — GLUCOSE, CAPILLARY: Glucose-Capillary: 118 mg/dL — ABNORMAL HIGH (ref 70–99)

## 2012-08-20 MED ORDER — MIDODRINE HCL 2.5 MG PO TABS
2.5000 mg | ORAL_TABLET | Freq: Three times a day (TID) | ORAL | Status: DC
  Administered 2012-08-20 – 2012-08-28 (×9): 2.5 mg via ORAL
  Filled 2012-08-20 (×27): qty 1

## 2012-08-20 MED ORDER — HYDROMORPHONE HCL PF 1 MG/ML IJ SOLN
0.5000 mg | INTRAMUSCULAR | Status: AC | PRN
  Administered 2012-08-20 (×2): 0.5 mg via INTRAVENOUS
  Filled 2012-08-20 (×2): qty 1

## 2012-08-20 MED ORDER — POTASSIUM CHLORIDE 10 MEQ/100ML IV SOLN
10.0000 meq | INTRAVENOUS | Status: AC
  Administered 2012-08-20 (×2): 10 meq via INTRAVENOUS
  Filled 2012-08-20 (×2): qty 100

## 2012-08-20 NOTE — Progress Notes (Signed)
Physical Therapy Treatment Patient Details Name: Jennifer [name] MRN: [id number] DOB: [birth year] Today's Date: 08/20/2012 Time: 4098-1191 PT Time Calculation (min): 39 min  PT Assessment / Plan / Recommendation Comments on Treatment Session  Pt continues to present with symptomatic orthostatic hypotension.  Was able to assist pt to 3in1 at bedside then to recliner, however pt very nauseated and dizzy with all upright mobility.  BP lying was 157/94, sitting was 127/73 and just after standing was 85/55.  RN notified.     Follow Up Recommendations  Home health PT;Post acute inpatient;Supervision/Assistance - 24 hour     Does the patient have the potential to tolerate intense rehabilitation  No, Recommend SNF  Barriers to Discharge        Equipment Recommendations  Rolling walker with 5" wheels    Recommendations for Other Services OT consult  Frequency Min 3X/week   Plan Discharge plan remains appropriate;Frequency remains appropriate    Precautions / Restrictions Precautions Precautions: Fall Precaution Comments: orthostatic Restrictions Weight Bearing Restrictions: No   Pertinent Vitals/Pain No pain, continues to be severely symptomatic due to orthostasis    Mobility  Bed Mobility Bed Mobility: Supine to Sit Supine to Sit: 5: Supervision Details for Bed Mobility Assistance: Increased time to sit on EOB and pt immediately becomes dizzy and nauseated.   Transfers Transfers: Sit to Stand;Stand to [employer] Transfers Sit to Stand: 4: Min assist;With upper extremity assist;From bed;With armrests;From chair/3-in-1 Stand to Sit: 4: Min assist;With armrests;To chair/3-in-1 Details for Transfer Assistance: Performed x 2 in order to use 3in1 at bedside with max cues for hand placment and safety.  Pt able to take some steps from bed to 3in1 to recliner, however requires max cues for safety and proper use of RW.  Ambulation/Gait Ambulation/Gait Assistance: Not tested (comment)    Exercises     PT Diagnosis:    PT Problem List:   PT Treatment Interventions:     PT Goals Acute Rehab PT Goals PT Goal Formulation: With patient Time For Goal Achievement: 08/22/12 Potential to Achieve Goals: Good Pt will go Supine/Side to Sit: with modified independence PT Goal: Supine/Side to Sit - Progress: Progressing toward goal Pt will go Sit to Stand: with supervision PT Goal: Sit to Stand - Progress: Progressing toward goal Pt will go Stand to Sit: with supervision PT Goal: Stand to Sit - Progress: Progressing toward goal Pt will Ambulate: 51 - 150 feet;with min assist;with rolling walker PT Goal: Ambulate - Progress: Progressing toward goal  Visit Information  Last PT Received On: 08/20/12 Assistance Needed: +2    Subjective Data  Subjective: I feel awful but I want to try to use the bathroom and get washed up.  Patient Stated Goal: none stated   Cognition  Overall Cognitive Status: Impaired Area of Impairment: Following commands;Safety/judgement Arousal/Alertness: Lethargic Orientation Level: Appears intact for tasks assessed Behavior During Session: Flat affect Following Commands: Follows one step commands inconsistently Safety/Judgement: Decreased awareness of safety precautions;Decreased safety judgement for tasks assessed;Impulsive;Decreased awareness of need for assistance Safety/Judgement - Other Comments: pt exhibits no carryover with transfer or amb safety;  Awareness of Errors: Assistance required to identify errors made Cognition - Other Comments: Pt becomes less interactive in standing, likely due to orthostasis    Balance     End of Session PT - End of Session Activity Tolerance: Other (comment) (Limited due to orthostatics. ) Patient left: with call bell/phone within reach;in bed Nurse Communication: Mobility status;Other (comment) (orthostatic BPs)   GP  Page, Meribeth Mattes 08/20/2012, 5:27 PM

## 2012-08-20 NOTE — Progress Notes (Signed)
Clinical Social Work Department CLINICAL SOCIAL WORK PLACEMENT NOTE 08/20/2012  Patient:  Jennifer [name], Jennifer [name]  Account Number:  [id number] Admit date:  07/31/2012  Clinical Social Worker:  Orpah Greek  Date/time:  08/20/2012 09:34 AM  Clinical Social Work is seeking post-discharge placement for this patient at the following level of care:   SKILLED NURSING   (*CSW will update this form in Epic as items are completed)   08/19/2012  Patient/family provided with [facility] Department of Clinical Social Work's list of facilities offering this level of care within the geographic area requested by the patient (or if unable, by the patient's family).  08/19/2012  Patient/family informed of their freedom to choose among providers that offer the needed level of care, that participate in Medicare, Medicaid or managed care program needed by the patient, have an available bed and are willing to accept the patient.  08/19/2012  Patient/family informed of [facility]' ownership interest in [facility], as well as of the fact that they are under no obligation to receive care at this facility.  PASARR submitted to EDS on 08/19/2012 PASARR number received from EDS on 08/19/2012  FL2 transmitted to all facilities in geographic area requested by pt/family on  08/19/2012 FL2 transmitted to all facilities within larger geographic area on   Patient informed that his/her managed care company has contracts with or will negotiate with  certain facilities, including the following:   BCBS     Patient/family informed of bed offers received:  08/19/2012 Patient chooses bed at [facility] [place] Physician recommends and patient chooses bed at    Patient to be transferred to  on   Patient to be transferred to facility by   The following physician request were entered in Epic:   Additional Comments:  Unice Bailey, LCSW [facility] Clinical Social Worker cell  #: [phone]

## 2012-08-20 NOTE — Progress Notes (Signed)
Clinical Social Work Department BRIEF PSYCHOSOCIAL ASSESSMENT 08/20/2012  Patient:  Jennifer [name], Jennifer [name]     Account Number:  [id number]     Admit date:  07/31/2012  Clinical Social Worker:  Orpah Greek  Date/Time:  08/20/2012 09:27 AM  Referred by:  Physician  Date Referred:  08/20/2012 Referred for  SNF Placement   Other Referral:   Interview type:  Patient Other interview type:   and family via phone    PSYCHOSOCIAL DATA Living Status:  FRIEND(S) Admitted from facility:   Level of care:   Primary support name:  Ayasha Egusquiza (brother) h#: [phone] Primary support relationship to patient:  SIBLING Degree of support available:   fair    CURRENT CONCERNS Current Concerns  Post-Acute Placement   Other Concerns:    SOCIAL WORK ASSESSMENT / PLAN CSW spoke with patient re: discharge planning. Patient states that she lives with her boyfriend but he works 2 jobs and would not be able to provide the 24/7 support PT is recommending at discharge. Patient is agreeable to SNF/Rehab placement, though she states she has never been to/in a SNF before.   Assessment/plan status:  Information/Referral to [facility] Other assessment/ plan:   Information/referral to community resources:   CSW completed FL2 and faxed information out to [facility] - provided bed offers & patient has accepted bed at [facility]. CSW sent clinical information for [employer] authorization.    PATIENT'S/FAMILY'S RESPONSE TO PLAN OF CARE: Patient is agreeable to SNF, awaiting [employer] authorization.        Unice Bailey, LCSW [facility] Clinical Social Worker cell #: [phone]

## 2012-08-20 NOTE — Progress Notes (Signed)
[facility] Progress Note  08/20/2012  HPI  Jennifer [name] is a 51 y.o. female with PMH of DM2 diagnosed 13 years ago, who tried to cut what she thought was a callus off of her foot 2 weeks ago. She thought nothing of it until 2 days ago when her foot and ankle began to swell up. Finally today she noticed drainage through her sock and a very foul smell when she took her sock off. This prompted the patient to present to the ED for treatment. Evaluation in the ED demonstrated a non painful diabetic foot ulcer with purulent drainage and obvious edema of leg and erythema around ulcer. Lab work up showed leukocytosis, X-ray of foot didn't show any obvious osteomylitis, patient started on zosyn hospitalist has been asked to admit for IV antibiotics. Pt had debridement done in OR and subsequently has developed severe nausea and vomiting.  Pt had an episode of hypotension on 9/24 and developed shock liver. She is not able to take anything orally without significant nausea. GI consulted and following. EGD negative. HIDA scan and CT scan negative.  She also has been very orthostatic, we have bolused her and put her on maintenance fluids.Started the patient on fludrocortisone but without any improvement. Her Echo is not significant.   Subjective: Pt still having nausea but ativan did help to improve her symptoms.    Advance diet as tolerated.  She was orthostatic this morning.   Objective:  Vital signs in last 24 hours: Filed Vitals:   08/19/12 1357 08/19/12 2150 08/20/12 0546 08/20/12 1505  BP: 148/88 165/89 165/89 168/90  Pulse: 99 104 100 97  Temp: 98.4 F (36.9 C) 98.8 F (37.1 C) 98.7 F (37.1 C) 98.6 F (37 C)  TempSrc: Oral Oral Oral Oral  Resp:   18 18  Height:      Weight:      SpO2: 98% 98% 98% 100%   Weight change:   Intake/Output Summary (Last 24 hours) at 08/20/12 1949 Last data filed at 08/20/12 1800  Gross per 24 hour  Intake   1270 ml  Output      0 ml  Net   1270 ml    Lab Results  Component Value Date   HGBA1C 6.7* 07/31/2012   Lab Results  Component Value Date   CREATININE 0.80 08/20/2012    Review of Systems As above, otherwise all reviewed and reported negative  Physical Exam General - awake, no distress, cooperative  HEENT - NCAT, MMM Lungs - BBS, CTA  CV - normal s1, s2 sounds  Abd - soft, nondistended, no masses, mild nonspecific tenderness Ext - Right foot dressings clean and dry  Lab Results: Results for orders placed during the hospital encounter of 07/31/12 (from the past 24 hour(s))  BASIC METABOLIC PANEL     Status: Abnormal   Collection Time   08/20/12  4:15 AM      Component Value Range   Sodium 142  135 - 145 mEq/L   Potassium 3.3 (*) 3.5 - 5.1 mEq/L   Chloride 101  96 - 112 mEq/L   CO2 29  19 - 32 mEq/L   Glucose, Bld 138 (*) 70 - 99 mg/dL   BUN 9  6 - 23 mg/dL   Creatinine, Ser 2.13  0.50 - 1.10 mg/dL   Calcium 9.7  8.4 - 08.6 mg/dL   GFR calc non Af Amer 85 (*) >90 mL/min   GFR calc Af Amer >90  >90 mL/min  GLUCOSE, CAPILLARY     Status: Abnormal   Collection Time   08/20/12  7:26 AM      Component Value Range   Glucose-Capillary 147 (*) 70 - 99 mg/dL  GLUCOSE, CAPILLARY     Status: Abnormal   Collection Time   08/20/12 11:29 AM      Component Value Range   Glucose-Capillary 114 (*) 70 - 99 mg/dL  GLUCOSE, CAPILLARY     Status: Abnormal   Collection Time   08/20/12  5:14 PM      Component Value Range   Glucose-Capillary 115 (*) 70 - 99 mg/dL    Micro Results: No results found for this or any previous visit (from the past 240 hour(s)).  Medications:  Scheduled Meds:    . feeding supplement  237 mL Oral BID BM  . fludrocortisone  0.1 mg Oral Daily  . heparin  5,000 Units Subcutaneous Q8H  . influenza  inactive virus vaccine  0.5 mL Intramuscular Once  . insulin aspart  0-15 Units Subcutaneous TID WC  . metoCLOPramide (REGLAN) injection  5 mg Intravenous Q6H  . multivitamin with minerals  1 tablet Oral  Daily  . pantoprazole  40 mg Oral Daily  . pneumococcal 23 valent vaccine  0.5 mL Intramuscular Once  . potassium chloride  10 mEq Intravenous Q1 Hr x 2  . senna-docusate  2 tablet Oral BID   Continuous Infusions:    . sodium chloride 50 mL/hr at 08/20/12 0550   PRN Meds:.bisacodyl, HYDROmorphone (DILAUDID) injection, LORazepam, ondansetron (ZOFRAN) IV, polyethylene glycol, promethazine, zolpidem  Assessment/Plan: Hypotension  -resolved now  -Possibly secondary to orthostatic hypotension, fluid boluses as needed, d/c zosyn. Negative procalcitonin and lactic acid levels, repeated blood cultures for sepsis neg workup on 9/24 negative to date  -Hemoglobin stable, cardiac enzymes negative.  -resolved with fluid resuscitation  -concerning for possible liver shock from acute hypotension on 9/24   Altered mental status/decreased responsiveness  -Secondary to above . -resolved now with fluid resuscitation   Nausea and Vomiting  - continue zofran and phenergan IV, added reglan  - ambulate as tolerated  - added protonix  - ? Diabetic Gastroparesis we ordered a gastric emptying study which she could not undergo, so we are starting reglan IV, her nausea and vomiting improved and we will change to po reglan in am.  - GI consulted: HIDA Scan, EGD all negative. We have repeated acute abd series today again to see if she has obstruction, her abd scan is negative for obstruction or constipation. I have requested Dr . Dulce Sellar today  to assist [place] with controlling her vomiting, Dr Randa Evens will see the patient in am.  - advance diet as tolerated.  Diabetic foot ulcer/ Cellulitis  -Status post I&D per orthopedics last pm patient with no further fevers  -DC'd Zosyn (could the antibiotics have caused this severe n/v?)  -MRI with no evidence of osteomyelitis  -Surgery repacked wound , follow up with surgery outpatient 1 week  - dc'd vanc as no MRSA grew on wound culture   Elevated Liver Enzymes and  Nausea and Vomiting  -improved.  -continue IV nausea meds  -acute hepatitis panel negative  -liver US reviewed no sig findings  -suspect shock liver, now enzymes improving  - GI consult appreciated -neg HIDA scan  Hypertension  -monitoring BPs closely,.  DM2 (diabetes mellitus, type 2) with hypoglycemic episodes  -BG trending down and some hypoglycemia, decrease Lantus  - no recent hypoglycemia    Normocytic  Anemia, multifactorial  - hemoccult of stool negative  - following   Hypokalemia  -repleted orally, following    Assisted Fall;  Early on 10/05, patient felt and had a presyncopal episode. On call Physician called and she was transferred to telemetry bed. 12lead EKG was obtained, which showed NSR, troponins were negative. ECHO pending. Will continue to monitor. I believe this is secondary to orthostatic hypotension. We have stopped norvasc and iv lopressor which was prn, but never given. We have ordered compression stockings. And I have started her on fludrocortisone 0.1mg  daily to see if her orthostatic hypotension improved.  She appears better today. We will continue with fludrocortisone and fluids. Please call cardiology in am for further recommendations.     PT/OT eval recommended SNF placement. CSW working to assist [place] with placement.    LOS: 20 days   Naomie Crow 08/20/2012, 7:49 PM  Kathlen Mody, MD,  [facility] [place], [place]  [phone]

## 2012-08-21 ENCOUNTER — Encounter (HOSPITAL_COMMUNITY): Payer: Self-pay | Admitting: General Surgery

## 2012-08-21 DIAGNOSIS — R112 Nausea with vomiting, unspecified: Secondary | ICD-10-CM

## 2012-08-21 LAB — BASIC METABOLIC PANEL
Calcium: 9.9 mg/dL (ref 8.4–10.5)
Creatinine, Ser: 0.83 mg/dL (ref 0.50–1.10)
GFR calc non Af Amer: 81 mL/min — ABNORMAL LOW (ref >90)
Glucose, Bld: 111 mg/dL — ABNORMAL HIGH (ref 70–99)
Sodium: 145 mEq/L (ref 135–145)

## 2012-08-21 LAB — GLUCOSE, CAPILLARY

## 2012-08-21 MED ORDER — METOCLOPRAMIDE HCL 5 MG/ML IJ SOLN
10.0000 mg | Freq: Four times a day (QID) | INTRAMUSCULAR | Status: DC
  Administered 2012-08-21 – 2012-08-28 (×28): 10 mg via INTRAVENOUS
  Filled 2012-08-21 (×29): qty 2

## 2012-08-21 MED ORDER — PANTOPRAZOLE SODIUM 40 MG IV SOLR
40.0000 mg | Freq: Two times a day (BID) | INTRAVENOUS | Status: DC
  Administered 2012-08-21 – 2012-08-28 (×14): 40 mg via INTRAVENOUS
  Filled 2012-08-21 (×15): qty 40

## 2012-08-21 MED ORDER — ONDANSETRON 8 MG/NS 50 ML IVPB
8.0000 mg | Freq: Four times a day (QID) | INTRAVENOUS | Status: DC | PRN
  Administered 2012-08-25: 8 mg via INTRAVENOUS
  Filled 2012-08-21 (×3): qty 8

## 2012-08-21 NOTE — Progress Notes (Signed)
[facility] PROGRESS NOTE  Jennifer [name]:[id number] DOB: 02/27/1961 DOA: 07/31/2012 PCP: No primary provider on file.  Assessment/Plan: Hypotension  -resolved now  -Possibly secondary to orthostatic hypotension, fluid boluses as needed, d/c zosyn. Negative procalcitonin and lactic acid levels, repeated blood cultures for sepsis neg workup on 9/24 negative to date  -Hemoglobin stable, cardiac enzymes negative.  -resolved with fluid resuscitation  -concerning for possible liver shock from acute hypotension on 9/24  See below  Altered mental status/decreased responsiveness  -Secondary to above .  -resolved now with fluid resuscitation   Nausea and Vomiting  - continue zofran and phenergan IV, added reglan  - ambulate as tolerated  - added protonix  - ? Diabetic Gastroparesis we ordered a gastric emptying study which she could not undergo, so we are starting reglan IV, her nausea and vomiting improved. Have increased her Reglan to 10 mg IV 4 times a day plus increased dose of Zofran - GI consulted: HIDA Scan, see orders . A repeated abdominal series looking for obstruction was negative for back or constipation. again to see if she has obstruction, her abd scan is negative for obstruction or constipation.  - advance diet as tolerated.   Diabetic foot ulcer/ Cellulitis  -Status post I&D per orthopedics last pm patient with no further fevers  -DC'd Zosyn (could the antibiotics have caused this severe n/v?)  -MRI with no evidence of osteomyelitis  -Surgery repacked wound , follow up with surgery outpatient 1 week  - dc'd vanc as no MRSA grew on wound culture  Elevated Liver Enzymes and Nausea and Vomiting  -improved.  -continue IV nausea meds  -acute hepatitis panel negative  -liver US reviewed no sig findings  -suspect shock liver, now enzymes improving  - GI consult appreciated  -neg HIDA scan  Hypertension  -monitoring BPs closely,.  DM2 (diabetes mellitus, type 2) with  hypoglycemic episodes  -BG trending down and some hypoglycemia, decrease Lantus  - no recent hypoglycemia  Normocytic Anemia, multifactorial  - hemoccult of stool negative  - following  Hypokalemia  -repleted orally, following   Near syncope Assisted Fall; Early on 10/05, patient felt and had a presyncopal episode. On call Physician called and she was transferred to telemetry bed. 12lead EKG was obtained, which showed NSR, troponins were negative. ECHO pending. Will continue to monitor. I believe this is secondary to orthostatic hypotension. We have stopped norvasc and iv lopressor which was prn, but never given. We have ordered compression stockings. And I have started her on fludrocortisone 0.1mg  daily to see if her orthostatic hypotension improved. She appears better today. We will continue with fludrocortisone and fluids.    Code Status: Full code  Disposition Plan: Suspect may need short-term skilled nursing, for deconditioning persists   Consultants:  [facility]  General surgery     Antibiotics:  None  HPI/Subjective: Jennifer [name] is a 51 y.o. female with PMH of DM2 diagnosed 13 years ago, who tried to cut what she thought was a callus off of her foot 2 weeks ago. She thought nothing of it until 2 days ago when her foot and ankle began to swell up. Finally today she noticed drainage through her sock and a very foul smell when she took her sock off. This prompted the patient to present to the ED for treatment. Evaluation in the ED demonstrated a non painful diabetic foot ulcer with purulent drainage and obvious edema of leg and erythema around ulcer. Lab work up showed leukocytosis,  X-ray of foot didn't show any obvious osteomylitis, patient started on zosyn hospitalist has been asked to admit for IV antibiotics. Pt had debridement done in OR and subsequently has developed severe nausea and vomiting. Pt had an episode of hypotension on 9/24 and developed shock  liver. She is not able to take anything orally without significant nausea. GI consulted and following. EGD negative. HIDA scan and CT scan negative. She also has been very orthostatic, we have bolused her and put her on maintenance fluids.Started the patient on fludrocortisone but without any improvement. Her Echo is not significant.   Patient is a still having a moderate amount of nausea, but was able to keep a few sips down by this afternoon.   Objective: Filed Vitals:   08/20/12 0546 08/20/12 1505 08/20/12 2203 08/21/12 0511  BP: 165/89 168/90 175/98 173/95  Pulse: 100 97 96 100  Temp: 98.7 F (37.1 C) 98.6 F (37 C) 98.6 F (37 C) 98.6 F (37 C)  TempSrc: Oral Oral Oral Oral  Resp: 18 18 18 18   Height:      Weight:      SpO2: 98% 100% 94% 97%    Intake/Output Summary (Last 24 hours) at 08/21/12 1741 Last data filed at 08/21/12 1500  Gross per 24 hour  Intake   1420 ml  Output   1100 ml  Net    320 ml   Filed Weights   07/31/12 2000 08/01/12 0415  Weight: 70.761 kg (156 lb) 70.9 kg (156 lb 4.9 oz)    Exam:   General:  Looks older than stated age, fatigued, moderate distress secondary to persistent vomiting  Cardiovascular: Regular rate and rhythm, S1-S2  Respiratory: Clear to auscultation bilaterally  Abdomen: Soft, mild distention, hypoactive bowel sounds, nonspecific generalized tenderness  Musculoskeletal l: No clubbing or cyanosis or edema.  Data Reviewed: Basic Metabolic Panel:  Lab 08/21/12 4098 08/20/12 0415 08/19/12 0455 08/18/12 0445 08/17/12 0410  NA 145 142 140 142 139  K 3.4* 3.3* 3.5 3.1* 3.4*  CL 105 101 101 102 102  CO2 28 29 27 26 26   GLUCOSE 111* 138* 131* 121* 112*  BUN 10 9 9 9 11   CREATININE 0.83 0.80 0.72 0.76 0.81  CALCIUM 9.9 9.7 9.8 9.3 9.4  MG -- -- -- -- --  PHOS -- -- -- -- --   Liver Function Tests:  Lab 08/17/12 0410 08/16/12 0330  AST 16 17  ALT 74* 98*  ALKPHOS 105 115  BILITOT 0.7 0.6  PROT 6.8 7.1  ALBUMIN 3.0*  3.0*   CBC:  Lab 08/18/12 0445 08/17/12 0410 08/16/12 0330  WBC 7.3 8.4 8.8  NEUTROABS -- -- --  HGB 10.3* 10.6* 11.1*  HCT 31.1* 31.8* 33.3*  MCV 90.7 89.8 90.0  PLT 401* 470* 516*   Cardiac Enzymes:  Lab 08/17/12 1615 08/17/12 1022 08/17/12 0410  CKTOTAL -- -- --  CKMB -- -- --  CKMBINDEX -- -- --  TROPONINI <0.30 <0.30 <0.30   CBG:  Lab 08/21/12 1134 08/20/12 2202 08/20/12 1714 08/20/12 1129 08/20/12 0726  GLUCAP 121* 118* 115* 114* 147*     Scheduled Meds:   . feeding supplement  237 mL Oral BID BM  . fludrocortisone  0.1 mg Oral Daily  . heparin  5,000 Units Subcutaneous Q8H  . influenza  inactive virus vaccine  0.5 mL Intramuscular Once  . insulin aspart  0-15 Units Subcutaneous TID WC  . metoCLOPramide (REGLAN) injection  10 mg Intravenous Q6H  .  midodrine  2.5 mg Oral TID WC  . multivitamin with minerals  1 tablet Oral Daily  . pantoprazole (PROTONIX) IV  40 mg Intravenous Q12H  . pneumococcal 23 valent vaccine  0.5 mL Intramuscular Once  . senna-docusate  2 tablet Oral BID  . DISCONTD: metoCLOPramide (REGLAN) injection  5 mg Intravenous Q6H  . DISCONTD: pantoprazole  40 mg Oral Daily   Continuous Infusions:   . sodium chloride 50 mL/hr at 08/21/12 0051    Principal Problem:  *Diabetic foot ulcer Active Problems:  DM2 (diabetes mellitus, type 2)  Leukocytosis  Cellulitis  Hypokalemia  Hypotension  Elevated liver enzymes  Nausea & vomiting  Shock liver  Gastroparesis due to DM  Intractable nausea and vomiting  Postoperative nausea and vomiting  Hypertension  Orthostatic hypotension    Time spent: 30 minutes    Hollice Espy  [facility] Pager [phone]. If 8PM-8AM, please contact night-coverage at [email or web address], password [facility] 08/21/2012, 5:41 PM  LOS: 21 days             I will

## 2012-08-21 NOTE — Progress Notes (Signed)
[facility] PROGRESS NOTE Subjective We were asked to see the patient again about her nausea and vomiting. She was admitted a couple weeks ago with an apparent infected diabetic foot ulcer and has been treated. Since coming in the hospital she's had quite a bit of nausea and vomiting and her service saw her. Workup included EGD which was normal, ultrasound showing a slightly thickened gallbladder wall and what appeared to be some sludge without definite stones. This was followed by hepatobiliary scan ruling out acute cholecystitis but ejection fraction was not done CT of the abdomen revealed no explanation for the patient's pain and nausea. 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 Several recommendations were made for gastric emptying scan but due to her continued vomiting it was not felt the procedure would be interpretable. The patient states that she is nauseated all the time but whenever she eats or drinks anything and about 20 minutes she has upper abdominal pain and then vomits. She is somewhat nauseated when she doesn't eat to much worse after eating or drinking.  Objective: Vital signs in last 24 hours: Temp:  [98.6 F (37 C)] 98.6 F (37 C) (10/09 0511) Pulse Rate:  [96-100] 100  (10/09 0511) Resp:  [18] 18  (10/09 0511) BP: (168-175)/(90-98) 173/95 mmHg (10/09 0511) SpO2:  [94 %-100 %] 97 % (10/09 0511) Last BM Date: 08/18/12  Intake/Output from previous day: 10/08 0701 - 10/09 0700 In: 1320 [P.O.:120; I.V.:1200] Out: 800 [Urine:800] Intake/Output this shift:    PE: Gen.-alert female in no distress Abdomen-nondistended and soft with good bowel sounds and generally nontender  Lab Results: No results found for this basename: WBC:5,HGB:5,HCT:5,PLT:5 in the last 72 hours BMET  Basename 08/21/12 0438 08/20/12 0415 08/19/12 0455  NA 145 142 140  K 3.4* 3.3* 3.5  CL 105 101 101  CO2 28 29 27   CREATININE 0.83 0.80 0.72   LFT No results found for this  basename: PROT:3ALBUMIN:3,AST:3,ALT:3,ALKPHOS:3,BILITOT:3,BILIDIR:3,IBILI:3 in the last 72 hours PT/INR No results found for this basename: LABPROT:3,INR:3 in the last 72 hours PANCREAS No results found for this basename: LIPASE:3 in the last 72 hours       Studies/Results: No results found.  Medications: I have reviewed the patient's current medications.  Assessment/Plan: 1.Nausea and Vomiting. This very likely could be all gastroparesis. She is on Reglan and this may or may not helping. She does have some sludge in the gallbladder it may or may not be contributing. This point, we'll continue with Reglan and will continue to see how she does. We will get a surgical opinion about her gallbladder sludge.   Jaquala Fuller JR,Murrell Dome L 08/21/2012, 11:59 AM

## 2012-08-21 NOTE — Consult Note (Signed)
Jennifer [name] July 25, 1961  [id number].   Requesting MD: Dr. Carman [name] Chief Complaint/Reason for Consult: nausea/vomiting HPI: This is a 51 year old female who was admitted to the hospital several weeks ago secondary to diabetic foot ulcer. She had this debrided and further treatment was initiated. Proximally 3 weeks ago the patient developed nausea and vomiting. She had never had this before. She states that as soon as she eats she throws up. Gastroenterology was consulted for further evaluation. She does complain of pain in her chest as well as her epigastric region. She has had a thorough workup for her gallbladder which is all essentially been negative with the exception of possible sludge in the gallbladder. She has had an upper endoscopy which was normal. She states that since her endoscopy she has had worsening pain. [name]  Review of systems: Please see history of present illness otherwise all other systems are currently negative  Family History  Problem Relation Age of Onset  . Diabetes Mother     Past Medical History  Diagnosis Date  . Diabetes mellitus   . Ulcer of lower limb, unspecified     ulcer on top of foot (right)    Past Surgical History  Procedure Date  . Esophagogastroduodenoscopy 08/11/2012    Procedure: ESOPHAGOGASTRODUODENOSCOPY (EGD);  Surgeon: Jennifer Friar, MD;  Location: [facility];  Service: Endoscopy;  Laterality: N/A;  . Tubal ligation     Social History:  reports that she has never smoked. She does not  have any smokeless tobacco history on file. She reports that she does not drink alcohol or use illicit drugs.  Allergies: No Known Allergies  Medications Prior to Admission  Medication Sig Dispense Refill  . insulin glargine (LANTUS) 100 UNIT/ML injection Inject 25 Units into the skin 2 (two) times daily.       Marland Kitchen DISCONTD: ciprofloxacin (CIPRO) 500 MG tablet Take 500 mg by mouth 2 (two) times daily.        Blood pressure 173/95, pulse 100, temperature 98.6 F (37 C), temperature source Oral, resp. rate 18, height 5\' 8"  (1.727 m), weight 156 lb 4.9 oz (70.9 kg), last menstrual period 02/09/2011, SpO2 97.00%. Physical Exam: General: pleasant, black female who is laying in bed in NAD HEENT: head is normocephalic, atraumatic.  Sclera are noninjected.  PERRL.  Ears and nose without any masses or lesions.  Mouth is pink and moist Heart: regular, rate, and rhythm.  Normal s1,s2. No obvious murmurs, gallops, or rubs noted.  Palpable radial and pedal pulses bilaterally Lungs: CTAB, no wheezes, rhonchi, or rales noted.  Respiratory effort nonlabored Abd: soft, minimally tender in epigastrium, ND, +BS, no masses, hernias, or organomegaly MS: all 4 extremities are symmetrical with no cyanosis, clubbing, or edema.  She has a wound noted on her right foot Skin: warm and dry with no masses, lesions, or rashes Psych: A&Ox3 with an appropriate affect.    Results for orders placed during the hospital encounter of 07/31/12 (from the past 48 hour(s))  BASIC METABOLIC PANEL  Status: Abnormal   Collection Time   08/20/12  4:15 AM      Component Value Range Comment   Sodium 142  135 - 145 mEq/L    Potassium 3.3 (*) 3.5 - 5.1 mEq/L    Chloride 101  96 - 112 mEq/L    CO2 29  19 - 32 mEq/L    Glucose, Bld 138 (*) 70 - 99 mg/dL    BUN 9  6 - 23 mg/dL    Creatinine, Ser 1.61  0.50 - 1.10 mg/dL    Calcium 9.7  8.4 - 09.6 mg/dL    GFR calc non Af Amer 85 (*) >90 mL/min    GFR calc Af Amer >90  >90 mL/min     GLUCOSE, CAPILLARY     Status: Abnormal   Collection Time   08/20/12  7:26 AM      Component Value Range Comment   Glucose-Capillary 147 (*) 70 - 99 mg/dL   GLUCOSE, CAPILLARY     Status: Abnormal   Collection Time   08/20/12 11:29 AM      Component Value Range Comment   Glucose-Capillary 114 (*) 70 - 99 mg/dL   GLUCOSE, CAPILLARY     Status: Abnormal   Collection Time   08/20/12  5:14 PM      Component Value Range Comment   Glucose-Capillary 115 (*) 70 - 99 mg/dL   GLUCOSE, CAPILLARY     Status: Abnormal   Collection Time   08/20/12 10:02 PM      Component Value Range Comment   Glucose-Capillary 118 (*) 70 - 99 mg/dL   BASIC METABOLIC PANEL     Status: Abnormal   Collection Time   08/21/12  4:38 AM      Component Value Range Comment   Sodium 145  135 - 145 mEq/L    Potassium 3.4 (*) 3.5 - 5.1 mEq/L    Chloride 105  96 - 112 mEq/L    CO2 28  19 - 32 mEq/L    Glucose, Bld 111 (*) 70 - 99 mg/dL    BUN 10  6 - 23 mg/dL    Creatinine, Ser 0.45  0.50 - 1.10 mg/dL    Calcium 9.9  8.4 - 40.9 mg/dL    GFR calc non Af Amer 81 (*) >90 mL/min    GFR calc Af Amer >90  >90 mL/min   GLUCOSE, CAPILLARY     Status: Abnormal   Collection Time   08/21/12 11:34 AM      Component Value Range Comment   Glucose-Capillary 121 (*) 70 - 99 mg/dL    No results found.     Assessment/Plan 1. Nausea/vomiting 2. Diabetes mellitus 3. Gallbladder sludge 4. Diabetic foot ulcer  Plan: We've been asked to evaluate the patient to determine whether she would benefit from a cholecystectomy. The patient's symptoms do not sound consistent with gallbladder disease. She states that as soon as she eats she gets a burning sensation in her stomach and throws up. She also states the most of the pain is located in her chest and at this pain only started after her upper endoscopy. Jennifer [name] states that her nausea and vomiting is only been present since her hospitalization. Typically with gallbladder disease people will  be and then within several hours developed abdominal pain nausea and vomiting which is not this patient's case. It sounds like the patient may have some gastroparesis secondary to her diabetes however a gastric emptying study was unable  to be completed.   I think we would be somewhat hesitant to initially proceed with surgical intervention when we are unable to rule out a nonsurgical problem as the source of her symptoms. I will discuss this patient with Dr. Ezzard Standing and he will get his recommendations after his evaluation of the patient. Thank you for this consultation. We will follow along with you.  OSBORNE,KELLY E 08/21/2012, 2:25 PM Pager: [phone]  Agree with above.  Ovidio Kin, MD, [facility] Pager: [phone] Office phone:  [phone]

## 2012-08-22 LAB — BASIC METABOLIC PANEL
BUN: 9 mg/dL (ref 6–23)
CO2: 27 mEq/L (ref 19–32)
GFR calc non Af Amer: 83 mL/min — ABNORMAL LOW (ref >90)
Glucose, Bld: 125 mg/dL — ABNORMAL HIGH (ref 70–99)
Potassium: 3.3 mEq/L — ABNORMAL LOW (ref 3.5–5.1)

## 2012-08-22 LAB — GLUCOSE, CAPILLARY: Glucose-Capillary: 115 mg/dL — ABNORMAL HIGH (ref 70–99)

## 2012-08-22 MED ORDER — BOOST / RESOURCE BREEZE PO LIQD: 1.0000 | Freq: Two times a day (BID) | ORAL | Status: DC

## 2012-08-22 MED ORDER — ALUM & MAG HYDROXIDE-SIMETH 200-200-20 MG/5ML PO SUSP
30.0000 mL | ORAL | Status: DC | PRN
  Administered 2012-08-23: 30 mL via ORAL
  Filled 2012-08-22 (×2): qty 30

## 2012-08-22 MED ORDER — HYDRALAZINE HCL 20 MG/ML IJ SOLN
5.0000 mg | INTRAMUSCULAR | Status: DC | PRN
  Administered 2012-08-22 – 2012-08-24 (×3): 5 mg via INTRAVENOUS
  Filled 2012-08-22 (×2): qty 1

## 2012-08-22 MED ORDER — ERYTHROMYCIN LACTOBIONATE 500 MG IV SOLR
250.0000 mg | Freq: Four times a day (QID) | INTRAVENOUS | Status: DC
  Administered 2012-08-22 – 2012-08-28 (×24): 250 mg via INTRAVENOUS
  Filled 2012-08-22 (×25): qty 250

## 2012-08-22 MED ORDER — HYDRALAZINE HCL 20 MG/ML IJ SOLN
INTRAMUSCULAR | Status: AC
  Administered 2012-08-22: 5 mg via INTRAVENOUS
  Filled 2012-08-22: qty 1

## 2012-08-22 NOTE — Progress Notes (Addendum)
Changed dressing on R foot per Doctor order. Will continue to monitor per Doctor order and unit protocol. IV team in to assess for restart.   Patient able to tolerate small sips of crangrape juice. Will continue to monitor for N/V.

## 2012-08-22 NOTE — Progress Notes (Signed)
Physical Therapy Treatment Patient Details Name: Jennifer [name] MRN: [id number] DOB: Nov 15, 1960 Today's Date: 08/22/2012 Time: 4098-1191 PT Time Calculation (min): 20 min  PT Assessment / Plan / Recommendation Comments on Treatment Session  Pt requires MAX encouragement to participate and appears self limiting. MAX c/o nausea despite IV meds given just prior. Assisted pt OOB to amb in hallway limited distance. BP supine 164/92, EOB 115/75.    Follow Up Recommendations   (skilled nursing)     Does the patient have the potential to tolerate intense rehabilitation     Barriers to Discharge        Equipment Recommendations  Rolling walker with 5" wheels    Recommendations for Other Services    Frequency Min 3X/week   Plan Discharge plan remains appropriate    Precautions / Restrictions Precautions Precautions: Fall    Pertinent Vitals/Pain C/o nausea    Mobility  Bed Mobility Bed Mobility: Supine to Sit;Sitting - Scoot to Edge of Bed Supine to Sit: 5: Supervision Sitting - Scoot to [place] of Bed: 5: Supervision Details for Bed Mobility Assistance: Increased time w/ MAX c/o nausea despite IV meds given for nausea  Transfers Transfers: Sit to Stand;Stand to Sit Sit to Stand: 4: Min assist;From bed Stand to Sit: 4: Min assist;To bed Details for Transfer Assistance: Increased time, nearly 4 min for pt to initiate task.  MAX encouragement to paticipate. Pt self limiting. Shows poor motivation.  Ambulation/Gait Ambulation/Gait Assistance: 4: Min assist Ambulation Distance (Feet): 50 Feet Assistive device: Rolling walker Ambulation/Gait Assistance Details: pt was able to stand upright at first, but began to sink down onto forearms and stopped, " I have to catch my breath" .  Pt demon limited activity tolerance and poor motivation. Gait Pattern: Step-through pattern;Trunk flexed     PT Goals   Progressing slowly    Visit Information  Last PT Received On:  08/22/12 Assistance Needed: +1                   End of Session PT - End of Session Equipment Utilized During Treatment: Gait belt Activity Tolerance: Patient limited by fatigue Patient left: in bed;with call bell/phone within reach Nurse Communication: Mobility status (RN observed)   Felecia Shelling  PTA [facility]  Acute  Rehab Pager     [phone]

## 2012-08-22 NOTE — Progress Notes (Signed)
[facility] PROGRESS NOTE Subjective Surgery does not feel that the patient's symptoms are due to gallbladder. She still continues with nausea and vomiting. She is constantly nauseated but that she eats or not. IV Reglan does help her nausea but makes her very sleepy and the nausea returns as soon as she wakes up and she didn't tends to vomit.   Objective: Vital signs in last 24 hours: Temp:  [98.4 F (36.9 C)-99.5 F (37.5 C)] 98.4 F (36.9 C) (10/10 0500) Pulse Rate:  [101-102] 101  (10/10 0500) Resp:  [18] 18  (10/10 0500) BP: (156-170)/(89-92) 156/89 mmHg (10/10 0500) SpO2:  [98 %-99 %] 99 % (10/10 0500) Last BM Date: 08/18/12  Intake/Output from previous day: 10/09 0701 - 10/10 0700 In: 1460 [P.O.:460; I.V.:1000] Out: 2500 [Urine:2500] Intake/Output this shift:    PE: Abdomen-soft and nontender  Lab Results: No results found for this basename: WBC:5,HGB:5,HCT:5,PLT:5 in the last 72 hours BMET  Basename 08/22/12 0358 08/21/12 0438 08/20/12 0415  NA 141 145 142  K 3.3* 3.4* 3.3*  CL 101 105 101  CO2 27 28 29   CREATININE 0.82 0.83 0.80   LFT No results found for this basename: PROT:3ALBUMIN:3,AST:3,ALT:3,ALKPHOS:3,BILITOT:3,BILIDIR:3,IBILI:3 in the last 72 hours PT/INR No results found for this basename: LABPROT:3,INR:3 in the last 72 hours PANCREAS No results found for this basename: LIPASE:3 in the last 72 hours       Studies/Results: No results found.  Medications: I have reviewed the patient's current medications.  Assessment/Plan: 1. Nausea and vomiting. Almost certainly due to diabetic gastroparesis. Patient is upset that nothing is being done to help her nausea and vomiting. Explained to her that this is likely to be a chronic problem. She is currently receiving both IV metoclopramide and IV erythromycin and continues to have symptoms in spite of this. In concern that her nutrition may be adversely affecting her wound healing. If not  improved we may have to have radiology place a temporary nasoduodenal feeding tube.   Shannan Garfinkel JR,Traquan Duarte L 08/22/2012, 10:32 AM

## 2012-08-22 NOTE — Progress Notes (Signed)
Nutrition Follow-up  Intervention:  1. Recommend re-weigh patient.  2. Will discontinue Glucerna nutrition supplement and order patient Resource Breeze nutrition supplement BID, provides 500 kcal and 18 grams of protein daily.  3. RD agrees with gastroenterology for temporary nasoduodenal feeding tube placement. Patient has had prolonged poor PO intake.  4. RD to follow for nutrition plan of care.   Assessment:   Patient's diet has been advanced from full liquid to carb modified. She reported her appetite is not good. She reported she has nausea and foods make her sick. She reported she is able to tolerate fruit and chicken broth. Patient PO intake remains poor, documented 10% at meals. She reported she is not drinking the Glucerna nutrition supplement because they cause diarrhea. Noted patient with diabetic foot ulcer. Noted per gastroenterology note there is a concern that her poor nutrition intake may be adversely affecting her wound healing and if nutrition intake remains unimproved a temporary nasoduodenal feeding tube mat need to be placed.   Diet Order:  Carb Modified  Meds: Scheduled Meds:   . erythromycin  250 mg Intravenous Q6H  . feeding supplement  237 mL Oral BID BM  . fludrocortisone  0.1 mg Oral Daily  . heparin  5,000 Units Subcutaneous Q8H  . influenza  inactive virus vaccine  0.5 mL Intramuscular Once  . insulin aspart  0-15 Units Subcutaneous TID WC  . metoCLOPramide (REGLAN) injection  10 mg Intravenous Q6H  . midodrine  2.5 mg Oral TID WC  . multivitamin with minerals  1 tablet Oral Daily  . pantoprazole (PROTONIX) IV  40 mg Intravenous Q12H  . pneumococcal 23 valent vaccine  0.5 mL Intramuscular Once  . senna-docusate  2 tablet Oral BID  . DISCONTD: metoCLOPramide (REGLAN) injection  5 mg Intravenous Q6H  . DISCONTD: pantoprazole  40 mg Oral Daily   Continuous Infusions:   . sodium chloride 50 mL/hr at 08/21/12 2008   PRN Meds:.bisacodyl, LORazepam, ondansetron  (ZOFRAN) IV, polyethylene glycol, promethazine, zolpidem, DISCONTD: ondansetron (ZOFRAN) IV  Labs:  CMP     Component Value Date/Time   NA 141 08/22/2012 0358   K 3.3* 08/22/2012 0358   CL 101 08/22/2012 0358   CO2 27 08/22/2012 0358   GLUCOSE 125* 08/22/2012 0358   BUN 9 08/22/2012 0358   CREATININE 0.82 08/22/2012 0358   CALCIUM 9.8 08/22/2012 0358   PROT 6.8 08/17/2012 0410   ALBUMIN 3.0* 08/17/2012 0410   AST 16 08/17/2012 0410   ALT 74* 08/17/2012 0410   ALKPHOS 105 08/17/2012 0410   BILITOT 0.7 08/17/2012 0410   GFRNONAA 83* 08/22/2012 0358   GFRAA >90 08/22/2012 0358     Intake/Output Summary (Last 24 hours) at 08/22/12 1113 Last data filed at 08/22/12 0700  Gross per 24 hour  Intake   1460 ml  Output   2500 ml  Net  -1040 ml    Weight Status:  No new weights since 9/19.  *Recommend re-weight patient.   Re-estimated needs remain the same:  1750-1900 kcal and 70-85 grams protein  Nutrition Dx: Inadequate Oral Intake, Ongoing.   Goal:  1. Pt to consume > 90% of meals. -Not meeting.   Monitor:  Weights, PO intake, nausea, labs   Adron Bene [phone]

## 2012-08-22 NOTE — Progress Notes (Signed)
[facility] PROGRESS NOTE  Jennifer [name] ZOX:[id number] DOB: 1961/01/09 DOA: 07/31/2012 PCP: No primary provider on file.  Assessment/Plan: Hypotension  -resolved now  -Possibly secondary to orthostatic hypotension, fluid boluses as needed, d/c zosyn. Negative procalcitonin and lactic acid levels, repeated blood cultures for sepsis neg workup on 9/24 negative to date  -Hemoglobin stable, cardiac enzymes negative.  -resolved with fluid resuscitation  -concerning for possible liver shock from acute hypotension on 9/24  See below  Altered mental status/decreased responsiveness  -Secondary to above .  -resolved now with fluid resuscitation   Nausea and Vomiting  - continue zofran and phenergan IV, added reglan  - ambulate as tolerated  - added protonix  - ? Diabetic Gastroparesis we ordered a gastric emptying study which she could not undergo, so we are starting reglan IV, her nausea and vomiting improved. Have increased her Reglan to 10 mg IV 4 times a day plus increased dose of Zofran. She had some minimal improvement from that so have added IV erythromycin. - GI consulted: HIDA Scan, see orders . A repeated abdominal series looking for obstruction was negative for back or constipation. again to see if she has obstruction, her abd scan is negative for obstruction or constipation.  - advance diet as tolerated.   Diabetic foot ulcer/ Cellulitis  -Status post I&D per orthopedics last pm patient with no further fevers  -DC'd Zosyn (could the antibiotics have caused this severe n/v?)  -MRI with no evidence of osteomyelitis  -Surgery repacked wound , follow up with surgery outpatient 1 week  - dc'd vanc as no MRSA grew on wound culture if she still has poor intake by tomorrow, will add NG tube and start tube feedings to improve wound care healing Elevated Liver Enzymes and Nausea and Vomiting  -improved.  -continue IV nausea meds  -acute hepatitis panel negative  -liver US reviewed  no sig findings  -suspect shock liver, now enzymes improving  - GI consult appreciated  -neg HIDA scan  Hypertension  -monitoring BPs closely,.  DM2 (diabetes mellitus, type 2) with hypoglycemic episodes  -BG trending down and some hypoglycemia, decrease Lantus  - no recent hypoglycemia  Normocytic Anemia, multifactorial  - hemoccult of stool negative  - following  Hypokalemia  -repleted orally, following   Near syncope Assisted Fall; Early on 10/05, patient felt and had a presyncopal episode. On call Physician called and she was transferred to telemetry bed. 12lead EKG was obtained, which showed NSR, troponins were negative. ECHO pending. Will continue to monitor. I believe this is secondary to orthostatic hypotension. We have stopped norvasc and iv lopressor which was prn, but never given. We have ordered compression stockings. And I have started her on fludrocortisone 0.1mg  daily to see if her orthostatic hypotension improved. She appears better today. We will continue with fludrocortisone and fluids.    Code Status: Full code  Disposition Plan: Will need short-term skilled nursing, for deconditioning persists   Consultants:  [facility]  General surgery-signed off today 10/10     Antibiotics:  None  HPI/Subjective: Jennifer [name] is a 51 y.o. female with PMH of DM2 diagnosed 13 years ago, who tried to cut what she thought was a callus off of her foot 2 weeks ago. She thought nothing of it until 2 days ago when her foot and ankle began to swell up. Finally today she noticed drainage through her sock and a very foul smell when she took her sock off. This prompted the patient to  present to the ED for treatment. Evaluation in the ED demonstrated a non painful diabetic foot ulcer with purulent drainage and obvious edema of leg and erythema around ulcer. Lab work up showed leukocytosis, X-ray of foot didn't show any obvious osteomylitis, patient started on zosyn  hospitalist has been asked to admit for IV antibiotics. Pt had debridement done in OR and subsequently has developed severe nausea and vomiting. Pt had an episode of hypotension on 9/24 and developed shock liver. She is not able to take anything orally without significant nausea. GI consulted and following. EGD negative. HIDA scan and CT scan negative. She also has been very orthostatic, we have bolused her and put her on maintenance fluids.Started the patient on fludrocortisone but without any improvement. Her Echo is not significant.   Patient is a still having a moderate amount of nausea, but was able to keep a few sips down by this afternoon.   Objective: Filed Vitals:   08/21/12 0511 08/21/12 2104 08/22/12 0500 08/22/12 1419  BP: 173/95 170/92 156/89 120/79  Pulse: 100 102 101 107  Temp: 98.6 F (37 C) 99.5 F (37.5 C) 98.4 F (36.9 C) 98.8 F (37.1 C)  TempSrc: Oral Oral Oral Oral  Resp: 18 18 18 18   Height:      Weight:      SpO2: 97% 98% 99% 99%    Intake/Output Summary (Last 24 hours) at 08/22/12 1722 Last data filed at 08/22/12 1500  Gross per 24 hour  Intake    840 ml  Output   3000 ml  Net  -2160 ml   Filed Weights   07/31/12 2000 08/01/12 0415  Weight: 70.761 kg (156 lb) 70.9 kg (156 lb 4.9 oz)    Exam:   General:  Looks older than stated age, fatigued, moderate distress secondary to persistent vomiting  Cardiovascular: Regular rate and rhythm, S1-S2  Respiratory: Clear to auscultation bilaterally  Abdomen: Soft, mild distention, hypoactive bowel sounds, nonspecific generalized tenderness  Musculoskeletal l: No clubbing or cyanosis or edema.  Data Reviewed: Basic Metabolic Panel:  Lab 08/22/12 6962 08/21/12 0438 08/20/12 0415 08/19/12 0455 08/18/12 0445  NA 141 145 142 140 142  K 3.3* 3.4* 3.3* 3.5 3.1*  CL 101 105 101 101 102  CO2 27 28 29 27 26   GLUCOSE 125* 111* 138* 131* 121*  BUN 9 10 9 9 9   CREATININE 0.82 0.83 0.80 0.72 0.76  CALCIUM 9.8  9.9 9.7 9.8 9.3  MG -- -- -- -- --  PHOS -- -- -- -- --   Liver Function Tests:  Lab 08/17/12 0410 08/16/12 0330  AST 16 17  ALT 74* 98*  ALKPHOS 105 115  BILITOT 0.7 0.6  PROT 6.8 7.1  ALBUMIN 3.0* 3.0*   CBC:  Lab 08/18/12 0445 08/17/12 0410 08/16/12 0330  WBC 7.3 8.4 8.8  NEUTROABS -- -- --  HGB 10.3* 10.6* 11.1*  HCT 31.1* 31.8* 33.3*  MCV 90.7 89.8 90.0  PLT 401* 470* 516*   Cardiac Enzymes:  Lab 08/17/12 1615 08/17/12 1022 08/17/12 0410  CKTOTAL -- -- --  CKMB -- -- --  CKMBINDEX -- -- --  TROPONINI <0.30 <0.30 <0.30   CBG:  Lab 08/22/12 1141 08/22/12 0811 08/22/12 0316 08/21/12 2048 08/21/12 1650  GLUCAP 134* 142* 115* 108* 99     Scheduled Meds:    . erythromycin  250 mg Intravenous Q6H  . feeding supplement  1 Container Oral BID BM  . fludrocortisone  0.1 mg Oral  Daily  . heparin  5,000 Units Subcutaneous Q8H  . influenza  inactive virus vaccine  0.5 mL Intramuscular Once  . insulin aspart  0-15 Units Subcutaneous TID WC  . metoCLOPramide (REGLAN) injection  10 mg Intravenous Q6H  . midodrine  2.5 mg Oral TID WC  . multivitamin with minerals  1 tablet Oral Daily  . pantoprazole (PROTONIX) IV  40 mg Intravenous Q12H  . pneumococcal 23 valent vaccine  0.5 mL Intramuscular Once  . senna-docusate  2 tablet Oral BID  . DISCONTD: feeding supplement  237 mL Oral BID BM   Continuous Infusions:    . sodium chloride 50 mL/hr at 08/21/12 2008    Principal Problem:  *Diabetic foot ulcer Active Problems:  DM2 (diabetes mellitus, type 2)  Leukocytosis  Cellulitis  Hypokalemia  Hypotension  Elevated liver enzymes  Nausea & vomiting  Shock liver  Gastroparesis due to DM  Intractable nausea and vomiting  Postoperative nausea and vomiting  Hypertension  Orthostatic hypotension    Time spent: 25 minutes    Hollice Espy  [facility] Pager [phone]. If 8PM-8AM, please contact night-coverage at [email or web address], password  [facility] 08/22/2012, 5:22 PM  LOS: 22 days             I will

## 2012-08-22 NOTE — Progress Notes (Signed)
Pt is refusing NGT placement for nutrition.

## 2012-08-22 NOTE — Progress Notes (Signed)
CSW following for discharge to [facility] when medically stable. [employer] authorization obtained. CSW has completed FL2 & will continue to follow and assist with discharge when ready.    Unice Bailey, LCSW [facility] Clinical Social Worker cell #: [phone]

## 2012-08-22 NOTE — Progress Notes (Signed)
Patient ID: Jennifer [name], female   DOB: [birth year], 51 y.o.   MRN: [id number] 11 Days Post-Op  Subjective: Pt still with some nausea, but improved with reglan.  Ate some fruit, but feels like it's stuck.  C/o heartburn.  Objective: Vital signs in last 24 hours: Temp:  [98.4 F (36.9 C)-99.5 F (37.5 C)] 98.4 F (36.9 C) (10/10 0500) Pulse Rate:  [101-102] 101  (10/10 0500) Resp:  [18] 18  (10/10 0500) BP: (156-170)/(89-92) 156/89 mmHg (10/10 0500) SpO2:  [98 %-99 %] 99 % (10/10 0500) Last BM Date: 08/18/12  Intake/Output from previous day: 10/09 0701 - 10/10 0700 In: 1460 [P.O.:460; I.V.:1000] Out: 2500 [Urine:2500] Intake/Output this shift: Total I/O In: -  Out: 800 [Urine:800]  PE: Abd: soft, minimally tender in epigastrium, +BS, ND  Lab Results:  No results found for this basename: WBC:2,HGB:2,HCT:2,PLT:2 in the last 72 hours BMET  [facility] 08/22/12 0358 08/21/12 0438  NA 141 145  K 3.3* 3.4*  CL 101 105  CO2 27 28  GLUCOSE 125* 111*  BUN 9 10  CREATININE 0.82 0.83  CALCIUM 9.8 9.9   PT/INR No results found for this basename: LABPROT:2,INR:2 in the last 72 hours CMP     Component Value Date/Time   NA 141 08/22/2012 0358   K 3.3* 08/22/2012 0358   CL 101 08/22/2012 0358   CO2 27 08/22/2012 0358   GLUCOSE 125* 08/22/2012 0358   BUN 9 08/22/2012 0358   CREATININE 0.82 08/22/2012 0358   CALCIUM 9.8 08/22/2012 0358   PROT 6.8 08/17/2012 0410   ALBUMIN 3.0* 08/17/2012 0410   AST 16 08/17/2012 0410   ALT 74* 08/17/2012 0410   ALKPHOS 105 08/17/2012 0410   BILITOT 0.7 08/17/2012 0410   GFRNONAA 83* 08/22/2012 0358   GFRAA >90 08/22/2012 0358   Lipase     Component Value Date/Time   LIPASE 18 10/13/2009 1645    Studies/Results: No results found.  Anti-infectives: Anti-infectives     Start     Dose/Rate Route Frequency Ordered Stop   08/22/12 1200   erythromycin 250 mg in sodium chloride 0.9 % 100 mL IVPB        250 mg 100 mL/hr over 60 Minutes  Intravenous 4 times per day 08/22/12 0955     08/08/12 1200   vancomycin (VANCOCIN) IVPB 1000 mg/200 mL premix  Status:  Discontinued        1,000 mg 200 mL/hr over 60 Minutes Intravenous Every 24 hours 08/08/12 0035 08/09/12 1341   08/02/12 1200   vancomycin (VANCOCIN) IVPB 1000 mg/200 mL premix  Status:  Discontinued        1,000 mg 200 mL/hr over 60 Minutes Intravenous Every 12 hours 08/02/12 1032 08/08/12 0035   08/01/12 0600   piperacillin-tazobactam (ZOSYN) IVPB 3.375 g  Status:  Discontinued        3.375 g 12.5 mL/hr over 240 Minutes Intravenous 3 times per day 08/01/12 0207 08/10/12 1204   07/31/12 2245  piperacillin-tazobactam (ZOSYN) IVPB 3.375 g       3.375 g 100 mL/hr over 30 Minutes Intravenous  Once 07/31/12 2232 07/31/12 2328         Assessment/Plan  1. Nausea/vomiting, likely secondary to diabetic gastroparesis 2. DM 3. Diabetic foot ulcer  Plan: 1. Still suspect this is related to gastroparesis, but will have Dr. Ezzard Standing evaluate and get his opinion.  If reglan is helping, would recommend continuing this along with the IV erythromycin.   LOS: 22 days  OSBORNE,KELLY E 08/22/2012, 1:24 PM Pager: [phone]  Hepatobiliary scan - 08/12/2012 - prompt gall bladder visualization CT - 08/11/2012 - no surgical issues, ? left UPJ obstruction US - 08/08/2012 - sludge (though not very impressive)  The patient has had persistent nausea with vomiting since soon after admission for a left diabetic foot ulcer.  She said that prior to this admission, she has never had this before.  The nausea is precipitated by any food or smell.  Her symptoms do not match gall bladder disease and I would not recommend cholecystectomy with the current data that I have.  I suspect that her nausea has a component of gastroparesis, though why the symptoms have appeared just during this hospitalization is not clear to me. The patient is not anxious for surgery. Will sign off. Planning on going to  [facility] on discharge.  Ovidio Kin, MD, [facility] Surgery Pager: [phone] Office phone:  [phone]

## 2012-08-23 DIAGNOSIS — I10 Essential (primary) hypertension: Secondary | ICD-10-CM

## 2012-08-23 LAB — BASIC METABOLIC PANEL
BUN: 9 mg/dL (ref 6–23)
CO2: 25 mEq/L (ref 19–32)
Calcium: 9.5 mg/dL (ref 8.4–10.5)
Creatinine, Ser: 0.77 mg/dL (ref 0.50–1.10)
GFR calc non Af Amer: >90 mL/min (ref >90)
Glucose, Bld: 130 mg/dL — ABNORMAL HIGH (ref 70–99)

## 2012-08-23 LAB — GLUCOSE, CAPILLARY
Glucose-Capillary: 129 mg/dL — ABNORMAL HIGH (ref 70–99)
Glucose-Capillary: 134 mg/dL — ABNORMAL HIGH (ref 70–99)
Glucose-Capillary: 99 mg/dL (ref 70–99)

## 2012-08-23 MED ORDER — METOPROLOL TARTRATE 1 MG/ML IV SOLN
5.0000 mg | Freq: Four times a day (QID) | INTRAVENOUS | Status: DC
  Administered 2012-08-23 – 2012-08-28 (×18): 5 mg via INTRAVENOUS
  Filled 2012-08-23 (×23): qty 5

## 2012-08-23 MED ORDER — POTASSIUM CHLORIDE IN NACL 40-0.9 MEQ/L-% IV SOLN
INTRAVENOUS | Status: DC
  Administered 2012-08-23 – 2012-08-24 (×2): via INTRAVENOUS
  Administered 2012-08-25: 50 mL/h via INTRAVENOUS
  Administered 2012-08-27: 23:00:00 via INTRAVENOUS
  Filled 2012-08-23 (×7): qty 1000

## 2012-08-23 NOTE — Progress Notes (Signed)
[facility] PROGRESS NOTE Subjective Pt reports less nausea with the reglan but makes her sleepy. She is just now getting up and moving around. EGD, CT, HIDA negative some sludge in GB surgery didn't feel significant.  Objective: Vital signs in last 24 hours: Temp:  [98.7 F (37.1 C)-99.1 F (37.3 C)] 98.7 F (37.1 C) (10/11 0533) Pulse Rate:  [103-107] 105  (10/11 0533) Resp:  [18] 18  (10/11 0533) BP: (120-176)/(79-108) 139/91 mmHg (10/11 0533) SpO2:  [98 %-99 %] 98 % (10/11 0533) Last BM Date: 08/18/12  Intake/Output from previous day: 10/10 0701 - 10/11 0700 In: 1090 [P.O.:130; I.V.:550; IV Piggyback:410] Out: 1600 [Urine:1600] Intake/Output this shift:    PE: Sleepy but alert. Abd-soft and nontender.  Lab Results: No results found for this basename: WBC:5,HGB:5,HCT:5,PLT:5 in the last 72 hours BMET  Basename 08/23/12 0355 08/22/12 0358 08/21/12 0438  NA 139 141 145  K 3.0* 3.3* 3.4*  CL 98 101 105  CO2 25 27 28   CREATININE 0.77 0.82 0.83   LFT No results found for this basename: PROT:3ALBUMIN:3,AST:3,ALT:3,ALKPHOS:3,BILITOT:3,BILIDIR:3,IBILI:3 in the last 72 hours PT/INR No results found for this basename: LABPROT:3,INR:3 in the last 72 hours PANCREAS No results found for this basename: LIPASE:3 in the last 72 hours       Studies/Results: No results found.  Medications: I have reviewed the patient's current medications.  Assessment/Plan: 1. N+V/Gastroparesis. Pt currently on IV metoclopramide, erythromycin and prn zofran and phergan with negative work up as above. If she doesn't improve soon, may need a temporary nasoduod feeding tube.   Cheryll Keisler JR,Felix Pratt L 08/23/2012, 9:09 AM

## 2012-08-23 NOTE — Progress Notes (Signed)
Assessed pt and agree with previous RN K. Heater's shift assessment.

## 2012-08-23 NOTE — Progress Notes (Signed)
Pt still very reluctant to NG tube to be placed for nutrition due to constant nausea and vomiting. MD paged, orders received. WIll continue to monitor. Ky Moskowitz A

## 2012-08-23 NOTE — Progress Notes (Signed)
[facility] PROGRESS NOTE  Jennifer [name] ION:[id number] DOB: 1961/05/12 DOA: 07/31/2012 PCP: No primary provider on file.  Assessment/Plan: Hypotension  -resolved now  -Possibly secondary to orthostatic hypotension, fluid boluses as needed, d/c zosyn. Negative procalcitonin and lactic acid levels, repeated blood cultures for sepsis neg workup on 9/24 negative to date  -Hemoglobin stable, cardiac enzymes negative.  -resolved with fluid resuscitation  -concerning for possible liver shock from acute hypotension on 9/24  See below  Altered mental status/decreased responsiveness  -Secondary to above .  -resolved now with fluid resuscitation   Nausea and Vomiting  - continue zofran and phenergan IV, added reglan  - ambulate as tolerated  - added protonix  - Diabetic Gastroparesis we ordered a gastric emptying study which she could not undergo, so we are starting reglan IV, her nausea and vomiting improved. Have increased her Reglan to 10 mg IV 4 times a day plus increased dose of Zofran. She had some minimal improvement from that so have added IV erythromycin. - GI consulted: HIDA Scan, see orders . A repeated abdominal series looking for obstruction was negative for back or constipation. again to see if she has obstruction, her abd scan is negative for obstruction or constipation. At this point she still taking barely any by mouth. Added pan the tube for tube feedings and may need to consider the possibility of a long-term PEG tube - advance diet as tolerated.   Diabetic foot ulcer/ Cellulitis  -Status post I&D per orthopedics on Monday patient with no further fevers  -DC'd Zosyn (could the antibiotics have caused this severe n/v?)  -MRI with no evidence of osteomyelitis  -Surgery repacked wound , follow up with surgery outpatient 1 week  - dc'd vanc as no MRSA grew on wound culture  Have added plan for supplementary nutrition to improve wound healing with pan the tube into  feedings  Elevated Liver Enzymes and Nausea and Vomiting  -improved.  -continue IV nausea meds  -acute hepatitis panel negative  -liver US reviewed no sig findings  -suspect shock liver, now enzymes normalized - GI consult appreciated  -neg HIDA scan  Hypertension  Pressures are elevated so have added IV Lopressor which should also help with her heart rate DM2 (diabetes mellitus, type 2) with hypoglycemic episodes  -BG trending down and some hypoglycemia, decrease Lantus  - no recent hypoglycemia  Normocytic Anemia, multifactorial  - hemoccult of stool negative  - following  Hypokalemia  Continues his nausea and vomiting. Had changed IV fluids to add potassium to the  Near syncope Assisted Fall; Early on 10/05, patient felt and had a presyncopal episode. On call Physician called and she was transferred to telemetry bed. 12lead EKG was obtained, which showed NSR, troponins were negative. ECHO pending. Will continue to monitor. I believe this is secondary to orthostatic hypotension. We have stopped norvasc and iv lopressor which was prn, but never given. We have ordered compression stockings. And I have started her on fludrocortisone 0.1mg  daily to see if her orthostatic hypotension improved. We will continue with fludrocortisone and fluids.    Code Status: Full code  Disposition Plan: Will need short-term skilled nursing, for deconditioning persists   Consultants:  [facility]  General surgery-signed off today 10/10  Antibiotics:  None  HPI/Subjective: Jennifer [name] is a 51 y.o. female with PMH of DM2 diagnosed 13 years ago, who tried to cut what she thought was a callus off of her foot 2 weeks ago. She thought nothing of  it until 2 days ago when her foot and ankle began to swell up. Finally today she noticed drainage through her sock and a very foul smell when she took her sock off. This prompted the patient to present to the ED for treatment. Evaluation in the ED  demonstrated a non painful diabetic foot ulcer with purulent drainage and obvious edema of leg and erythema around ulcer. Lab work up showed leukocytosis, X-ray of foot didn't show any obvious osteomylitis, patient started on zosyn hospitalist has been asked to admit for IV antibiotics. Pt had debridement done in OR and subsequently has developed severe nausea and vomiting. Pt had an episode of hypotension on 9/24 and developed shock liver. She is not able to take anything orally without significant nausea. GI consulted and following. EGD negative. HIDA scan and CT scan negative. She also has been very orthostatic, we have bolused her and put her on maintenance fluids.Started the patient on fludrocortisone but without any improvement. Her Echo is not significant.   Patient is a still having a moderate amount of nausea, not really taking any solid food or liquid in. Barely able taking her medications   Objective: Filed Vitals:   08/22/12 2300 08/23/12 0050 08/23/12 0533 08/23/12 1416  BP: 176/91 122/84 139/91 158/85  Pulse:   105 101  Temp:   98.7 F (37.1 C) 98.7 F (37.1 C)  TempSrc:   Oral Oral  Resp:   18 18  Height:      Weight:      SpO2:   98% 98%    Intake/Output Summary (Last 24 hours) at 08/23/12 1431 Last data filed at 08/23/12 1417  Gross per 24 hour  Intake   1140 ml  Output    800 ml  Net    340 ml   Filed Weights   07/31/12 2000 08/01/12 0415  Weight: 70.761 kg (156 lb) 70.9 kg (156 lb 4.9 oz)    Exam:   General:  Looks older than stated age, fatigued, moderate distress secondary to persistent vomiting  Cardiovascular: Regular rate and rhythm, S1-S2  Respiratory: Clear to auscultation bilaterally  Abdomen: Soft, mild distention, hypoactive bowel sounds, nonspecific generalized tenderness  Musculoskeletal l: No clubbing or cyanosis or edema.  Data Reviewed: Basic Metabolic Panel:  Lab 08/23/12 8119 08/22/12 0358 08/21/12 0438 08/20/12 0415 08/19/12 0455    NA 139 141 145 142 140  K 3.0* 3.3* 3.4* 3.3* 3.5  CL 98 101 105 101 101  CO2 25 27 28 29 27   GLUCOSE 130* 125* 111* 138* 131*  BUN 9 9 10 9 9   CREATININE 0.77 0.82 0.83 0.80 0.72  CALCIUM 9.5 9.8 9.9 9.7 9.8  MG -- -- -- -- --  PHOS -- -- -- -- --   Liver Function Tests:  Lab 08/17/12 0410  AST 16  ALT 74*  ALKPHOS 105  BILITOT 0.7  PROT 6.8  ALBUMIN 3.0*   CBC:  Lab 08/18/12 0445 08/17/12 0410  WBC 7.3 8.4  NEUTROABS -- --  HGB 10.3* 10.6*  HCT 31.1* 31.8*  MCV 90.7 89.8  PLT 401* 470*   Cardiac Enzymes:  Lab 08/17/12 1615 08/17/12 1022 08/17/12 0410  CKTOTAL -- -- --  CKMB -- -- --  CKMBINDEX -- -- --  TROPONINI <0.30 <0.30 <0.30   CBG:  Lab 08/23/12 1134 08/23/12 0754 08/23/12 0307 08/22/12 2109 08/22/12 1804  GLUCAP 94 146* 129* 103* 134*     Scheduled Meds:    . erythromycin  250 mg Intravenous Q6H  . feeding supplement  1 Container Oral BID BM  . fludrocortisone  0.1 mg Oral Daily  . heparin  5,000 Units Subcutaneous Q8H  . influenza  inactive virus vaccine  0.5 mL Intramuscular Once  . insulin aspart  0-15 Units Subcutaneous TID WC  . metoCLOPramide (REGLAN) injection  10 mg Intravenous Q6H  . metoprolol  5 mg Intravenous Q6H  . midodrine  2.5 mg Oral TID WC  . multivitamin with minerals  1 tablet Oral Daily  . pantoprazole (PROTONIX) IV  40 mg Intravenous Q12H  . pneumococcal 23 valent vaccine  0.5 mL Intramuscular Once  . senna-docusate  2 tablet Oral BID   Continuous Infusions:    . 0.9 % NaCl with KCl 40 mEq / L    . DISCONTD: sodium chloride 50 mL/hr at 08/22/12 2300    Principal Problem:  *Diabetic foot ulcer Active Problems:  DM2 (diabetes mellitus, type 2)  Leukocytosis  Cellulitis  Hypokalemia  Hypotension  Elevated liver enzymes  Nausea & vomiting  Shock liver  Gastroparesis due to DM  Intractable nausea and vomiting  Postoperative nausea and vomiting  Hypertension  Orthostatic hypotension    Time spent: 25  minutes    Hollice Espy  [facility] Pager [phone]. If 8PM-8AM, please contact night-coverage at [email or web address], password [facility] 08/23/2012, 2:31 PM  LOS: 23 days             I will

## 2012-08-24 LAB — BASIC METABOLIC PANEL
Calcium: 9.4 mg/dL (ref 8.4–10.5)
GFR calc non Af Amer: >90 mL/min (ref >90)
Sodium: 138 mEq/L (ref 135–145)

## 2012-08-24 LAB — GLUCOSE, CAPILLARY
Glucose-Capillary: 160 mg/dL — ABNORMAL HIGH (ref 70–99)
Glucose-Capillary: 133 mg/dL — ABNORMAL HIGH (ref 70–99)
Glucose-Capillary: 139 mg/dL — ABNORMAL HIGH (ref 70–99)
Glucose-Capillary: 107 mg/dL — ABNORMAL HIGH (ref 70–99)

## 2012-08-24 NOTE — Progress Notes (Signed)
[facility] PROGRESS NOTE  Jennifer [name] ZOX:[id number] DOB: 1961-05-18 DOA: 07/31/2012 PCP: No primary provider on file.  Assessment/Plan: Hypotension  -resolved now  -Possibly secondary to orthostatic hypotension, fluid boluses as needed, d/c zosyn. Negative procalcitonin and lactic acid levels, repeated blood cultures for sepsis neg workup on 9/24 negative to date  -Hemoglobin stable, cardiac enzymes negative.  -resolved with fluid resuscitation  -concerning for possible liver shock from acute hypotension on 9/24  See below  Altered mental status/decreased responsiveness  -Secondary to above .  -resolved now with fluid resuscitation   Nausea and Vomiting  - continue zofran and phenergan IV, added reglan  - ambulate as tolerated  - added protonix  - Diabetic Gastroparesis we ordered a gastric emptying study which she could not undergo, so we are starting reglan IV, her nausea and vomiting improved. Have increased her Reglan to 10 mg IV 4 times a day plus increased dose of Zofran. She had some minimal improvement from that so have added IV erythromycin. - GI consulted: HIDA Scan, see orders . A repeated abdominal series looking for obstruction was negative for back or constipation. again to see if she has obstruction, her abd scan is negative for obstruction or constipation. At this point she still taking barely any by mouth. No reason for nasogastric tube as nothing does pass stomach. We'll discuss with GI and plan for J-tube hopefully on Monday.  Diabetic foot ulcer/ Cellulitis  -Status post I&D per orthopedics on Monday patient with no further fevers  -DC'd Zosyn (could the antibiotics have caused this severe n/v?)  -MRI with no evidence of osteomyelitis  -Surgery repacked wound , follow up with surgery outpatient 1 week  - dc'd vanc as no MRSA grew on wound culture  Have added plan for supplementary nutrition to improve wound healing with J-tube and tube feedings  Elevated  Liver Enzymes and Nausea and Vomiting  -improved.  -continue IV nausea meds  -acute hepatitis panel negative  -liver US reviewed no sig findings  -suspect shock liver, now enzymes normalized - GI consult appreciated  -neg HIDA scan  Hypertension  Pressures are elevated so have added IV Lopressor which should also help with her heart rate DM2 (diabetes mellitus, type 2) with hypoglycemic episodes  -BG trending down and some hypoglycemia, decrease Lantus  - no recent hypoglycemia  Normocytic Anemia, multifactorial  - hemoccult of stool negative  - following  Hypokalemia  Continues his nausea and vomiting. Had changed IV fluids to add potassium to the  Near syncope Assisted Fall; Early on 10/05, patient felt and had a presyncopal episode. On call Physician called and she was transferred to telemetry bed. 12lead EKG was obtained, which showed NSR, troponins were negative. ECHO pending. Will continue to monitor. I believe this is secondary to orthostatic hypotension. We have stopped norvasc and iv lopressor which was prn, but never given. We have ordered compression stockings. And I have started her on fludrocortisone 0.1mg  daily to see if her orthostatic hypotension improved. We will continue with fludrocortisone and fluids.    Code Status: Full code  Disposition Plan: Will need short-term skilled nursing, for deconditioning persists   Consultants:  [facility]  General surgery-signed off today 10/10  Antibiotics:  None  HPI/Subjective: Jennifer [name] is a 51 y.o. female with PMH of DM2 diagnosed 13 years ago, who tried to cut what she thought was a callus off of her foot 2 weeks ago. She thought nothing of it until 2 days ago  when her foot and ankle began to swell up. Finally today she noticed drainage through her sock and a very foul smell when she took her sock off. This prompted the patient to present to the ED for treatment. Evaluation in the ED demonstrated a non  painful diabetic foot ulcer with purulent drainage and obvious edema of leg and erythema around ulcer. Lab work up showed leukocytosis, X-ray of foot didn't show any obvious osteomylitis, patient started on zosyn hospitalist has been asked to admit for IV antibiotics. Pt had debridement done in OR and subsequently has developed severe nausea and vomiting. Pt had an episode of hypotension on 9/24 and developed shock liver. She is not able to take anything orally without significant nausea. GI consulted and following. EGD negative. HIDA scan and CT scan negative. She also has been very orthostatic, we have bolused her and put her on maintenance fluids.Started the patient on fludrocortisone but without any improvement. Her Echo is not significant.   Patient is a still having a moderate amount of nausea, not really taking any solid food or liquid in. Barely able taking her medications   Objective: Filed Vitals:   08/23/12 1735 08/23/12 2210 08/24/12 0344 08/24/12 0607  BP: 146/81 136/73 170/96 155/80  Pulse: 90 98 93 102  Temp:  98.9 F (37.2 C) 98.7 F (37.1 C)   TempSrc:  Oral Oral   Resp:  16 16   Height:      Weight:      SpO2:  98% 99%     Intake/Output Summary (Last 24 hours) at 08/24/12 1214 Last data filed at 08/24/12 0900  Gross per 24 hour  Intake 875.83 ml  Output   1525 ml  Net -649.17 ml   Filed Weights   07/31/12 2000 08/01/12 0415  Weight: 70.761 kg (156 lb) 70.9 kg (156 lb 4.9 oz)    Exam:   General:  Looks older than stated age, fatigued, moderate distress secondary to persistent vomiting  Cardiovascular: Regular rate and rhythm, S1-S2  Respiratory: Clear to auscultation bilaterally  Abdomen: Soft, mild distention, hypoactive bowel sounds, nonspecific generalized tenderness  Musculoskeletal l: No clubbing or cyanosis or edema.  Data Reviewed: Basic Metabolic Panel:  Lab 08/24/12 8119 08/23/12 0355 08/22/12 0358 08/21/12 0438 08/20/12 0415  NA 138 139 141  145 142  K 3.1* 3.0* 3.3* 3.4* 3.3*  CL 99 98 101 105 101  CO2 27 25 27 28 29   GLUCOSE 132* 130* 125* 111* 138*  BUN 8 9 9 10 9   CREATININE 0.75 0.77 0.82 0.83 0.80  CALCIUM 9.4 9.5 9.8 9.9 9.7  MG -- -- -- -- --  PHOS -- -- -- -- --   CBC:  Lab 08/18/12 0445  WBC 7.3  NEUTROABS --  HGB 10.3*  HCT 31.1*  MCV 90.7  PLT 401*   Cardiac Enzymes:  Lab 08/17/12 1615  CKTOTAL --  CKMB --  CKMBINDEX --  TROPONINI <0.30   CBG:  Lab 08/24/12 0813 08/24/12 0340 08/23/12 2203 08/23/12 1737 08/23/12 1134  GLUCAP 160* 126* 99 134* 94     Scheduled Meds:    . erythromycin  250 mg Intravenous Q6H  . feeding supplement  1 Container Oral BID BM  . fludrocortisone  0.1 mg Oral Daily  . heparin  5,000 Units Subcutaneous Q8H  . influenza  inactive virus vaccine  0.5 mL Intramuscular Once  . insulin aspart  0-15 Units Subcutaneous TID WC  . metoCLOPramide (REGLAN) injection  10  mg Intravenous Q6H  . metoprolol  5 mg Intravenous Q6H  . midodrine  2.5 mg Oral TID WC  . multivitamin with minerals  1 tablet Oral Daily  . pantoprazole (PROTONIX) IV  40 mg Intravenous Q12H  . pneumococcal 23 valent vaccine  0.5 mL Intramuscular Once  . senna-docusate  2 tablet Oral BID   Continuous Infusions:    . 0.9 % NaCl with KCl 40 mEq / L 50 mL/hr at 08/23/12 1617  . DISCONTD: sodium chloride 50 mL/hr at 08/22/12 2300    Principal Problem:  *Diabetic foot ulcer Active Problems:  DM2 (diabetes mellitus, type 2)  Leukocytosis  Cellulitis  Hypokalemia  Hypotension  Elevated liver enzymes  Nausea & vomiting  Shock liver  Gastroparesis due to DM  Intractable nausea and vomiting  Postoperative nausea and vomiting  Hypertension  Orthostatic hypotension    Time spent: 15 minutes    Hollice Espy  [facility] Pager [phone]. If 8PM-8AM, please contact night-coverage at [email or web address], password [facility] 08/24/2012, 12:14 PM  LOS: 24 days             I will

## 2012-08-24 NOTE — Progress Notes (Signed)
GASTROENTEROLOGY PROGRESS NOTE  Problem:   Recurrent nausea and vomiting, presumed diabetic gastroparesis.  Subjective: Tolerated breakfast this morning, but vomited after eating a slice of pizza for lunch. The patient indicates she was eating completely normal he a couple of months ago, basically until she came into the hospital 5 weeks ago with her infected toe.  Objective: Appears slightly uncomfortable lying in bed, but not in acute distress. Has wash basin full of paper towels with a lot of spitting up of oral secretions, but no frank emesis.   Patient was unable to tolerate gastric emptying scan.  Currently on IV erythromycin as well as IV Reglan, not on any narcotics or other medications which should significantly impair gastric motility.   Endoscopic evaluation negative. Ultrasound shows sludge, HIDA negative.  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  Plan: Trial of gastroparesis diet, beginning with frequent small aliquots of liquids, specifically, 30 ML's every 30 minutes while awake, in lieu of any solid food intake. Rationale behind doing this described in detail to the patient, and she is agreeable to giving it a try.  Jennifer [name], M.D. 08/24/2012 5:12 PM

## 2012-08-25 LAB — GLUCOSE, CAPILLARY
Glucose-Capillary: 149 mg/dL — ABNORMAL HIGH (ref 70–99)
Glucose-Capillary: 134 mg/dL — ABNORMAL HIGH (ref 70–99)
Glucose-Capillary: 102 mg/dL — ABNORMAL HIGH (ref 70–99)

## 2012-08-25 LAB — BASIC METABOLIC PANEL
CO2: 28 mEq/L (ref 19–32)
Calcium: 9.3 mg/dL (ref 8.4–10.5)
Chloride: 100 mEq/L (ref 96–112)
GFR calc Af Amer: >90 mL/min (ref >90)
Sodium: 137 mEq/L (ref 135–145)

## 2012-08-25 MED ORDER — NORTRIPTYLINE HCL 10 MG PO CAPS
10.0000 mg | ORAL_CAPSULE | Freq: Every day | ORAL | Status: DC
  Administered 2012-08-26 – 2012-08-27 (×2): 10 mg via ORAL
  Filled 2012-08-25 (×4): qty 1

## 2012-08-25 MED ORDER — POTASSIUM CHLORIDE 10 MEQ/100ML IV SOLN
10.0000 meq | INTRAVENOUS | Status: AC
  Administered 2012-08-25 (×3): 10 meq via INTRAVENOUS
  Filled 2012-08-25 (×3): qty 100

## 2012-08-25 MED ORDER — SCOPOLAMINE 1 MG/3DAYS TD PT72
1.0000 | MEDICATED_PATCH | TRANSDERMAL | Status: DC
  Administered 2012-08-25 – 2012-08-28 (×2): 1.5 mg via TRANSDERMAL
  Filled 2012-08-25 (×2): qty 1

## 2012-08-25 NOTE — Progress Notes (Signed)
Pt was insisting on drinking more water so I gave her 60 mL. She made the comment that I was being stingy. I reminded her of only drinking 30mL of water every 30 minutes to avoid feeling nauseas. Patient stated that she understood and would hold off on drinking more water.

## 2012-08-25 NOTE — Progress Notes (Signed)
[facility] PROGRESS NOTE  Jennifer [name] AVW:[id number] DOB: 1961-04-29 DOA: 07/31/2012 PCP: No primary provider on file.  Assessment/Plan: Hypotension  -resolved now  -Possibly secondary to orthostatic hypotension, fluid boluses as needed, d/c zosyn. Negative procalcitonin and lactic acid levels, repeated blood cultures for sepsis neg workup on 9/24 negative to date  -Hemoglobin stable, cardiac enzymes negative.  -resolved with fluid resuscitation  -concerning for possible liver shock from acute hypotension on 9/24  See below  Altered mental status/decreased responsiveness  -Secondary to above .  -resolved now with fluid resuscitation   Nausea and Vomiting  - continue zofran and phenergan IV, added reglan  - ambulate as tolerated  - added protonix  - Diabetic Gastroparesis we ordered a gastric emptying study which she could not undergo, so we are starting reglan IV, her nausea and vomiting improved. Have increased her Reglan to 10 mg IV 4 times a day plus increased dose of Zofran. She had some minimal improvement from that so have added IV erythromycin. - GI consulted: HIDA Scan, see orders . A repeated abdominal series looking for obstruction was negative for back or constipation. again to see if she has obstruction, her abd scan is negative for obstruction or constipation. At this point she still taking barely any by mouth. No reason for nasogastric tube as nothing does pass stomach. We'll discuss with GI and plan for J-tube hopefully on Monday, Will consult interventional radiology In review of the literature, cisapride unable to be given. Have added scopolamine and Pamelor to further help with nausea.  Reviewed medications. Nothing else could be contributing to her nausea.  Diabetic foot ulcer/ Cellulitis  -Status post I&D per orthopedics on Monday patient with no further fevers  -DC'd Zosyn (could the antibiotics have caused this severe n/v?)  -MRI with no evidence of  osteomyelitis  -Surgery repacked wound , follow up with surgery outpatient 1 week  - dc'd vanc as no MRSA grew on wound culture  Have added plan for supplementary nutrition to improve wound healing with J-tube and tube feedings  Elevated Liver Enzymes and Nausea and Vomiting  -improved.  -continue IV nausea meds  -acute hepatitis panel negative  -liver US reviewed no sig findings  -suspect shock liver, now enzymes normalized - GI consult appreciated  -neg HIDA scan  Hypertension  Pressures are elevated so have added IV Lopressor which should also help with her heart rate DM2 (diabetes mellitus, type 2) with hypoglycemic episodes  -BG trending down and some hypoglycemia, decrease Lantus  - no recent hypoglycemia  Normocytic Anemia, multifactorial  - hemoccult of stool negative  - following  Hypokalemia  Continues his nausea and vomiting. Had changed IV fluids to add potassium to the  Near syncope Assisted Fall; Early on 10/05, patient felt and had a presyncopal episode. On call Physician called and she was transferred to telemetry bed. 12lead EKG was obtained, which showed NSR, troponins were negative. ECHO pending. Will continue to monitor. I believe this is secondary to orthostatic hypotension. We have stopped norvasc and iv lopressor which was prn, but never given. We have ordered compression stockings. And I have started her on fludrocortisone 0.1mg  daily to see if her orthostatic hypotension improved. We will continue with fludrocortisone and fluids.    Code Status: Full code  Disposition Plan: Will need short-term skilled nursing, for deconditioning persists   Consultants:  [facility]  General surgery-signed off today 10/10  Antibiotics:  None  HPI/Subjective: Jennifer [name] is a 51 y.o.  female with PMH of DM2 diagnosed 13 years ago, who tried to cut what she thought was a callus off of her foot 2 weeks ago. She thought nothing of it until 2 days ago when  her foot and ankle began to swell up. Finally today she noticed drainage through her sock and a very foul smell when she took her sock off. This prompted the patient to present to the ED for treatment. Evaluation in the ED demonstrated a non painful diabetic foot ulcer with purulent drainage and obvious edema of leg and erythema around ulcer. Lab work up showed leukocytosis, X-ray of foot didn't show any obvious osteomylitis, patient started on zosyn hospitalist has been asked to admit for IV antibiotics. Pt had debridement done in OR and subsequently has developed severe nausea and vomiting. Pt had an episode of hypotension on 9/24 and developed shock liver. She is not able to take anything orally without significant nausea. GI consulted and following. EGD negative. HIDA scan and CT scan negative. She also has been very orthostatic, we have bolused her and put her on maintenance fluids.Started the patient on fludrocortisone but without any improvement. Her Echo is not significant.   Patient is a still having a moderate amount of nausea, not really taking any solid food or liquid in. Barely able taking her medications, little change from the last few days   Objective: Filed Vitals:   08/24/12 1808 08/24/12 2135 08/25/12 0525 08/25/12 0701  BP: 160/106 144/82 167/91 153/88  Pulse: 96 80 96   Temp:  98.9 F (37.2 C) 98.9 F (37.2 C)   TempSrc:  Oral Oral   Resp:  20    Height:      Weight:      SpO2:  98% 98%     Intake/Output Summary (Last 24 hours) at 08/25/12 1145 Last data filed at 08/25/12 1052  Gross per 24 hour  Intake 2147.5 ml  Output   2900 ml  Net -752.5 ml   Filed Weights   07/31/12 2000 08/01/12 0415  Weight: 70.761 kg (156 lb) 70.9 kg (156 lb 4.9 oz)    Exam:   General:  Looks older than stated age, fatigued, moderate distress secondary to persistent vomiting  Cardiovascular: Regular rate and rhythm, S1-S2  Respiratory: Clear to auscultation bilaterally  Abdomen:  Soft, mild distention, hypoactive bowel sounds, nonspecific generalized tenderness  Musculoskeletal l: No clubbing or cyanosis or edema.  Data Reviewed: Basic Metabolic Panel:  Lab 08/25/12 1610 08/24/12 0437 08/23/12 0355 08/22/12 0358 08/21/12 0438  NA 137 138 139 141 145  K 2.9* 3.1* 3.0* 3.3* 3.4*  CL 100 99 98 101 105  CO2 28 27 25 27 28   GLUCOSE 129* 132* 130* 125* 111*  BUN 5* 8 9 9 10   CREATININE 0.74 0.75 0.77 0.82 0.83  CALCIUM 9.3 9.4 9.5 9.8 9.9  MG -- -- -- -- --  PHOS -- -- -- -- --   CBC: No results found for this basename: WBC:5,NEUTROABS:5,HGB:5,HCT:5,MCV:5,PLT:5 in the last 168 hours Cardiac Enzymes: No results found for this basename: CKTOTAL:5,CKMB:5,CKMBINDEX:5,TROPONINI:5 in the last 168 hours CBG:  Lab 08/25/12 1119 08/25/12 0756 08/25/12 0310 08/24/12 2133 08/24/12 1715  GLUCAP 134* 149* 125* 107* 139*     Scheduled Meds:    . erythromycin  250 mg Intravenous Q6H  . feeding supplement  1 Container Oral BID BM  . fludrocortisone  0.1 mg Oral Daily  . heparin  5,000 Units Subcutaneous Q8H  . influenza  inactive  virus vaccine  0.5 mL Intramuscular Once  . insulin aspart  0-15 Units Subcutaneous TID WC  . metoCLOPramide (REGLAN) injection  10 mg Intravenous Q6H  . metoprolol  5 mg Intravenous Q6H  . midodrine  2.5 mg Oral TID WC  . multivitamin with minerals  1 tablet Oral Daily  . nortriptyline  10 mg Oral QHS  . pantoprazole (PROTONIX) IV  40 mg Intravenous Q12H  . pneumococcal 23 valent vaccine  0.5 mL Intramuscular Once  . scopolamine  1 patch Transdermal Q72H  . senna-docusate  2 tablet Oral BID   Continuous Infusions:    . 0.9 % NaCl with KCl 40 mEq / L 50 mL/hr (08/25/12 0113)    Principal Problem:  *Diabetic foot ulcer Active Problems:  DM2 (diabetes mellitus, type 2)  Leukocytosis  Cellulitis  Hypokalemia  Hypotension  Elevated liver enzymes  Nausea & vomiting  Shock liver  Gastroparesis due to DM  Intractable nausea and  vomiting  Postoperative nausea and vomiting  Hypertension  Orthostatic hypotension    Time spent: 15 minutes    Hollice Espy  [facility] Pager [phone]. If 8PM-8AM, please contact night-coverage at [email or web address], password [facility] 08/25/2012, 11:45 AM  LOS: 25 days             I will

## 2012-08-25 NOTE — Progress Notes (Signed)
CSW spoke with facility about updated clinicals. Facility would like to wait until disposition tomorrow before sending additional clinical information.   Weekday CSW to f/u with d/c planning.   Leron Croak, LCSWA [employer] [phone]

## 2012-08-25 NOTE — Progress Notes (Signed)
Patient's right foot has no active drainage or sign of infection. She does have a large amount of dead skin accumulated on the plantar aspect of the foot. I would like to get a wound care consult to see if some of the wound can be debrided in her room.

## 2012-08-25 NOTE — Progress Notes (Signed)
The patient has generally been tolerating her frequent small aliquots of liquids, although on one occasion, when she drank it quickly, she vomited thereafter.   She looks somewhat better today.  Impression: Probable diabetic gastroparesis, see yesterday's note for further details.  Plan: Continued trial of frequent small aliquots of liquids. If this is tolerated, I would recommend gradually increasing the size of the aliquots, and eventually increasing the intervals between the aliquots (currently every 30 minutes). Finally, if this is tolerated, she could be advanced to a low residue diet. We will continue to follow the patient with you.  Please call if you have questions.  Florencia Reasons, M.D. [phone]

## 2012-08-26 LAB — BASIC METABOLIC PANEL
BUN: 5 mg/dL — ABNORMAL LOW (ref 6–23)
CO2: 28 mEq/L (ref 19–32)
Calcium: 9.2 mg/dL (ref 8.4–10.5)
Creatinine, Ser: 0.85 mg/dL (ref 0.50–1.10)

## 2012-08-26 LAB — GLUCOSE, CAPILLARY
Glucose-Capillary: 153 mg/dL — ABNORMAL HIGH (ref 70–99)
Glucose-Capillary: 94 mg/dL (ref 70–99)

## 2012-08-26 MED ORDER — FLUDROCORTISONE ACETATE 0.1 MG PO TABS
0.1000 mg | ORAL_TABLET | Freq: Two times a day (BID) | ORAL | Status: DC
  Administered 2012-08-26 – 2012-08-28 (×4): 0.1 mg via ORAL
  Filled 2012-08-26 (×5): qty 1

## 2012-08-26 NOTE — Consult Note (Signed)
WOC consult Note Reason for Consult:large amount of dry skin on plantar aspect of right foot; DFU measuring .5cm round x .2cm depth at 1st metatarsal head Wound type:Diabetic foot ulcer (Neuropathic ulceration) Pressure Ulcer POA: No Measurement:As above Wound bed:not easy to visualize Drainage (amount, consistency, odor) none Periwound:large amount of dry, peeling skin on plantar aspect of right foot Dressing procedure/placement/frequency: I am not able to debride this tissue and recommend referral to the [facility] or to an orthopedic or podiatric MD post discharge  for continued care and management of the dry skin and the ulceration. I will recommend softening and moisturizing of the skin-not between the toes-that can hopefully aid in the removal of this skin and placed have those orders for nursing to begin today.  The ulceration may require further debridement, but at a minimum, she will need a plan for off-loading of this area to prevent continued and worsening of the DFU. I will not follow.  Please re-consult if needed. Thanks, Ladona Mow, MSN, RN, [facility], CWOCN [phone])

## 2012-08-26 NOTE — Progress Notes (Signed)
PT Cancellation Note  ___Treatment cancelled today due to medical issues with patient which prohibited   therapy  ___ Treatment cancelled today due to patient receiving procedure or test   ___ Treatment cancelled today due to patient's refusal to participate   _x__ Treatment cancelled today due to per RN, very orthostatic, Jennifer [name] PT [phone]

## 2012-08-26 NOTE — Progress Notes (Signed)
[facility] PROGRESS NOTE  Jennifer [name] XBJ:[id number] DOB: 1961-05-26 DOA: 07/31/2012 PCP: No primary provider on file.  Assessment/Plan: Hypotension   secondary to orthostatic hypotension, fluid boluses as needed, d/c zosyn. Negative procalcitonin and lactic acid levels, repeated blood cultures for sepsis neg workup on 9/24 negative to date, which is secondary to poor by mouth intake. Still persisting today so have increased her Florinef to twice a day  -Hemoglobin stable, cardiac enzymes negative.  -resolved with fluid resuscitation  -concerning for possible liver shock from acute hypotension on 9/24  See below  Altered mental status/decreased responsiveness  -Secondary to above .  -resolved now with fluid resuscitation   Nausea and Vomiting  - continue zofran and phenergan IV, added reglan  - ambulate as tolerated  - added protonix  - Diabetic Gastroparesis we ordered a gastric emptying study which she could not undergo, so we are starting reglan IV, her nausea and vomiting improved. Have increased her Reglan to 10 mg IV 4 times a day plus increased dose of Zofran. She had some minimal improvement from that so have added IV erythromycin. - GI consulted: HIDA Scan, see orders . A repeated abdominal series looking for obstruction was negative for back or constipation. again to see if she has obstruction, her abd scan is negative for obstruction or constipation. At this point she still taking barely any by mouth. No reason for nasogastric tube as nothing does pass stomach.  In review of the literature, cisapride unable to be given. Have added scopolamine and Pamelor to further help with nausea.  Reviewed medications. Nothing else could be contributing to her nausea. Patient felt low but better this morning want to try toast which may lead to nausea and vomiting. At this point we have no other options and have consult with interventional radiology for placement of J-tube.  Diabetic  foot ulcer/ Cellulitis  -Status post I&D per orthopedics on Monday patient with no further fevers  -DC'd Zosyn (could the antibiotics have caused this severe n/v?)  -MRI with no evidence of osteomyelitis  -Surgery repacked wound , follow up with surgery outpatient 1 week  - dc'd vanc as no MRSA grew on wound culture  Appreciate orthopedic and wound care followup  Have added plan for supplementary nutrition to improve wound healing with J-tube and tube feedings  Elevated Liver Enzymes and Nausea and Vomiting  -improved.  -continue IV nausea meds  -acute hepatitis panel negative  -liver US reviewed no sig findings  -suspect shock liver, now enzymes normalized - GI consult appreciated  -neg HIDA scan  Hypertension  Pressures are elevated so have added IV Lopressor which should also help with her heart rate DM2 (diabetes mellitus, type 2) with hypoglycemic episodes  -BG trending down and some hypoglycemia, decrease Lantus  - no recent hypoglycemia  Normocytic Anemia, multifactorial  - hemoccult of stool negative  - following  Hypokalemia  Continues his nausea and vomiting. Had changed IV fluids to add potassium to the  Near syncope Assisted Fall; Early on 10/05, patient felt and had a presyncopal episode. On call Physician called and she was transferred to telemetry bed. 12lead EKG was obtained, which showed NSR, troponins were negative. ECHO pending. Will continue to monitor. I believe this is secondary to orthostatic hypotension. We have stopped norvasc and iv lopressor which was prn, but never given. We have ordered compression stockings. And I have started her on fludrocortisone 0.1mg  daily to see if her orthostatic hypotension improved. We will continue with  fludrocortisone and fluids.    Code Status: Full code  Disposition Plan: Will need short-term skilled nursing, for deconditioning persists   [facility]ants:  [facility] -  General  surgery-signed off  10/10  Antibiotics:  None  HPI/Subjective: Jennifer [name] is a 51 y.o. female with PMH of DM2 diagnosed 13 years ago, who tried to cut what she thought was a callus off of her foot 2 weeks ago. She thought nothing of it until 2 days ago when her foot and ankle began to swell up. Finally today she noticed drainage through her sock and a very foul smell when she took her sock off. This prompted the patient to present to the ED for treatment. Evaluation in the ED demonstrated a non painful diabetic foot ulcer with purulent drainage and obvious edema of leg and erythema around ulcer. Lab work up showed leukocytosis, X-ray of foot didn't show any obvious osteomylitis, patient started on zosyn hospitalist has been asked to admit for IV antibiotics. Pt had debridement done in OR and subsequently has developed severe nausea and vomiting. Pt had an episode of hypotension on 9/24 and developed shock liver. She is not able to take anything orally without significant nausea. GI consulted and following. EGD negative. HIDA scan and CT scan negative. She also has been very orthostatic, we have bolused her and put her on maintenance fluids.Started the patient on fludrocortisone but without any improvement. Her Echo is not significant.    patient attempted food this morning and again throughout. Hypotensive secondary orthostatic hypotension. Have put in phone call for interventional radiology consult for J-tube   Objective: Filed Vitals:   08/26/12 0855 08/26/12 0920 08/26/12 0921 08/26/12 0922  BP: 149/97 178/83 130/73 73/45  Pulse:  88 89 89  Temp:      TempSrc:      Resp:      Height:      Weight:      SpO2:        Intake/Output Summary (Last 24 hours) at 08/26/12 1212 Last data filed at 08/26/12 4782  Gross per 24 hour  Intake 1872.5 ml  Output   3050 ml  Net -1177.5 ml   Filed Weights   07/31/12 2000 08/01/12 0415  Weight: 70.761 kg (156 lb) 70.9 kg (156 lb 4.9 oz)     Exam:   General:  Looks older than stated age, fatigued, moderate distress secondary to persistent vomiting  Cardiovascular: Regular rate and rhythm, S1-S2  Respiratory: Clear to auscultation bilaterally  Abdomen: Soft, mild distention, hypoactive bowel sounds, nonspecific generalized tenderness  Musculoskeletal l: No clubbing or cyanosis or edema.  Data Reviewed: Basic Metabolic Panel:  Lab 08/26/12 9562 08/25/12 0500 08/24/12 0437 08/23/12 0355 08/22/12 0358  NA 138 137 138 139 141  K 3.2* 2.9* 3.1* 3.0* 3.3*  CL 101 100 99 98 101  CO2 28 28 27 25 27   GLUCOSE 183* 129* 132* 130* 125*  BUN 5* 5* 8 9 9   CREATININE 0.85 0.74 0.75 0.77 0.82  CALCIUM 9.2 9.3 9.4 9.5 9.8  MG -- -- -- -- --  PHOS -- -- -- -- --   CBG:  Lab 08/26/12 1130 08/26/12 0728 08/26/12 0255 08/25/12 2153 08/25/12 1710  GLUCAP 94 210* 153* 109* 102*     Scheduled Meds:    . erythromycin  250 mg Intravenous Q6H  . feeding supplement  1 Container Oral BID BM  . fludrocortisone  0.1 mg Oral BID  . heparin  5,000 Units Subcutaneous Q8H  . influenza  inactive virus vaccine  0.5 mL Intramuscular Once  . insulin aspart  0-15 Units Subcutaneous TID WC  . metoCLOPramide (REGLAN) injection  10 mg Intravenous Q6H  . metoprolol  5 mg Intravenous Q6H  . midodrine  2.5 mg Oral TID WC  . multivitamin with minerals  1 tablet Oral Daily  . nortriptyline  10 mg Oral QHS  . pantoprazole (PROTONIX) IV  40 mg Intravenous Q12H  . pneumococcal 23 valent vaccine  0.5 mL Intramuscular Once  . potassium chloride  10 mEq Intravenous Q1 Hr x 3  . scopolamine  1 patch Transdermal Q72H  . senna-docusate  2 tablet Oral BID  . DISCONTD: fludrocortisone  0.1 mg Oral Daily   Continuous Infusions:    . 0.9 % NaCl with KCl 40 mEq / L 50 mL/hr (08/25/12 0113)    Principal Problem:  *Diabetic foot ulcer Active Problems:  DM2 (diabetes mellitus, type 2)  Leukocytosis  Cellulitis  Hypokalemia  Hypotension   Elevated liver enzymes  Nausea & vomiting  Shock liver  Gastroparesis due to DM  Intractable nausea and vomiting  Postoperative nausea and vomiting  Hypertension  Orthostatic hypotension    Time spent: 15 minutes    Hollice Espy  [facility] Pager [phone]. If 8PM-8AM, please contact night-coverage at [email or web address], password [facility] 08/26/2012, 12:12 PM  LOS: 26 days             I will

## 2012-08-26 NOTE — Progress Notes (Signed)
Discussed with Dr Rito Ehrlich. He has consulted IR for jejunostomy feeding tube, since gastroparesis issue is not improving. Will sign off, call us if needed.

## 2012-08-26 NOTE — Progress Notes (Signed)
Aware of request for GJ tube for severe gastroparesis. Will make NPO after MN and hold Hep starting tomorrow. Will try to get on schedule for tomorrow. Will update floor, ordering MD in am with definitive plans.

## 2012-08-27 DIAGNOSIS — K3184 Gastroparesis: Secondary | ICD-10-CM

## 2012-08-27 LAB — BASIC METABOLIC PANEL
BUN: 5 mg/dL — ABNORMAL LOW (ref 6–23)
Chloride: 101 mEq/L (ref 96–112)
Creatinine, Ser: 0.79 mg/dL (ref 0.50–1.10)
GFR calc Af Amer: >90 mL/min (ref >90)

## 2012-08-27 LAB — GLUCOSE, CAPILLARY
Glucose-Capillary: 196 mg/dL — ABNORMAL HIGH (ref 70–99)
Glucose-Capillary: 145 mg/dL — ABNORMAL HIGH (ref 70–99)
Glucose-Capillary: 110 mg/dL — ABNORMAL HIGH (ref 70–99)
Glucose-Capillary: 134 mg/dL — ABNORMAL HIGH (ref 70–99)

## 2012-08-27 NOTE — Progress Notes (Signed)
[facility] PROGRESS NOTE  Jennifer [name] ZOX:[id number] DOB: December 18, 1960 DOA: 07/31/2012 PCP: No primary provider on file.  Interim summary:  Jennifer [name] is a 51 y.o. female with PMH of DM2 diagnosed 13 years ago, who tried to cut what she thought was a callus off of her foot 2 weeks ago. 2 days prior to admission when her foot and ankle began to swell up, and then she noticed drainage through her sock and a very foul smell when she took her sock off. This prompted the patient to present to the ED for treatment. Evaluation in the ED demonstrated a non painful diabetic foot ulcer with purulent drainage and obvious edema of leg and erythema around ulcer. Lab work up showed leukocytosis, X-ray of foot didn't show any obvious osteomylitis, patient started on zosyn hospitalist has been asked to admit for IV antibiotics. Pt had debridement done in OR and subsequently has developed severe nausea and vomiting. Pt had an episode of hypotension on 9/24 and developed shock liver. She is not able to take anything orally without significant nausea. GI consulted and following. EGD negative. HIDA scan and CT scan negative. She also has been very orthostatic, we have bolused her and put her on maintenance fluids.echo was unremarkable. For the past 10-12 days, the biggest issue has been the patient's severe gastroparesis. Patient has had an extensive plan and workup by the hospitalist and gastroenterology service. Surgery was consult to rule out any gallbladder issues which they did. GI felt that this was gastroparesis and the patient has been on max therapy for Reglan as well as erythromycin. We have also tried scopolamine and other antinausea medications. Patient is very inconsistent and cannot keep down any food. She states that she does not like the hospital food and can keep down food from home, but this is been very inconsistent and the time she continues to throw up. She is openly thrown up when taking food in  front of the nurses. Wound care followup note of poor wound healing secondary to malnutrition. Interventional radiology was consult for placement of a J-tube, but they are better suited for G-tube placement. Surgery service has been reconsult on 10/15 for placement of J-tube. Patient provided by physical therapy and she is so deconditioned secondary to orthostasis, wound and malnutrition, they recommended short-term skilled nursing. The plan is that once patient starting on adequate nutrition, she will be discharged to a skilled nursing facility.  Assessment/Plan: Hypotension   Hemoglobin stable, cardiac enzymes negative.  -resolved with fluid resuscitation  -concerning for possible liver shock from acute hypotension on 9/24 secondary to orthostatic hypotension, fluid boluses as needed, egative procalcitonin and lactic acid levels, repeated blood cultures for sepsis neg workup on 9/24 negative to date, but as a contributing cause is her poor by mouth intake. We increased her Florinef on 10/14 to twice a day and her pressures have been elevated in the 150s. This point we'll cut back to one time a day.  See below  Altered mental status/decreased responsiveness  -Secondary to above .  -resolved now with fluid resuscitation   Nausea and Vomiting  - continue zofran and phenergan IV, added reglan  - ambulate as tolerated  - added protonix  - Diabetic Gastroparesis we ordered a gastric emptying study which she could not undergo, so we are starting reglan IV, her nausea and vomiting improved. Have increased her Reglan to 10 mg IV 4 times a day plus increased dose of Zofran. She had some minimal  improvement from that so have added IV erythromycin. - GI consulted: HIDA Scan, see orders . A repeated abdominal series looking for obstruction was negative for back or constipation. again to see if she has obstruction, her abd scan is negative for obstruction or constipation. At this point she still taking barely  any by mouth. No reason for nasogastric tube as nothing does pass stomach.  In review of the literature, cisapride unable to be given. Have added scopolamine and Pamelor to further help with nausea.  Reviewed medications. Nothing else could be contributing to her nausea. Patient felt low but better this morning want to try toast which may lead to nausea and vomiting. At this point we have no other options and have consult with Gen. surgery for placement of J-tube.  Diabetic foot ulcer/ Cellulitis  -Status post I&D per orthopedics on Monday patient with no further fevers  She's completed her course of Zosyn -MRI with no evidence of osteomyelitis  -Surgery repacked wound , follow up with surgery outpatient 1 week  - dc'd vanc as no MRSA grew on wound culture  Appreciate orthopedic and wound care followup  Have added plan for supplementary nutrition to improve wound healing with J-tube and tube feedings  Elevated Liver Enzymes and Nausea and Vomiting  -improved.  -continue IV nausea meds  -acute hepatitis panel negative  -liver US reviewed no sig findings  -suspect shock liver, now enzymes normalized - GI consult appreciated  -neg HIDA scan   DM2 (diabetes mellitus, type 2) with hypoglycemic episodes  -BG trending down and some hypoglycemia, decrease Lantus  - no recent hypoglycemia  Normocytic Anemia, multifactorial  - hemoccult of stool negative  - following  Hypokalemia  Continues his nausea and vomiting. Had changed IV fluids to add potassium to the  Near syncope Assisted Fall; Early on 10/05, patient felt and had a presyncopal episode. On call Physician called and she was transferred to telemetry bed. 12lead EKG was obtained, which showed NSR, troponins were negative. ECHO pending. Will continue to monitor. I believe this is secondary to orthostatic hypotension. We have stopped norvasc and iv lopressor which was prn, but never given. We have ordered compression stockings. And I  have started her on fludrocortisone 0.1mg  daily to see if her orthostatic hypotension improved. We will continue with fludrocortisone and fluids.    Code Status: Full code  Disposition Plan: Will need short-term skilled nursing, for deconditioning persists   Consultants:  [facility]14 -  General surgery-signed off  10/10, reconsulted on 10/15  Orthopedic surgery consulted on 9/20 and signed off on 10/30. They did a courtesy visit on 10/13 for followup wound care  Physical medicine rehabilitation consulted on 10/4 for evaluation of strength  Antibiotics:  None   Objective: Filed Vitals:   08/26/12 0922 08/26/12 1450 08/26/12 2232 08/27/12 0527  BP: 73/45 149/85 145/96 153/90  Pulse: 89 88 97 81  Temp:  98.2 F (36.8 C) 98.9 F (37.2 C) 99 F (37.2 C)  TempSrc:  Oral Oral Oral  Resp:  19 18 18   Height:      Weight:      SpO2:  98% 99% 98%    Intake/Output Summary (Last 24 hours) at 08/27/12 1327 Last data filed at 08/27/12 0900  Gross per 24 hour  Intake   1464 ml  Output   1501 ml  Net    -37 ml   Filed Weights   07/31/12 2000 08/01/12 0415  Weight: 70.761 kg (  156 lb) 70.9 kg (156 lb 4.9 oz)    Exam:   General:  Looks older than stated age, fatigued, moderate distress secondary to persistent vomiting  Cardiovascular: Regular rate and rhythm, S1-S2  Respiratory: Clear to auscultation bilaterally  Abdomen: Soft, mild distention, hypoactive bowel sounds, nonspecific generalized tenderness  Musculoskeletal l: No clubbing or cyanosis or edema.  Data Reviewed: Basic Metabolic Panel:  Lab 08/27/12 1610 08/26/12 0405 08/25/12 0500 08/24/12 0437 08/23/12 0355  NA 135 138 137 138 139  K 4.2 3.2* 2.9* 3.1* 3.0*  CL 101 101 100 99 98  CO2 24 28 28 27 25   GLUCOSE 174* 183* 129* 132* 130*  BUN 5* 5* 5* 8 9  CREATININE 0.79 0.85 0.74 0.75 0.77  CALCIUM 9.1 9.2 9.3 9.4 9.5  MG -- -- -- -- --  PHOS -- -- -- -- --   CBG:  Lab  08/27/12 1223 08/27/12 0728 08/27/12 0242 08/26/12 2041 08/26/12 1651  GLUCAP 110* 145* 196* 171* 143*     Scheduled Meds:    . erythromycin  250 mg Intravenous Q6H  . feeding supplement  1 Container Oral BID BM  . fludrocortisone  0.1 mg Oral BID  . heparin  5,000 Units Subcutaneous Q8H  . influenza  inactive virus vaccine  0.5 mL Intramuscular Once  . insulin aspart  0-15 Units Subcutaneous TID WC  . metoCLOPramide (REGLAN) injection  10 mg Intravenous Q6H  . metoprolol  5 mg Intravenous Q6H  . midodrine  2.5 mg Oral TID WC  . multivitamin with minerals  1 tablet Oral Daily  . nortriptyline  10 mg Oral QHS  . pantoprazole (PROTONIX) IV  40 mg Intravenous Q12H  . pneumococcal 23 valent vaccine  0.5 mL Intramuscular Once  . scopolamine  1 patch Transdermal Q72H  . senna-docusate  2 tablet Oral BID   Continuous Infusions:    . 0.9 % NaCl with KCl 40 mEq / L 50 mL/hr (08/25/12 0113)    Principal Problem:  *Diabetic foot ulcer Active Problems:  DM2 (diabetes mellitus, type 2)  Leukocytosis  Cellulitis  Hypokalemia  Hypotension  Elevated liver enzymes  Nausea & vomiting  Shock liver  Gastroparesis due to DM  Intractable nausea and vomiting  Postoperative nausea and vomiting  Hypertension  Orthostatic hypotension    Time spent: 15 minutes    Hollice Espy  [facility] Pager [phone]. If 8PM-8AM, please contact night-coverage at [email or web address], password [facility] 08/27/2012, 1:27 PM  LOS: 27 days             I will

## 2012-08-27 NOTE — Progress Notes (Signed)
Patient ID: Jennifer [name], female   DOB: Oct 24, 1961, 51 y.o.   MRN: [id number] 16 Days Post-Op  Subjective: Patient known to [place] for consideration of cholecystectomy last week, which was felt to be not indicated.  The patient has continued to struggle with tolerating liquids or solids.  She is having poor wound healing and becoming hypotensive due to her inability to tolerate a diet and keep herself hydrated.  The patient admits to me that she is able to tolerate food from outside the hospital well, but just not hospital food.  I spoke with the hospitalist, Dr. Rito Ehrlich who said she tolerates usually a few bites and then can not eat enough to maintain nutrition.  This is per the nurses report.  We have been asked to evaluate the patient again for consideration of J-tube placement for supplemental feedings.  Objective: Vital signs in last 24 hours: Temp:  [98.2 F (36.8 C)-99 F (37.2 C)] 99 F (37.2 C) (10/15 0527) Pulse Rate:  [81-97] 81  (10/15 0527) Resp:  [18-19] 18  (10/15 0527) BP: (145-153)/(85-96) 153/90 mmHg (10/15 0527) SpO2:  [98 %-99 %] 98 % (10/15 0527) Last BM Date: 08/26/12  Intake/Output from previous day: 10/14 0701 - 10/15 0700 In: 2287.3 [P.O.:180; I.V.:1593.3; IV Piggyback:514] Out: 2001 [Urine:2000; Stool:1] Intake/Output this shift: Total I/O In: 356.7 [I.V.:356.7] Out: -   PE: Abd: soft, NT, ND, few BS.  No masses, hernias, or organomegaly Heart: regular Lungs: CTAB  Lab Results:  No results found for this basename: WBC:2,HGB:2,HCT:2,PLT:2 in the last 72 hours BMET  [facility] 08/27/12 0416 08/26/12 0405  NA 135 138  K 4.2 3.2*  CL 101 101  CO2 24 28  GLUCOSE 174* 183*  BUN 5* 5*  CREATININE 0.79 0.85  CALCIUM 9.1 9.2   PT/INR No results found for this basename: LABPROT:2,INR:2 in the last 72 hours CMP     Component Value Date/Time   NA 135 08/27/2012 0416   K 4.2 08/27/2012 0416   CL 101 08/27/2012 0416   CO2 24 08/27/2012 0416   GLUCOSE 174*  08/27/2012 0416   BUN 5* 08/27/2012 0416   CREATININE 0.79 08/27/2012 0416   CALCIUM 9.1 08/27/2012 0416   PROT 6.8 08/17/2012 0410   ALBUMIN 3.0* 08/17/2012 0410   AST 16 08/17/2012 0410   ALT 74* 08/17/2012 0410   ALKPHOS 105 08/17/2012 0410   BILITOT 0.7 08/17/2012 0410   GFRNONAA >90 08/27/2012 0416   GFRAA >90 08/27/2012 0416   Lipase     Component Value Date/Time   LIPASE 18 10/13/2009 1645       Studies/Results: No results found.  Anti-infectives: Anti-infectives     Start     Dose/Rate Route Frequency Ordered Stop   08/22/12 1200   erythromycin 250 mg in sodium chloride 0.9 % 100 mL IVPB        250 mg 100 mL/hr over 60 Minutes Intravenous 4 times per day 08/22/12 0955     08/08/12 1200   vancomycin (VANCOCIN) IVPB 1000 mg/200 mL premix  Status:  Discontinued        1,000 mg 200 mL/hr over 60 Minutes Intravenous Every 24 hours 08/08/12 0035 08/09/12 1341   08/02/12 1200   vancomycin (VANCOCIN) IVPB 1000 mg/200 mL premix  Status:  Discontinued        1,000 mg 200 mL/hr over 60 Minutes Intravenous Every 12 hours 08/02/12 1032 08/08/12 0035   08/01/12 0600   piperacillin-tazobactam (ZOSYN) IVPB 3.375 g  Status:  Discontinued        3.375 g 12.5 mL/hr over 240 Minutes Intravenous 3 times per day 08/01/12 0207 08/10/12 1204   07/31/12 2245  piperacillin-tazobactam (ZOSYN) IVPB 3.375 g       3.375 g 100 mL/hr over 30 Minutes Intravenous  Once 07/31/12 2232 07/31/12 2328           Assessment/Plan  1. Gastroparesis, likely secondary to DM 2. Diabetic foot wound, s/p debridement  Plan: 1. Will discuss this case with Dr. Johna Sheriff and have him talk and evaluate the patient as well.  J tubes come with their own set of risks and complications, such as leaking, obstruction, clogging, inability to bolus feed, etc.  I have d/w the patient.  Await final recommendations after Dr. Jamse Mead evaluation.   LOS: 27 days    Karie Skowron E 08/27/2012, 12:14 PM Pager:  [phone]

## 2012-08-27 NOTE — Progress Notes (Signed)
I agree with assessment done at beginning of shift  On 08/26/12.

## 2012-08-27 NOTE — Progress Notes (Signed)
Right foot cleaned,Petrolatum gauze applied, 4x4 gauze with NS and dry 4x4 gauze applied and kerlix wrapped around foot/ankle. Patient tolerated well.

## 2012-08-27 NOTE — Progress Notes (Signed)
Assisted pt to bathroom, tolerated well until coming back to bed, pt c/o "lightheadedness".  Got her into bed, BP 90/57

## 2012-08-27 NOTE — Progress Notes (Signed)
Patient interviewed and examined, agree with PA note above.  I discussed placement of feeding jejunostomy with pt.  She states that she actually has been eating fairly normal amounts of food brought from home yesterday and today.  In light of this she does not not want to have feeding tube placed at present and this seems reasonable.  I will discuss with Dr Rito Ehrlich.  Mariella Saa MD, FACS  08/27/2012 6:01 PM

## 2012-08-28 LAB — BASIC METABOLIC PANEL
BUN: 3 mg/dL — ABNORMAL LOW (ref 6–23)
Calcium: 8.9 mg/dL (ref 8.4–10.5)
Creatinine, Ser: 0.8 mg/dL (ref 0.50–1.10)
GFR calc Af Amer: >90 mL/min (ref >90)
GFR calc non Af Amer: 85 mL/min — ABNORMAL LOW (ref >90)
Potassium: 3.2 mEq/L — ABNORMAL LOW (ref 3.5–5.1)

## 2012-08-28 LAB — CBC
MCHC: 34.4 g/dL (ref 30.0–36.0)
Platelets: 217 10*3/uL (ref 150–400)
RDW: 13.8 % (ref 11.5–15.5)

## 2012-08-28 LAB — GLUCOSE, CAPILLARY
Glucose-Capillary: 340 mg/dL — ABNORMAL HIGH (ref 70–99)
Glucose-Capillary: 136 mg/dL — ABNORMAL HIGH (ref 70–99)
Glucose-Capillary: 152 mg/dL — ABNORMAL HIGH (ref 70–99)

## 2012-08-28 MED ORDER — ALUM & MAG HYDROXIDE-SIMETH 200-200-20 MG/5ML PO SUSP: 30.0000 mL | ORAL | Status: DC | PRN

## 2012-08-28 MED ORDER — SCOPOLAMINE 1 MG/3DAYS TD PT72: 1.0000 | MEDICATED_PATCH | TRANSDERMAL | Status: DC

## 2012-08-28 MED ORDER — FLUDROCORTISONE ACETATE 0.1 MG PO TABS: 0.1000 mg | ORAL_TABLET | Freq: Two times a day (BID) | ORAL | Status: DC

## 2012-08-28 MED ORDER — POTASSIUM CHLORIDE CRYS ER 20 MEQ PO TBCR
40.0000 meq | EXTENDED_RELEASE_TABLET | ORAL | Status: AC
  Administered 2012-08-28: 40 meq via ORAL
  Filled 2012-08-28 (×2): qty 2

## 2012-08-28 MED ORDER — MIDODRINE HCL 2.5 MG PO TABS: 2.5000 mg | ORAL_TABLET | Freq: Three times a day (TID) | ORAL | Status: DC

## 2012-08-28 MED ORDER — INSULIN ASPART 100 UNIT/ML ~~LOC~~ SOLN: 0.0000 [IU] | Freq: Three times a day (TID) | SUBCUTANEOUS | Status: DC

## 2012-08-28 NOTE — Progress Notes (Signed)
Patient is set to discharge to [facility] SNF. [employer] insurance authorization was obtained by facility. Patient is agreeable to SNF. Patient's friend, Daron Offer will transport patient to facility around 4-4:30.   Clinical Social Work Department CLINICAL SOCIAL WORK PLACEMENT NOTE 08/28/2012  Patient:  ORMA, CHEETHAM  Account Number:  [id number] Admit date:  07/31/2012  Clinical Social Worker:  Orpah Greek  Date/time:  08/20/2012 09:34 AM  Clinical Social Work is seeking post-discharge placement for this patient at the following level of care:   SKILLED NURSING   (*CSW will update this form in Epic as items are completed)   08/19/2012  Patient/family provided with [facility] Department of Clinical Social Work's list of facilities offering this level of care within the geographic area requested by the patient (or if unable, by the patient's family).  08/19/2012  Patient/family informed of their freedom to choose among providers that offer the needed level of care, that participate in Medicare, Medicaid or managed care program needed by the patient, have an available bed and are willing to accept the patient.  08/19/2012  Patient/family informed of [facility]' ownership interest in [facility], as well as of the fact that they are under no obligation to receive care at this facility.  PASARR submitted to EDS on 08/19/2012 PASARR number received from EDS on 08/19/2012  FL2 transmitted to all facilities in geographic area requested by pt/family on  08/19/2012 FL2 transmitted to all facilities within larger geographic area on   Patient informed that his/her managed care company has contracts with or will negotiate with  certain facilities, including the following:   BCBS     Patient/family informed of bed offers received:  08/19/2012 Patient chooses bed at [facility] [place] Physician recommends and patient chooses bed at    Patient to  be transferred to [facility] [place] on  08/28/2012 Patient to be transferred to facility by patient's friend, Cliff's car  The following physician request were entered in Epic:   Additional Comments:   Unice Bailey, LCSW [facility] Clinical Social Worker cell #: [phone]

## 2012-08-28 NOTE — Progress Notes (Addendum)
Physical Therapy Treatment Patient Details Name: Jennifer [name] MRN: [id number] DOB: 1961/09/07 Today's Date: 08/28/2012 Time: 4098-1191 PT Time Calculation (min): 27 min  PT Assessment / Plan / Recommendation Comments on Treatment Session  Pt. has made a turn around today. Pt. is cheerful, engaging in conversation, has no c/o dizziness/nausea. Pt. was up at side of bed x 5 minutes prior to PT. Pt.was able to ambulate in hall with 1 person. VS monitored. see  VS section this note . RN speaking to MSW about SNF. Currently pt states she will have 24/ assistance. she does not want SNF. If pt. continues to be doing this well, SNF is not indicated. Recommend HHPT and 3-in-1. Pt declines RW.. recommend postop shoe(RN to order). No family present to confirm assistance at DC. Question is if pt.will continueto improve or have up and down BP issues.     Follow Up Recommendations  Home health PT;Supervision/Assistance - 24 hour     Does the patient have the potential to tolerate intense rehabilitation     Barriers to Discharge        Equipment Recommendations  3 in 1 bedside comode    Recommendations for Other Services    Frequency Min 3X/week   Plan Discharge plan needs to be updated;Frequency remains appropriate    Precautions / Restrictions Precautions Precautions: Fall Restrictions Weight Bearing Restrictions: Yes   Pertinent Vitals/Pain Sitting BP 118/75; HR 75 Standing 77/60  116 ( RN present and states he just gave IV meds for BP) pt with no dizziness. After ambulating 100 ft 94/58  After 2nd 100 ft. 96/71 HR 71 not nausea. No dizziness.    Mobility  Transfers Sit to Stand: 4: Min guard;From bed Stand to Sit: With armrests;To chair/3-in-1;With upper extremity assist;5: Supervision Ambulation/Gait Ambulation/Gait Assistance: 4: Min assist (handhold) Ambulation Distance (Feet): 100 Feet (x2) Assistive device: 1 person hand held assist Ambulation/Gait Assistance Details: Pt able  to stand x 3 minutes to get BP w/ supervision. no sway. Gait Pattern: Step-through pattern;Decreased stride length Gait velocity: decreased. General Gait Details: handhold steady assitance.    Exercises     PT Diagnosis:    PT Problem List:   PT Treatment Interventions:     PT Goals Acute Rehab PT Goals Pt will go Sit to Stand: with supervision PT Goal: Sit to Stand - Progress: Progressing toward goal Pt will go Stand to Sit: with supervision PT Goal: Stand to Sit - Progress: Progressing toward goal Pt will Ambulate: 51 - 150 feet;with supervision;with least restrictive assistive device PT Goal: Ambulate - Progress: Updated due to goal met  Visit Information  Last PT Received On: 08/28/12 Assistance Needed: +2 (safety)    Subjective Data  Subjective: I am better. I feel like dancing.   Cognition  Overall Cognitive Status: Appears within functional limits for tasks assessed/performed Arousal/Alertness: Awake/alert Orientation Level: Appears intact for tasks assessed Behavior During Session: [facility] for tasks performed Following Commands: Follows one step commands consistently    Balance  Static Standing Balance Static Standing - Balance Support: No upper extremity supported Static Standing - Level of Assistance: 5: Stand by assistance  End of Session PT - End of Session Activity Tolerance: Patient tolerated treatment well Patient left: in chair;with call bell/phone within reach Nurse Communication: Mobility status   GP     Rada Hay 08/28/2012, 2:47 PM

## 2012-08-28 NOTE — Progress Notes (Signed)
CM and Soc Worker Tresa Endo talked to patient about DCP; all questions answered. Patient stated " I was nervous about going there and I have never been through this before". Patient is agreeable to go to SNF short term before transitioning home. Abelino Derrick RN,BSN,MHA

## 2012-08-28 NOTE — Progress Notes (Signed)
Nutrition Follow-up  Intervention:  Continue po as tolerated.  Discontinue Resource as pt is not drinking it.  Encouraged small frequent meals as tolerated.  Assessment:   Pt had been having a decreased intake and vomiting due to gastroparesis.  Surgery seeing for consideration of a J-tube.  Pt's intake has improved with food from home over the last couple of days and at this time pt does not want to have the feeding tube placed.  Disliked Glucerna secondary to diarrhea.  Complains that Resource upsets her stomach.  Diet Order:  CHO MOD MED resource breeze bid  Meds: Scheduled Meds:   . feeding supplement  1 Container Oral BID BM  . fludrocortisone  0.1 mg Oral BID  . heparin  5,000 Units Subcutaneous Q8H  . influenza  inactive virus vaccine  0.5 mL Intramuscular Once  . insulin aspart  0-15 Units Subcutaneous TID WC  . metoCLOPramide (REGLAN) injection  10 mg Intravenous Q6H  . metoprolol  5 mg Intravenous Q6H  . midodrine  2.5 mg Oral TID WC  . multivitamin with minerals  1 tablet Oral Daily  . nortriptyline  10 mg Oral QHS  . pantoprazole (PROTONIX) IV  40 mg Intravenous Q12H  . pneumococcal 23 valent vaccine  0.5 mL Intramuscular Once  . potassium chloride  40 mEq Oral Q2H  . scopolamine  1 patch Transdermal Q72H  . senna-docusate  2 tablet Oral BID  . DISCONTD: erythromycin  250 mg Intravenous Q6H   CBG (last 3)   Basename 08/28/12 1114 08/28/12 0728 08/28/12 0258  GLUCAP 152* 136* 340*     Continuous Infusions:   . 0.9 % NaCl with KCl 40 mEq / L 50 mL/hr at 08/27/12 2256   PRN Meds:.alum & mag hydroxide-simeth, bisacodyl, hydrALAZINE, LORazepam, ondansetron (ZOFRAN) IV, polyethylene glycol, promethazine, zolpidem  Labs:  CMP     Component Value Date/Time   NA 135 08/28/2012 0505   K 3.2* 08/28/2012 0505   CL 99 08/28/2012 0505   CO2 27 08/28/2012 0505   GLUCOSE 167* 08/28/2012 0505   BUN 3* 08/28/2012 0505   CREATININE 0.80 08/28/2012 0505   CALCIUM 8.9  08/28/2012 0505   PROT 6.8 08/17/2012 0410   ALBUMIN 3.0* 08/17/2012 0410   AST 16 08/17/2012 0410   ALT 74* 08/17/2012 0410   ALKPHOS 105 08/17/2012 0410   BILITOT 0.7 08/17/2012 0410   GFRNONAA 85* 08/28/2012 0505   GFRAA >90 08/28/2012 0505     Intake/Output Summary (Last 24 hours) at 08/28/12 1319 Last data filed at 08/28/12 1000  Gross per 24 hour  Intake 1371.67 ml  Output      0 ml  Net 1371.67 ml    Weight Status:  70.9kg (9/19) no new weight  Re-estimated needs:  1750-1900 kcal, 70-85 gm protein  Nutrition Dx:  Inadequate oral intake-ongoing   Goal:  Pt to consume >90% meals-not met  Monitor:  Weight, po intake, nausea, labs, plan of care   Oran Rein, RD, LDN Clinical Inpatient Dietitian Pager:  [phone] Weekend and after hours pager:  [phone]

## 2012-08-28 NOTE — Discharge Summary (Signed)
Jennifer [name] MRN: [id number] DOB/AGE: 02/13/1961 51 y.o.  Admit date: 07/31/2012 Discharge date: 08/28/2012  Primary Care Physician:  No primary provider on file.   Discharge Diagnoses:   Patient Active Problem List  Diagnosis  . DM2 (diabetes mellitus, type 2)  . Diabetic foot ulcer  . Leukocytosis  . Cellulitis  . Hypokalemia  . Hypotension  . Elevated liver enzymes  . Nausea & vomiting  . Shock liver  . Gastroparesis due to DM  . Intractable nausea and vomiting  . Postoperative nausea and vomiting  . Hypertension  . Orthostatic hypotension    DISCHARGE MEDICATION:   Medication List     As of 08/28/2012 10:14 AM    STOP taking these medications         ciprofloxacin 500 MG tablet   Commonly known as: CIPRO      TAKE these medications         alum & mag hydroxide-simeth 200-200-20 MG/5ML suspension   Commonly known as: MAALOX/MYLANTA   Take 30 mLs by mouth as needed for indigestion.      amLODipine 10 MG tablet   Commonly known as: NORVASC   Take 1 tablet (10 mg total) by mouth daily.      bisacodyl 5 MG EC tablet   Commonly known as: DULCOLAX   Take 2 tablets (10 mg total) by mouth daily as needed.      fludrocortisone 0.1 MG tablet   Commonly known as: FLORINEF   Take 1 tablet (0.1 mg total) by mouth 2 (two) times daily.      insulin aspart 100 UNIT/ML injection   Commonly known as: novoLOG   Inject 0-15 Units into the skin 3 (three) times daily with meals.      insulin glargine 100 UNIT/ML injection   Commonly known as: LANTUS   Inject 25 Units into the skin 2 (two) times daily.      metoCLOPramide 5 MG tablet   Commonly known as: REGLAN   Take 2 tablets (10 mg total) by mouth 4 (four) times daily.      midodrine 2.5 MG tablet   Commonly known as: PROAMATINE   Take 1 tablet (2.5 mg total) by mouth 3 (three) times daily with meals.      pantoprazole 40 MG tablet   Commonly known as: PROTONIX   Take 1 tablet (40 mg total) by mouth daily.        promethazine 12.5 MG tablet   Commonly known as: PHENERGAN   Take 2 tablets (25 mg total) by mouth every 4 (four) hours as needed for nausea.      scopolamine 1.5 MG   Commonly known as: TRANSDERM-SCOP   Place 1 patch (1.5 mg total) onto the skin every 3 (three) days.      senna-docusate 8.6-50 MG per tablet   Commonly known as: Senokot-S   Take 2 tablets by mouth 2 (two) times daily.          Consults: Treatment Team:  Vertell Novak., MD   SIGNIFICANT DIAGNOSTIC STUDIES:  Nm Hepatobiliary  08/12/2012  *RADIOLOGY REPORT*  Clinical Data: Abdominal pain with nausea and vomiting.  NUCLEAR MEDICINE HEPATOHBILIARY INCLUDE GB  Radiopharmaceutical:  4.6 mCi technetium 67m Choletec IV  Comparison: Ultrasound dated 08/08/2012  Findings: The gallbladder is visualized at 20 minutes.  Activity is seen in the duodenum at 20 minutes.  IMPRESSION: Normal hepatobiliary scan.   Original Report Authenticated By: Gwynn Burly, M.D.  US Abdomen Complete  08/08/2012  *RADIOLOGY REPORT*  Clinical Data:  Hepatitis with elevated liver enzymes.  Nausea vomiting and diabetes  COMPLETE ABDOMINAL ULTRASOUND  Comparison:  CT 2010  Findings:  Gallbladder:  Given the patient's n.p.o. status, the gallbladder is mildly contracted. Some intraluminal sludge is identified which appears mobile with no definite stones.  The gallbladder wall appears slightly thickened measuring 4 mm. This can be spurious with partial contraction but a subtle tri- layered appearance suggests this may be due to wall edema. No pericholecystic fluid is seen.  Evaluation for a sonographic Murphy's sign is negative. No wall hyperemia is seen with color doppler.  Common bile duct:  Measures 3.3 mm.  No signs of intrahepatic ductal dilatation are evident  Liver:  Appears normal in echotexture with no focal parenchymal abnormality identified  IVC:  The proximal portion appears normal  Pancreas:  Appears normal in size and echotexture   Spleen:  Has a sagittal length of 6.4 cm with no focal parenchymal abnormalities seen  Right Kidney:  Demonstrates a sagittal length of 11.5 cm.  No focal parenchymal abnormality or signs of hydronephrosis is noted  Left Kidney:  Demonstrates a sagittal length of 12.2 cm.  No focal parenchymal abnormality or signs of hydronephrosis is noted. Fullness of the left renal pelvis is noted with no associated caliectasis.  A similar appearance was noted on prior CT from 2010 and may be the result of an extrarenal pelvis.  Abdominal aorta:  Has a maximal caliber of 2 cm with no aneurysmal dilatation see  IMPRESSION: Mild gallbladder wall thickening with subtle changes suspicious for wall edema. Sludge is present but no stones are visualized and the gallbladder appears mildly contracted. This may represent chronic change.  Acute acalculous cholecystitis is felt less likely given the lack of a positive sonographic Murphy's sign, gallbladder distention or pericholecystic fluid.  Wall edema can also be seen in the setting of third spacing from systemic disease and clinical correlation is recommended.  Probable left extrarenal pelvis.  These results will be called to the ordering clinician or representative by the Radiologist Assistant, and communication documented in the PACS Dashboard. These results will be called to the ordering clinician or representative by the Radiologist Assistant, and communication documented in the PACS Dashboard.   Original Report Authenticated By: Bertha Stakes, M.D.    Ct Abdomen Pelvis W Contrast  08/11/2012  *RADIOLOGY REPORT*  Clinical Data: Abdominal pain.  Nausea vomiting.  EGD earlier today.  Type 2 diabetic.  Elevated white blood cell count.  CT ABDOMEN AND PELVIS WITH CONTRAST  Technique:  Multidetector CT imaging of the abdomen and pelvis was performed following the standard protocol during bolus administration of intravenous contrast.  Contrast: OMNIPAQUE IOHEXOL 300 MG/ML   SOLN  Comparison: Plain films of 1 day prior ultrasound 08/08/2012.CT of 05/23/2009.  Findings: Mild bibasilar dependent atelectasis.  Mild cardiomegaly, without pericardial or pleural effusion.  A focal area of hyperenhancement within the hepatic dome measures 2.0 cm on image 12.  Not imaged on kidney delays.  Normal spleen, stomach, pancreas, gallbladder, biliary tract, adrenal glands.  There are numerous foci of increased density within the left renal collecting system.  There is mild caliectasis and prominence of the left extrarenal pelvis.  No left hydroureter is seen more distally.  Decreased corticomedullary differentiation bilaterally on portal venous phase images.  Slightly heterogeneous renal enhancement on delayed images.  Retroaortic left renal vein. No retroperitoneal or retrocrural adenopathy. Normal colon and  terminal ileum.  Normal appendix. Normal small bowel without abdominal ascites.  Right inguinal node measures 1.4 cm and is incompletely imaged. New.  Normal urinary bladder.  Uterine fibroids. No adnexal mass or significant free fluid.  Injection sites in the anterior abdominal wall. No acute osseous abnormality.  IMPRESSION:  1.  No definite explanation for patient's pain, nausea, and vomiting. 2. Early contrast excretion from the left kidney.  Concurrent collecting system stones are difficult to exclude but felt less likely. 3.  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  Given unenhanced technique on 05/23/2009, felt to be similar.  4.  Heterogeneous enhancement of the kidneys bilaterally.  This is a subtle finding and nonspecific.  Considerations would include a component subacute renal failure (i.e. diabetic nephropathy) or less likely pyelonephritis. 5.  Hyperenhancing focus within the hepatic dome.  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  Outpatient non emergent pre post contrast abdominal MRI could confirm. 6.  Right inguinal adenopathy, mild.   Favored to be reactive.  If there is any history of primary malignancy, consider repeat CT in 3 months.   Original Report Authenticated By: Consuello Bossier, M.D.    Mr Foot Right Wo Contrast  08/01/2012  *RADIOLOGY REPORT*  Clinical Data: Medial plantar forefoot ulceration.  History of diabetes.  Evaluate for osteomyelitis.  MRI OF THE RIGHT FOREFOOT WITHOUT CONTRAST  Technique:  Multiplanar, multisequence MR imaging was performed. No intravenous contrast was administered.  Comparison: Radiographs 07/31/2012.  Findings: There appears to be some soft tissue ulceration plantar to the first metatarsal phalangeal joint.  Underlying plantar subcutaneous edema is asymmetric medially.  No focal fluid collection is evident on noncontrast imaging.  There is more extensive diffuse dorsal subcutaneous edema and ill-defined fluid. There is focal intermetatarsal fluid within the first webspace.  No significant fluid is seen within the flexor or extensor tendon sheaths.  No significant metatarsal phalangeal joint effusions are seen.  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  Mild degenerative changes are present at the first metatarsal phalangeal joint.  There is no evidence of acute fracture or bone destruction.  No foreign bodies are identified.  IMPRESSION:  1.  No evidence of focal forefoot soft tissue abscess.  There is mild ulceration plantar to the first metatarsal phalangeal joint. 2.  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  Myositis not excluded. 3.  No evidence of osteomyelitis.   Original Report Authenticated By: Gerrianne Scale, M.D.    Dg Abd Acute W/chest  08/18/2012  *RADIOLOGY REPORT*  Clinical Data: Abdominal pain with nausea and vomiting.  Evaluate for obstruction or constipation.  ACUTE ABDOMEN SERIES (ABDOMEN 2 VIEW & CHEST 1 VIEW)  Comparison: Acute abdominal series  08/02/2012.  Abdominal CT 08/11/2012.  Findings: The heart size and mediastinal contours are stable.  The lungs are clear.  There is no pleural effusion.  Some high-density particulate matter is again noted within the colon.  There is no evidence of bowel obstruction or free intraperitoneal air.  Pelvic phleboliths are unchanged.  The osseous structures are stable.  IMPRESSION: Stable examination.  No evidence of bowel obstruction.   Original Report Authenticated By: Gerrianne Scale, M.D.    Dg Abd Acute W/chest  08/10/2012  *RADIOLOGY REPORT*  Clinical Data: Nausea/vomiting, evaluate for ileus  ACUTE ABDOMEN SERIES (ABDOMEN 2 VIEW & CHEST 1 VIEW)  Comparison: [facility] CT abdomen pelvis  dated 05/23/2009  Findings: Lungs are clear. No pleural effusion or pneumothorax.  Cardiomediastinal silhouette is within normal limits.  Nonobstructive bowel gas pattern.  No findings to suggest ileus. No evidence of free air under the diaphragm on the upright view.  Visualized osseous structures are within normal limits.  IMPRESSION: No evidence of acute cardiopulmonary disease.  No evidence of bowel obstruction or ileus.  No free air.   Original Report Authenticated By: Charline Bills, M.D.    Dg Abd Portable 1v  08/12/2012  *RADIOLOGY REPORT*  Clinical Data: Severe nausea and vomiting.  PORTABLE ABDOMEN - 1 VIEW  Comparison: CT scan of the abdomen dated 08/11/2012 and radiographs dated 08/10/2012  Findings: The bowel gas pattern is normal.  There are multiple small radiodensities in the stool in the colon, ingested material of some sort.  No dilated bowel.  No osseous abnormality.  IMPRESSION: Benign-appearing abdomen. No significant movement of stool since 08/10/2012.  No change since the prior exam.   Original Report Authenticated By: Gwynn Burly, M.D.    Dg Foot Complete Right  07/31/2012  *RADIOLOGY REPORT*  Clinical Data: Ulceration on the plantar surface of the foot.  RIGHT FOOT COMPLETE - 3+ VIEW   Comparison: None.  Findings: Soft tissues of the foot are diffusely swollen.  Plantar skin ulcer is identified.  No bony destructive change, radiopaque foreign body or soft tissue gas collection is identified.  There is no fracture or dislocation.  IMPRESSION: Soft tissue swelling and skin ulceration without evidence of osteomyelitis.   Original Report Authenticated By: Bernadene Bell. D'ALESSIO, M.D.     ECHO:   Left ventricle: The cavity size was normal. There was mild concentric hypertrophy. Systolic function was normal. The estimated ejection fraction was in the range of 60% to 65%. Wall motion was normal; there were no regional wall motion abnormalities. - Right ventricle: The cavity size was normal. Wall thickness was mildly increased. - Atrial septum: No defect or patent foramen ovale was identified. - Pericardium, extracardiac: A very small predominently anterior pericardial effusion was identified. The fluid had no internal echoes.    OTHER PROCEDURES/ Endoscopy: FINDINGS: The endoscope was inserted into the oropharynx and  esophagus was intubated. The gastroesophageal junction was noted  to be 40 cm from the incisors. Endoscope was advanced into the  stomach,which revealed normal-appearing gastric mucosa. The  endoscope was advanced to the duodenal bulb and second portion of  duodenum which were unremarkable. The endoscope was withdrawn back  into the stomach and retroflexion revealed a normal proximal  stomach.  COMPLICATIONS:None  ENDOSCOPIC IMPRESSION: Normal EGD     BRIEF ADMITTING H & P: Jennifer [name] is a 51 y.o. female with PMH of DM2 diagnosed 13 years ago (does not know her A1C), who tried to cut what she thought was a callus off of her foot 2 weeks ago. She thought nothing of it until 2 days ago when her foot and ankle began to swell up. Finally today she noticed drainage through her sock and a very foul smell when she took her sock off. This prompted the patient to  present to the ED for treatment.  Evaluation in the ED demonstrated a non painful diabetic foot ulcer with purulent drainage and obvious edema of leg and erythema around ulcer. Lab work up showed leukocytosis, X-ray of foot didn't show any obvious osteomylitis, patient started on zosyn hospitalist has been asked to admit for IV antibiotics      Hospital Course:  Present  on Admission:  .Diabetic foot ulcer/Cellulitis:Pt is a 51 y.o. female with PMH of DM2 diagnosed 13 years ago, who tried to cut what she thought was a callus off of her foot 2 weeks PTA. Two days prior to admission she began to have swelling of her foot and ankle. She then noticed drainage through her sock and a very foul smell when she took her sock off. This prompted the patient to present to the ED for treatment. Evaluation in the ED demonstrated a non painful diabetic foot ulcer with purulent drainage and obvious edema of leg and erythema around ulcer. Lab work up showed leukocytosis, X-ray of foot didn't show any obvious osteomylitis, patient started on zosyn hospitalist has been asked to admit for IV antibiotics. Pt was seen by orthopedic surgery and had an incision and drainage done at the bedside. The patient was initially treated vancomycin and Zosyn. However without any evidence of staph aureus on the cultures the spectrum was narrowed to Zosyn and the patient received a total of 10 days of Zosyn. Currently the course of antibiotic has been completed. .Orthostatic hypotension:She also has been very orthostatic. An echocardiogram to evaluate left ventricular function was unremarkable. The patient received fluid resuscitation and was then started on Florinef and midodrine. At the time of discharge patient's blood pressure is mildly elevated at 149/89. Once the patient is the ambulatory setting, she can be given to weaning the midodrine if BP remains stable.  . Nausea and vomiting: Patient had nausea and vomiting during her  hospitalization which was felt to be secondary to diabetic gastroparesis. She was evaluated by gastroenterology who had no significant recommendations beyond Reglan. She also was considered for jejunostomy tube. The patient refused and stated that why she could not tolerate other foods it was compared hospital she had been and continues to be able to eat food from home. The patient is continued on Reglan for gastroparesis. At this time she is tolerating her meals without any difficulty.  .DM2 (diabetes mellitus, type 2): Patient has a hemoglobin A1c of 6.7 which reflects relatively good control.  . Transaminitis: The patient had elevated liver enzymes which is felt to be secondary to shock liver associated with her hypertension. Acute hepatitis panel was found to be negative. She seen by gastroenterology who recommended a HIDA scan which was also negative. Her liver enzymes have now normalized appear    .Hypokalemia: Replaced    Disposition and Follow-up:  Patient is being discharged to skilled nursing facility secondary to a normal gait and deconditioning. She is to followup with the public health clinic in [place] at the time of her discharge from skilled facility.     Discharge Orders    Future Orders Please Complete By Expires   Diet Carb Modified      Increase activity slowly      Discharge wound care:      Comments:   Twice a day dressing changes with-Vasaline guaze with moist 4 x 4 then wrapped with Kerlix.      DISCHARGE EXAM:  General: Alert, awake, oriented x3, in no acute distress.  Vital Signs: Blood pressure 149/89, pulse 72, temperature 98.3 F (36.8 C), temperature source Oral, resp. rate 19, height 5\' 8"  (1.727 m), weight 70.9 kg (156 lb 4.9 oz), last menstrual period 02/09/2011, SpO2 99.00%. HEENT: [place]/AT PEERL, EOMI  Neck: Trachea midline, no masses, no thyromegal,y no JVD, no carotid bruit  OROPHARYNX: Moist, No exudate/ erythema/lesions.  Heart: Regular rate and  rhythm, without  murmurs, rubs, gallops, PMI non-displaced, no heaves or thrills on palpation.  Lungs: Clear to auscultation, no wheezing or rhonchi noted. No increased vocal fremitus resonant to percussion  Abdomen: Soft, nontender, nondistended, positive bowel sounds, no masses no hepatosplenomegaly noted..  Neuro: No focal neurological deficits noted  Musculoskeletal: No warm swelling or erythema around joints, no spinal tenderness noted.  Skin: Surgical wound clean and without signs of infection. Psychiatric: Patient alert and oriented x3, good insight and cognition, good recent to remote recall.     Basename 08/28/12 0505 08/27/12 0416  NA 135 135  K 3.2* 4.2  CL 99 101  CO2 27 24  GLUCOSE 167* 174*  BUN 3* 5*  CREATININE 0.80 0.79  CALCIUM 8.9 9.1  MG -- --  PHOS -- --   No results found for this basename: AST:2,ALT:2,ALKPHOS:2,BILITOT:2,PROT:2,ALBUMIN:2 in the last 72 hours No results found for this basename: LIPASE:2,AMYLASE:2 in the last 72 hours  Basename 08/28/12 0505  WBC 4.9  NEUTROABS --  HGB 10.1*  HCT 29.4*  MCV 89.1  PLT 217  Total time for discharge process including decision-making face-to-face time greater than 30 minutes Signed: MATTHEWS,MICHELLE A. 08/28/2012, 10:14 AM

## 2012-09-02 ENCOUNTER — Encounter (HOSPITAL_COMMUNITY): Payer: Self-pay | Admitting: Emergency Medicine

## 2013-02-18 ENCOUNTER — Encounter: Payer: Self-pay | Admitting: Nurse Practitioner

## 2013-02-18 ENCOUNTER — Ambulatory Visit (INDEPENDENT_AMBULATORY_CARE_PROVIDER_SITE_OTHER): Payer: BC Managed Care – PPO | Admitting: Nurse Practitioner

## 2013-02-18 VITALS — BP 136/88 | HR 90 | Temp 98.2°F | Resp 16 | Ht 66.5 in | Wt 137.0 lb

## 2013-02-18 DIAGNOSIS — L600 Ingrowing nail: Secondary | ICD-10-CM

## 2013-02-18 MED ORDER — SACCHAROMYCES BOULARDII 250 MG PO CAPS: 250.0000 mg | ORAL_CAPSULE | Freq: Two times a day (BID) | ORAL | Status: DC

## 2013-02-18 MED ORDER — DOXYCYCLINE HYCLATE 100 MG PO TABS: 100.0000 mg | ORAL_TABLET | Freq: Two times a day (BID) | ORAL | Status: DC

## 2013-02-18 NOTE — Patient Instructions (Signed)
    Infected Ingrown Toenail  An infected ingrown toenail occurs when the nail edge grows into the skin and bacteria invade the area. Symptoms include pain, tenderness, swelling, and pus drainage from the edge of the nail. Poorly fitting shoes, minor injuries, and improper cutting of the toenail may also contribute to the problem. You should cut your toenails squarely instead of rounding the edges. Do not cut them too short. Avoid tight or pointed toe shoes. Sometimes the ingrown portion of the nail must be removed. If your toenail is removed, it can take 3-4 months for it to re-grow.  HOME CARE INSTRUCTIONS    Soak your infected toe in warm water for 20-30 minutes, 2 to 3 times a day.   Packing or dressings applied to the area should be changed daily.   Take medicine as directed and finish them.   Reduce activities and keep your foot elevated when able to reduce swelling and discomfort. Do this until the infection gets better.   Wear sandals or go barefoot as much as possible while the infected area is sensitive.   See your caregiver for follow-up care in 2-3 days if the infection is not better.  SEEK MEDICAL CARE IF:   Your toe is becoming more red, swollen or painful.  MAKE SURE YOU:    Understand these instructions.   Will watch your condition.   Will get help right away if you are not doing well or get worse.  Document Released: 12/07/2004 Document Revised: 01/22/2012 Document Reviewed: 10/26/2008  [employer] Patient Information 2013 [employer]

## 2013-02-18 NOTE — Progress Notes (Signed)
Patient ID: Jennifer [name], female   DOB: 26-Apr-1961, 52 y.o.   MRN: [id number]   No Known Allergies  Chief Complaint  Patient presents with  . Ingrown Toenail    left foot    HPI: Patient is a 52 y.o. female seen in the office today for evaluation of great toe on left foot For about 2 weeks she has noticed increased swelling and some bleeding. Now reports draining pus.  No pain but uncomfortable.   No fevers or chills  Review of Systems:  Review of Systems  Constitutional: Negative for fever and chills.  Cardiovascular: Negative for chest pain and palpitations.  Musculoskeletal: Positive for myalgias (tenderness to left great toe near toenail).  Skin:       Swollen red left great toe      Past Medical History  Diagnosis Date  . Diabetes mellitus   . Ulcer of lower limb, unspecified     ulcer on top of foot (right)   Past Surgical History  Procedure Laterality Date  . Esophagogastroduodenoscopy  08/11/2012    Procedure: ESOPHAGOGASTRODUODENOSCOPY (EGD);  Surgeon: Shirley Friar, MD;  Location: [facility];  Service: Endoscopy;  Laterality: N/A;  . Tubal ligation     Social History:   reports that she has never smoked. She does not have any smokeless tobacco history on file. She reports that she does not drink alcohol or use illicit drugs.  Family History  Problem Relation Age of Onset  . Diabetes Mother     Medications: Patient's Medications  New Prescriptions   DOXYCYCLINE (VIBRA-TABS) 100 MG TABLET    Take 1 tablet (100 mg total) by mouth 2 (two) times daily.   SACCHAROMYCES BOULARDII (FLORASTOR) 250 MG CAPSULE    Take 1 capsule (250 mg total) by mouth 2 (two) times daily.  Previous Medications   FLUDROCORTISONE (FLORINEF) 0.1 MG TABLET    Take 1 tablet (0.1 mg total) by mouth 2 (two) times daily.   INSULIN ASPART (NOVOLOG) 100 UNIT/ML INJECTION    Inject 0-15 Units into the skin 3 (three) times daily with meals.   INSULIN GLARGINE (LANTUS) 100 UNIT/ML  INJECTION    Inject 25 Units into the skin 2 (two) times daily.    METFORMIN (GLUCOPHAGE) 500 MG TABLET    Take 500 mg by mouth daily.   METOCLOPRAMIDE (REGLAN) 5 MG TABLET    Take 2 tablets (10 mg total) by mouth 4 (four) times daily.   PANTOPRAZOLE (PROTONIX) 40 MG TABLET    Take 1 tablet (40 mg total) by mouth daily.   PROMETHAZINE (PHENERGAN) 12.5 MG TABLET    Take 2 tablets (25 mg total) by mouth every 4 (four) hours as needed for nausea.  Modified Medications   No medications on file  Discontinued Medications   ALUM & MAG HYDROXIDE-SIMETH (MAALOX/MYLANTA) 200-200-20 MG/5ML SUSPENSION    Take 30 mLs by mouth as needed for indigestion.   AMLODIPINE (NORVASC) 10 MG TABLET    Take 1 tablet (10 mg total) by mouth daily.   BISACODYL (DULCOLAX) 5 MG EC TABLET    Take 2 tablets (10 mg total) by mouth daily as needed.   MIDODRINE (PROAMATINE) 2.5 MG TABLET    Take 1 tablet (2.5 mg total) by mouth 3 (three) times daily with meals.   SCOPOLAMINE (TRANSDERM-SCOP) 1.5 MG    Place 1 patch (1.5 mg total) onto the skin every 3 (three) days.   SENNA-DOCUSATE (SENOKOT-S) 8.6-50 MG PER TABLET    Take 2  tablets by mouth 2 (two) times daily.     Physical Exam:  Filed Vitals:   02/18/13 1536  BP: 136/88  Pulse: 90  Temp: 98.2 F (36.8 C)  TempSrc: Oral  Resp: 16  Height: 5' 6.5" (1.689 m)  Weight: 137 lb (62.143 kg)  SpO2: 95%   Physical Exam  Constitutional: She is oriented to person, place, and time. She appears well-developed and well-nourished. No distress.  Cardiovascular: Normal rate and regular rhythm.   Pulmonary/Chest: Effort normal and breath sounds normal.  Musculoskeletal: Normal range of motion.  Neurological: She is alert and oriented to person, place, and time.  Skin: Skin is warm and dry. She is not diaphoretic. There is erythema (to left great toe- edema, erythema, purlent drainage. decearsed sensation by pinprict).  Psychiatric: She has a normal mood and affect.       Assessment/Plan Ingrown left big toenail With infections. Will get pt set up with podiatry asap. Will order doxycycline BID for 10 days with warm episome salt foot soaks 2-3 times daily

## 2013-02-24 NOTE — Assessment & Plan Note (Addendum)
With infections. Will get pt set up with podiatry asap. Will order doxycycline BID for 10 days with warm episome salt foot soaks 2-3 times daily

## 2013-03-05 ENCOUNTER — Encounter: Payer: Self-pay | Admitting: Gastroenterology

## 2013-04-21 DIAGNOSIS — Z029 Encounter for administrative examinations, unspecified: Secondary | ICD-10-CM

## 2013-05-12 ENCOUNTER — Encounter: Payer: Self-pay | Admitting: *Deleted

## 2013-05-13 ENCOUNTER — Encounter: Payer: Self-pay | Admitting: *Deleted

## 2013-05-14 ENCOUNTER — Encounter: Payer: Self-pay | Admitting: Nurse Practitioner

## 2013-05-14 ENCOUNTER — Ambulatory Visit (INDEPENDENT_AMBULATORY_CARE_PROVIDER_SITE_OTHER): Payer: BC Managed Care – PPO | Admitting: Nurse Practitioner

## 2013-05-14 VITALS — BP 112/74 | HR 82 | Temp 99.2°F | Resp 14 | Ht 66.5 in | Wt 137.8 lb

## 2013-05-14 DIAGNOSIS — R197 Diarrhea, unspecified: Secondary | ICD-10-CM

## 2013-05-14 DIAGNOSIS — E119 Type 2 diabetes mellitus without complications: Secondary | ICD-10-CM

## 2013-05-14 MED ORDER — INSULIN GLARGINE 100 UNIT/ML ~~LOC~~ SOLN: 25.0000 [IU] | Freq: Two times a day (BID) | SUBCUTANEOUS | Status: DC

## 2013-05-14 MED ORDER — INSULIN ASPART 100 UNIT/ML ~~LOC~~ SOLN: 0.0000 [IU] | Freq: Three times a day (TID) | SUBCUTANEOUS | Status: DC

## 2013-05-14 NOTE — Progress Notes (Signed)
Patient ID: Jennifer [name], female   DOB: 10-04-1961, 52 y.o.   MRN: [id number]   No Known Allergies  Chief Complaint  Patient presents with  . Follow-up    insulin refill, pt states that diarrhea is severe and wants help today.    HPI: Patient is a 52 y.o. female seen in the office today for diarrhea Has been having dirrhea for 4 months without any resolution. Take 3 imodium a day  She can not eat without having severe diarrhea Reports it hurts for a second and then she has uncontrolled diarrhea; having to wear depends and it with pour around depends Very foul odor.   Also needs refill on insulins  Review of Systems:  Review of Systems  Constitutional: Negative for fever, chills, weight loss and malaise/fatigue.  Respiratory: Negative for shortness of breath.   Cardiovascular: Negative for chest pain and palpitations.  Gastrointestinal: Negative for nausea, vomiting, abdominal pain, diarrhea and constipation.  Genitourinary: Negative for dysuria and frequency.  Neurological: Negative for dizziness, weakness and headaches.     Past Medical History  Diagnosis Date  . Diabetes mellitus   . Ulcer of lower limb, unspecified     ulcer on top of foot (right)  . Slow transit constipation   . Disorder of bone and cartilage, unspecified   . Unspecified essential hypertension   . Orthostatic hypotension   . Reflux esophagitis   . Gastroparesis   . Irritable bowel syndrome   . Flatulence, eructation, and gas pain    Past Surgical History  Procedure Laterality Date  . Esophagogastroduodenoscopy  08/11/2012    Procedure: ESOPHAGOGASTRODUODENOSCOPY (EGD);  Surgeon: Shirley Friar, MD;  Location: [facility];  Service: Endoscopy;  Laterality: N/A;  . Tubal ligation     Social History:   reports that she has never smoked. She does not have any smokeless tobacco history on file. She reports that she does not drink alcohol or use illicit drugs.  Family History  Problem Relation  Age of Onset  . Diabetes Mother   . Alzheimer's disease Father     Medications: Patient's Medications  New Prescriptions   No medications on file  Previous Medications   CLOTRIMAZOLE-BETAMETHASONE (LOTRISONE) CREAM    Apply 1 application topically 2 (two) times daily. Apply to feet twice daily for fungus.   DOXYCYCLINE (VIBRA-TABS) 100 MG TABLET    Take 1 tablet (100 mg total) by mouth 2 (two) times daily.   FLUDROCORTISONE (FLORINEF) 0.1 MG TABLET    Take 1 tablet (0.1 mg total) by mouth 2 (two) times daily.   INSULIN ASPART (NOVOLOG) 100 UNIT/ML INJECTION    Inject 0-15 Units into the skin 3 (three) times daily with meals.   INSULIN GLARGINE (LANTUS) 100 UNIT/ML INJECTION    Inject 25 Units into the skin 2 (two) times daily. Inject 25 units of insulin subcutaneous twice daily.   METFORMIN (GLUCOPHAGE) 500 MG TABLET    Take 500 mg by mouth daily. Take 1 tablet each morning to control diabetes.   METOCLOPRAMIDE (REGLAN) 5 MG TABLET    Take 2 tablets (10 mg total) by mouth 4 (four) times daily.   PANTOPRAZOLE (PROTONIX) 40 MG TABLET    Take 1 tablet (40 mg total) by mouth daily.   PROMETHAZINE (PHENERGAN) 12.5 MG TABLET    Take 2 tablets (25 mg total) by mouth every 4 (four) hours as needed for nausea.   SACCHAROMYCES BOULARDII (FLORASTOR) 250 MG CAPSULE    Take 1 capsule (250  mg total) by mouth 2 (two) times daily.   SCOPOLAMINE (TRANSDERM-SCOP) 1.5 MG    Place 1 patch onto the skin every 3 (three) days. Apply 1 patch transdermal every 72 hours for nausea.   SIMETHICONE 125 MG CAPS    Take 125 mg by mouth daily. Take 1 capsule daily as needed for gas.  Modified Medications   No medications on file  Discontinued Medications   No medications on file     Physical Exam:  Filed Vitals:   05/14/13 1350  BP: 112/74  Pulse: 82  Temp: 99.2 F (37.3 C)  TempSrc: Oral  Resp: 14  Height: 5' 6.5" (1.689 m)  Weight: 137 lb 12.8 oz (62.506 kg)  SpO2: 96%    Physical Exam   Constitutional: She is oriented to person, place, and time and well-developed, well-nourished, and in no distress. No distress.  Cardiovascular: Normal rate, regular rhythm and normal heart sounds.   Pulmonary/Chest: Effort normal and breath sounds normal.  Abdominal: Soft. Bowel sounds are normal. She exhibits no distension and no mass. There is no tenderness.  Genitourinary:  Declined rectal exam  Musculoskeletal: Normal range of motion. She exhibits no edema and no tenderness.  Neurological: She is alert and oriented to person, place, and time.  Skin: Skin is warm and dry. She is not diaphoretic.     Labs reviewed: Basic Metabolic Panel:  Recent Labs  16/10/96 0403 08/14/12 0335  08/26/12 0405 08/27/12 0416 08/28/12 0505  NA 137 137  < > 138 135 135  K 3.6 3.2*  < > 3.2* 4.2 3.2*  CL 101 101  < > 101 101 99  CO2 27 27  < > 28 24 27   GLUCOSE 195* 100*  < > 183* 174* 167*  BUN 6 12  < > 5* 5* 3*  CREATININE 1.00 0.84  < > 0.85 0.79 0.80  CALCIUM 9.0 9.4  < > 9.2 9.1 8.9  MG  --  1.8  --   --   --  1.6  < > = values in this interval not displayed. Liver Function Tests:  Recent Labs  08/14/12 0335 08/16/12 0330 08/17/12 0410  AST 19 17 16   ALT 156* 98* 74*  ALKPHOS 119* 115 105  BILITOT 0.4 0.6 0.7  PROT 7.0 7.1 6.8  ALBUMIN 2.9* 3.0* 3.0*   No results found for this basename: LIPASE, AMYLASE,  in the last 8760 hours No results found for this basename: AMMONIA,  in the last 8760 hours CBC:  Recent Labs  07/31/12 2230  08/09/12 0345  08/17/12 0410 08/18/12 0445 08/28/12 0505  WBC 14.6*  < > 14.2*  < > 8.4 7.3 4.9  NEUTROABS 10.2*  --  10.4*  --   --   --   --   HGB 9.8*  < > 9.1*  < > 10.6* 10.3* 10.1*  HCT 28.3*  < > 27.8*  < > 31.8* 31.1* 29.4*  MCV 89.0  < > 90.8  < > 89.8 90.7 89.1  PLT 306  < > 377  < > 470* 401* 217  < > = values in this interval not displayed. Lipid Panel: No results found for this basename: CHOL, HDL, LDLCALC, TRIG, CHOLHDL,  LDLDIRECT,  in the last 8760 hours  Past Procedures:     Assessment/Plan  1.    DM2 (diabetes mellitus, type 2) 250.00  Pt needs refill on insulins- however she reports she is only taking 5 units of novolog with  meals   And is not taking lanuts--- pt was previously taking lantus 25 units twice daily (will dc this from  the medication list since she is not currently taking however I suspect she may need lantus  restarted at next visit)  Cont novolog  Pt is also on metformin- will have her stop this due to diarrhea  Pt to take blood sugar twice daily and bring log to next visit    2.   Diarrhea   Will get lab work during today's visit and stool samples will be needed   To add metamucil or benefiber to bulk stools; will stop metformin due to potential cause of  diarrhea   Labs/tests ordered CMP, CBC with diff, amylase, lipase, TSH

## 2013-05-14 NOTE — Patient Instructions (Addendum)
To get lab work  Make sure you are not taking REGLAN  Stop taking metformin  To take metamucil or benefiber daily (this will add bulk to your stool)    Diarrhea Diarrhea is frequent loose and watery bowel movements. It can cause you to feel weak and dehydrated. Dehydration can cause you to become tired and thirsty, have a dry mouth, and have decreased urination that often is dark yellow. Diarrhea is a sign of another problem, most often an infection that will not last long. In most cases, diarrhea typically lasts 2 3 days. However, it can last longer if it is a sign of something more serious. It is important to treat your diarrhea as directed by your caregive to lessen or prevent future episodes of diarrhea. CAUSES  Some common causes include:  Gastrointestinal infections caused by viruses, bacteria, or parasites.  Food poisoning or food allergies.  Certain medicines, such as antibiotics, chemotherapy, and laxatives.  Artificial sweeteners and fructose.  Digestive disorders. HOME CARE INSTRUCTIONS  Ensure adequate fluid intake (hydration): have 1 cup (8 oz) of fluid for each diarrhea episode. Avoid fluids that contain simple sugars or sports drinks, fruit juices, whole milk products, and sodas. Your urine should be clear or pale yellow if you are drinking enough fluids. Hydrate with an oral rehydration solution that you can purchase at pharmacies, retail stores, and online. You can prepare an oral rehydration solution at home by mixing the following ingredients together:    tsp table salt.   tsp baking soda.   tsp salt substitute containing potassium chloride.  1  tablespoons sugar.  1 L (34 oz) of water.  Certain foods and beverages may increase the speed at which food moves through the gastrointestinal (GI) tract. These foods and beverages should be avoided and include:  Caffeinated and alcoholic beverages.  High-fiber foods, such as raw fruits and vegetables, nuts, seeds,  and whole grain breads and cereals.  Foods and beverages sweetened with sugar alcohols, such as xylitol, sorbitol, and mannitol.  Some foods may be well tolerated and may help thicken stool including:  Starchy foods, such as rice, toast, pasta, low-sugar cereal, oatmeal, grits, baked potatoes, crackers, and bagels.  Bananas.  Applesauce.  Add probiotic-rich foods to help increase healthy bacteria in the GI tract, such as yogurt and fermented milk products.  Wash your hands well after each diarrhea episode.  Only take over-the-counter or prescription medicines as directed by your caregiver.  Take a warm bath to relieve any burning or pain from frequent diarrhea episodes. SEEK IMMEDIATE MEDICAL CARE IF:   You are unable to keep fluids down.  You have persistent vomiting.  You have blood in your stool, or your stools are black and tarry.  You do not urinate in 6 8 hours, or there is only a small amount of very dark urine.  You have abdominal pain that increases or localizes.  You have weakness, dizziness, confusion, or lightheadedness.  You have a severe headache.  Your diarrhea gets worse or does not get better.  You have a fever or persistent symptoms for more than 2 3 days.  You have a fever and your symptoms suddenly get worse. MAKE SURE YOU:   Understand these instructions.  Will watch your condition.  Will get help right away if you are not doing well or get worse. Document Released: 10/20/2002 Document Revised: 10/16/2012 Document Reviewed: 07/07/2012 [facility] Patient Information 2014 [place], [place].

## 2013-05-15 ENCOUNTER — Other Ambulatory Visit: Payer: BC Managed Care – PPO

## 2013-05-15 ENCOUNTER — Other Ambulatory Visit: Payer: Self-pay | Admitting: Geriatric Medicine

## 2013-05-15 ENCOUNTER — Other Ambulatory Visit: Payer: Self-pay | Admitting: Nurse Practitioner

## 2013-05-15 DIAGNOSIS — R197 Diarrhea, unspecified: Secondary | ICD-10-CM

## 2013-05-15 LAB — COMPREHENSIVE METABOLIC PANEL
Albumin: 4.4 g/dL (ref 3.5–5.5)
Alkaline Phosphatase: 77 IU/L (ref 39–117)
BUN/Creatinine Ratio: 13 (ref 9–23)
BUN: 15 mg/dL (ref 6–24)
CO2: 24 mmol/L (ref 18–29)
Calcium: 9.7 mg/dL (ref 8.7–10.2)
Chloride: 99 mmol/L (ref 97–108)
Creatinine, Ser: 1.15 mg/dL — ABNORMAL HIGH (ref 0.57–1.00)
Globulin, Total: 2.4 g/dL (ref 1.5–4.5)
Glucose: 239 mg/dL — ABNORMAL HIGH (ref 65–99)

## 2013-05-15 LAB — CBC WITH DIFFERENTIAL
Basophils Absolute: 0 10*3/uL (ref 0.0–0.2)
Eos: 1 % (ref 0–5)
Immature Grans (Abs): 0 10*3/uL (ref 0.0–0.1)
Immature Granulocytes: 0 % (ref 0–2)
Lymphocytes Absolute: 2.3 10*3/uL (ref 0.7–3.1)
Lymphs: 50 % — ABNORMAL HIGH (ref 14–46)
MCV: 95 fL (ref 79–97)
Monocytes: 5 % (ref 4–12)
Neutrophils Relative %: 44 % (ref 40–74)
Platelets: 262 10*3/uL (ref 150–379)
RBC: 4.06 x10E6/uL (ref 3.77–5.28)
RDW: 12.3 % (ref 12.3–15.4)
WBC: 4.7 10*3/uL (ref 3.4–10.8)

## 2013-05-15 LAB — LIPASE: Lipase: 22 U/L (ref 0–59)

## 2013-05-15 LAB — AMYLASE: Amylase: 52 U/L (ref 31–124)

## 2013-05-20 ENCOUNTER — Other Ambulatory Visit: Payer: Self-pay | Admitting: Geriatric Medicine

## 2013-05-20 DIAGNOSIS — E119 Type 2 diabetes mellitus without complications: Secondary | ICD-10-CM

## 2013-05-20 LAB — FECAL FAT, QUALITATIVE: Fat Qual Neutral, Stl: NORMAL

## 2013-05-20 LAB — OVA AND PARASITE EXAMINATION

## 2013-05-20 LAB — STOOL CULTURE: E coli, Shiga toxin Assay: NEGATIVE

## 2013-05-20 MED ORDER — INSULIN ASPART 100 UNIT/ML ~~LOC~~ SOLN: 0.0000 [IU] | Freq: Three times a day (TID) | SUBCUTANEOUS | Status: DC

## 2013-05-20 NOTE — Addendum Note (Signed)
Addended by: Claudie Revering on: 05/20/2013 11:19 AM   Modules accepted: Orders, Medications

## 2013-05-26 ENCOUNTER — Encounter: Payer: Self-pay | Admitting: *Deleted

## 2013-05-27 ENCOUNTER — Ambulatory Visit: Payer: BC Managed Care – PPO | Admitting: Internal Medicine

## 2013-06-03 ENCOUNTER — Ambulatory Visit: Payer: BC Managed Care – PPO | Admitting: Internal Medicine

## 2013-06-03 DIAGNOSIS — Z0289 Encounter for other administrative examinations: Secondary | ICD-10-CM

## 2013-06-18 ENCOUNTER — Ambulatory Visit (INDEPENDENT_AMBULATORY_CARE_PROVIDER_SITE_OTHER): Payer: BC Managed Care – PPO | Admitting: Internal Medicine

## 2013-06-18 ENCOUNTER — Encounter: Payer: Self-pay | Admitting: Internal Medicine

## 2013-06-18 ENCOUNTER — Ambulatory Visit
Admission: RE | Admit: 2013-06-18 | Discharge: 2013-06-18 | Disposition: A | Discharge status: Home / Self Care | Payer: BC Managed Care – PPO | Source: Ambulatory Visit | Attending: Internal Medicine | Admitting: Internal Medicine

## 2013-06-18 VITALS — BP 118/68 | HR 81 | Temp 98.4°F | Resp 14 | Ht 66.5 in | Wt 142.2 lb

## 2013-06-18 DIAGNOSIS — L0390 Cellulitis, unspecified: Secondary | ICD-10-CM

## 2013-06-18 DIAGNOSIS — E11621 Type 2 diabetes mellitus with foot ulcer: Secondary | ICD-10-CM

## 2013-06-18 DIAGNOSIS — E119 Type 2 diabetes mellitus without complications: Secondary | ICD-10-CM

## 2013-06-18 DIAGNOSIS — L97509 Non-pressure chronic ulcer of other part of unspecified foot with unspecified severity: Secondary | ICD-10-CM

## 2013-06-18 DIAGNOSIS — L0291 Cutaneous abscess, unspecified: Secondary | ICD-10-CM

## 2013-06-18 DIAGNOSIS — E1169 Type 2 diabetes mellitus with other specified complication: Secondary | ICD-10-CM

## 2013-06-18 MED ORDER — CEPHALEXIN 500 MG PO CAPS: 500.0000 mg | ORAL_CAPSULE | Freq: Four times a day (QID) | ORAL | Status: DC

## 2013-06-18 NOTE — Progress Notes (Signed)
Patient ID: Jennifer [name], female   DOB: 03/10/1961, 52 y.o.   MRN: [id number]  Chief Complaint  Patient presents with  . Sore    sore on right big toe   No Known Allergies  HPI 52 y/o female patient is seen today for acute complaint of sore in her right big toe. She noticed it 4 days back, has occassional throbbing pain and has noticed clear drainage from it. She has hx of dm and is not taking her lantus, only taking novolog for now. Has not checked blood sugar at present at home.  Review of Systems  Constitutional: Negative for fever, chills and diaphoresis.  Respiratory: Negative for cough and shortness of breath.   Cardiovascular: Negative for chest pain and leg swelling.  Gastrointestinal: Negative for heartburn, nausea and vomiting.  Genitourinary: Negative for dysuria.  Musculoskeletal: Negative for falls.  Skin: Negative for rash.  Neurological: Negative for dizziness, weakness and headaches.  Psychiatric/Behavioral: Negative for depression.    BP 118/68  Pulse 81  Temp(Src) 98.4 F (36.9 C) (Oral)  Resp 14  Ht 5' 6.5" (1.689 m)  Wt 142 lb 3.2 oz (64.501 kg)  BMI 22.61 kg/m2  LMP 02/09/2011  Physical Exam  Constitutional: She is oriented to person, place, and time and well-developed, well-nourished, and in no distress.  Cardiovascular: Normal rate, regular rhythm and normal heart sounds.   Pulmonary/Chest: Effort normal and breath sounds normal.  Abdominal: Soft. Bowel sounds are normal. She exhibits no distension and no mass. There is no tenderness.  Musculoskeletal: Normal range of motion. She exhibits no edema  Neurological: She is alert and oriented to person, place, and time.  Skin: Skin is warm and dry. She is not diaphoretic. Has a macerated skin area on right toe and serous drainage with some blood in it. Dorsalis pedis is palpable.   A/p  Cellulitis- will cover her with keflex for possible staph infection. Have send wound swab for culture. Dry dressing for  now. Will get foot xray to assess for gangrene vs osteomyelitis. Check cbc, bmp  Diabetic foot ulcer- with cellulitis. Will start keflex empirically. Continue novolog for now. Check a1c. Pt has hx of non compliance with her medication. She mentions giving herself novolog but has no sugar reading taken. Explained importance of complaince and if sugar remains uncontrolled and ulcer worsens, she can lose her toe. She understands this. Pt has been asked to see her podiatrist in a day or 2.  i will see her in a week and if no improvement will get mri and need to start iv antibiotics. Will follow on culture report

## 2013-06-18 NOTE — Addendum Note (Signed)
Addended by: Melina Modena F on: 06/18/2013 04:58 PM   Modules accepted: Orders

## 2013-06-18 NOTE — Patient Instructions (Addendum)
  You will need to see your foot doctor as soon as possible. i will see you in office in 1 week. Wear open shoes and apply dry dressing to your wound for now as done in the office.    Diabetes and Foot Care Diabetes may cause you to have a poor blood supply (circulation) to your legs and feet. Because of this, the skin may be thinner, break easier, and heal more slowly. You also may have nerve damage in your legs and feet causing decreased feeling. You may not notice minor injuries to your feet that could lead to serious problems or infections. Taking care of your feet is one of the most important things you can do for yourself.  HOME CARE INSTRUCTIONS  Do not go barefoot. Bare feet are easily injured.  Check your feet daily for blisters, cuts, and redness.  Wash your feet with warm water (not hot) and mild soap. Pat your feet and between your toes until completely dry.  Apply a moisturizing lotion that does not contain alcohol or petroleum jelly to the dry skin on your feet and to dry brittle toenails. Do not put it between your toes.  Trim your toenails straight across. Do not dig under them or around the cuticle.  Do not cut corns or calluses, or try to remove them with medicine.  Wear clean cotton socks or stockings every day. Make sure they are not too tight. Do not wear knee high stockings since they may decrease blood flow to your legs.  Wear leather shoes that fit properly and have enough cushioning. To break in new shoes, wear them just a few hours a day to avoid injuring your feet.  Wear shoes at all times, even in the house.  Do not cross your legs. This may decrease the blood flow to your feet.  If you find a minor scrape, cut, or break in the skin on your feet, keep it and the skin around it clean and dry. These areas may be cleansed with mild soap and water. Do not use peroxide, alcohol, iodine or Merthiolate.  When you remove an adhesive bandage, be sure not to harm the  skin around it.  If you have a wound, look at it several times a day to make sure it is healing.  Do not use heating pads or hot water bottles. Burns can occur. If you have lost feeling in your feet or legs, you may not know it is happening until it is too late.  Report any cuts, sores or bruises to your caregiver. Do not wait! SEEK MEDICAL CARE IF:   You have an injury that is not healing or you notice redness, numbness, burning, or tingling.  Your feet always feel cold.  You have pain or cramps in your legs and feet. SEEK IMMEDIATE MEDICAL CARE IF:   There is increasing redness, swelling, or increasing pain in the wound.  There is a red line that goes up your leg.  Pus is coming from a wound.  You develop an unexplained oral temperature above 102 F (38.9 C), or as your caregiver suggests.  You notice a bad smell coming from an ulcer or wound. MAKE SURE YOU:   Understand these instructions.  Will watch your condition.  Will get help right away if you are not doing well or get worse. Document Released: 10/27/2000 Document Revised: 01/22/2012 Document Reviewed: 05/05/2009 [facility] Patient Information 2014 [place], [place].

## 2013-06-19 LAB — CBC WITH DIFFERENTIAL/PLATELET
Basos: 0 % (ref 0–3)
Eos: 2 % (ref 0–5)
HCT: 34.8 % (ref 34.0–46.6)
Lymphocytes Absolute: 2.7 10*3/uL (ref 0.7–3.1)
MCV: 92 fL (ref 79–97)
Monocytes Absolute: 0.4 10*3/uL (ref 0.1–0.9)
Neutrophils Absolute: 2.7 10*3/uL (ref 1.4–7.0)
RBC: 3.79 x10E6/uL (ref 3.77–5.28)
WBC: 6 10*3/uL (ref 3.4–10.8)

## 2013-06-19 LAB — BASIC METABOLIC PANEL
BUN/Creatinine Ratio: 16 (ref 9–23)
Calcium: 9.8 mg/dL (ref 8.7–10.2)
Creatinine, Ser: 1.11 mg/dL — ABNORMAL HIGH (ref 0.57–1.00)
GFR calc non Af Amer: 58 mL/min/{1.73_m2} — ABNORMAL LOW (ref >59)
Sodium: 138 mmol/L (ref 134–144)

## 2013-06-20 ENCOUNTER — Encounter: Payer: Self-pay | Admitting: *Deleted

## 2013-06-20 ENCOUNTER — Other Ambulatory Visit: Payer: Self-pay | Admitting: *Deleted

## 2013-06-20 MED ORDER — INSULIN GLARGINE 100 UNIT/ML SOLOSTAR PEN: PEN_INJECTOR | SUBCUTANEOUS | Status: DC

## 2013-06-23 LAB — ANAEROBIC AND AEROBIC CULTURE

## 2013-06-25 ENCOUNTER — Telehealth: Payer: Self-pay | Admitting: *Deleted

## 2013-06-25 ENCOUNTER — Ambulatory Visit: Payer: Self-pay | Admitting: Internal Medicine

## 2013-06-25 DIAGNOSIS — Z0289 Encounter for other administrative examinations: Secondary | ICD-10-CM

## 2013-06-25 NOTE — Telephone Encounter (Signed)
Tried contacting patient regarding missed appointment today. 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 Dr. Glade Lloyd was wanting to speak with Patient.

## 2013-07-03 ENCOUNTER — Encounter: Payer: Self-pay | Admitting: *Deleted

## 2013-07-03 ENCOUNTER — Other Ambulatory Visit: Payer: Self-pay | Admitting: *Deleted

## 2013-07-03 ENCOUNTER — Telehealth: Payer: Self-pay | Admitting: *Deleted

## 2013-07-03 ENCOUNTER — Other Ambulatory Visit: Payer: Self-pay | Admitting: Internal Medicine

## 2013-07-03 MED ORDER — SACCHAROMYCES BOULARDII 250 MG PO CAPS: 250.0000 mg | ORAL_CAPSULE | Freq: Two times a day (BID) | ORAL | Status: DC

## 2013-07-03 MED ORDER — LEVOFLOXACIN 500 MG PO TABS: 500.0000 mg | ORAL_TABLET | Freq: Every day | ORAL | Status: DC

## 2013-07-03 MED ORDER — SULFAMETHOXAZOLE-TMP DS 800-160 MG PO TABS: 1.0000 | ORAL_TABLET | Freq: Two times a day (BID) | ORAL | Status: DC

## 2013-07-03 NOTE — Telephone Encounter (Signed)
Tried calling patient, at home, work and brother's number listed, with Culture results. Unable to reach patient as before when trying to reach her regarding her appointment. Called pharmacy and made them aware. [facility] [address]. Sent patient a letter to call us and that she had results and needs to schedule an appointment.

## 2013-07-04 NOTE — Progress Notes (Signed)
Tried calling patient. No success. Sent patient a letter to call the office and that her medication was called into [facility] on [address].

## 2013-07-07 ENCOUNTER — Ambulatory Visit: Payer: Self-pay | Admitting: Pharmacotherapy

## 2013-11-04 ENCOUNTER — Encounter: Payer: Self-pay | Admitting: Internal Medicine

## 2013-11-04 ENCOUNTER — Ambulatory Visit (INDEPENDENT_AMBULATORY_CARE_PROVIDER_SITE_OTHER): Payer: BC Managed Care – PPO | Admitting: Internal Medicine

## 2013-11-04 ENCOUNTER — Inpatient Hospital Stay (HOSPITAL_COMMUNITY): Payer: BC Managed Care – PPO

## 2013-11-04 ENCOUNTER — Inpatient Hospital Stay (HOSPITAL_COMMUNITY)
Admission: AD | Admit: 2013-11-04 | Discharge: 2013-11-10 | DRG: 617 | Disposition: A | Discharge status: Home Health | Payer: BC Managed Care – PPO | Source: Ambulatory Visit | Attending: Internal Medicine | Admitting: Internal Medicine

## 2013-11-04 VITALS — BP 134/82 | HR 104 | Temp 98.0°F | Wt 145.8 lb

## 2013-11-04 DIAGNOSIS — E1143 Type 2 diabetes mellitus with diabetic autonomic (poly)neuropathy: Secondary | ICD-10-CM

## 2013-11-04 DIAGNOSIS — L0291 Cutaneous abscess, unspecified: Secondary | ICD-10-CM

## 2013-11-04 DIAGNOSIS — E119 Type 2 diabetes mellitus without complications: Secondary | ICD-10-CM

## 2013-11-04 DIAGNOSIS — K7200 Acute and subacute hepatic failure without coma: Secondary | ICD-10-CM

## 2013-11-04 DIAGNOSIS — M869 Osteomyelitis, unspecified: Secondary | ICD-10-CM | POA: Diagnosis present

## 2013-11-04 DIAGNOSIS — Z794 Long term (current) use of insulin: Secondary | ICD-10-CM

## 2013-11-04 DIAGNOSIS — E1169 Type 2 diabetes mellitus with other specified complication: Secondary | ICD-10-CM | POA: Diagnosis present

## 2013-11-04 DIAGNOSIS — I951 Orthostatic hypotension: Secondary | ICD-10-CM

## 2013-11-04 DIAGNOSIS — M8448XA Pathological fracture, other site, initial encounter for fracture: Secondary | ICD-10-CM | POA: Diagnosis present

## 2013-11-04 DIAGNOSIS — E876 Hypokalemia: Secondary | ICD-10-CM

## 2013-11-04 DIAGNOSIS — IMO0002 Reserved for concepts with insufficient information to code with codable children: Principal | ICD-10-CM | POA: Diagnosis present

## 2013-11-04 DIAGNOSIS — L02619 Cutaneous abscess of unspecified foot: Secondary | ICD-10-CM | POA: Diagnosis present

## 2013-11-04 DIAGNOSIS — R609 Edema, unspecified: Secondary | ICD-10-CM

## 2013-11-04 DIAGNOSIS — L03039 Cellulitis of unspecified toe: Secondary | ICD-10-CM | POA: Diagnosis present

## 2013-11-04 DIAGNOSIS — R748 Abnormal levels of other serum enzymes: Secondary | ICD-10-CM

## 2013-11-04 DIAGNOSIS — L600 Ingrowing nail: Secondary | ICD-10-CM

## 2013-11-04 DIAGNOSIS — I998 Other disorder of circulatory system: Secondary | ICD-10-CM

## 2013-11-04 DIAGNOSIS — I959 Hypotension, unspecified: Secondary | ICD-10-CM | POA: Diagnosis not present

## 2013-11-04 DIAGNOSIS — Z91199 Patient's noncompliance with other medical treatment and regimen due to unspecified reason: Secondary | ICD-10-CM

## 2013-11-04 DIAGNOSIS — R112 Nausea with vomiting, unspecified: Secondary | ICD-10-CM

## 2013-11-04 DIAGNOSIS — Z9119 Patient's noncompliance with other medical treatment and regimen: Secondary | ICD-10-CM

## 2013-11-04 DIAGNOSIS — Z833 Family history of diabetes mellitus: Secondary | ICD-10-CM

## 2013-11-04 DIAGNOSIS — L0390 Cellulitis, unspecified: Secondary | ICD-10-CM

## 2013-11-04 DIAGNOSIS — I10 Essential (primary) hypertension: Secondary | ICD-10-CM | POA: Diagnosis present

## 2013-11-04 DIAGNOSIS — R6510 Systemic inflammatory response syndrome (SIRS) of non-infectious origin without acute organ dysfunction: Secondary | ICD-10-CM

## 2013-11-04 DIAGNOSIS — Z820 Family history of epilepsy and other diseases of the nervous system: Secondary | ICD-10-CM

## 2013-11-04 DIAGNOSIS — M9080 Osteopathy in diseases classified elsewhere, unspecified site: Secondary | ICD-10-CM | POA: Diagnosis present

## 2013-11-04 DIAGNOSIS — E1165 Type 2 diabetes mellitus with hyperglycemia: Secondary | ICD-10-CM

## 2013-11-04 DIAGNOSIS — K589 Irritable bowel syndrome without diarrhea: Secondary | ICD-10-CM | POA: Diagnosis present

## 2013-11-04 DIAGNOSIS — L97509 Non-pressure chronic ulcer of other part of unspecified foot with unspecified severity: Secondary | ICD-10-CM

## 2013-11-04 DIAGNOSIS — I999 Unspecified disorder of circulatory system: Secondary | ICD-10-CM

## 2013-11-04 DIAGNOSIS — E11621 Type 2 diabetes mellitus with foot ulcer: Secondary | ICD-10-CM | POA: Diagnosis present

## 2013-11-04 DIAGNOSIS — D72829 Elevated white blood cell count, unspecified: Secondary | ICD-10-CM

## 2013-11-04 DIAGNOSIS — R600 Localized edema: Secondary | ICD-10-CM | POA: Diagnosis present

## 2013-11-04 HISTORY — DX: Other specified postprocedural states: Z98.890

## 2013-11-04 HISTORY — DX: Nausea with vomiting, unspecified: R11.2

## 2013-11-04 HISTORY — DX: Adverse effect of unspecified anesthetic, initial encounter: T41.45XA

## 2013-11-04 HISTORY — DX: Other complications of anesthesia, initial encounter: T88.59XA

## 2013-11-04 LAB — BASIC METABOLIC PANEL
BUN: 7 mg/dL (ref 6–23)
CO2: 29 mEq/L (ref 19–32)
Calcium: 9.4 mg/dL (ref 8.4–10.5)
Chloride: 95 mEq/L — ABNORMAL LOW (ref 96–112)
Creatinine, Ser: 0.71 mg/dL (ref 0.50–1.10)
Glucose, Bld: 243 mg/dL — ABNORMAL HIGH (ref 70–99)
Potassium: 4 mEq/L (ref 3.5–5.1)

## 2013-11-04 LAB — CBC
HCT: 31.8 % — ABNORMAL LOW (ref 36.0–46.0)
Hemoglobin: 11 g/dL — ABNORMAL LOW (ref 12.0–15.0)
MCH: 31.3 pg (ref 26.0–34.0)
MCHC: 34.6 g/dL (ref 30.0–36.0)
MCV: 90.3 fL (ref 78.0–100.0)
RBC: 3.52 MIL/uL — ABNORMAL LOW (ref 3.87–5.11)
WBC: 9.4 10*3/uL (ref 4.0–10.5)

## 2013-11-04 LAB — GLUCOSE, CAPILLARY: Glucose-Capillary: 255 mg/dL — ABNORMAL HIGH (ref 70–99)

## 2013-11-04 LAB — SEDIMENTATION RATE: Sed Rate: 93 mm/hr — ABNORMAL HIGH (ref 0–22)

## 2013-11-04 LAB — GLUCOSE, POCT (MANUAL RESULT ENTRY): POC Glucose: 353 mg/dl — AB (ref 70–99)

## 2013-11-04 MED ORDER — VANCOMYCIN HCL IN DEXTROSE 750-5 MG/150ML-% IV SOLN
750.0000 mg | Freq: Three times a day (TID) | INTRAVENOUS | Status: DC
  Administered 2013-11-05 – 2013-11-06 (×4): 750 mg via INTRAVENOUS
  Filled 2013-11-04 (×6): qty 150

## 2013-11-04 MED ORDER — HYDRALAZINE HCL 20 MG/ML IJ SOLN: 10.0000 mg | Freq: Four times a day (QID) | INTRAMUSCULAR | Status: DC | PRN

## 2013-11-04 MED ORDER — VANCOMYCIN HCL IN DEXTROSE 1-5 GM/200ML-% IV SOLN
1000.0000 mg | Freq: Once | INTRAVENOUS | Status: AC
  Administered 2013-11-04: 1000 mg via INTRAVENOUS
  Filled 2013-11-04: qty 200

## 2013-11-04 MED ORDER — HEPARIN SODIUM (PORCINE) 5000 UNIT/ML IJ SOLN
5000.0000 [IU] | Freq: Three times a day (TID) | INTRAMUSCULAR | Status: DC
  Administered 2013-11-04 – 2013-11-08 (×8): 5000 [IU] via SUBCUTANEOUS
  Filled 2013-11-04 (×15): qty 1

## 2013-11-04 MED ORDER — ACETAMINOPHEN 325 MG PO TABS
650.0000 mg | ORAL_TABLET | Freq: Four times a day (QID) | ORAL | Status: DC | PRN
  Administered 2013-11-05 – 2013-11-06 (×3): 650 mg via ORAL
  Filled 2013-11-04 (×3): qty 2

## 2013-11-04 MED ORDER — INSULIN ASPART 100 UNIT/ML ~~LOC~~ SOLN
0.0000 [IU] | Freq: Every day | SUBCUTANEOUS | Status: DC
  Administered 2013-11-04: 3 [IU] via SUBCUTANEOUS
  Administered 2013-11-05: 2 [IU] via SUBCUTANEOUS

## 2013-11-04 MED ORDER — INSULIN GLARGINE 100 UNIT/ML SOLOSTAR PEN: PEN_INJECTOR | SUBCUTANEOUS | Status: DC

## 2013-11-04 MED ORDER — ONDANSETRON HCL 4 MG PO TABS: 4.0000 mg | ORAL_TABLET | Freq: Four times a day (QID) | ORAL | Status: DC | PRN

## 2013-11-04 MED ORDER — ONDANSETRON HCL 4 MG/2ML IJ SOLN
4.0000 mg | Freq: Four times a day (QID) | INTRAMUSCULAR | Status: DC | PRN
  Administered 2013-11-06 (×2): 4 mg via INTRAVENOUS
  Filled 2013-11-04 (×2): qty 2

## 2013-11-04 MED ORDER — INSULIN ASPART 100 UNIT/ML ~~LOC~~ SOLN
0.0000 [IU] | Freq: Three times a day (TID) | SUBCUTANEOUS | Status: DC
  Administered 2013-11-05: 1 [IU] via SUBCUTANEOUS
  Administered 2013-11-05 – 2013-11-07 (×6): 2 [IU] via SUBCUTANEOUS
  Administered 2013-11-08: 5 [IU] via SUBCUTANEOUS
  Administered 2013-11-08: 2 [IU] via SUBCUTANEOUS

## 2013-11-04 MED ORDER — PROMETHAZINE HCL 12.5 MG PO TABS: 25.0000 mg | ORAL_TABLET | ORAL | Status: DC | PRN

## 2013-11-04 MED ORDER — ACETAMINOPHEN 650 MG RE SUPP: 650.0000 mg | Freq: Four times a day (QID) | RECTAL | Status: DC | PRN

## 2013-11-04 MED ORDER — SODIUM CHLORIDE 0.9 % IV SOLN
INTRAVENOUS | Status: DC
  Administered 2013-11-05: 1000 mL via INTRAVENOUS
  Administered 2013-11-07: 12:00:00 via INTRAVENOUS

## 2013-11-04 MED ORDER — HYDROCODONE-ACETAMINOPHEN 5-325 MG PO TABS
1.0000 | ORAL_TABLET | ORAL | Status: DC | PRN
  Administered 2013-11-09: 2 via ORAL

## 2013-11-04 MED ORDER — INSULIN ASPART 100 UNIT/ML ~~LOC~~ SOLN: 0.0000 [IU] | Freq: Three times a day (TID) | SUBCUTANEOUS | Status: DC

## 2013-11-04 MED ORDER — INSULIN GLARGINE 100 UNIT/ML ~~LOC~~ SOLN
10.0000 [IU] | Freq: Every day | SUBCUTANEOUS | Status: DC
  Administered 2013-11-04: 10 [IU] via SUBCUTANEOUS
  Filled 2013-11-04 (×2): qty 0.1

## 2013-11-04 MED ORDER — PIPERACILLIN-TAZOBACTAM 3.375 G IVPB
3.3750 g | Freq: Three times a day (TID) | INTRAVENOUS | Status: DC
  Administered 2013-11-04 – 2013-11-09 (×14): 3.375 g via INTRAVENOUS
  Filled 2013-11-04 (×16): qty 50

## 2013-11-04 MED ORDER — HYDROMORPHONE HCL PF 1 MG/ML IJ SOLN: 1.0000 mg | INTRAMUSCULAR | Status: DC | PRN

## 2013-11-04 NOTE — Progress Notes (Signed)
Patient ID: Jennifer [name], female   DOB: February 02, 1961, 52 y.o.   MRN: [id number]     Chief Complaint  Patient presents with  . Acute Visit    toe pain and drainage   No Known Allergies  HPI 52 y/o female patient with uncontrolled diabetes, non compliant with her medication and follow up is here for acute visit. She complaints of discomfort in both her great toes and drainage in right big toe. She has been feeling feverish on and off for two weeks. This is associated with chills and diaphoresis. The drainage has been present for 2 weeks, initally was yellowish green and she was soaking and applying bandage. Slowly her toe started turning white and now has bloody drainage this am. Denies any sensation in both her toes besides tingling  Review of Systems  Constitutional: Negative for weight [name], malaise/fatigue  HENT: Negative for congestion, hearing [name] and sore throat.   Eyes: Negative for blurred vision, double vision and discharge.  Respiratory: Negative for cough, sputum production, shortness of breath and wheezing.   Cardiovascular: Negative for chest pain, palpitations, orthopnea and leg swelling.  Gastrointestinal: Negative for heartburn, nausea, vomiting, abdominal pain, diarrhea and constipation.  Neurological: Negative for dizziness, tingling, focal weakness and headaches.   Past Medical History  Diagnosis Date  . Diabetes mellitus   . Ulcer of lower limb, unspecified     ulcer on top of foot (right)  . Slow transit constipation   . Disorder of bone and cartilage, unspecified   . Unspecified essential hypertension   . Orthostatic hypotension   . Reflux esophagitis   . Gastroparesis   . Irritable bowel syndrome   . Flatulence, eructation, and gas pain     Past Surgical History  Procedure Laterality Date  . Esophagogastroduodenoscopy  08/11/2012    Procedure: ESOPHAGOGASTRODUODENOSCOPY (EGD);  Surgeon: Shirley Friar, MD;  Location: [facility];  Service:  Endoscopy;  Laterality: N/A;  . Tubal ligation     Medication reviewed. See [facility]  Physical exam BP 134/82  Pulse 104  Temp(Src) 98 F (36.7 C) (Oral)  Wt 145 lb 12.8 oz (66.134 kg)  SpO2 97%  LMP 02/09/2011  General- adult female in no acute distress Head- atraumatic, normocephalic Eyes- PERRLA, EOMI, no pallor, no icterus, no discharge Neck- no lymphadenopathy, no thyromegaly, no jugular vein distension Cardiovascular- normal s1,s2, no murmurs/ rubs/ gallops Respiratory- bilateral clear to auscultation, no wheeze, no rhonchi, no crackles Abdomen- bowel sounds present, soft, non tender Musculoskeletal- able to move all 4 extremities, no spinal and paraspinal tenderness, unable to palpate right dorsalis pedis, feeble left dorsalis pedis Neurological- no focal deficit, no pinprick sensation in right great toe Psychiatry- alert and oriented to person, place and time, normal mood and affect skin- has white discoloration of right great toe and left great toe. Has opening on tip of right great toe with foul smelling blood mixed purulent drainage  Labs- Lab Results  Component Value Date   HGBA1C 11.7* 06/18/2013    CMP     Component Value Date/Time   NA 138 06/18/2013 1654   NA 135 08/28/2012 0505   K 4.5 06/18/2013 1654   CL 98 06/18/2013 1654   CO2 26 06/18/2013 1654   GLUCOSE 284* 06/18/2013 1654   GLUCOSE 167* 08/28/2012 0505   BUN 18 06/18/2013 1654   BUN 3* 08/28/2012 0505   CREATININE 1.11* 06/18/2013 1654   CALCIUM 9.8 06/18/2013 1654   PROT 6.8 05/14/2013 1528   PROT  6.8 08/17/2012 0410   ALBUMIN 3.0* 08/17/2012 0410   AST 26 05/14/2013 1528   ALT 24 05/14/2013 1528   ALKPHOS 77 05/14/2013 1528   BILITOT 0.4 05/14/2013 1528   GFRNONAA 58* 06/18/2013 1654   GFRAA 67 06/18/2013 1654    Assessment/plan  Osteomyelitis- with purulent drainage from great toe with compromised vascular supply and poorly controlled diabetes, concerns for osteomyelitis. Will need to be admitted and started on broad  spectrum antibiotics until culture report is resulted. Will need mri toe to assess for osteomyelitis involvement  Acute Limb ischemia- with [name] of dorsalis pedis pulse, infected toe and poor dm, concerns for toe ischemia. Will need urgent vascular referral to assess and treat for limb threatening condition- revascularization vs surgery  SIRS- with her being febrile at home and tachycardic in clinic and having a source of infection, clearly meets SIRS criteria and will need admission for further infectious workup and treatment  Uncontrolled dm type 2- cbg 353 in clinic today, will need her insulin dose titrated in setting of infection  Called direct admission line, spoke with Dr Darnelle Catalan and pt accepted for admission to med surg team 10. Bed co-ordinator to call back with bed and pt will be transferred to [facility]  Patient going to 5N bed 1

## 2013-11-04 NOTE — Consult Note (Signed)
ANTIBIOTIC CONSULT NOTE - INITIAL  Pharmacy Consult for Vancomycin and Zosyn Indication: osteomyelitis of both great toes  No Known Allergies  Patient Measurements: Height: 5\' 7"  (170.2 cm) Weight: 145 lb (65.772 kg) IBW/kg (Calculated) : 61.6  Vital Signs: Temp: 98.8 F (37.1 C) (12/23 1647) Temp src: Oral (12/23 1401) BP: 175/96 mmHg (12/23 1647) Pulse Rate: 96 (12/23 1647) Intake/Output from previous day:   Intake/Output from this shift:    Labs: No results found for this basename: WBC, HGB, PLT, LABCREA, CREATININE,  in the last 72 hours Estimated Creatinine Clearance: 59 ml/min (by C-G formula based on Cr of 1.11).  Microbiology: No results found for this or any previous visit (from the past 720 hour(s)).  Medical History: Past Medical History  Diagnosis Date  . Diabetes mellitus   . Ulcer of lower limb, unspecified     ulcer on top of foot (right)  . Slow transit constipation   . Disorder of bone and cartilage, unspecified   . Unspecified essential hypertension   . Orthostatic hypotension   . Reflux esophagitis   . Gastroparesis   . Irritable bowel syndrome   . Flatulence, eructation, and gas pain    Assessment: 52yof with hx uncontrolled diabetes presents with worsening drainage from her bilateral great toe wounds x 2 weeks. Xray of right toe shows osteo, xray of left toe suspicious for osteo.  She will begin empiric antibiotics. May need one or both toes amputated. Renal function wnl with sCr 0.7 and CrCl 22ml/min.  Goal of Therapy:  Vancomycin trough level 15-20 mcg/ml   Plan:  1) Vancomycin 1g IV x 1 then 750mg  IV q8 2) Zosyn 3.375g IV q8  3) Follow renal function, cultures, LOT, trough at steady state  Fredrik Rigger 11/04/2013,6:45 PM

## 2013-11-04 NOTE — H&P (Signed)
History and Physical       Hospital Admission Note Date: 11/04/2013  Patient name: Jennifer [name] Medical record number: [id number] Date of birth: Jul 05, 1961 Age: 52 y.o. Gender: female  PCP: Lenoard Aden, NP    Chief Complaint:  Foot ulcers for the last 2 weeks  HPI: Patient is a 52 year old female with uncontrolled diabetes was sent from PCPs office for worsening draining foot ulcers in the last 2 weeks. History was obtained from the patient who reported that the toe nail came off of both great toes 2 weeks ago and then she started having discomfort in the toes along with drainage. She noticed intermittent fever and chills for the last 2 weeks. Then she noticed that initially the drainage was yellowish, green, she was trying the wound care herself. Today she noticed that her who had been turning white and having bloody drainage. Patient was sent to the [facility] for further workup. Patient also has been getting tight shoes for job which has been worsening the whole situation.  Review of Systems:  Constitutional: + fever, chills, diaphoresis, poor appetite and fatigue.  HEENT: Denies photophobia, eye pain, redness, hearing loss, ear pain, congestion, sore throat, rhinorrhea, sneezing, mouth sores, trouble swallowing, neck pain, neck stiffness and tinnitus.   Respiratory: Denies SOB, DOE, cough, chest tightness,  and wheezing.   Cardiovascular: Denies chest pain, palpitations and leg swelling.  Gastrointestinal: Denies nausea, vomiting, abdominal pain, diarrhea, constipation, blood in stool and abdominal distention.  Genitourinary: Denies dysuria, urgency, frequency, hematuria, flank pain and difficulty urinating.  Musculoskeletal: Denies myalgias, back pain, joint swelling, arthralgias and gait problem.  Skin: Please see examination  Neurological: Denies dizziness, seizures, syncope, weakness, light-headedness,  numbness and headaches.  Hematological: Denies adenopathy. Easy bruising, personal or family bleeding history  Psychiatric/Behavioral: Denies suicidal ideation, mood changes, confusion, nervousness, sleep disturbance and agitation  Past Medical History: Past Medical History  Diagnosis Date  . Diabetes mellitus   . Ulcer of lower limb, unspecified     ulcer on top of foot (right)  . Slow transit constipation   . Disorder of bone and cartilage, unspecified   . Unspecified essential hypertension   . Orthostatic hypotension   . Reflux esophagitis   . Gastroparesis   . Irritable bowel syndrome   . Flatulence, eructation, and gas pain    Past Surgical History  Procedure Laterality Date  . Esophagogastroduodenoscopy  08/11/2012    Procedure: ESOPHAGOGASTRODUODENOSCOPY (EGD);  Surgeon: Shirley Friar, MD;  Location: [facility];  Service: Endoscopy;  Laterality: N/A;  . Tubal ligation      Medications: Prior to Admission medications   Medication Sig Start Date End Date Taking? Authorizing Provider  insulin glargine (LANTUS) 100 UNIT/ML injection Inject 5 Units into the skin every morning.   Yes Historical Provider, MD    Allergies:  No Known Allergies  Social History:  reports that she has never smoked. She does not have any smokeless tobacco history on file. She reports that she does not drink alcohol or use illicit drugs.  Family History: Family History  Problem Relation Age of Onset  . Diabetes Mother   . Alzheimer's disease Father     Physical Exam: Blood pressure 175/96, pulse 96, temperature 98.8 F (37.1 C), resp. rate 16, height 5\' 7"  (1.702 m), weight 65.772 kg (145 lb), last menstrual period 02/09/2011, SpO2 99.00%. General: Alert, awake, oriented x3, in no acute distress. HEENT: normocephalic, atraumatic, anicteric sclera, pink conjunctiva, pupils equal and reactive  to light and accomodation, oropharynx clear Neck: supple, no masses or lymphadenopathy, no  goiter, no bruits  Heart: Regular rate and rhythm, without murmurs, rubs or gallops. Lungs: Clear to auscultation bilaterally, no wheezing, rales or rhonchi. Abdomen: Soft, nontender, nondistended, positive bowel sounds, no masses. Extremities:1- 2+ pitting edema with right more than left, whitish discoloration of both right great toe and left great toe with drainage, foul smelling purulent Neuro: Grossly intact, no focal neurological deficits, strength 5/5 upper and lower extremities bilaterally Psych: alert and oriented x 3, normal mood and affect Skin: no rashes or lesions, warm and dry   LABS on Admission:  Basic Metabolic Panel: No results found for this basename: NA, K, CL, CO2, GLUCOSE, BUN, CREATININE, CALCIUM, MG, PHOS,  in the last 168 hours Liver Function Tests: No results found for this basename: AST, ALT, ALKPHOS, BILITOT, PROT, ALBUMIN,  in the last 168 hours No results found for this basename: LIPASE, AMYLASE,  in the last 168 hours No results found for this basename: AMMONIA,  in the last 168 hours CBC: No results found for this basename: WBC, NEUTROABS, HGB, HCT, MCV, PLT,  in the last 168 hours Cardiac Enzymes: No results found for this basename: CKTOTAL, CKMB, CKMBINDEX, TROPONINI,  in the last 168 hours BNP: No components found with this basename: POCBNP,  CBG:  Recent Labs Lab 11/04/13 1654  GLUCAP 256*     Radiological Exams on Admission: No results found.  Assessment/Plan Principal Problem:   Diabetic foot ulcers likely worsened due to her noncompliance, uncontrolled diabetes - Will admit for IV antibiotics and further workup. Will obtain stat CBC, BMET, blood cultures, ESR, CRP - Obtain stat x-rays of the right and left foot, will likely need MRI of both toes, check ABIs. - Called Dr Roda Shutters for orthopedic consultation, will likely need debridement.   Active Problems:   DM2 (diabetes mellitus, type 2) - Placed on Lantus 10 units, sliding scale insulin,  carb modified diet    Hypertension - Placed on hydralazine as needed, she's not on any antihypertensives outpatient    Pedal edema - Check Doppler ultrasound of the lower extremities to rule out any DVT  DVT prophylaxis: Heparin subcutaneous  CODE STATUS: Full code  Family Communication: Admission, patients condition and plan of care including tests being ordered have been discussed with the patient who indicates understanding and agree with the plan and Code Status   Further plan will depend as patient's clinical course evolves and further radiologic and laboratory data become available.   Time Spent on Admission: 1 hour  RAI,RIPUDEEP M.D. [facility] 11/04/2013, 5:16 PM Pager: [phone]  If 7PM-7AM, please contact night-coverage [email or web address] Password [email or web address]

## 2013-11-04 NOTE — Consult Note (Signed)
   ORTHOPAEDIC CONSULTATION  REQUESTING PHYSICIAN: Ripudeep Jenna Luo, MD  Chief Complaint: Bilateral great toe wound  HPI: Jennifer [name] is a 52 y.o. female who complains of bilateral great toe wound.  Had local I&D last year and resolved with abx and wound care.  Now has 2 week h/o bilateral great toe wounds at the tip with drainage and foul odor.  Wears steel toe boots which she attributes ulcers to.  Uncontrolled DM currently.  Past Medical History  Diagnosis Date  . Diabetes mellitus   . Ulcer of lower limb, unspecified     ulcer on top of foot (right)  . Slow transit constipation   . Disorder of bone and cartilage, unspecified   . Unspecified essential hypertension   . Orthostatic hypotension   . Reflux esophagitis   . Gastroparesis   . Irritable bowel syndrome   . Flatulence, eructation, and gas pain    Past Surgical History  Procedure Laterality Date  . Esophagogastroduodenoscopy  08/11/2012    Procedure: ESOPHAGOGASTRODUODENOSCOPY (EGD);  Surgeon: Shirley Friar, MD;  Location: [facility];  Service: Endoscopy;  Laterality: N/A;  . Tubal ligation     History   Social History  . Marital Status: Divorced    Spouse Name: N/A    Number of Children: N/A  . Years of Education: N/A   Social History Main Topics  . Smoking status: Never Smoker   . Smokeless tobacco: Not on file  . Alcohol Use: No  . Drug Use: No  . Sexual Activity: Not on file   Other Topics Concern  . Not on file   Social History Narrative   ** Merged History Encounter **       Family History  Problem Relation Age of Onset  . Diabetes Mother   . Alzheimer's disease Father    No Known Allergies Prior to Admission medications   Medication Sig Start Date End Date Taking? Authorizing Provider  insulin glargine (LANTUS) 100 UNIT/ML injection Inject 5 Units into the skin every morning.   Yes Historical Provider, MD   No results found.  Positive ROS: All other systems have been reviewed and  were otherwise negative with the exception of those mentioned in the HPI and as above.  Physical Exam: General: Alert, no acute distress Cardiovascular: No pedal edema Respiratory: No cyanosis, no use of accessory musculature GI: No organomegaly, abdomen is soft and non-tender Skin: No lesions in the area of chief complaint Neurologic: Sensation intact distally Psychiatric: Patient is competent for consent with normal mood and affect Lymphatic: No axillary or cervical lymphadenopathy  MUSCULOSKELETAL:   B great toes - wound at tips with drainage - worse on the right - foot swelling also - 2+ pulses  Assessment: Bilateral great toe ulcers  Plan: 1. MRI, XRs 2. Would likely need amputation of right great toe and possibly left one too 3. IV abx per hospitalist 4. Will follow  Thank you for the consult and the opportunity to see Jennifer [name]  N. Glee Arvin, MD [facility] [phone] 6:06 PM

## 2013-11-05 ENCOUNTER — Inpatient Hospital Stay (HOSPITAL_COMMUNITY): Payer: BC Managed Care – PPO

## 2013-11-05 DIAGNOSIS — E1165 Type 2 diabetes mellitus with hyperglycemia: Secondary | ICD-10-CM | POA: Diagnosis present

## 2013-11-05 DIAGNOSIS — L97509 Non-pressure chronic ulcer of other part of unspecified foot with unspecified severity: Secondary | ICD-10-CM

## 2013-11-05 DIAGNOSIS — E1169 Type 2 diabetes mellitus with other specified complication: Secondary | ICD-10-CM | POA: Diagnosis present

## 2013-11-05 DIAGNOSIS — M9080 Osteopathy in diseases classified elsewhere, unspecified site: Secondary | ICD-10-CM

## 2013-11-05 DIAGNOSIS — M869 Osteomyelitis, unspecified: Secondary | ICD-10-CM

## 2013-11-05 DIAGNOSIS — M7989 Other specified soft tissue disorders: Secondary | ICD-10-CM

## 2013-11-05 LAB — GLUCOSE, CAPILLARY
Glucose-Capillary: 146 mg/dL — ABNORMAL HIGH (ref 70–99)
Glucose-Capillary: 191 mg/dL — ABNORMAL HIGH (ref 70–99)

## 2013-11-05 LAB — BASIC METABOLIC PANEL
BUN: 7 mg/dL (ref 6–23)
CO2: 29 mEq/L (ref 19–32)
Calcium: 8.9 mg/dL (ref 8.4–10.5)
Chloride: 99 mEq/L (ref 96–112)
Creatinine, Ser: 0.92 mg/dL (ref 0.50–1.10)
GFR calc non Af Amer: 71 mL/min — ABNORMAL LOW (ref >90)
Glucose, Bld: 206 mg/dL — ABNORMAL HIGH (ref 70–99)
Potassium: 3.8 mEq/L (ref 3.5–5.1)
Sodium: 136 mEq/L (ref 135–145)

## 2013-11-05 LAB — CBC
Hemoglobin: 10.1 g/dL — ABNORMAL LOW (ref 12.0–15.0)
MCH: 30.8 pg (ref 26.0–34.0)
MCHC: 33.9 g/dL (ref 30.0–36.0)
MCV: 90.9 fL (ref 78.0–100.0)
Platelets: 384 10*3/uL (ref 150–400)
RBC: 3.28 MIL/uL — ABNORMAL LOW (ref 3.87–5.11)
RDW: 11.5 % (ref 11.5–15.5)

## 2013-11-05 LAB — C-REACTIVE PROTEIN: CRP: 3.7 mg/dL — ABNORMAL HIGH (ref <0.60)

## 2013-11-05 LAB — HEMOGLOBIN A1C: Mean Plasma Glucose: 286 mg/dL — ABNORMAL HIGH (ref <117)

## 2013-11-05 MED ORDER — GADOBENATE DIMEGLUMINE 529 MG/ML IV SOLN: 13.5000 mL | Freq: Once | INTRAVENOUS | Status: DC

## 2013-11-05 MED ORDER — INSULIN GLARGINE 100 UNIT/ML ~~LOC~~ SOLN
15.0000 [IU] | Freq: Every day | SUBCUTANEOUS | Status: DC
  Administered 2013-11-05 – 2013-11-08 (×4): 15 [IU] via SUBCUTANEOUS
  Filled 2013-11-05 (×5): qty 0.15

## 2013-11-05 MED ORDER — GADOBENATE DIMEGLUMINE 529 MG/ML IV SOLN
13.5000 mL | Freq: Once | INTRAVENOUS | Status: AC
  Administered 2013-11-05: 13.5 mL via INTRAVENOUS

## 2013-11-05 NOTE — Treatment Plan (Signed)
Discussed MRI findings with patient over phone.  Recommend partial right great toe amputation and patient is in agreement. We can attempt local wound care with long term IV abx for treatment of left great toe ulcer although I do not think this will ultimately be effective, but patient is adamant we attempt this. Plan for surgery Friday.  Mayra Reel, MD [facility] [phone] 4:07 PM

## 2013-11-05 NOTE — Progress Notes (Signed)
VASCULAR LAB PRELIMINARY  PRELIMINARY  PRELIMINARY  PRELIMINARY  Right lower extremity venous duplex completed.    Preliminary report:  Right:  No evidence of DVT, superficial thrombosis, or Baker's cyst.  Valin Massie, RVS 11/05/2013, 12:32 PM

## 2013-11-05 NOTE — Progress Notes (Signed)
[facility] PROGRESS NOTE  Jennifer [name] ZOX:[id number] DOB: 1961/06/13 DOA: 11/04/2013 PCP: Lenoard Aden, NP  Brief narrative 52 year old female with uncontrolled diabetes mellitus sent in PCP office for worsening draining bilateral toe ulcers for possible weeks. Patient found to have osteomyelitis of distal phalanx oral bilateral great toes on imaging.   Assessment/Plan: Osteomyelitis of bilateral great toes.( Rt worse than left) Also noted for drainage wound and pathological fracture of the distal right phalanx. Appreciate orthopedics consult. Plan for partial right bid toe amputation on 12/26. i am assuming  plan is for amputation of both the great toes. Will verify with ortho. ABI normal bilaterally -Continue empiric antibiotics with vancomycin and Zosyn. Will determine duration of abx post surgery.   Uncontrolled diabetes mellitus In the setting of medication noncompliance. Patient placed on 10 units of Lantus at bedtime with sliding scale insulin. I would increase the dose to 15 units. Patient counseled on medication compliance  Hypertension Continue when necessary hydralazine  DVT prophylaxis Subcutaneous heparin   Diet: Diabetic  Code Status: Full code Family Communication: At bedside Disposition Plan: Likely home early next week following surgery . Needs at least 2 weeks IV abx if amputation of both great toes performed   Consultants:  Orthopedics ( Dr Roda Shutters)  Procedures:  ABI  Antibiotics:  IV vancomycin and Zosyn (12/23.--)  HPI/Subjective: Patient seen and examined this afternoon. Discussed MRI findings and plan for surgery.  Objective: Filed Vitals:   11/05/13 0620  BP: 112/56  Pulse: 88  Temp: 97.6 F (36.4 C)  Resp: 18   No intake or output data in the 24 hours ending 11/05/13 1735 Filed Weights   11/04/13 1647  Weight: 65.772 kg (145 lb)    Exam:   General:  Middle aged female in no acute distress  HEENT: No pallor,  moist oral mucosa  Chest: Clear" bilaterally, no added sounds  CVS: Normal S1 and S2, no murmurs rub or gallop  Abdomen: Soft, nontender, nondistended, bowel sounds present  Extremities: Cellulitis and abscess or right great toe with swelling over the right foot and distal tibia, cellulitis of left great toe. Distal pulses palpable, has normal sensation bilaterally  CNS: AAO x3    Data Reviewed: Basic Metabolic Panel:  Recent Labs Lab 11/04/13 1855 11/05/13 0512  NA 134* 136  K 4.0 3.8  CL 95* 99  CO2 29 29  GLUCOSE 243* 206*  BUN 7 7  CREATININE 0.71 0.92  CALCIUM 9.4 8.9   Liver Function Tests: No results found for this basename: AST, ALT, ALKPHOS, BILITOT, PROT, ALBUMIN,  in the last 168 hours No results found for this basename: LIPASE, AMYLASE,  in the last 168 hours No results found for this basename: AMMONIA,  in the last 168 hours CBC:  Recent Labs Lab 11/04/13 1855 11/05/13 0512  WBC 9.4 8.3  HGB 11.0* 10.1*  HCT 31.8* 29.8*  MCV 90.3 90.9  PLT 428* 384   Cardiac Enzymes: No results found for this basename: CKTOTAL, CKMB, CKMBINDEX, TROPONINI,  in the last 168 hours BNP (last 3 results) No results found for this basename: PROBNP,  in the last 8760 hours CBG:  Recent Labs Lab 11/04/13 1654 11/04/13 2138 11/05/13 0546 11/05/13 1334 11/05/13 1608  GLUCAP 256* 255* 192* 146* 191*    No results found for this or any previous visit (from the past 240 hour(s)).   Studies: Mr Foot Right W Wo Contrast  11/05/2013   CLINICAL DATA:  Cellulitis and draining wound  of the tip of the right great toe.  EXAM: MRI OF THE RIGHT FOREFOOT WITHOUT AND WITH CONTRAST  TECHNIQUE: Multiplanar, multisequence MR imaging was performed both before and after administration of intravenous contrast.  CONTRAST:  13.5 cc MultiHance  COMPARISON:  Radiograph dated 11/04/2013  FINDINGS: There is osteomyelitis of the entire and distal phalangeal bone of the great toe with a  pathologic fracture. The draining wound at the tip of the toe extends into the bone and into the pathologic fracture. There is no enhancement of the soft tissues around the abscess which appears to have packing within it. The proximal phalangeal bone appears normal. There is only a tiny effusion in the IP joint.  Remainder of the forefoot demonstrates no significant abnormality.  IMPRESSION: Osteomyelitis of the distal phalangeal bone of the great toe with a pathologic fracture. The draining wound directly extends into the pathologic fracture.   Electronically Signed   By: Geanie Cooley M.D.   On: 11/05/2013 13:03   Mr Foot Left W Wo Contrast  11/05/2013   CLINICAL DATA:  Soft tissue wound at the tip of the left great toe with cellulitis. Drainage.  EXAM: MRI OF THE LEFT FOREFOOT WITHOUT AND WITH CONTRAST  TECHNIQUE: Multiplanar, multisequence MR imaging was performed both before and after administration of intravenous contrast.  CONTRAST:  13.32mL MULTIHANCE GADOBENATE DIMEGLUMINE 529 MG/ML IV SOLN  COMPARISON:  Radiographs dated 11/04/2013  FINDINGS: There is diffuse edema and diffuse abnormal enhancement of the distal phalangeal bone of the great toe. The soft tissue wound extends directly to the tuft of the bone. There is no definable bone destruction. There is no abscess. There is no effusion at the IP joint of the great toe.  There is minimal edema and enhancement in the distal aspect of the proximal phalanx of the great toe. The other visualized bones of the forefoot are normal.  There are no soft tissue abscesses.  IMPRESSION: Osteomyelitis of the distal phalangeal bone of the left great toe.   Electronically Signed   By: Geanie Cooley M.D.   On: 11/05/2013 12:55   Dg Foot Complete Left  11/04/2013   CLINICAL DATA:  Left great toe ulcer.  EXAM: LEFT FOOT - COMPLETE 3+ VIEW  COMPARISON:  None.  FINDINGS: Soft tissue irregularity of the distal left great toe. There is a small area of cortical disruption  in the distal aspect of the 1st distal phalanx, medially. Arterial calcifications are noted. No soft tissue gas.  IMPRESSION: Small area of bone destruction in the distal aspect of the 1st distal phalanx, suspicious for early osteomyelitis.   Electronically Signed   By: Gordan Payment M.D.   On: 11/04/2013 18:46   Dg Foot Complete Right  11/04/2013   CLINICAL DATA:  Right great toe ulcer.  EXAM: RIGHT FOOT COMPLETE - 3+ VIEW  COMPARISON:  06/18/2013.  FINDINGS: Interval bone destruction and pathological fracture through the midportion of the 1st distal phalanx. The fracture is mildly comminuted with extension into the 1st IP joint. Distal and medial soft tissue irregularity and soft tissue swelling. Small amount of soft tissue gas medially and at the fracture site.  IMPRESSION: 1. Changes of osteomyelitis of the 1st distal phalanx with a comminuted pathological fracture with intra-articular extension. 2. Small amount of soft tissue gas, suspicious for infection with a gas-forming organism.   Electronically Signed   By: Gordan Payment M.D.   On: 11/04/2013 18:48    Scheduled Meds: . gadobenate dimeglumine  13.5 mL Intravenous Once  . heparin  5,000 Units Subcutaneous Q8H  . insulin aspart  0-5 Units Subcutaneous QHS  . insulin aspart  0-9 Units Subcutaneous TID WC  . insulin glargine  10 Units Subcutaneous QHS  . piperacillin-tazobactam (ZOSYN)  IV  3.375 g Intravenous Q8H  . vancomycin  750 mg Intravenous Q8H   Continuous Infusions: . sodium chloride 1,000 mL (11/05/13 1410)      Time spent: 25 minutes    Haya Hemler  [facility] Pager  [phone]. If 7PM-7AM, please contact night-coverage at [email or web address], password [facility] 11/05/2013, 5:35 PM  LOS: 1 day

## 2013-11-05 NOTE — Progress Notes (Addendum)
Inpatient Diabetes Program Recommendations  AACE/ADA: New Consensus Statement on Inpatient Glycemic Control (2013)  Target Ranges:  Prepandial:   less than 140 mg/dL      Peak postprandial:   less than 180 mg/dL (1-2 hours)      Critically ill patients:  140 - 180 mg/dL  Results for WILMETTA, SPEISER (MRN [id number]) as of 11/05/2013 09:30  Ref. Range 11/04/2013 16:54 11/04/2013 21:38 11/05/2013 05:46  Glucose-Capillary Latest Range: 70-99 mg/dL 409 (H) 811 (H) 914 (H)    Inpatient Diabetes Program Recommendations HgbA1C: =11.6 will need follow up with primary for tighter control after discharge Diabetes Coordinator met with patient to discuss A1C results and DM management at home.  Patient reports that her PCP took her off of all her DM meds except Lantus 5 units.  Encouraged patient to monitor CBGs and record for PCP to evaluate at follow up appointment and discuss treatment options.  Patient reports that she has to wear steel toed shoes at work and they are causing ulcers on feet.  Encouraged patient to discuss with PCP and employer about possible options for a different shoe or boot.  No further questions/concerns at this time.   Thank you  Piedad Climes BSN, RN,CDE Inpatient Diabetes Coordinator [phone] (team pager)

## 2013-11-05 NOTE — Progress Notes (Signed)
VASCULAR LAB PRELIMINARY  ARTERIAL  ABI completed:    RIGHT    LEFT    PRESSURE WAVEFORM  PRESSURE WAVEFORM  BRACHIAL 169 Triphasic BRACHIAL 172 Triphasic  DP 191 Triphasic DP 204 Triphasic  PT 200 Triphasic PT 201 Triphasic    RIGHT LEFT  ABI 1.16 1.19   ABIs and Doppler waveforms are within normal limits bilaterally at rest.   Michalla Ringer, RVS 11/05/2013, 12:35 PM

## 2013-11-06 DIAGNOSIS — I959 Hypotension, unspecified: Secondary | ICD-10-CM | POA: Diagnosis not present

## 2013-11-06 LAB — GLUCOSE, CAPILLARY
Glucose-Capillary: 154 mg/dL — ABNORMAL HIGH (ref 70–99)
Glucose-Capillary: 193 mg/dL — ABNORMAL HIGH (ref 70–99)

## 2013-11-06 MED ORDER — VANCOMYCIN HCL IN DEXTROSE 1-5 GM/200ML-% IV SOLN
1000.0000 mg | Freq: Two times a day (BID) | INTRAVENOUS | Status: DC
  Administered 2013-11-06 – 2013-11-08 (×5): 1000 mg via INTRAVENOUS
  Filled 2013-11-06 (×7): qty 200

## 2013-11-06 MED ORDER — SODIUM CHLORIDE 0.9 % IV BOLUS (SEPSIS)
1000.0000 mL | Freq: Once | INTRAVENOUS | Status: AC
  Administered 2013-11-06: 1000 mL via INTRAVENOUS

## 2013-11-06 NOTE — Progress Notes (Signed)
Patient ID: Jennifer [name], female   DOB: [birth year], 52 y.o.   MRN: [id number] Partial great toe amp on Friday by Dr. Roda Shutters.  Continue IV ABX.

## 2013-11-06 NOTE — Consult Note (Signed)
ANTIBIOTIC CONSULT NOTE - Follow Up  Pharmacy Consult for Vancomycin  Indication: osteomyelitis of both great toes  No Known Allergies  Patient Measurements: Height: 5\' 7"  (170.2 cm) Weight: 145 lb (65.772 kg) IBW/kg (Calculated) : 61.6  Vital Signs: Temp: 97.6 F (36.4 C) (12/25 1333) BP: 75/46 mmHg (12/25 1333) Pulse Rate: 94 (12/25 1333) Intake/Output from previous day:   Intake/Output from this shift: Total I/O In: 840 [P.O.:840] Out: -   Labs:  Recent Labs  11/04/13 1855 11/05/13 0512  WBC 9.4 8.3  HGB 11.0* 10.1*  PLT 428* 384  CREATININE 0.71 0.92   Estimated Creatinine Clearance: 71.1 ml/min (by C-G formula based on Cr of 0.92).  Microbiology: Recent Results (from the past 720 hour(s))  CULTURE, BLOOD (ROUTINE X 2)     Status: None   Collection Time    11/04/13  6:40 PM      Result Value Range Status   Specimen Description BLOOD LEFT HAND   Final   Special Requests BOTTLES DRAWN AEROBIC ONLY 5CC   Final   Culture  Setup Time     Final   Value: 11/05/2013 00:50     Performed at [facility]   Culture     Final   Value:        BLOOD CULTURE RECEIVED NO GROWTH TO DATE CULTURE WILL BE HELD FOR 5 DAYS BEFORE ISSUING A FINAL NEGATIVE REPORT     Performed at [facility]   Report Status PENDING   Incomplete  CULTURE, BLOOD (ROUTINE X 2)     Status: None   Collection Time    11/04/13  6:55 PM      Result Value Range Status   Specimen Description BLOOD LEFT HAND   Final   Special Requests BOTTLES DRAWN AEROBIC ONLY 5CC   Final   Culture  Setup Time     Final   Value: 11/05/2013 00:50     Performed at [facility]   Culture     Final   Value:        BLOOD CULTURE RECEIVED NO GROWTH TO DATE CULTURE WILL BE HELD FOR 5 DAYS BEFORE ISSUING A FINAL NEGATIVE REPORT     Performed at [facility]   Report Status PENDING   Incomplete    Medical History: Past Medical History  Diagnosis Date  . Diabetes mellitus   . Ulcer of  lower limb, unspecified     ulcer on top of foot (right)  . Slow transit constipation   . Disorder of bone and cartilage, unspecified   . Unspecified essential hypertension   . Orthostatic hypotension   . Reflux esophagitis   . Gastroparesis   . Irritable bowel syndrome   . Flatulence, eructation, and gas pain    Assessment: 52yof with hx uncontrolled diabetes presents with worsening drainage from her bilateral great toe wounds x 2 weeks. Xray of right toe shows osteo, xray of left toe suspicious for osteo.  She will begin empiric antibiotics. May need one or both toes amputated. Vanc trough today 28.2 mcg/ml  Goal of Therapy:  Vancomycin trough level 15-20 mcg/ml   Plan:  1) Change vanc to 1gm IV q12 hours 2) Cont Zosyn 3.375g IV q8  3) Follow renal function, cultures, LOT, trough at steady state  Nicol Herbig Poteet 11/06/2013,5:59 PM

## 2013-11-06 NOTE — Progress Notes (Signed)
[facility] PROGRESS NOTE  Jennifer [name] WUJ:[id number] DOB: July 28, 1961 DOA: 11/04/2013 PCP: Lenoard Aden, NP  Brief narrative  52 year old female with uncontrolled diabetes mellitus sent in PCP office for worsening draining bilateral toe ulcers for possible weeks. Patient found to have osteomyelitis of distal phalanx oral bilateral great toes on imaging.   Assessment/Plan:  Osteomyelitis of bilateral great toes.( Rt worse than left)  Also noted for drainage wound and pathological fracture of the distal right phalanx.  Appreciate orthopedics consult. Plan for partial right bid toe amputation on 12/26. ABI normal bilaterally  -Continue empiric antibiotics with vancomycin and Zosyn. Will determine duration of abx post surgery.   Uncontrolled diabetes mellitus  In the setting of medication noncompliance. Patient placed on 10 units of Lantus at bedtime with sliding scale insulin. Fsg stable increased the dose to 15 units.   Patient counseled on medication compliance   Hypertension  BP low this afternoon . Ordered 1 L NS bolus. Patient asymptomatic   DVT prophylaxis  Subcutaneous heparin   Diet: Diabetic  Code Status: Full code  Family Communication: At bedside  Disposition Plan: Likely home early next week following surgery . Needs at least 2 weeks IV abx if amputation of both great toes performed   Consultants:  Orthopedics ( Dr Roda Shutters) Procedures:  ABI Antibiotics:  IV vancomycin and Zosyn (12/23.--)  HPI/Subjective:  Feels better today    Objective: Filed Vitals:   11/06/13 1333  BP: 75/46  Pulse: 94  Temp: 97.6 F (36.4 C)  Resp: 18    Intake/Output Summary (Last 24 hours) at 11/06/13 1346 Last data filed at 11/06/13 0730  Gross per 24 hour  Intake    240 ml  Output      0 ml  Net    240 ml   Filed Weights   11/04/13 1647  Weight: 65.772 kg (145 lb)    Exam:  General: Middle aged female in no acute distress  HEENT: No pallor, moist oral  mucosa  Chest: Clear bilaterally, no added sounds  CVS: Normal S1 and S2, no murmurs rub or gallop  Abdomen: Soft, nontender, nondistended, bowel sounds present  Extremities: Cellulitis and abscess or right great toe with swelling over the right foot and distal tibia, cellulitis of left great toe. Distal pulses palpable, has normal sensation bilaterally  CNS: AAO x3  Data Reviewed: Basic Metabolic Panel:  Recent Labs Lab 11/04/13 1855 11/05/13 0512  NA 134* 136  K 4.0 3.8  CL 95* 99  CO2 29 29  GLUCOSE 243* 206*  BUN 7 7  CREATININE 0.71 0.92  CALCIUM 9.4 8.9   Liver Function Tests: No results found for this basename: AST, ALT, ALKPHOS, BILITOT, PROT, ALBUMIN,  in the last 168 hours No results found for this basename: LIPASE, AMYLASE,  in the last 168 hours No results found for this basename: AMMONIA,  in the last 168 hours CBC:  Recent Labs Lab 11/04/13 1855 11/05/13 0512  WBC 9.4 8.3  HGB 11.0* 10.1*  HCT 31.8* 29.8*  MCV 90.3 90.9  PLT 428* 384   Cardiac Enzymes: No results found for this basename: CKTOTAL, CKMB, CKMBINDEX, TROPONINI,  in the last 168 hours BNP (last 3 results) No results found for this basename: PROBNP,  in the last 8760 hours CBG:  Recent Labs Lab 11/05/13 1334 11/05/13 1608 11/05/13 2127 11/06/13 0632 11/06/13 1105  GLUCAP 146* 191* 246* 154* 193*    Recent Results (from the past 240 hour(s))  CULTURE,  BLOOD (ROUTINE X 2)     Status: None   Collection Time    11/04/13  6:40 PM      Result Value Range Status   Specimen Description BLOOD LEFT HAND   Final   Special Requests BOTTLES DRAWN AEROBIC ONLY 5CC   Final   Culture  Setup Time     Final   Value: 11/05/2013 00:50     Performed at [facility]   Culture     Final   Value:        BLOOD CULTURE RECEIVED NO GROWTH TO DATE CULTURE WILL BE HELD FOR 5 DAYS BEFORE ISSUING A FINAL NEGATIVE REPORT     Performed at [facility]   Report Status PENDING   Incomplete   CULTURE, BLOOD (ROUTINE X 2)     Status: None   Collection Time    11/04/13  6:55 PM      Result Value Range Status   Specimen Description BLOOD LEFT HAND   Final   Special Requests BOTTLES DRAWN AEROBIC ONLY 5CC   Final   Culture  Setup Time     Final   Value: 11/05/2013 00:50     Performed at [facility]   Culture     Final   Value:        BLOOD CULTURE RECEIVED NO GROWTH TO DATE CULTURE WILL BE HELD FOR 5 DAYS BEFORE ISSUING A FINAL NEGATIVE REPORT     Performed at [facility]   Report Status PENDING   Incomplete     Studies: Mr Foot Right W Wo Contrast  11/05/2013   CLINICAL DATA:  Cellulitis and draining wound of the tip of the right great toe.  EXAM: MRI OF THE RIGHT FOREFOOT WITHOUT AND WITH CONTRAST  TECHNIQUE: Multiplanar, multisequence MR imaging was performed both before and after administration of intravenous contrast.  CONTRAST:  13.5 cc MultiHance  COMPARISON:  Radiograph dated 11/04/2013  FINDINGS: There is osteomyelitis of the entire and distal phalangeal bone of the great toe with a pathologic fracture. The draining wound at the tip of the toe extends into the bone and into the pathologic fracture. There is no enhancement of the soft tissues around the abscess which appears to have packing within it. The proximal phalangeal bone appears normal. There is only a tiny effusion in the IP joint.  Remainder of the forefoot demonstrates no significant abnormality.  IMPRESSION: Osteomyelitis of the distal phalangeal bone of the great toe with a pathologic fracture. The draining wound directly extends into the pathologic fracture.   Electronically Signed   By: Geanie Cooley M.D.   On: 11/05/2013 13:03   Mr Foot Left W Wo Contrast  11/05/2013   CLINICAL DATA:  Soft tissue wound at the tip of the left great toe with cellulitis. Drainage.  EXAM: MRI OF THE LEFT FOREFOOT WITHOUT AND WITH CONTRAST  TECHNIQUE: Multiplanar, multisequence MR imaging was performed both before  and after administration of intravenous contrast.  CONTRAST:  13.30mL MULTIHANCE GADOBENATE DIMEGLUMINE 529 MG/ML IV SOLN  COMPARISON:  Radiographs dated 11/04/2013  FINDINGS: There is diffuse edema and diffuse abnormal enhancement of the distal phalangeal bone of the great toe. The soft tissue wound extends directly to the tuft of the bone. There is no definable bone destruction. There is no abscess. There is no effusion at the IP joint of the great toe.  There is minimal edema and enhancement in the distal aspect of the proximal phalanx  of the great toe. The other visualized bones of the forefoot are normal.  There are no soft tissue abscesses.  IMPRESSION: Osteomyelitis of the distal phalangeal bone of the left great toe.   Electronically Signed   By: Geanie Cooley M.D.   On: 11/05/2013 12:55   Dg Foot Complete Left  11/04/2013   CLINICAL DATA:  Left great toe ulcer.  EXAM: LEFT FOOT - COMPLETE 3+ VIEW  COMPARISON:  None.  FINDINGS: Soft tissue irregularity of the distal left great toe. There is a small area of cortical disruption in the distal aspect of the 1st distal phalanx, medially. Arterial calcifications are noted. No soft tissue gas.  IMPRESSION: Small area of bone destruction in the distal aspect of the 1st distal phalanx, suspicious for early osteomyelitis.   Electronically Signed   By: Gordan Payment M.D.   On: 11/04/2013 18:46   Dg Foot Complete Right  11/04/2013   CLINICAL DATA:  Right great toe ulcer.  EXAM: RIGHT FOOT COMPLETE - 3+ VIEW  COMPARISON:  06/18/2013.  FINDINGS: Interval bone destruction and pathological fracture through the midportion of the 1st distal phalanx. The fracture is mildly comminuted with extension into the 1st IP joint. Distal and medial soft tissue irregularity and soft tissue swelling. Small amount of soft tissue gas medially and at the fracture site.  IMPRESSION: 1. Changes of osteomyelitis of the 1st distal phalanx with a comminuted pathological fracture with  intra-articular extension. 2. Small amount of soft tissue gas, suspicious for infection with a gas-forming organism.   Electronically Signed   By: Gordan Payment M.D.   On: 11/04/2013 18:48    Scheduled Meds: . gadobenate dimeglumine  13.5 mL Intravenous Once  . heparin  5,000 Units Subcutaneous Q8H  . insulin aspart  0-5 Units Subcutaneous QHS  . insulin aspart  0-9 Units Subcutaneous TID WC  . insulin glargine  15 Units Subcutaneous QHS  . piperacillin-tazobactam (ZOSYN)  IV  3.375 g Intravenous Q8H  . sodium chloride  1,000 mL Intravenous Once  . vancomycin  750 mg Intravenous Q8H   Continuous Infusions: . sodium chloride 1,000 mL (11/05/13 1410)      Time spent:25 minutes    Sherrilyn Nairn  [facility] Pager [phone] If 7PM-7AM, please contact night-coverage at [email or web address], password [facility] 11/06/2013, 1:46 PM  LOS: 2 days

## 2013-11-07 ENCOUNTER — Encounter (HOSPITAL_COMMUNITY): Payer: Self-pay | Admitting: Certified Registered Nurse Anesthetist

## 2013-11-07 ENCOUNTER — Encounter (HOSPITAL_COMMUNITY)
Admission: AD | Disposition: A | Discharge status: Home Health | Payer: Self-pay | Source: Ambulatory Visit | Attending: Internal Medicine

## 2013-11-07 ENCOUNTER — Encounter (HOSPITAL_COMMUNITY): Payer: BC Managed Care – PPO | Admitting: Certified Registered Nurse Anesthetist

## 2013-11-07 ENCOUNTER — Inpatient Hospital Stay (HOSPITAL_COMMUNITY): Payer: BC Managed Care – PPO | Admitting: Certified Registered Nurse Anesthetist

## 2013-11-07 DIAGNOSIS — I10 Essential (primary) hypertension: Secondary | ICD-10-CM

## 2013-11-07 HISTORY — PX: AMPUTATION: SHX166

## 2013-11-07 LAB — GLUCOSE, CAPILLARY
Glucose-Capillary: 121 mg/dL — ABNORMAL HIGH (ref 70–99)
Glucose-Capillary: 95 mg/dL (ref 70–99)
Glucose-Capillary: 147 mg/dL — ABNORMAL HIGH (ref 70–99)

## 2013-11-07 LAB — SURGICAL PCR SCREEN
MRSA, PCR: NEGATIVE
Staphylococcus aureus: NEGATIVE

## 2013-11-07 SURGERY — AMPUTATION DIGIT
Anesthesia: General | Laterality: Right

## 2013-11-07 MED ORDER — 0.9 % SODIUM CHLORIDE (POUR BTL) OPTIME
TOPICAL | Status: DC | PRN
  Administered 2013-11-07: 1000 mL

## 2013-11-07 MED ORDER — OXYCODONE HCL 5 MG PO TABS: 5.0000 mg | ORAL_TABLET | Freq: Once | ORAL | Status: DC | PRN

## 2013-11-07 MED ORDER — ONDANSETRON HCL 4 MG/2ML IJ SOLN
INTRAMUSCULAR | Status: DC | PRN
  Administered 2013-11-07: 4 mg via INTRAVENOUS

## 2013-11-07 MED ORDER — EPHEDRINE SULFATE 50 MG/ML IJ SOLN
INTRAMUSCULAR | Status: DC | PRN
  Administered 2013-11-07: 5 mg via INTRAVENOUS

## 2013-11-07 MED ORDER — LACTATED RINGERS IV SOLN
INTRAVENOUS | Status: DC | PRN
  Administered 2013-11-07: 12:00:00 via INTRAVENOUS

## 2013-11-07 MED ORDER — HYDROMORPHONE HCL PF 1 MG/ML IJ SOLN: 0.2500 mg | INTRAMUSCULAR | Status: DC | PRN

## 2013-11-07 MED ORDER — PROPOFOL 10 MG/ML IV BOLUS
INTRAVENOUS | Status: DC | PRN
  Administered 2013-11-07: 180 mg via INTRAVENOUS

## 2013-11-07 MED ORDER — ONDANSETRON HCL 4 MG/2ML IJ SOLN: 4.0000 mg | Freq: Once | INTRAMUSCULAR | Status: DC | PRN

## 2013-11-07 MED ORDER — DEXTROSE 50 % IV SOLN
INTRAVENOUS | Status: AC
  Administered 2013-11-07: 25 mL
  Filled 2013-11-07: qty 50

## 2013-11-07 MED ORDER — FENTANYL CITRATE 0.05 MG/ML IJ SOLN
INTRAMUSCULAR | Status: DC | PRN
  Administered 2013-11-07 (×2): 50 ug via INTRAVENOUS

## 2013-11-07 MED ORDER — OXYCODONE HCL 5 MG/5ML PO SOLN: 5.0000 mg | Freq: Once | ORAL | Status: DC | PRN

## 2013-11-07 MED ORDER — MIDAZOLAM HCL 5 MG/5ML IJ SOLN
INTRAMUSCULAR | Status: DC | PRN
  Administered 2013-11-07: 2 mg via INTRAVENOUS

## 2013-11-07 MED ORDER — SODIUM CHLORIDE 0.9 % IR SOLN
Status: DC | PRN
  Administered 2013-11-07: 3000 mL

## 2013-11-07 SURGICAL SUPPLY — 11 items
BANDAGE CONFORM 2  STR LF (GAUZE/BANDAGES/DRESSINGS) ×2 IMPLANT
BLADE LONG MED 31X9 (MISCELLANEOUS) ×1 IMPLANT
BNDG COHESIVE 4X5 TAN STRL (GAUZE/BANDAGES/DRESSINGS) ×1 IMPLANT
GAUZE XEROFORM 1X8 LF (GAUZE/BANDAGES/DRESSINGS) ×1 IMPLANT
GLOVE BIOGEL PI ORTHO PRO 7.5 (GLOVE) ×2
GLOVE PI ORTHO PRO STRL 7.5 (GLOVE) IMPLANT
GLOVE SURG SS PI 7.0 STRL IVOR (GLOVE) ×2 IMPLANT
GLOVE SURG SS PI 7.5 STRL IVOR (GLOVE) ×4 IMPLANT
PACK ORTHO EXTREMITY (CUSTOM PROCEDURE TRAY) ×1 IMPLANT
SPONGE GAUZE 4X4 12PLY (GAUZE/BANDAGES/DRESSINGS) ×1 IMPLANT
SUT ETHILON 3 0 PS 1 (SUTURE) ×2 IMPLANT

## 2013-11-07 NOTE — Anesthesia Preprocedure Evaluation (Addendum)
Anesthesia Evaluation  Patient identified by MRN, date of birth, ID band Patient awake    Reviewed: Allergy & Precautions, H&P , NPO status , Patient's Chart, lab work & pertinent test results  Airway Mallampati: II TM Distance: >3 FB Neck ROM: Full    Dental  (+) Edentulous Upper and Dental Advisory Given   Pulmonary  breath sounds clear to auscultation        Cardiovascular hypertension, Rhythm:Regular Rate:Normal     Neuro/Psych    GI/Hepatic   Endo/Other    Renal/GU      Musculoskeletal   Abdominal   Peds  Hematology   Anesthesia Other Findings   Reproductive/Obstetrics                         Anesthesia Physical Anesthesia Plan  ASA: III  Anesthesia Plan: General   Post-op Pain Management:    Induction: Intravenous  Airway Management Planned: LMA  Additional Equipment:   Intra-op Plan:   Post-operative Plan: Extubation in OR  Informed Consent: I have reviewed the patients History and Physical, chart, labs and discussed the procedure including the risks, benefits and alternatives for the proposed anesthesia with the patient or authorized representative who has indicated his/her understanding and acceptance.   Dental advisory given  Plan Discussed with: CRNA and Anesthesiologist  Anesthesia Plan Comments: (Diabetic ulcer R. Great toe Type 2 DM glucose 121 H/O orthostatic hypotension  Plan GA with LMA)        Anesthesia Quick Evaluation

## 2013-11-07 NOTE — Transfer of Care (Signed)
Immediate Anesthesia Transfer of Care Note  Patient: Jennifer [name]  Procedure(s) Performed: Procedure(s): AMPUTATION DIGIT - Right great toe (Right)  Patient Location: PACU  Anesthesia Type:General  Level of Consciousness: awake, alert  and oriented  Airway & Oxygen Therapy: Patient Spontanous Breathing  Post-op Assessment: Report given to PACU RN  Post vital signs: Reviewed and stable  Complications: No apparent anesthesia complications

## 2013-11-07 NOTE — Progress Notes (Signed)
Orthopedic Tech Progress Note Patient Details:  Jennifer [name] 19-May-1961 [id number]  Ortho Devices Type of Ortho Device: Postop shoe/boot Ortho Device/Splint Interventions: Ordered OR Staff stated that order for post op shoe is singular and not bilateral; staff said they will correct order  Nikki Dom 11/07/2013, 1:25 PM

## 2013-11-07 NOTE — Anesthesia Postprocedure Evaluation (Signed)
  Anesthesia Post-op Note  Patient: Jennifer [name]  Procedure(s) Performed: Procedure(s): AMPUTATION DIGIT - Right great toe (Right)  Patient Location: PACU  Anesthesia Type:General  Level of Consciousness: awake, alert  and oriented  Airway and Oxygen Therapy: Patient Spontanous Breathing  Post-op Pain: none  Post-op Assessment: Post-op Vital signs reviewed, Patient's Cardiovascular Status Stable, Respiratory Function Stable, Patent Airway and Pain level controlled  Post-op Vital Signs: stable  Complications: No apparent anesthesia complications

## 2013-11-07 NOTE — Progress Notes (Signed)
Pt given 25 ml of 50 % dextrose IV push for cgb of 61. Pt reports only symptoms of a headache. Md Roda Shutters updated. Pt will continue to surgery.

## 2013-11-07 NOTE — Preoperative (Signed)
Beta Blockers   Reason not to administer Beta Blockers:Not Applicable 

## 2013-11-07 NOTE — H&P (Signed)
H&P update  The surgical history has been reviewed and remains accurate without interval change.  The patient was re-examined and patient's physiologic condition has not changed significantly in the last 30 days. The condition still exists that makes this procedure necessary. The treatment plan remains the same, without new options for care.  No new pharmacological allergies or types of therapy has been initiated that would change the plan or the appropriateness of the plan.  The patient and/or family understand the potential benefits and risks.  Mayra Reel, MD 11/07/2013 6:56 AM

## 2013-11-07 NOTE — Progress Notes (Signed)
[facility] PROGRESS NOTE  Jennifer [name] WGN:[id number] DOB: 01/19/1961 DOA: 11/04/2013 PCP: Lenoard Aden, NP  Brief narrative  52 year old female with uncontrolled diabetes mellitus sent in PCP office for worsening draining bilateral toe ulcers for possible weeks. Patient found to have osteomyelitis of distal phalanx oral bilateral great toes on imaging.   Assessment/Plan:  Osteomyelitis of bilateral great toes.( Rt worse than left)  Also noted for drainage wound and pathological fracture of the distal right phalanx.  Appreciate orthopedics consult.  right big toe amputation done today. tolerated well. She refused having amputation of the left big toe as she was very scared. She now wants to consider habing amputation of the left toe as well. i discussed this with Dr Roda Shutters who will address this during rounds in am. We will plan to treat her for 6 weeks with IV antibiotics if no sx on left great toe.Marland Kitchen  ABI normal bilaterally  -Continue empiric antibiotics with vancomycin and Zosyn.  Will order PICC line for prolonged IV abx following left toe amputation early next week  Uncontrolled diabetes mellitus  In the setting of medication noncompliance. Patient placed on 10 units of Lantus at bedtime with sliding scale insulin. Fsg stable increased the dose to 15 units.  Patient counseled on medication compliance   Hypertension  Blood pressure elevated postsurgically. Held  blood pressure medication yesterday as blood pressure was low . We'll place on when necessary hydralazine  DVT prophylaxis  Subcutaneous heparin   Diet: Diabetic   Code Status: Full code  Family Communication: daughter  At bedside  Disposition Plan: Likely home early next week Needs at least 6 weeks IV abx for left great toe.patient now considering left great toe amputation.  Discussed with ID consult Dr. Orvan Falconer who recommends that IV vancomycin along with oral Flagyl and amoxicillin for a 6 weeks course should  be appropriate. I would order for a PICC line.  Consultants:  Orthopedics ( Dr Roda Shutters)   Procedures:  ABI   Antibiotics:  IV vancomycin and Zosyn (12/23.--)    HPI/Subjective:  Seen s/p surgery. Denies pain at this time. Discussed about osteomyelitis of left toe and prolonged IV abx. She report that she was too scared to have amputation of both the toes. Now wants to have the left great toe amputated too. will     Objective: Filed Vitals:   11/07/13 1540  BP: 193/108  Pulse: 90  Temp: 98.7 F (37.1 C)  Resp: 18    Intake/Output Summary (Last 24 hours) at 11/07/13 1617 Last data filed at 11/07/13 1315  Gross per 24 hour  Intake 4457.5 ml  Output      0 ml  Net 4457.5 ml   Filed Weights   11/04/13 1647  Weight: 65.772 kg (145 lb)    Exam:  General: Middle aged female in no acute distress  HEENT: No pallor, moist oral mucosa  Chest: Clear bilaterally, no added sounds  CVS: Normal S1 and S2, no murmurs rub or gallop  Abdomen: Soft, nontender, nondistended, bowel sounds present  Extremities: dressing over right foot.  cellulitis of left great toe. Distal pulses palpable, has normal sensation bilaterally  CNS: AAO x3   Data Reviewed: Basic Metabolic Panel:  Recent Labs Lab 11/04/13 1855 11/05/13 0512  NA 134* 136  K 4.0 3.8  CL 95* 99  CO2 29 29  GLUCOSE 243* 206*  BUN 7 7  CREATININE 0.71 0.92  CALCIUM 9.4 8.9   Liver Function Tests: No results  found for this basename: AST, ALT, ALKPHOS, BILITOT, PROT, ALBUMIN,  in the last 168 hours No results found for this basename: LIPASE, AMYLASE,  in the last 168 hours No results found for this basename: AMMONIA,  in the last 168 hours CBC:  Recent Labs Lab 11/04/13 1855 11/05/13 0512  WBC 9.4 8.3  HGB 11.0* 10.1*  HCT 31.8* 29.8*  MCV 90.3 90.9  PLT 428* 384   Cardiac Enzymes: No results found for this basename: CKTOTAL, CKMB, CKMBINDEX, TROPONINI,  in the last 168 hours BNP (last 3 results) No  results found for this basename: PROBNP,  in the last 8760 hours CBG:  Recent Labs Lab 11/07/13 0650 11/07/13 1057 11/07/13 1137 11/07/13 1341 11/07/13 1527  GLUCAP 170* 61* 121* 86 95    Recent Results (from the past 240 hour(s))  CULTURE, BLOOD (ROUTINE X 2)     Status: None   Collection Time    11/04/13  6:40 PM      Result Value Range Status   Specimen Description BLOOD LEFT HAND   Final   Special Requests BOTTLES DRAWN AEROBIC ONLY 5CC   Final   Culture  Setup Time     Final   Value: 11/05/2013 00:50     Performed at [facility]   Culture     Final   Value:        BLOOD CULTURE RECEIVED NO GROWTH TO DATE CULTURE WILL BE HELD FOR 5 DAYS BEFORE ISSUING A FINAL NEGATIVE REPORT     Performed at [facility]   Report Status PENDING   Incomplete  CULTURE, BLOOD (ROUTINE X 2)     Status: None   Collection Time    11/04/13  6:55 PM      Result Value Range Status   Specimen Description BLOOD LEFT HAND   Final   Special Requests BOTTLES DRAWN AEROBIC ONLY 5CC   Final   Culture  Setup Time     Final   Value: 11/05/2013 00:50     Performed at [facility]   Culture     Final   Value:        BLOOD CULTURE RECEIVED NO GROWTH TO DATE CULTURE WILL BE HELD FOR 5 DAYS BEFORE ISSUING A FINAL NEGATIVE REPORT     Performed at [facility]   Report Status PENDING   Incomplete  SURGICAL PCR SCREEN     Status: None   Collection Time    11/07/13  9:07 AM      Result Value Range Status   MRSA, PCR NEGATIVE  NEGATIVE Final   Staphylococcus aureus NEGATIVE  NEGATIVE Final   Comment:            The Xpert SA Assay (FDA     approved for NASAL specimens     in patients over 22 years of age),     is one component of     a comprehensive surveillance     program.  Test performance has     been validated by [employer] for patients greater     than or equal to 66 year old.     It is not intended     to diagnose infection nor to     guide or monitor  treatment.     Studies: No results found.  Scheduled Meds: . gadobenate dimeglumine  13.5 mL Intravenous Once  . heparin  5,000 Units Subcutaneous Q8H  .  insulin aspart  0-5 Units Subcutaneous QHS  . insulin aspart  0-9 Units Subcutaneous TID WC  . insulin glargine  15 Units Subcutaneous QHS  . piperacillin-tazobactam (ZOSYN)  IV  3.375 g Intravenous Q8H  . vancomycin  1,000 mg Intravenous Q12H   Continuous Infusions: . sodium chloride 75 mL/hr at 11/07/13 1215      Time spent: 25 minutes    Adoni Greenough  [facility] Pager [phone] If 7PM-7AM, please contact night-coverage at [email or web address], password [facility] 11/07/2013, 4:17 PM  LOS: 3 days

## 2013-11-07 NOTE — Op Note (Signed)
Date of surgery: 11/07/2013  Preoperative diagnosis: Right great toe osteomyelitis  Postoperative diagnosis: Same  Procedure: Partial amputation of right great toe   Surgeon: Glee Arvin, M.D.  Anesthesia: Gen.  Estimated blood loss: Minimal  Complications: None  Condition to PACU: Stable  Indication for procedure: Jennifer [name] is a 52 year old female with uncontrolled diabetes who presented to the hospital a few days ago with a draining right great toe wound. Subsequent studies revealed that she had erosive osteomyelitis of her great toe. The recommendation was to undergo the above mentioned procedure. The risks, benefits, and alternatives to surgery were discussed with the patient and she elected to undergo surgery.  Description of procedure: The patient was identified in the preoperative holding area. The operative site and procedure were confirmed with the patient and marked by the surgeon. She was brought back to the operating room. General anesthesia was induced. Right lower extremity was prepped and draped in standard sterile fashion. We use a medially based racquet type incision over the great toe. All infected tissue was excised. Full-thickness flaps were created. The distal phalanx appeared to be dead.  The proximal phalanx appeared to be without infection.  The toe was amputated through the level of the interphalangeal joint.  The wound is thoroughly irrigated with normal saline using cystoscopy tubing. Hemostasis was obtained. The wound was closed loosely with interrupted 3-0 nylon sutures.  A sterile dressing was applied. The patient awoke from anesthesia uneventfully and transferred to the PACU in stable condition.  Disposition:  Patient will be weightbearing as tolerated to her right heel only. She is not allowed to bear weight on her forefoot.  She will need long-term antibiotics for her left great toe ulcer.  She can followup with me in 2 weeks.    Jennifer Reel, MD [facility]  Orthopedics [phone] 1:18 PM

## 2013-11-08 LAB — GLUCOSE, CAPILLARY
Glucose-Capillary: 214 mg/dL — ABNORMAL HIGH (ref 70–99)
Glucose-Capillary: 277 mg/dL — ABNORMAL HIGH (ref 70–99)

## 2013-11-08 LAB — CREATININE, SERUM
Creatinine, Ser: 1 mg/dL (ref 0.50–1.10)
GFR calc non Af Amer: 65 mL/min — ABNORMAL LOW (ref >90)

## 2013-11-08 MED ORDER — AMLODIPINE BESYLATE 5 MG PO TABS
5.0000 mg | ORAL_TABLET | Freq: Every day | ORAL | Status: DC
  Administered 2013-11-08 – 2013-11-10 (×3): 5 mg via ORAL
  Filled 2013-11-08 (×3): qty 1

## 2013-11-08 MED ORDER — INSULIN ASPART 100 UNIT/ML ~~LOC~~ SOLN
0.0000 [IU] | Freq: Three times a day (TID) | SUBCUTANEOUS | Status: DC
  Administered 2013-11-08: 5 [IU] via SUBCUTANEOUS
  Administered 2013-11-09: 3 [IU] via SUBCUTANEOUS
  Administered 2013-11-09: 5 [IU] via SUBCUTANEOUS
  Administered 2013-11-10: 3 [IU] via SUBCUTANEOUS

## 2013-11-08 MED ORDER — INSULIN ASPART 100 UNIT/ML ~~LOC~~ SOLN
3.0000 [IU] | Freq: Three times a day (TID) | SUBCUTANEOUS | Status: DC
  Administered 2013-11-08 – 2013-11-09 (×2): 3 [IU] via SUBCUTANEOUS

## 2013-11-08 NOTE — Progress Notes (Signed)
[facility] PROGRESS NOTE  Jennifer [name] QIO:[id number] DOB: 05-05-1961 DOA: 11/04/2013 PCP: [name], MD  Brief narrative  52 year old female with uncontrolled diabetes mellitus sent in PCP office for worsening draining bilateral toe ulcers for possible weeks. Patient found to have osteomyelitis of distal phalanx oral bilateral great toes on imaging.   Assessment/Plan:  Osteomyelitis of bilateral great toes.( Rt worse than left)  Also noted for drainage wound and pathological fracture of the distal right phalanx.  Appreciate orthopedics consult. Patient initially planned for bilateral partial great toe amputation but patient refused to have her left toe amputated. She had partial amputation of the right great toe on 12/26. On repeat discussion she now agrees to have her left great toe amputated as well. Or planned for tomorrow... ABI normal bilaterally  -Continue empiric antibiotics with vancomycin and Zosyn. She received a total of 2 weeks of IV antibiotics he did plan for PICC line placement after surgery.  Uncontrolled diabetes mellitus  In the setting of medication noncompliance. Placed on 15 units of Lantus. If it's elevated this morning. I will continue current  dose of Lantus tonight as she would be in to him to midnight. I will add on premeal coverage.  Patient counseled on medication compliance   Hypertension  Blood pressure elevated postsurgically. Held blood pressure medication on 12/25 as blood pressure was low .  on when necessary hydralazine    DVT prophylaxis  Subcutaneous heparin  Diet: Diabetic  Code Status: Full code  Family Communication: None At bedside  Disposition Plan: Likely home early next week . Planned for OR tomorrow. Will order PICC line after surgery for 2 weeks abx.   Consultants:  Orthopedics ( Dr Roda Shutters)  Procedures:  ABI right partial greater toe amputation on 12/26   Antibiotics:  IV vancomycin and Zosyn (12/23.--)   HPI/Subjective:   Patient seen and examined this morning. No overnight issues. Seen by orthopedics consult in this patient of these to have her left great toe amputated water has been scheduled for tomorrow.     Objective: Filed Vitals:   11/08/13 0644  BP: 160/86  Pulse: 82  Temp: 99.3 F (37.4 C)  Resp: 18    Intake/Output Summary (Last 24 hours) at 11/08/13 1132 Last data filed at 11/08/13 3244  Gross per 24 hour  Intake   2100 ml  Output      0 ml  Net   2100 ml   Filed Weights   11/04/13 1647  Weight: 65.772 kg (145 lb)    Exam:  General: Middle aged female in no acute distress  HEENT: No pallor, moist oral mucosa  Chest: Clear bilaterally, no added sounds  CVS: Normal S1 and S2, no murmurs rub or gallop  Abdomen: Soft, nontender, nondistended, bowel sounds present  Extremities: dressing over right foot. cellulitis of left great toe.  CNS: AAO x3  Data Reviewed: Basic Metabolic Panel:  Recent Labs Lab 11/04/13 1855 11/05/13 0512  NA 134* 136  K 4.0 3.8  CL 95* 99  CO2 29 29  GLUCOSE 243* 206*  BUN 7 7  CREATININE 0.71 0.92  CALCIUM 9.4 8.9   Liver Function Tests: No results found for this basename: AST, ALT, ALKPHOS, BILITOT, PROT, ALBUMIN,  in the last 168 hours No results found for this basename: LIPASE, AMYLASE,  in the last 168 hours No results found for this basename: AMMONIA,  in the last 168 hours CBC:  Recent Labs Lab 11/04/13 1855 11/05/13 0512  WBC  9.4 8.3  HGB 11.0* 10.1*  HCT 31.8* 29.8*  MCV 90.3 90.9  PLT 428* 384   Cardiac Enzymes: No results found for this basename: CKTOTAL, CKMB, CKMBINDEX, TROPONINI,  in the last 168 hours BNP (last 3 results) No results found for this basename: PROBNP,  in the last 8760 hours CBG:  Recent Labs Lab 11/07/13 1341 11/07/13 1527 11/07/13 1620 11/07/13 2140 11/08/13 0631  GLUCAP 86 95 71 147* 251*    Recent Results (from the past 240 hour(s))  CULTURE, BLOOD (ROUTINE X 2)     Status: None    Collection Time    11/04/13  6:40 PM      Result Value Range Status   Specimen Description BLOOD LEFT HAND   Final   Special Requests BOTTLES DRAWN AEROBIC ONLY 5CC   Final   Culture  Setup Time     Final   Value: 11/05/2013 00:50     Performed at [facility]   Culture     Final   Value:        BLOOD CULTURE RECEIVED NO GROWTH TO DATE CULTURE WILL BE HELD FOR 5 DAYS BEFORE ISSUING A FINAL NEGATIVE REPORT     Performed at [facility]   Report Status PENDING   Incomplete  CULTURE, BLOOD (ROUTINE X 2)     Status: None   Collection Time    11/04/13  6:55 PM      Result Value Range Status   Specimen Description BLOOD LEFT HAND   Final   Special Requests BOTTLES DRAWN AEROBIC ONLY 5CC   Final   Culture  Setup Time     Final   Value: 11/05/2013 00:50     Performed at [facility]   Culture     Final   Value:        BLOOD CULTURE RECEIVED NO GROWTH TO DATE CULTURE WILL BE HELD FOR 5 DAYS BEFORE ISSUING A FINAL NEGATIVE REPORT     Performed at [facility]   Report Status PENDING   Incomplete  SURGICAL PCR SCREEN     Status: None   Collection Time    11/07/13  9:07 AM      Result Value Range Status   MRSA, PCR NEGATIVE  NEGATIVE Final   Staphylococcus aureus NEGATIVE  NEGATIVE Final   Comment:            The Xpert SA Assay (FDA     approved for NASAL specimens     in patients over 56 years of age),     is one component of     a comprehensive surveillance     program.  Test performance has     been validated by [employer] for patients greater     than or equal to 71 year old.     It is not intended     to diagnose infection nor to     guide or monitor treatment.     Studies: No results found.  Scheduled Meds: . amLODipine  5 mg Oral Daily  . gadobenate dimeglumine  13.5 mL Intravenous Once  . insulin aspart  0-5 Units Subcutaneous QHS  . insulin aspart  0-9 Units Subcutaneous TID WC  . insulin glargine  15 Units Subcutaneous QHS  .  piperacillin-tazobactam (ZOSYN)  IV  3.375 g Intravenous Q8H  . vancomycin  1,000 mg Intravenous Q12H   Continuous Infusions: . sodium chloride 75 mL/hr  at 11/07/13 1215      Time spent: 25 minutes    Eddie North  [facility] Pager [phone]. If 7PM-7AM, please contact night-coverage at [email or web address], password [facility] 11/08/2013, 11:32 AM  LOS: 4 days

## 2013-11-08 NOTE — Progress Notes (Signed)
   Subjective:  No events  Objective:   VITALS:   Filed Vitals:   11/07/13 1925 11/07/13 2032 11/08/13 0204 11/08/13 0644  BP: 164/94 139/94 132/51 160/86  Pulse:  121 92 82  Temp:  98.2 F (36.8 C) 98.7 F (37.1 C) 99.3 F (37.4 C)  TempSrc:  Oral Oral Oral  Resp:  19 18 18   Height:      Weight:      SpO2:  100% 99% 99%    RLE: - dressing intact - no drainage - foot WWP  LLE: - open great toe wound with drainage   Lab Results  Component Value Date   WBC 8.3 11/05/2013   HGB 10.1* 11/05/2013   HCT 29.8* 11/05/2013   MCV 90.9 11/05/2013   PLT 384 11/05/2013     Assessment/Plan: 1 Day Post-Op   Problem List Items Addressed This Visit     Cardiovascular and Mediastinum   Hypotension   Relevant Medications      hydrALAZINE (APRESOLINE) injection 10 mg      amLODipine (NORVASC) tablet 5 mg   Hypertension     Endocrine   DM2 (diabetes mellitus, type 2) (Chronic)   Relevant Medications      insulin glargine (LANTUS) 100 UNIT/ML injection      insulin aspart (novoLOG) injection 0-5 Units      insulin aspart (novoLOG) injection 0-9 Units      insulin glargine (LANTUS) injection 15 Units   *Diabetic osteomyelitis   Relevant Medications      vancomycin (VANCOCIN) IVPB 1000 mg/200 mL premix (Completed)      vancomycin (VANCOCIN) IVPB 1000 mg/200 mL premix   Type II or unspecified type diabetes mellitus with unspecified complication, uncontrolled     Other   Diabetic foot ulcer   Cellulitis   Pedal edema - Primary   Diabetic foot ulcers       - Plan for left great toe amputation tomorrow - NPO after midnight - discussed r/b/a to surgery   Cheral Almas 11/08/2013, 10:14 AM [phone]

## 2013-11-08 NOTE — Progress Notes (Signed)
ANTIBIOTIC CONSULT NOTE - FOLLOW UP  Pharmacy Consult for Vancomycin  Indication: Osteomyelitis, great toes  Recent Labs  11/06/13 1650 11/08/13 2139  VANCOTROUGH 28.2* 39.4*    Assessment: Vancomycin trough of 39.4, trough was drawn while vancomycin was infusing.   Plan:  -Re-check vancomycin trough 12/28 at 0930  Abran Duke 11/08/2013,11:32 PM

## 2013-11-09 ENCOUNTER — Encounter (HOSPITAL_COMMUNITY)
Admission: AD | Disposition: A | Discharge status: Home Health | Payer: Self-pay | Source: Ambulatory Visit | Attending: Internal Medicine

## 2013-11-09 ENCOUNTER — Encounter (HOSPITAL_COMMUNITY): Payer: BC Managed Care – PPO | Admitting: Anesthesiology

## 2013-11-09 ENCOUNTER — Inpatient Hospital Stay (HOSPITAL_COMMUNITY): Payer: BC Managed Care – PPO | Admitting: Anesthesiology

## 2013-11-09 ENCOUNTER — Encounter (HOSPITAL_COMMUNITY): Payer: Self-pay | Admitting: Anesthesiology

## 2013-11-09 DIAGNOSIS — E1169 Type 2 diabetes mellitus with other specified complication: Secondary | ICD-10-CM

## 2013-11-09 DIAGNOSIS — L97509 Non-pressure chronic ulcer of other part of unspecified foot with unspecified severity: Secondary | ICD-10-CM

## 2013-11-09 HISTORY — PX: TOE AMPUTATION: SHX809

## 2013-11-09 HISTORY — PX: AMPUTATION: SHX166

## 2013-11-09 LAB — GLUCOSE, CAPILLARY
Glucose-Capillary: 225 mg/dL — ABNORMAL HIGH (ref 70–99)
Glucose-Capillary: 204 mg/dL — ABNORMAL HIGH (ref 70–99)

## 2013-11-09 LAB — URINALYSIS, ROUTINE W REFLEX MICROSCOPIC
Leukocytes, UA: NEGATIVE
Protein, ur: NEGATIVE mg/dL
Specific Gravity, Urine: 1.021 (ref 1.005–1.030)
Urobilinogen, UA: 1 mg/dL (ref 0.0–1.0)

## 2013-11-09 LAB — URINE MICROSCOPIC-ADD ON

## 2013-11-09 SURGERY — AMPUTATION DIGIT
Anesthesia: General | Site: Foot | Laterality: Left

## 2013-11-09 MED ORDER — ONDANSETRON HCL 4 MG/2ML IJ SOLN: 4.0000 mg | Freq: Once | INTRAMUSCULAR | Status: DC | PRN

## 2013-11-09 MED ORDER — DOXYCYCLINE HYCLATE 100 MG PO TABS
100.0000 mg | ORAL_TABLET | Freq: Two times a day (BID) | ORAL | Status: DC
  Administered 2013-11-09 – 2013-11-10 (×3): 100 mg via ORAL
  Filled 2013-11-09 (×4): qty 1

## 2013-11-09 MED ORDER — PHENYLEPHRINE HCL 10 MG/ML IJ SOLN
INTRAMUSCULAR | Status: DC | PRN
  Administered 2013-11-09: 80 ug via INTRAVENOUS

## 2013-11-09 MED ORDER — ONDANSETRON HCL 4 MG/2ML IJ SOLN
INTRAMUSCULAR | Status: DC | PRN
  Administered 2013-11-09: 4 mg via INTRAVENOUS

## 2013-11-09 MED ORDER — HYDROMORPHONE HCL PF 1 MG/ML IJ SOLN: 0.2500 mg | INTRAMUSCULAR | Status: DC | PRN

## 2013-11-09 MED ORDER — ARTIFICIAL TEARS OP OINT
TOPICAL_OINTMENT | OPHTHALMIC | Status: DC | PRN
  Administered 2013-11-09 (×2): 1 via OPHTHALMIC

## 2013-11-09 MED ORDER — 0.9 % SODIUM CHLORIDE (POUR BTL) OPTIME
TOPICAL | Status: DC | PRN
  Administered 2013-11-09: 1000 mL

## 2013-11-09 MED ORDER — BUPIVACAINE HCL (PF) 0.25 % IJ SOLN
INTRAMUSCULAR | Status: AC
  Filled 2013-11-09: qty 30

## 2013-11-09 MED ORDER — FENTANYL CITRATE 0.05 MG/ML IJ SOLN
INTRAMUSCULAR | Status: DC | PRN
  Administered 2013-11-09 (×2): 50 ug via INTRAVENOUS

## 2013-11-09 MED ORDER — OXYCODONE HCL 5 MG/5ML PO SOLN: 5.0000 mg | Freq: Once | ORAL | Status: DC | PRN

## 2013-11-09 MED ORDER — SODIUM CHLORIDE 0.9 % IR SOLN
Status: DC | PRN
  Administered 2013-11-09: 3000 mL

## 2013-11-09 MED ORDER — PROPOFOL 10 MG/ML IV BOLUS
INTRAVENOUS | Status: DC | PRN
  Administered 2013-11-09: 200 mg via INTRAVENOUS

## 2013-11-09 MED ORDER — MIDAZOLAM HCL 5 MG/5ML IJ SOLN
INTRAMUSCULAR | Status: DC | PRN
  Administered 2013-11-09: 2 mg via INTRAVENOUS

## 2013-11-09 MED ORDER — HYDROCODONE-ACETAMINOPHEN 5-325 MG PO TABS
ORAL_TABLET | ORAL | Status: AC
  Administered 2013-11-09: 2 via ORAL
  Filled 2013-11-09: qty 2

## 2013-11-09 MED ORDER — LACTATED RINGERS IV SOLN
INTRAVENOUS | Status: DC | PRN
  Administered 2013-11-09: 07:00:00 via INTRAVENOUS

## 2013-11-09 MED ORDER — OXYCODONE HCL 5 MG PO TABS: 5.0000 mg | ORAL_TABLET | Freq: Once | ORAL | Status: DC | PRN

## 2013-11-09 MED ORDER — INSULIN ASPART 100 UNIT/ML ~~LOC~~ SOLN
4.0000 [IU] | Freq: Three times a day (TID) | SUBCUTANEOUS | Status: DC
  Administered 2013-11-09 – 2013-11-10 (×2): 4 [IU] via SUBCUTANEOUS

## 2013-11-09 MED ORDER — EPHEDRINE SULFATE 50 MG/ML IJ SOLN
INTRAMUSCULAR | Status: DC | PRN
  Administered 2013-11-09 (×2): 10 mg via INTRAVENOUS

## 2013-11-09 MED ORDER — DIPHENHYDRAMINE HCL 12.5 MG/5ML PO ELIX: 25.0000 mg | ORAL_SOLUTION | ORAL | Status: DC | PRN

## 2013-11-09 MED ORDER — LIDOCAINE HCL (CARDIAC) 20 MG/ML IV SOLN
INTRAVENOUS | Status: DC | PRN
  Administered 2013-11-09: 80 mg via INTRAVENOUS

## 2013-11-09 MED ORDER — INSULIN GLARGINE 100 UNIT/ML ~~LOC~~ SOLN
20.0000 [IU] | Freq: Every day | SUBCUTANEOUS | Status: DC
  Administered 2013-11-09: 20 [IU] via SUBCUTANEOUS
  Filled 2013-11-09 (×2): qty 0.2

## 2013-11-09 SURGICAL SUPPLY — 41 items
BANDAGE CONFORM 2  STR LF (GAUZE/BANDAGES/DRESSINGS) ×2 IMPLANT
BANDAGE CONFORM 3  STR LF (GAUZE/BANDAGES/DRESSINGS) IMPLANT
BLADE AVERAGE 25X9 (BLADE) IMPLANT
BNDG CMPR 9X4 STRL LF SNTH (GAUZE/BANDAGES/DRESSINGS) ×1
BNDG COHESIVE 4X5 TAN STRL (GAUZE/BANDAGES/DRESSINGS) ×1 IMPLANT
BNDG CONFORM 2 STRL LF (GAUZE/BANDAGES/DRESSINGS) ×1 IMPLANT
BNDG ESMARK 4X9 LF (GAUZE/BANDAGES/DRESSINGS) ×2 IMPLANT
BNDG GAUZE ELAST 4 BULKY (GAUZE/BANDAGES/DRESSINGS) ×1 IMPLANT
CORDS BIPOLAR (ELECTRODE) IMPLANT
COVER SURGICAL LIGHT HANDLE (MISCELLANEOUS) ×2 IMPLANT
CUFF TOURNIQUET SINGLE 18IN (TOURNIQUET CUFF) IMPLANT
CUFF TOURNIQUET SINGLE 24IN (TOURNIQUET CUFF) IMPLANT
CUFF TOURNIQUET SINGLE 34IN LL (TOURNIQUET CUFF) IMPLANT
DRAPE U-SHAPE 47X51 STRL (DRAPES) ×2 IMPLANT
DURAPREP 26ML APPLICATOR (WOUND CARE) ×2 IMPLANT
ELECT CAUTERY BLADE 6.4 (BLADE) ×2 IMPLANT
ELECT REM PT RETURN 9FT ADLT (ELECTROSURGICAL) ×2
ELECTRODE REM PT RTRN 9FT ADLT (ELECTROSURGICAL) ×1 IMPLANT
FACESHIELD LNG OPTICON STERILE (SAFETY) ×2 IMPLANT
GAUZE XEROFORM 1X8 LF (GAUZE/BANDAGES/DRESSINGS) ×1 IMPLANT
GLOVE SURG SS PI 7.5 STRL IVOR (GLOVE) ×2 IMPLANT
GOWN STRL NON-REIN LRG LVL3 (GOWN DISPOSABLE) ×2 IMPLANT
GOWN STRL REIN XL XLG (GOWN DISPOSABLE) ×3 IMPLANT
KIT BASIN OR (CUSTOM PROCEDURE TRAY) ×2 IMPLANT
KIT ROOM TURNOVER OR (KITS) ×2 IMPLANT
MANIFOLD NEPTUNE II (INSTRUMENTS) ×2 IMPLANT
NDL HYPO 25GX1X1/2 BEV (NEEDLE) IMPLANT
NEEDLE HYPO 25GX1X1/2 BEV (NEEDLE) IMPLANT
NS IRRIG 1000ML POUR BTL (IV SOLUTION) ×2 IMPLANT
PACK ORTHO EXTREMITY (CUSTOM PROCEDURE TRAY) ×2 IMPLANT
PAD ARMBOARD 7.5X6 YLW CONV (MISCELLANEOUS) ×4 IMPLANT
SET CYSTO W/LG BORE CLAMP LF (SET/KITS/TRAYS/PACK) ×1 IMPLANT
SPECIMEN JAR SMALL (MISCELLANEOUS) ×2 IMPLANT
SPONGE GAUZE 4X4 12PLY (GAUZE/BANDAGES/DRESSINGS) ×1 IMPLANT
SUT ETHILON 2 0 FS 18 (SUTURE) IMPLANT
SUT ETHILON 3 0 FSL (SUTURE) ×1 IMPLANT
SYR CONTROL 10ML LL (SYRINGE) IMPLANT
TOWEL OR 17X24 6PK STRL BLUE (TOWEL DISPOSABLE) ×2 IMPLANT
TOWEL OR 17X26 10 PK STRL BLUE (TOWEL DISPOSABLE) ×2 IMPLANT
TUBE CONNECTING 12X1/4 (SUCTIONS) IMPLANT
WATER STERILE IRR 1000ML POUR (IV SOLUTION) ×1 IMPLANT

## 2013-11-09 NOTE — Preoperative (Signed)
Beta Blockers   Reason not to administer Beta Blockers:Not Applicable 

## 2013-11-09 NOTE — Anesthesia Preprocedure Evaluation (Addendum)
Anesthesia Evaluation  Patient identified by MRN, date of birth, ID band Patient awake    Reviewed: Allergy & Precautions, H&P , NPO status , Patient's Chart, lab work & pertinent test results  Airway Mallampati: I TM Distance: >3 FB Neck ROM: Full    Dental  (+) Teeth Intact, Dental Advisory Given and Upper Dentures   Pulmonary neg pulmonary ROS,  breath sounds clear to auscultation        Cardiovascular hypertension, Rhythm:Regular Rate:Normal     Neuro/Psych negative neurological ROS     GI/Hepatic negative GI ROS,   Endo/Other  diabetes, Poorly Controlled, Type 2, Insulin Dependentcbg = 274  Renal/GU negative Renal ROS  negative genitourinary   Musculoskeletal   Abdominal   Peds negative pediatric ROS (+)  Hematology   Anesthesia Other Findings Lower lip, left side tear. Bleeding slightly. Lubricant applied.  Reproductive/Obstetrics negative OB ROS                         Anesthesia Physical Anesthesia Plan  ASA: II  Anesthesia Plan: General   Post-op Pain Management:    Induction: Intravenous  Airway Management Planned: LMA  Additional Equipment:   Intra-op Plan:   Post-operative Plan: Extubation in OR  Informed Consent: I have reviewed the patients History and Physical, chart, labs and discussed the procedure including the risks, benefits and alternatives for the proposed anesthesia with the patient or authorized representative who has indicated his/her understanding and acceptance.   Dental advisory given  Plan Discussed with: CRNA, Anesthesiologist and Surgeon  Anesthesia Plan Comments:         Anesthesia Quick Evaluation

## 2013-11-09 NOTE — Op Note (Signed)
Date of surgery: 11/09/2013   Preoperative diagnosis: Left great toe osteomyelitis   Postoperative diagnosis: Same   Procedure: Partial amputation of left great toe   Surgeon: Glee Arvin, M.D.   Anesthesia: Gen.   Estimated blood loss: Minimal   Complications: None   Condition to PACU: Stable   Indication for procedure: Jennifer [name] is a 52 year old female with uncontrolled diabetes who presented to the hospital a few days ago with a draining left great toe wound. Subsequent studies revealed that she had erosive osteomyelitis of her great toe. The recommendation was to undergo the above mentioned procedure. The risks, benefits, and alternatives to surgery were discussed with the patient and she elected to undergo surgery.   Description of procedure: The patient was identified in the preoperative holding area. The operative site and procedure were confirmed with the patient and marked by the surgeon. She was brought back to the operating room. General anesthesia was induced. Left lower extremity was prepped and draped in standard sterile fashion. We use a medially based racquet type incision over the great toe. All infected tissue was excised. Full-thickness flaps were created. The distal phalanx appeared to be dead. The proximal phalanx appeared to be without infection. The toe was amputated through the level of the interphalangeal joint. The wound is thoroughly irrigated with normal saline using cystoscopy tubing. Hemostasis was obtained. The wound was closed loosely with interrupted 3-0 nylon sutures. A sterile dressing was applied. The patient awoke from anesthesia uneventfully and transferred to the PACU in stable condition.   Disposition: Patient will be weightbearing as tolerated to her right heel only. She is not allowed to bear weight on her forefoot. She will not need long-term IV antibiotics for her toe ulcers as the remaining tissue appears to be healthy and normal.  I do recommend 4  weeks of po abx. She can followup with me in 2 weeks.   Mayra Reel, MD  [facility]  [phone]  1:18 PM

## 2013-11-09 NOTE — Progress Notes (Addendum)
[facility] PROGRESS NOTE  Jennifer [name] ZOX:[id number] DOB: 16-Feb-1961 DOA: 11/04/2013 PCP: [name], MD  Brief narrative  52 year old female with uncontrolled diabetes mellitus sent in PCP office for worsening draining bilateral toe ulcers for possible weeks. Patient found to have osteomyelitis of distal phalanx oral bilateral great toes on imaging.   Assessment/Plan:  Osteomyelitis of bilateral great toes.( Rt worse than left)  - noted for drainage wound and pathological fracture of the distal right phalanx.  Appreciate orthopedics consult. Right partial great toe amputation on 12/26 followed by left partial great toe amputation today ( patient refused to have amputation of both toe on same day) -Continue empiric antibiotics with vancomycin and Zosyn. Since remaining tissue proximal to amputated toe appears healthy she would not need further IV antibiotics and will discharge her on oral doxycycline for four weeks. Follow up with Dr Roda Shutters in 2 weeks -pain control.  Uncontrolled diabetes mellitus  In the setting of medication noncompliance. Placed on 15 units of Lantus. fsg still elevated. Will increase dose of lantus to 20 units and premeal aspart to 5 units tid.  Patient counseled on medication compliance   Hypertension  Blood pressure elevated postsurgically. Held blood pressure medication on 12/25 as blood pressure was low . on when necessary hydralazine . Added low dose amlodipine.   DVT prophylaxis  Subcutaneous heparin   Diet: Diabetic   Code Status: Full code   Family Communication: None At bedside . Discussed with daughter on 12/26  Disposition Plan: Likely home in 1-2 days if able to weight bear .   Consultants:  Orthopedics ( Dr Roda Shutters)  Procedures:  ABI right partial great toe amputation on 12/26 Left Partial great toe amputation on 12/28  Antibiotics:  IV vancomycin and Zosyn (12/23.--12/28 Oral doxy (12/28--)   HPI/Subjective:  Patient seen and  examined this morning. No overnight issues. Seen by orthopedics consult in this patient of these to have her left great toe amputated water has been scheduled for tomorrow.     Objective: Filed Vitals:   11/09/13 0954  BP: 153/87  Pulse: 86  Temp: 97.6 F (36.4 C)  Resp: 16    Intake/Output Summary (Last 24 hours) at 11/09/13 1309 Last data filed at 11/09/13 0845  Gross per 24 hour  Intake   1390 ml  Output     30 ml  Net   1360 ml   Filed Weights   11/04/13 1647  Weight: 65.772 kg (145 lb)    Exam:  General: Middle aged female in no acute distress  HEENT: No pallor, moist oral mucosa  Chest: Clear bilaterally, no added sounds  CVS: Normal S1 and S2, no murmurs rub or gallop  Abdomen: Soft, nontender, nondistended, bowel sounds present  Extremities: dressing over bilateral foot  CNS: AAO x3  Data Reviewed: Basic Metabolic Panel:  Recent Labs Lab 11/04/13 1855 11/05/13 0512 11/08/13 2139  NA 134* 136  --   K 4.0 3.8  --   CL 95* 99  --   CO2 29 29  --   GLUCOSE 243* 206*  --   BUN 7 7  --   CREATININE 0.71 0.92 1.00  CALCIUM 9.4 8.9  --    Liver Function Tests: No results found for this basename: AST, ALT, ALKPHOS, BILITOT, PROT, ALBUMIN,  in the last 168 hours No results found for this basename: LIPASE, AMYLASE,  in the last 168 hours No results found for this basename: AMMONIA,  in the last 168 hours  CBC:  Recent Labs Lab 11/04/13 1855 11/05/13 0512  WBC 9.4 8.3  HGB 11.0* 10.1*  HCT 31.8* 29.8*  MCV 90.3 90.9  PLT 428* 384   Cardiac Enzymes: No results found for this basename: CKTOTAL, CKMB, CKMBINDEX, TROPONINI,  in the last 168 hours BNP (last 3 results) No results found for this basename: PROBNP,  in the last 8760 hours CBG:  Recent Labs Lab 11/08/13 1603 11/08/13 2215 11/09/13 0728 11/09/13 0844 11/09/13 1053  GLUCAP 214* 277* 274* 225* 204*    Recent Results (from the past 240 hour(s))  CULTURE, BLOOD (ROUTINE X 2)      Status: None   Collection Time    11/04/13  6:40 PM      Result Value Range Status   Specimen Description BLOOD LEFT HAND   Final   Special Requests BOTTLES DRAWN AEROBIC ONLY 5CC   Final   Culture  Setup Time     Final   Value: 11/05/2013 00:50     Performed at [facility]   Culture     Final   Value:        BLOOD CULTURE RECEIVED NO GROWTH TO DATE CULTURE WILL BE HELD FOR 5 DAYS BEFORE ISSUING A FINAL NEGATIVE REPORT     Performed at [facility]   Report Status PENDING   Incomplete  CULTURE, BLOOD (ROUTINE X 2)     Status: None   Collection Time    11/04/13  6:55 PM      Result Value Range Status   Specimen Description BLOOD LEFT HAND   Final   Special Requests BOTTLES DRAWN AEROBIC ONLY 5CC   Final   Culture  Setup Time     Final   Value: 11/05/2013 00:50     Performed at [facility]   Culture     Final   Value:        BLOOD CULTURE RECEIVED NO GROWTH TO DATE CULTURE WILL BE HELD FOR 5 DAYS BEFORE ISSUING A FINAL NEGATIVE REPORT     Performed at [facility]   Report Status PENDING   Incomplete  SURGICAL PCR SCREEN     Status: None   Collection Time    11/07/13  9:07 AM      Result Value Range Status   MRSA, PCR NEGATIVE  NEGATIVE Final   Staphylococcus aureus NEGATIVE  NEGATIVE Final   Comment:            The Xpert SA Assay (FDA     approved for NASAL specimens     in patients over 4 years of age),     is one component of     a comprehensive surveillance     program.  Test performance has     been validated by [employer] for patients greater     than or equal to 64 year old.     It is not intended     to diagnose infection nor to     guide or monitor treatment.     Studies: No results found.  Scheduled Meds: . amLODipine  5 mg Oral Daily  . gadobenate dimeglumine  13.5 mL Intravenous Once  . insulin aspart  0-15 Units Subcutaneous TID WC  . insulin aspart  3 Units Subcutaneous TID WC  . insulin glargine  15 Units  Subcutaneous QHS  . piperacillin-tazobactam (ZOSYN)  IV  3.375 g Intravenous Q8H  . vancomycin  1,000  mg Intravenous Q12H   Continuous Infusions: . sodium chloride 75 mL/hr at 11/07/13 1215      Time spent: 25 minutes    Lekendrick Alpern  [facility] Pager [phone]. If 7PM-7AM, please contact night-coverage at [email or web address], password [facility] 11/09/2013, 1:09 PM  LOS: 5 days

## 2013-11-09 NOTE — Anesthesia Postprocedure Evaluation (Signed)
  Anesthesia Post-op Note  Patient: Jennifer [name]  Procedure(s) Performed: Procedure(s): AMPUTATION DIGIT - left great toe (Left)  Patient Location: PACU  Anesthesia Type:General  Level of Consciousness: awake, alert  and oriented  Airway and Oxygen Therapy: Patient Spontanous Breathing and Patient connected to nasal cannula oxygen  Post-op Pain: none  Post-op Assessment: Post-op Vital signs reviewed  Post-op Vital Signs: Reviewed  Complications: No apparent anesthesia complications

## 2013-11-09 NOTE — Transfer of Care (Signed)
Immediate Anesthesia Transfer of Care Note  Patient: Jennifer [name]  Procedure(s) Performed: Procedure(s): AMPUTATION DIGIT - left great toe (Left)  Patient Location: PACU  Anesthesia Type:General  Level of Consciousness: awake, oriented, sedated and patient cooperative  Airway & Oxygen Therapy: Patient Spontanous Breathing and Patient connected to nasal cannula oxygen  Post-op Assessment: Report given to PACU RN and Post -op Vital signs reviewed and stable  Post vital signs: Reviewed  Complications: No apparent anesthesia complications

## 2013-11-09 NOTE — Anesthesia Procedure Notes (Signed)
Procedure Name: LMA Insertion Date/Time: 11/09/2013 7:49 AM Performed by: Romie Minus K Pre-anesthesia Checklist: Patient identified, Emergency Drugs available, Suction available, Patient being monitored and Timeout performed Patient Re-evaluated:Patient Re-evaluated prior to inductionOxygen Delivery Method: Circle system utilized Preoxygenation: Pre-oxygenation with 100% oxygen Intubation Type: IV induction Ventilation: Mask ventilation without difficulty LMA: LMA inserted LMA Size: 4.0 Number of attempts: 1 Placement Confirmation: positive ETCO2,  CO2 detector and breath sounds checked- equal and bilateral Tube secured with: Tape Dental Injury: Teeth and Oropharynx as per pre-operative assessment

## 2013-11-09 NOTE — Progress Notes (Signed)
Clinical Child psychotherapist (CSW) received referral for SNF placement. CSW met with patient who confirmed her plans to go home with home health services. Case Management is aware. Please reconsult if further social work needs arise. CSW signing off.   Jetta Lout, LCSWA Weekend CSW [phone]

## 2013-11-09 NOTE — H&P (Signed)
H&P update  The surgical history has been reviewed and remains accurate without interval change.  The patient was re-examined and patient's physiologic condition has not changed significantly in the last 30 days. The condition still exists that makes this procedure necessary. The treatment plan remains the same, without new options for care.  No new pharmacological allergies or types of therapy has been initiated that would change the plan or the appropriateness of the plan.  The patient and/or family understand the potential benefits and risks.  Mayra Reel, MD 11/09/2013 7:33 AM

## 2013-11-09 NOTE — Progress Notes (Signed)
   CARE MANAGEMENT NOTE 11/09/2013  Patient:  NELA, BASCOM   Account Number:  [id number]  Date Initiated:  11/09/2013  Documentation initiated by:  [facility]  Subjective/Objective Assessment:   adm: toe pain and drainage  Osteomyelitis of bilateral great toes  AMPUTATION DIGIT - left great toe (Left     Action/Plan:   discharge planning   Anticipated DC Date:  11/12/2013   Anticipated DC Plan:  HOME W HOME HEALTH SERVICES      DC Planning Services  CM consult      [facility] Choice  HOME HEALTH   Choice offered to / List presented to:  C-1 Patient        HH arranged  HH-2 PT  HH-3 OT      Status of service:  In process, will continue to follow Medicare Important Message given?   (If response is "NO", the following Medicare IM given date fields will be blank) Date Medicare IM given:   Date Additional Medicare IM given:    Discharge Disposition:    Per UR Regulation:    If discussed at Long Length of Stay Meetings, dates discussed:    Comments:  11/09/13 14:00 CM received call from CSW who states pt wants to go home with [facility] rather than SNF.  CM spoke with pt who chooses [facility] for her home health needs at discharge. Address to receive [facility] services:  [address] [place], [place] [address].  Referral faxed to [facility].  CM will continue to monitor for discharge needs.  Freddy Jaksch, BSN, CM [phone].

## 2013-11-10 ENCOUNTER — Encounter (HOSPITAL_COMMUNITY): Payer: Self-pay | Admitting: General Practice

## 2013-11-10 LAB — GLUCOSE, CAPILLARY
Glucose-Capillary: 159 mg/dL — ABNORMAL HIGH (ref 70–99)
Glucose-Capillary: 159 mg/dL — ABNORMAL HIGH (ref 70–99)

## 2013-11-10 MED ORDER — INSULIN ASPART 100 UNIT/ML ~~LOC~~ SOLN: SUBCUTANEOUS | Status: DC

## 2013-11-10 MED ORDER — INSULIN ASPART 100 UNIT/ML ~~LOC~~ SOLN: 4.0000 [IU] | Freq: Three times a day (TID) | SUBCUTANEOUS | Status: DC

## 2013-11-10 MED ORDER — DOXYCYCLINE HYCLATE 100 MG PO TABS: 100.0000 mg | ORAL_TABLET | Freq: Two times a day (BID) | ORAL | Status: DC

## 2013-11-10 MED ORDER — AMLODIPINE BESYLATE 5 MG PO TABS: 5.0000 mg | ORAL_TABLET | Freq: Every day | ORAL | Status: DC

## 2013-11-10 MED ORDER — HYDROCODONE-ACETAMINOPHEN 5-325 MG PO TABS: 1.0000 | ORAL_TABLET | ORAL | Status: DC | PRN

## 2013-11-10 MED ORDER — INSULIN GLARGINE 100 UNIT/ML ~~LOC~~ SOLN: 20.0000 [IU] | Freq: Every day | SUBCUTANEOUS | Status: DC

## 2013-11-10 NOTE — Evaluation (Signed)
Physical Therapy Evaluation Patient Details Name: Jennifer [name] MRN: [id number] DOB: [birth year] Today's Date: 11/10/2013 Time: 4098-1191 PT Time Calculation (min): 24 min  PT Assessment / Plan / Recommendation History of Present Illness  52 year old female with uncontrolled diabetes mellitus sent in PCP office for worsening draining bilateral toe ulcers for possible weeks. Patient found to have osteomyelitis of distal phalanx oral bilateral great toes on imaging.  Right partial great toe amputation on 12/26 followed by left partial great toe amputation 12/28  Clinical Impression   Patient evaluated by Physical Therapy with no further acute PT needs identified. All education has been completed and the patient has no further questions.   Stressed importance of elevating LEs when not walking, and keeping tight control of blood sugars; Discussed primary care MD follow-up, as well as orthotist or podiatrist follow-up once healed for optimal shoes. PT is signing off. Thank you for this referral.  No OT needs noted -- notified OT.      PT Assessment  Patent does not need any further PT services    Follow Up Recommendations  No PT follow up    Does the patient have the potential to tolerate intense rehabilitation      Barriers to Discharge        Equipment Recommendations  None recommended by PT    Recommendations for Other Services Other (comment) (Diabetes Coord/dietician follow-up)   Frequency      Precautions / Restrictions Precautions Precautions: None Required Braces or Orthoses: Other Brace/Splint Other Brace/Splint: bil postop shoes Restrictions RLE Weight Bearing: Weight bearing as tolerated LLE Weight Bearing: Weight bearing as tolerated   Pertinent Vitals/Pain no apparent distress       Mobility  Bed Mobility Bed Mobility: Supine to Sit Supine to Sit: 7: Independent Transfers Transfers: Sit to Stand;Stand to Sit Sit to Stand: 7: Independent Stand to Sit:  7: Independent Ambulation/Gait Ambulation/Gait Assistance: 7: Independent Ambulation Distance (Feet): 200 Feet Assistive device: None Ambulation/Gait Assistance Details: WNL Gait Pattern: Within Functional Limits General Gait Details: in bil postop shoes Stairs: Yes Stairs Assistance: 6: Modified independent (Device/Increase time) Stair Management Technique: Two rails;Alternating pattern Number of Stairs: 5    Exercises     PT Diagnosis:    PT Problem List:   PT Treatment Interventions:       PT Goals(Current goals can be found in the care plan section) Acute Rehab PT Goals Patient Stated Goal: Get blood sugars under control, and she would like to wear cute shoes PT Goal Formulation: No goals set, d/c therapy  Visit Information  Last PT Received On: 11/10/13 Assistance Needed: +1 History of Present Illness: 52 year old female with uncontrolled diabetes mellitus sent in PCP office for worsening draining bilateral toe ulcers for possible weeks. Patient found to have osteomyelitis of distal phalanx oral bilateral great toes on imaging.  Right partial great toe amputation on 12/26 followed by left partial great toe amputation 12/28       Prior Functioning  Home Living Family/patient expects to be discharged to:: Private residence Living Arrangements: Spouse/significant other Available Help at Discharge: Family;Available PRN/intermittently Type of Home: House Home Access: Stairs to enter [employer] of Steps: 5 Entrance Stairs-Rails: Right;Left Home Layout: One level Home Equipment: None Prior Function Level of Independence: Independent Communication Communication: No difficulties    Cognition  Cognition Arousal/Alertness: Awake/alert Behavior During Therapy: WFL for tasks assessed/performed Overall Cognitive Status: Within Functional Limits for tasks assessed    Extremity/Trunk Assessment Upper Extremity Assessment Upper Extremity  Assessment: Overall WFL  for tasks assessed Lower Extremity Assessment Lower Extremity Assessment: Overall WFL for tasks assessed   Balance    End of Session PT - End of Session Activity Tolerance: Patient tolerated treatment well Patient left: in bed;with call bell/phone within reach;with family/visitor present (sitting EOB with feet elevated on chair, ottoman-style) Nurse Communication: Mobility status  GP     Olen Pel [place], [place] [phone]  11/10/2013, 1:06 PM

## 2013-11-10 NOTE — Progress Notes (Signed)
OT Cancellation Note  Patient Details Name: Jennifer [name] MRN: [id number] DOB: 01-Nov-1961   Cancelled Treatment:    Reason Eval/Treat Not Completed: OT screened, no needs identified, will sign off  Galen Manila 11/10/2013, 1:27 PM

## 2013-11-10 NOTE — Progress Notes (Signed)
Orthopedic Tech Progress Note Patient Details:  Jennifer [name] [birth year] [id number] Post op shoe fitted for Left LE. Application tolerated well.  Ortho Devices Type of Ortho Device: Postop shoe/boot Ortho Device/Splint Location: Left LE Ortho Device/Splint Interventions: Application   Asia R Thompson 11/10/2013, 10:20 AM

## 2013-11-10 NOTE — Care Management Note (Signed)
11/10/13   1:12pm No home health needs identified per PT/OT EVAL.  Vance Peper, RN BSN Case Manager

## 2013-11-10 NOTE — Discharge Summary (Signed)
Physician Discharge Summary  Jennifer [name] ZOX:[id number] DOB: 1961-05-27 DOA: 11/04/2013  PCP: [name], MD  Admit date: 11/04/2013 Discharge date: 11/10/2013  Time spent: 35 min minutes  Recommendations for Outpatient Follow-up:  1.   Discharge Diagnoses:  Principal Problem:   Diabetic osteomyelitis Active Problems:   DM2 (diabetes mellitus, type 2)   Hypertension   Pedal edema   Diabetic foot ulcers   Type II or unspecified type diabetes mellitus with unspecified complication, uncontrolled   Hypotension, unspecified   Discharge Condition: improved  Diet recommendation: diabetic  Filed Weights   11/04/13 1647  Weight: 65.772 kg (145 lb)    History of present illness:  Patient is a 52 year old female with uncontrolled diabetes was sent from PCPs office for worsening draining foot ulcers in the last 2 weeks. History was obtained from the patient who reported that the toe nail came off of both great toes 2 weeks ago and then she started having discomfort in the toes along with drainage. She noticed intermittent fever and chills for the last 2 weeks. Then she noticed that initially the drainage was yellowish, green, she was trying the wound care herself. Today she noticed that her who had been turning white and having bloody drainage. Patient was sent to the [facility] for further workup.  Patient also has been getting tight shoes for job which has been worsening the whole situation.   Hospital Course:  Osteomyelitis of bilateral great toes.( Rt worse than left)  - noted for drainage wound and pathological fracture of the distal right phalanx.  Right partial great toe amputation on 12/26 followed by left partial great toe amputation 12/28 ( patient refused to have amputation of both toe on same day)  -oral doxycycline for four weeks.  Follow up with Dr Roda Shutters in 2 weeks  -pain control.   Uncontrolled diabetes mellitus  In the setting of medication  noncompliance. P increase dose of lantus to 20 units and premeal aspart to 5 units tid. With SSI Patient counseled on medication compliance  -outpatient diabetic education  Hypertension  stable     Procedures: right partial great toe amputation on 12/26  Left Partial great toe amputation on 12/28   Consultations:  ortho  Discharge Exam: Filed Vitals:   11/10/13 0642  BP: 124/89  Pulse: 89  Temp: 98.3 F (36.8 C)  Resp: 16    General: A+Ox3, NAD Cardiovascular: rrr Respiratory: clear anterior  Discharge Instructions      Discharge Orders   Future Orders Complete By Expires   Diet - low sodium heart healthy  As directed    Diet Carb Modified  As directed    Discharge instructions  As directed    Comments:     Home health   Increase activity slowly  As directed        Medication List         amLODipine 5 MG tablet  Commonly known as:  NORVASC  Take 1 tablet (5 mg total) by mouth daily.     doxycycline 100 MG tablet  Commonly known as:  VIBRA-TABS  Take 1 tablet (100 mg total) by mouth every 12 (twelve) hours.     HYDROcodone-acetaminophen 5-325 MG per tablet  Commonly known as:  NORCO/VICODIN  Take 1-2 tablets by mouth every 4 (four) hours as needed for moderate pain.     insulin aspart 100 UNIT/ML injection  Commonly known as:  novoLOG  - TID with meals:  - CBG 70 -  120: 0 units  - CBG 121 - 150: 2 units  - CBG 151 - 200: 3 units  - CBG 201 - 250: 5 units  - CBG 251 - 300: 8 units  - CBG 301 - 350: 11 units  - CBG 351 - 400: 15 units     insulin aspart 100 UNIT/ML injection  Commonly known as:  novoLOG  Inject 4 Units into the skin 3 (three) times daily with meals.     insulin glargine 100 UNIT/ML injection  Commonly known as:  LANTUS  Inject 0.2 mLs (20 Units total) into the skin at bedtime.       No Known Allergies Follow-up Information   Follow up with Cheral Almas, MD In 2 weeks.   Specialty:  Orthopedic Surgery    Contact information:   [address] [place] [address] [phone]       Follow up with [name], MD In 1 week.   Specialty:  Internal Medicine   Contact information:   [address] [place] [address] [phone]        The results of significant diagnostics from this hospitalization (including imaging, microbiology, ancillary and laboratory) are listed below for reference.    Significant Diagnostic Studies: Mr Foot Right W Wo Contrast  11/05/2013   CLINICAL DATA:  Cellulitis and draining wound of the tip of the right great toe.  EXAM: MRI OF THE RIGHT FOREFOOT WITHOUT AND WITH CONTRAST  TECHNIQUE: Multiplanar, multisequence MR imaging was performed both before and after administration of intravenous contrast.  CONTRAST:  13.5 cc MultiHance  COMPARISON:  Radiograph dated 11/04/2013  FINDINGS: There is osteomyelitis of the entire and distal phalangeal bone of the great toe with a pathologic fracture. The draining wound at the tip of the toe extends into the bone and into the pathologic fracture. There is no enhancement of the soft tissues around the abscess which appears to have packing within it. The proximal phalangeal bone appears normal. There is only a tiny effusion in the IP joint.  Remainder of the forefoot demonstrates no significant abnormality.  IMPRESSION: Osteomyelitis of the distal phalangeal bone of the great toe with a pathologic fracture. The draining wound directly extends into the pathologic fracture.   Electronically Signed   By: Geanie Cooley M.D.   On: 11/05/2013 13:03   Mr Foot Left W Wo Contrast  11/05/2013   CLINICAL DATA:  Soft tissue wound at the tip of the left great toe with cellulitis. Drainage.  EXAM: MRI OF THE LEFT FOREFOOT WITHOUT AND WITH CONTRAST  TECHNIQUE: Multiplanar, multisequence MR imaging was performed both before and after administration of intravenous contrast.  CONTRAST:  13.31mL MULTIHANCE GADOBENATE DIMEGLUMINE 529  MG/ML IV SOLN  COMPARISON:  Radiographs dated 11/04/2013  FINDINGS: There is diffuse edema and diffuse abnormal enhancement of the distal phalangeal bone of the great toe. The soft tissue wound extends directly to the tuft of the bone. There is no definable bone destruction. There is no abscess. There is no effusion at the IP joint of the great toe.  There is minimal edema and enhancement in the distal aspect of the proximal phalanx of the great toe. The other visualized bones of the forefoot are normal.  There are no soft tissue abscesses.  IMPRESSION: Osteomyelitis of the distal phalangeal bone of the left great toe.   Electronically Signed   By: Geanie Cooley M.D.   On: 11/05/2013 12:55   Dg Foot Complete Left  11/04/2013   CLINICAL DATA:  Left great toe ulcer.  EXAM: LEFT FOOT - COMPLETE 3+ VIEW  COMPARISON:  None.  FINDINGS: Soft tissue irregularity of the distal left great toe. There is a small area of cortical disruption in the distal aspect of the 1st distal phalanx, medially. Arterial calcifications are noted. No soft tissue gas.  IMPRESSION: Small area of bone destruction in the distal aspect of the 1st distal phalanx, suspicious for early osteomyelitis.   Electronically Signed   By: Gordan Payment M.D.   On: 11/04/2013 18:46   Dg Foot Complete Right  11/04/2013   CLINICAL DATA:  Right great toe ulcer.  EXAM: RIGHT FOOT COMPLETE - 3+ VIEW  COMPARISON:  06/18/2013.  FINDINGS: Interval bone destruction and pathological fracture through the midportion of the 1st distal phalanx. The fracture is mildly comminuted with extension into the 1st IP joint. Distal and medial soft tissue irregularity and soft tissue swelling. Small amount of soft tissue gas medially and at the fracture site.  IMPRESSION: 1. Changes of osteomyelitis of the 1st distal phalanx with a comminuted pathological fracture with intra-articular extension. 2. Small amount of soft tissue gas, suspicious for infection with a gas-forming  organism.   Electronically Signed   By: Gordan Payment M.D.   On: 11/04/2013 18:48    Microbiology: Recent Results (from the past 240 hour(s))  CULTURE, BLOOD (ROUTINE X 2)     Status: None   Collection Time    11/04/13  6:40 PM      Result Value Range Status   Specimen Description BLOOD LEFT HAND   Final   Special Requests BOTTLES DRAWN AEROBIC ONLY 5CC   Final   Culture  Setup Time     Final   Value: 11/05/2013 00:50     Performed at [facility]   Culture     Final   Value:        BLOOD CULTURE RECEIVED NO GROWTH TO DATE CULTURE WILL BE HELD FOR 5 DAYS BEFORE ISSUING A FINAL NEGATIVE REPORT     Performed at [facility]   Report Status PENDING   Incomplete  CULTURE, BLOOD (ROUTINE X 2)     Status: None   Collection Time    11/04/13  6:55 PM      Result Value Range Status   Specimen Description BLOOD LEFT HAND   Final   Special Requests BOTTLES DRAWN AEROBIC ONLY 5CC   Final   Culture  Setup Time     Final   Value: 11/05/2013 00:50     Performed at [facility]   Culture     Final   Value:        BLOOD CULTURE RECEIVED NO GROWTH TO DATE CULTURE WILL BE HELD FOR 5 DAYS BEFORE ISSUING A FINAL NEGATIVE REPORT     Performed at [facility]   Report Status PENDING   Incomplete  SURGICAL PCR SCREEN     Status: None   Collection Time    11/07/13  9:07 AM      Result Value Range Status   MRSA, PCR NEGATIVE  NEGATIVE Final   Staphylococcus aureus NEGATIVE  NEGATIVE Final   Comment:            The Xpert SA Assay (FDA     approved for NASAL specimens     in patients over 63 years of age),     is one component of     a  comprehensive surveillance     program.  Test performance has     been validated by [facility] for patients greater     than or equal to 40 year old.     It is not intended     to diagnose infection nor to     guide or monitor treatment.     Labs: Basic Metabolic Panel:  Recent Labs Lab 11/04/13 1855 11/05/13 0512  11/08/13 2139  NA 134* 136  --   K 4.0 3.8  --   CL 95* 99  --   CO2 29 29  --   GLUCOSE 243* 206*  --   BUN 7 7  --   CREATININE 0.71 0.92 1.00  CALCIUM 9.4 8.9  --    Liver Function Tests: No results found for this basename: AST, ALT, ALKPHOS, BILITOT, PROT, ALBUMIN,  in the last 168 hours No results found for this basename: LIPASE, AMYLASE,  in the last 168 hours No results found for this basename: AMMONIA,  in the last 168 hours CBC:  Recent Labs Lab 11/04/13 1855 11/05/13 0512  WBC 9.4 8.3  HGB 11.0* 10.1*  HCT 31.8* 29.8*  MCV 90.3 90.9  PLT 428* 384   Cardiac Enzymes: No results found for this basename: CKTOTAL, CKMB, CKMBINDEX, TROPONINI,  in the last 168 hours BNP: BNP (last 3 results) No results found for this basename: PROBNP,  in the last 8760 hours CBG:  Recent Labs Lab 11/09/13 1053 11/09/13 1623 11/09/13 2151 11/10/13 0645 11/10/13 1100  GLUCAP 204* 175* 150* 159* 159*       Signed:  Jakaiya Netherland  [facility] 11/10/2013, 2:30 PM

## 2013-11-10 NOTE — Progress Notes (Signed)
  RD consulted for nutrition education regarding diabetes.   Lab Results  Component Value Date   HGBA1C 11.6* 11/04/2013    RD provided "Carbohydrate Counting for People with Diabetes" handout from the Academy of Nutrition and Dietetics. Discussed different food groups and their effects on blood sugar, emphasizing carbohydrate-containing foods. Provided list of carbohydrates and recommended serving sizes of common foods.  Discussed importance of controlled and consistent carbohydrate intake throughout the day. Provided examples of ways to balance meals/snacks and encouraged intake of high-fiber, whole grain complex carbohydrates. Teach back method used.  Expect fair compliance. Pt has meet with Diabetic Coordinator during admit and during previous admits. Was able to recall importance of portion sizes, diet drinks, and incorporates brown rice/bread into meals. Was willing to start utilizing carbohydrate counting, and balancing meals with heart healthy proteins  Body mass index is 22.71 kg/(m^2). Pt meets criteria for Normal weight based on current BMI.  Current diet order is Carb Modified, patient is consuming approximately 75% of meals at this time. Labs and medications reviewed. No further nutrition interventions warranted at this time. RD contact information provided. If additional nutrition issues arise, please re-consult RD.  Lloyd Huger MS RD LDN Clinical Dietitian Pager:[phone]

## 2013-11-10 NOTE — Progress Notes (Signed)
   Subjective:  Patient reports pain as none  Objective:   VITALS:   Filed Vitals:   11/09/13 1335 11/09/13 2007 11/10/13 0234 11/10/13 0642  BP: 125/70 130/82 122/83 124/89  Pulse: 76 82 81 89  Temp: 98.4 F (36.9 C) 98.3 F (36.8 C) 98.6 F (37 C) 98.3 F (36.8 C)  TempSrc:  Oral Oral Oral  Resp: 16 16 16 16   Height:      Weight:      SpO2: 99% 99% 96% 98%    Neurologically intact Neurovascular intact Intact pulses distally Dorsiflexion/Plantar flexion intact Incision: dressing C/D/I and no drainage No cellulitis present Compartment soft   Lab Results  Component Value Date   WBC 8.3 11/05/2013   HGB 10.1* 11/05/2013   HCT 29.8* 11/05/2013   MCV 90.9 11/05/2013   PLT 384 11/05/2013     Assessment/Plan: 1 Day Post-Op   Problem List Items Addressed This Visit     Cardiovascular and Mediastinum   Hypotension   Relevant Medications      hydrALAZINE (APRESOLINE) injection 10 mg      amLODipine (NORVASC) tablet 5 mg   Hypertension     Endocrine   DM2 (diabetes mellitus, type 2) (Chronic)   Relevant Medications      insulin glargine (LANTUS) 100 UNIT/ML injection      insulin aspart (novoLOG) injection 0-15 Units      insulin aspart (novoLOG) injection 4 Units      insulin glargine (LANTUS) injection 20 Units   *Diabetic osteomyelitis   Relevant Medications      vancomycin (VANCOCIN) IVPB 1000 mg/200 mL premix (Completed)   Type II or unspecified type diabetes mellitus with unspecified complication, uncontrolled     Other   Diabetic foot ulcer   Cellulitis   Pedal edema - Primary   Diabetic foot ulcers      Advance diet Up with PT/OT DVT ppx - SCDs, ambulation WBAT bilateral and lower extremity in postop shoes May dc home from ortho standpoint Agree with 4 wks of doxy F/u 2 wks in office   Cheral Almas 11/10/2013, 8:03 AM [phone]

## 2013-11-10 NOTE — Progress Notes (Signed)
Inpatient Diabetes Program Recommendations  AACE/ADA: New Consensus Statement on Inpatient Glycemic Control (2013)  Target Ranges:  Prepandial:   less than 140 mg/dL      Peak postprandial:   less than 180 mg/dL (1-2 hours)      Critically ill patients:  140 - 180 mg/dL   Consult received 16/10.  Diabetes Coordinator met with patient last week to discuss diabetes management at home.  Please see note 12/24.  Dietitian consult ordered for diet education.  Inpatient Diabetes Program Recommendations HgbA1C: =11.6 will need follow up with primary for tighter control after discharge Thank you  Piedad Climes BSN, RN,CDE Inpatient Diabetes Coordinator [phone] (team pager)

## 2013-11-11 ENCOUNTER — Encounter (HOSPITAL_COMMUNITY): Payer: Self-pay | Admitting: Orthopaedic Surgery

## 2013-11-11 LAB — CULTURE, BLOOD (ROUTINE X 2): Culture: NO GROWTH

## 2013-12-18 ENCOUNTER — Emergency Department (HOSPITAL_COMMUNITY): Payer: BC Managed Care – PPO

## 2013-12-18 ENCOUNTER — Emergency Department (HOSPITAL_COMMUNITY)
Admission: EM | Admit: 2013-12-18 | Discharge: 2013-12-18 | Disposition: A | Discharge status: Home / Self Care | Payer: BC Managed Care – PPO | Attending: Emergency Medicine | Admitting: Emergency Medicine

## 2013-12-18 ENCOUNTER — Encounter (HOSPITAL_COMMUNITY): Payer: Self-pay | Admitting: Emergency Medicine

## 2013-12-18 DIAGNOSIS — Z9851 Tubal ligation status: Secondary | ICD-10-CM | POA: Insufficient documentation

## 2013-12-18 DIAGNOSIS — S3981XA Other specified injuries of abdomen, initial encounter: Secondary | ICD-10-CM | POA: Insufficient documentation

## 2013-12-18 DIAGNOSIS — I10 Essential (primary) hypertension: Secondary | ICD-10-CM | POA: Insufficient documentation

## 2013-12-18 DIAGNOSIS — Z8719 Personal history of other diseases of the digestive system: Secondary | ICD-10-CM | POA: Insufficient documentation

## 2013-12-18 DIAGNOSIS — Y9389 Activity, other specified: Secondary | ICD-10-CM | POA: Insufficient documentation

## 2013-12-18 DIAGNOSIS — E119 Type 2 diabetes mellitus without complications: Secondary | ICD-10-CM | POA: Insufficient documentation

## 2013-12-18 DIAGNOSIS — Y92410 Unspecified street and highway as the place of occurrence of the external cause: Secondary | ICD-10-CM | POA: Insufficient documentation

## 2013-12-18 DIAGNOSIS — Z8739 Personal history of other diseases of the musculoskeletal system and connective tissue: Secondary | ICD-10-CM | POA: Insufficient documentation

## 2013-12-18 DIAGNOSIS — IMO0002 Reserved for concepts with insufficient information to code with codable children: Secondary | ICD-10-CM | POA: Insufficient documentation

## 2013-12-18 DIAGNOSIS — Z872 Personal history of diseases of the skin and subcutaneous tissue: Secondary | ICD-10-CM | POA: Insufficient documentation

## 2013-12-18 DIAGNOSIS — Z794 Long term (current) use of insulin: Secondary | ICD-10-CM | POA: Insufficient documentation

## 2013-12-18 DIAGNOSIS — Z9889 Other specified postprocedural states: Secondary | ICD-10-CM | POA: Insufficient documentation

## 2013-12-18 DIAGNOSIS — S298XXA Other specified injuries of thorax, initial encounter: Secondary | ICD-10-CM | POA: Insufficient documentation

## 2013-12-18 LAB — COMPREHENSIVE METABOLIC PANEL
ALT: 24 U/L (ref 0–35)
AST: 30 U/L (ref 0–37)
Albumin: 3.6 g/dL (ref 3.5–5.2)
Alkaline Phosphatase: 80 U/L (ref 39–117)
BUN: 19 mg/dL (ref 6–23)
CALCIUM: 9.7 mg/dL (ref 8.4–10.5)
CHLORIDE: 102 meq/L (ref 96–112)
CO2: 27 meq/L (ref 19–32)
CREATININE: 1 mg/dL (ref 0.50–1.10)
GFR, EST AFRICAN AMERICAN: 75 mL/min — AB (ref >90)
GFR, EST NON AFRICAN AMERICAN: 65 mL/min — AB (ref >90)
GLUCOSE: 137 mg/dL — AB (ref 70–99)
Potassium: 4.4 mEq/L (ref 3.7–5.3)
Sodium: 141 mEq/L (ref 137–147)
Total Bilirubin: 0.3 mg/dL (ref 0.3–1.2)
Total Protein: 7.2 g/dL (ref 6.0–8.3)

## 2013-12-18 LAB — CBC WITH DIFFERENTIAL/PLATELET
Basophils Absolute: 0 10*3/uL (ref 0.0–0.1)
Basophils Relative: 0 % (ref 0–1)
EOS PCT: 2 % (ref 0–5)
Eosinophils Absolute: 0.1 10*3/uL (ref 0.0–0.7)
HEMATOCRIT: 32.7 % — AB (ref 36.0–46.0)
HEMOGLOBIN: 11.2 g/dL — AB (ref 12.0–15.0)
LYMPHS ABS: 3.4 10*3/uL (ref 0.7–4.0)
Lymphocytes Relative: 56 % — ABNORMAL HIGH (ref 12–46)
MCH: 31.7 pg (ref 26.0–34.0)
MCHC: 34.3 g/dL (ref 30.0–36.0)
MCV: 92.6 fL (ref 78.0–100.0)
MONO ABS: 0.3 10*3/uL (ref 0.1–1.0)
Monocytes Relative: 5 % (ref 3–12)
NEUTROS ABS: 2.3 10*3/uL (ref 1.7–7.7)
Neutrophils Relative %: 38 % — ABNORMAL LOW (ref 43–77)
Platelets: 253 10*3/uL (ref 150–400)
RBC: 3.53 MIL/uL — AB (ref 3.87–5.11)
RDW: 12.7 % (ref 11.5–15.5)
WBC: 6.1 10*3/uL (ref 4.0–10.5)

## 2013-12-18 MED ORDER — KETOROLAC TROMETHAMINE 30 MG/ML IJ SOLN
30.0000 mg | Freq: Once | INTRAMUSCULAR | Status: AC
  Administered 2013-12-18: 30 mg via INTRAVENOUS
  Filled 2013-12-18: qty 1

## 2013-12-18 MED ORDER — SODIUM CHLORIDE 0.9 % IV BOLUS (SEPSIS)
1000.0000 mL | Freq: Once | INTRAVENOUS | Status: AC
  Administered 2013-12-18: 1000 mL via INTRAVENOUS

## 2013-12-18 MED ORDER — ONDANSETRON HCL 4 MG/2ML IJ SOLN
4.0000 mg | Freq: Once | INTRAMUSCULAR | Status: AC
  Administered 2013-12-18: 4 mg via INTRAVENOUS
  Filled 2013-12-18: qty 2

## 2013-12-18 MED ORDER — IOHEXOL 300 MG/ML  SOLN
100.0000 mL | Freq: Once | INTRAMUSCULAR | Status: AC | PRN
  Administered 2013-12-18: 100 mL via INTRAVENOUS

## 2013-12-18 MED ORDER — HYDROCODONE-ACETAMINOPHEN 5-325 MG PO TABS: 2.0000 | ORAL_TABLET | ORAL | Status: DC | PRN

## 2013-12-18 MED ORDER — LORAZEPAM 1 MG PO TABS
0.5000 mg | ORAL_TABLET | Freq: Once | ORAL | Status: AC
  Administered 2013-12-18: 0.5 mg via ORAL
  Filled 2013-12-18: qty 1

## 2013-12-18 MED ORDER — MORPHINE SULFATE 4 MG/ML IJ SOLN
4.0000 mg | Freq: Once | INTRAMUSCULAR | Status: AC
  Administered 2013-12-18: 4 mg via INTRAVENOUS
  Filled 2013-12-18: qty 1

## 2013-12-18 MED ORDER — LEVOFLOXACIN 750 MG PO TABS: 750.0000 mg | ORAL_TABLET | Freq: Every day | ORAL | Status: DC
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
## 2013-12-18 NOTE — ED Notes (Signed)
Resident requesting EKG on pt.  Stated he it would be ok to obtain EKG when pt returned from scans.
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
## 2013-12-19 NOTE — ED Provider Notes (Signed)
I saw and evaluated the patient, reviewed the resident's note and I agree with the findings and plan.  EKG Interpretation   None       See separate note for details.  Shon Batonourtney F Horton, MD 12/19/13 [phone]

## 2014-01-10 ENCOUNTER — Emergency Department (HOSPITAL_COMMUNITY): Payer: BC Managed Care – PPO

## 2014-01-10 ENCOUNTER — Inpatient Hospital Stay (HOSPITAL_COMMUNITY)
Admission: EM | Admit: 2014-01-10 | Discharge: 2014-01-15 | DRG: 256 | Disposition: A | Discharge status: Home Health | Payer: BC Managed Care – PPO | Attending: Internal Medicine | Admitting: Internal Medicine

## 2014-01-10 ENCOUNTER — Encounter (HOSPITAL_COMMUNITY): Payer: Self-pay | Admitting: Emergency Medicine

## 2014-01-10 DIAGNOSIS — R600 Localized edema: Secondary | ICD-10-CM

## 2014-01-10 DIAGNOSIS — E11621 Type 2 diabetes mellitus with foot ulcer: Secondary | ICD-10-CM

## 2014-01-10 DIAGNOSIS — E1169 Type 2 diabetes mellitus with other specified complication: Secondary | ICD-10-CM

## 2014-01-10 DIAGNOSIS — D638 Anemia in other chronic diseases classified elsewhere: Secondary | ICD-10-CM

## 2014-01-10 DIAGNOSIS — L089 Local infection of the skin and subcutaneous tissue, unspecified: Secondary | ICD-10-CM

## 2014-01-10 DIAGNOSIS — Z794 Long term (current) use of insulin: Secondary | ICD-10-CM

## 2014-01-10 DIAGNOSIS — L0291 Cutaneous abscess, unspecified: Secondary | ICD-10-CM

## 2014-01-10 DIAGNOSIS — I798 Other disorders of arteries, arterioles and capillaries in diseases classified elsewhere: Secondary | ICD-10-CM | POA: Diagnosis present

## 2014-01-10 DIAGNOSIS — I10 Essential (primary) hypertension: Secondary | ICD-10-CM | POA: Diagnosis present

## 2014-01-10 DIAGNOSIS — L0390 Cellulitis, unspecified: Secondary | ICD-10-CM

## 2014-01-10 DIAGNOSIS — M79609 Pain in unspecified limb: Secondary | ICD-10-CM

## 2014-01-10 DIAGNOSIS — M86179 Other acute osteomyelitis, unspecified ankle and foot: Secondary | ICD-10-CM | POA: Diagnosis present

## 2014-01-10 DIAGNOSIS — E118 Type 2 diabetes mellitus with unspecified complications: Secondary | ICD-10-CM

## 2014-01-10 DIAGNOSIS — E119 Type 2 diabetes mellitus without complications: Secondary | ICD-10-CM

## 2014-01-10 DIAGNOSIS — E1165 Type 2 diabetes mellitus with hyperglycemia: Secondary | ICD-10-CM

## 2014-01-10 DIAGNOSIS — M869 Osteomyelitis, unspecified: Secondary | ICD-10-CM | POA: Diagnosis present

## 2014-01-10 DIAGNOSIS — E1149 Type 2 diabetes mellitus with other diabetic neurological complication: Secondary | ICD-10-CM | POA: Diagnosis present

## 2014-01-10 DIAGNOSIS — K589 Irritable bowel syndrome without diarrhea: Secondary | ICD-10-CM | POA: Diagnosis present

## 2014-01-10 DIAGNOSIS — M7989 Other specified soft tissue disorders: Secondary | ICD-10-CM

## 2014-01-10 DIAGNOSIS — K3184 Gastroparesis: Secondary | ICD-10-CM | POA: Diagnosis present

## 2014-01-10 DIAGNOSIS — Z833 Family history of diabetes mellitus: Secondary | ICD-10-CM

## 2014-01-10 DIAGNOSIS — Z9851 Tubal ligation status: Secondary | ICD-10-CM

## 2014-01-10 DIAGNOSIS — E11628 Type 2 diabetes mellitus with other skin complications: Secondary | ICD-10-CM | POA: Diagnosis present

## 2014-01-10 DIAGNOSIS — IMO0002 Reserved for concepts with insufficient information to code with codable children: Secondary | ICD-10-CM

## 2014-01-10 DIAGNOSIS — E1140 Type 2 diabetes mellitus with diabetic neuropathy, unspecified: Secondary | ICD-10-CM | POA: Diagnosis present

## 2014-01-10 DIAGNOSIS — L97509 Non-pressure chronic ulcer of other part of unspecified foot with unspecified severity: Secondary | ICD-10-CM | POA: Diagnosis present

## 2014-01-10 DIAGNOSIS — Z820 Family history of epilepsy and other diseases of the nervous system: Secondary | ICD-10-CM

## 2014-01-10 DIAGNOSIS — S98139A Complete traumatic amputation of one unspecified lesser toe, initial encounter: Secondary | ICD-10-CM

## 2014-01-10 DIAGNOSIS — E1159 Type 2 diabetes mellitus with other circulatory complications: Principal | ICD-10-CM | POA: Diagnosis present

## 2014-01-10 DIAGNOSIS — M9080 Osteopathy in diseases classified elsewhere, unspecified site: Secondary | ICD-10-CM | POA: Diagnosis present

## 2014-01-10 LAB — BASIC METABOLIC PANEL
BUN: 18 mg/dL (ref 6–23)
CO2: 24 mEq/L (ref 19–32)
Calcium: 9.2 mg/dL (ref 8.4–10.5)
Chloride: 101 mEq/L (ref 96–112)
Creatinine, Ser: 0.83 mg/dL (ref 0.50–1.10)
GFR, EST NON AFRICAN AMERICAN: 81 mL/min — AB (ref >90)
Glucose, Bld: 139 mg/dL — ABNORMAL HIGH (ref 70–99)
Potassium: 3.8 mEq/L (ref 3.7–5.3)
Sodium: 139 mEq/L (ref 137–147)

## 2014-01-10 LAB — CBC WITH DIFFERENTIAL/PLATELET
BASOS PCT: 0 % (ref 0–1)
Basophils Absolute: 0 10*3/uL (ref 0.0–0.1)
EOS ABS: 0.2 10*3/uL (ref 0.0–0.7)
Eosinophils Relative: 2 % (ref 0–5)
HCT: 27.3 % — ABNORMAL LOW (ref 36.0–46.0)
Hemoglobin: 9.3 g/dL — ABNORMAL LOW (ref 12.0–15.0)
Lymphocytes Relative: 22 % (ref 12–46)
Lymphs Abs: 1.9 10*3/uL (ref 0.7–4.0)
MCH: 30.4 pg (ref 26.0–34.0)
MCHC: 34.1 g/dL (ref 30.0–36.0)
MCV: 89.2 fL (ref 78.0–100.0)
Monocytes Absolute: 0.9 10*3/uL (ref 0.1–1.0)
Monocytes Relative: 11 % (ref 3–12)
NEUTROS PCT: 65 % (ref 43–77)
Neutro Abs: 5.5 10*3/uL (ref 1.7–7.7)
Platelets: 300 10*3/uL (ref 150–400)
RBC: 3.06 MIL/uL — AB (ref 3.87–5.11)
RDW: 11.9 % (ref 11.5–15.5)
WBC: 8.6 10*3/uL (ref 4.0–10.5)

## 2014-01-10 LAB — CK: Total CK: 250 U/L — ABNORMAL HIGH (ref 7–177)

## 2014-01-10 LAB — GLUCOSE, CAPILLARY: Glucose-Capillary: 186 mg/dL — ABNORMAL HIGH (ref 70–99)

## 2014-01-10 LAB — SEDIMENTATION RATE: SED RATE: 93 mm/h — AB (ref 0–22)

## 2014-01-10 MED ORDER — PIPERACILLIN-TAZOBACTAM 3.375 G IVPB
3.3750 g | Freq: Three times a day (TID) | INTRAVENOUS | Status: DC
  Administered 2014-01-11 – 2014-01-15 (×13): 3.375 g via INTRAVENOUS
  Filled 2014-01-10 (×16): qty 50

## 2014-01-10 MED ORDER — SODIUM CHLORIDE 0.9 % IV SOLN
INTRAVENOUS | Status: DC
  Administered 2014-01-10: 18:00:00 via INTRAVENOUS

## 2014-01-10 MED ORDER — ONDANSETRON HCL 4 MG/2ML IJ SOLN: 4.0000 mg | Freq: Four times a day (QID) | INTRAMUSCULAR | Status: DC | PRN

## 2014-01-10 MED ORDER — SODIUM CHLORIDE 0.9 % IV BOLUS (SEPSIS)
500.0000 mL | Freq: Once | INTRAVENOUS | Status: AC
  Administered 2014-01-10: 500 mL via INTRAVENOUS

## 2014-01-10 MED ORDER — ACETAMINOPHEN 650 MG RE SUPP: 650.0000 mg | Freq: Four times a day (QID) | RECTAL | Status: DC | PRN

## 2014-01-10 MED ORDER — ENOXAPARIN SODIUM 40 MG/0.4ML ~~LOC~~ SOLN
40.0000 mg | SUBCUTANEOUS | Status: DC
  Administered 2014-01-10 – 2014-01-12 (×3): 40 mg via SUBCUTANEOUS
  Filled 2014-01-10 (×4): qty 0.4

## 2014-01-10 MED ORDER — INSULIN ASPART 100 UNIT/ML ~~LOC~~ SOLN
0.0000 [IU] | Freq: Every day | SUBCUTANEOUS | Status: DC
  Administered 2014-01-12: 1 [IU] via SUBCUTANEOUS
  Administered 2014-01-13: 2 [IU] via SUBCUTANEOUS

## 2014-01-10 MED ORDER — INSULIN GLARGINE 100 UNIT/ML ~~LOC~~ SOLN
10.0000 [IU] | Freq: Every day | SUBCUTANEOUS | Status: DC
  Administered 2014-01-10 – 2014-01-13 (×4): 10 [IU] via SUBCUTANEOUS
  Filled 2014-01-10 (×6): qty 0.1

## 2014-01-10 MED ORDER — PIPERACILLIN-TAZOBACTAM 3.375 G IVPB 30 MIN
3.3750 g | Freq: Once | INTRAVENOUS | Status: AC
  Administered 2014-01-10: 3.375 g via INTRAVENOUS
  Filled 2014-01-10: qty 50

## 2014-01-10 MED ORDER — VANCOMYCIN HCL IN DEXTROSE 1-5 GM/200ML-% IV SOLN
1000.0000 mg | Freq: Two times a day (BID) | INTRAVENOUS | Status: DC
  Administered 2014-01-10 – 2014-01-13 (×6): 1000 mg via INTRAVENOUS
  Filled 2014-01-10 (×7): qty 200

## 2014-01-10 MED ORDER — INSULIN ASPART 100 UNIT/ML ~~LOC~~ SOLN
0.0000 [IU] | Freq: Three times a day (TID) | SUBCUTANEOUS | Status: DC
  Administered 2014-01-11: 5 [IU] via SUBCUTANEOUS
  Administered 2014-01-12 (×2): 3 [IU] via SUBCUTANEOUS
  Administered 2014-01-12 – 2014-01-13 (×2): 5 [IU] via SUBCUTANEOUS
  Administered 2014-01-13: 3 [IU] via SUBCUTANEOUS
  Administered 2014-01-14 (×2): 5 [IU] via SUBCUTANEOUS
  Administered 2014-01-14: 3 [IU] via SUBCUTANEOUS
  Administered 2014-01-15: 9 [IU] via SUBCUTANEOUS
  Administered 2014-01-15: 2 [IU] via SUBCUTANEOUS

## 2014-01-10 MED ORDER — ACETAMINOPHEN 325 MG PO TABS
650.0000 mg | ORAL_TABLET | Freq: Four times a day (QID) | ORAL | Status: DC | PRN
  Administered 2014-01-10 – 2014-01-11 (×2): 650 mg via ORAL
  Filled 2014-01-10 (×2): qty 2

## 2014-01-10 MED ORDER — SODIUM CHLORIDE 0.9 % IV SOLN: INTRAVENOUS | Status: DC

## 2014-01-10 MED ORDER — ONDANSETRON HCL 4 MG PO TABS: 4.0000 mg | ORAL_TABLET | Freq: Four times a day (QID) | ORAL | Status: DC | PRN

## 2014-01-10 MED ORDER — SODIUM CHLORIDE 0.9 % IV SOLN
INTRAVENOUS | Status: DC
  Administered 2014-01-10: 22:00:00 via INTRAVENOUS

## 2014-01-10 NOTE — ED Notes (Signed)
Pt transported to xray 

## 2014-01-10 NOTE — ED Notes (Signed)
Pt c/o blisters to right pinky toe and wound to side of right foot. Pt has history of diabetes. Pt also c/o swelling to right foot and ankle. Pt denies shortness of breath or chest pain.

## 2014-01-10 NOTE — Consult Note (Addendum)
ANTIBIOTIC CONSULT NOTE - INITIAL  Pharmacy Consult for Vancomycin; add Zosyn Indication: osteomyelitis  No Known Allergies  Patient Measurements: Height: 5' 6.93" (170 cm) Weight: 154 lb 6.4 oz (70.035 kg) IBW/kg (Calculated) : 61.44  Vital Signs: Temp: 98.3 F (36.8 C) (02/28 1632) Temp src: Oral (02/28 1632) BP: 148/77 mmHg (02/28 1949) Pulse Rate: 90 (02/28 1950) Intake/Output from previous day:   Intake/Output from this shift:    Labs:  Recent Labs  01/10/14 1730  WBC 8.6  HGB 9.3*  PLT 300  CREATININE 0.83   Estimated Creatinine Clearance: 78.6 ml/min (by C-G formula based on Cr of 0.83).  Microbiology: No results found for this or any previous visit (from the past 720 hour(s)).  Medical History: Past Medical History  Diagnosis Date  . Diabetes mellitus   . Ulcer of lower limb, unspecified     ulcer on top of foot (right)  . Slow transit constipation   . Disorder of bone and cartilage, unspecified   . Unspecified essential hypertension   . Orthostatic hypotension   . Reflux esophagitis   . Gastroparesis   . Irritable bowel syndrome   . Flatulence, eructation, and gas pain   . Complication of anesthesia   . PONV (postoperative nausea and vomiting)    Assessment: 52yof with hx uncontrolled diabetes s/p amputation of right distal phalanx great toe presents to the ED with right foot swelling. Xray highly suspicious for osteomyelitis. She will begin vancomycin.  2 months ago she was admitted with osteomyelitis and was treated with vancomycin 750mg  q8 which produced a supratherapeutic trough. Dose was changed to 1g q12. Renal function was similar then as it is now.    Goal of Therapy:  Vancomycin trough level 15-20 mcg/ml  Plan:  1) Vancomycin 1g IV q12 2) Follow renal function, cultures, LOT, level at steady state  Fredrik RiggerMarkle, Drucella Karbowski Sue 01/10/2014,7:58 PM    Addendum: Now adding zosyn. Will dose zosyn 3.375g IV q8 for CrCl 3178ml/min.  Fredrik RiggerMarkle,  Delainey Winstanley Sue 01/10/2014, 8:57 PM

## 2014-01-10 NOTE — ED Notes (Signed)
The pt is a diabetic and has swelling with toe ulcers rt second and rt little toes. The entire foot is swollen and red and the redness and swelling extends up the leg to just below the rt knee. No pain

## 2014-01-10 NOTE — ED Notes (Signed)
Dr. Patel at bedside 

## 2014-01-10 NOTE — ED Notes (Signed)
Pt to xray

## 2014-01-10 NOTE — Progress Notes (Signed)
VASCULAR LAB PRELIMINARY  PRELIMINARY  PRELIMINARY  PRELIMINARY  Right lower extremity venous Doppler completed.    Preliminary report:  There is no DVT or SVT noted in the right lower extremity.  There is an enlarged lymph node noted in the right groin.  Bronda Alfred, RVT 01/10/2014, 7:07 PM

## 2014-01-10 NOTE — ED Notes (Signed)
The pt reports that she has had this problem for 2 weeks.  dopplered pulse foot wram to the touch

## 2014-01-10 NOTE — Progress Notes (Signed)
Pt was admitted to unit from ED. Pt is alert and oriented. Pt has 2+ edema to the right leg with partial amputation to left and right big toe, and necrotic second right toe and a diabetic wound measuring 2cm round and deep to the right pinky toes. Dry dressing applied per MD orders. Pt oriented to room, call bell within reach and bed in lowest position. Will continue to monitor.   Blayde Bacigalupi J. Lendell CapriceSullivan RN

## 2014-01-10 NOTE — H&P (Signed)
[facility] History and Physical  Patient: Jennifer [name]  QQI:[id number]  DOB: 1961-02-15  DOS: the patient was seen and examined on 01/10/2014 PCP: Blanchie Serve, MD  Chief Complaint: leg wounds  HPI: Jennifer [name] is a 53 y.o. female with Past medical history of diabetes melitus, right great toe osteomyelitis. The patient is coming from home. The patient presented with complaints of right fifth toe discharge and ulceration. 2 weeks ago she noted that there was a small blister on her fifth toe which gradually progressed into a large blister and then started draining to 3 days ago. She has been walking around and wears steel toe shoes and denies any injury to her legs. She also denies any neuropathy. She has noted that there is swelling of her right leg but does not have any tenderness in her leg or ankle or knee joint. Denies any fever chills constitutional symptoms. Pt denies any fever, chills, headache, cough, chest pain, palpitation, shortness of breath, orthopnea, PND, nausea, vomiting, abdominal pain, diarrhea, constipation, active bleeding, burning urination, dizziness, focal neurological deficit.  Mentions her Fasting sugar has been above 110 all the time.And she is compliant with her Lantus and sliding scale insulin.  Review of Systems: as mentioned in the history of present illness.  A Comprehensive review of the other systems is negative.  Past Medical History  Diagnosis Date  . Diabetes mellitus   . Ulcer of lower limb, unspecified     ulcer on top of foot (right)  . Slow transit constipation   . Disorder of bone and cartilage, unspecified   . Unspecified essential hypertension   . Orthostatic hypotension   . Reflux esophagitis   . Gastroparesis   . Irritable bowel syndrome   . Flatulence, eructation, and gas pain   . Complication of anesthesia   . PONV (postoperative nausea and vomiting)    Past Surgical History  Procedure Laterality Date  .  Esophagogastroduodenoscopy  08/11/2012    Procedure: ESOPHAGOGASTRODUODENOSCOPY (EGD);  Surgeon: Lear Ng, MD;  Location: [facility];  Service: Endoscopy;  Laterality: N/A;  . Tubal ligation    . Toe amputation Left 11/09/2013    partial amputation of great left toe   . Amputation Right 11/07/2013    Procedure: AMPUTATION DIGIT - Right great toe;  Surgeon: Marianna Payment, MD;  Location: [facility];  Service: Orthopedics;  Laterality: Right;  . Amputation Left 11/09/2013    Procedure: AMPUTATION DIGIT - left great toe;  Surgeon: Marianna Payment, MD;  Location: [facility];  Service: Orthopedics;  Laterality: Left;   Social History:  reports that she has never smoked. She has never used smokeless tobacco. She reports that she does not drink alcohol or use illicit drugs. Independent for most of her  ADL.  No Known Allergies  Family History  Problem Relation Age of Onset  . Diabetes Mother   . Alzheimer's disease Father     Prior to Admission medications   Medication Sig Start Date End Date Taking? Authorizing Provider  insulin aspart (NOVOLOG) 100 UNIT/ML injection Inject 3-15 Units into the skin 3 (three) times daily before meals. *per sliding scale*   Yes Historical Provider, MD  insulin glargine (LANTUS) 100 UNIT/ML injection Inject 10 Units into the skin at bedtime.   Yes Historical Provider, MD    Physical Exam: Filed Vitals:   01/10/14 1632 01/10/14 1811 01/10/14 1949 01/10/14 1950  BP: 139/78 165/91 148/77   Pulse: 101 94  90  Temp:  98.3 F (36.8 C)     TempSrc: Oral     Resp: 18 16    Height:    5' 6.93" (1.7 m)  Weight: 70.035 kg (154 lb 6.4 oz)     SpO2: 100% 98%  100%    General: Alert, Awake and Oriented to Time, Place and Person. Appear in mild distress Eyes: PERRL ENT: Oral Mucosa clear moist. Neck: no JVD Cardiovascular: S1 and S2 Present, no Murmur, Peripheral Pulses Present Respiratory: Bilateral Air entry equal and Decreased, Clear to Auscultation,   no Crackles,no wheezes Abdomen: Bowel Sound Present, Soft and Non tender Skin: no Rash Extremities: right Pedal edema, no calf tenderness, right great to amputated, second toe has open 3 mm ulcer with swelling and slough, right fifth toe skin sloughing and open ulcer with swelling. Neurologic: Grossly Unremarkable.  Labs on Admission:  CBC:  Recent Labs Lab 01/10/14 1730  WBC 8.6  NEUTROABS 5.5  HGB 9.3*  HCT 27.3*  MCV 89.2  PLT 300    CMP     Component Value Date/Time   NA 139 01/10/2014 1730   NA 138 06/18/2013 1654   K 3.8 01/10/2014 1730   CL 101 01/10/2014 1730   CO2 24 01/10/2014 1730   GLUCOSE 139* 01/10/2014 1730   GLUCOSE 284* 06/18/2013 1654   BUN 18 01/10/2014 1730   BUN 18 06/18/2013 1654   CREATININE 0.83 01/10/2014 1730   CALCIUM 9.2 01/10/2014 1730   PROT 7.2 12/18/2013 1610   PROT 6.8 05/14/2013 1528   ALBUMIN 3.6 12/18/2013 1610   AST 30 12/18/2013 1610   ALT 24 12/18/2013 1610   ALKPHOS 80 12/18/2013 1610   BILITOT 0.3 12/18/2013 1610   GFRNONAA 81* 01/10/2014 1730   GFRAA >90 01/10/2014 1730    No results found for this basename: LIPASE, AMYLASE,  in the last 168 hours No results found for this basename: AMMONIA,  in the last 168 hours  No results found for this basename: CKTOTAL, CKMB, CKMBINDEX, TROPONINI,  in the last 168 hours BNP (last 3 results) No results found for this basename: PROBNP,  in the last 8760 hours  Radiological Exams on Admission: Dg Foot Complete Right  01/10/2014   CLINICAL DATA:  Diabetes, little toe infection  EXAM: RIGHT FOOT COMPLETE - 3+ VIEW  COMPARISON:  11/04/2013  FINDINGS: Three views of the right foot submitted. The patient is status post amputation of distal phalanx great toe. There is soft tissue swelling fifth toe. There is cortical irregularity lateral aspect of distal phalanx fifth toe. There is soft tissue defect adjacent to distal phalanx. Findings are highly suspicious for osteomyelitis. No acute fracture or subluxation.   IMPRESSION: The patient is status post amputation of distal phalanx great toe. There is soft tissue swelling fifth toe. There is cortical irregularity lateral aspect of distal phalanx fifth toe. There is soft tissue defect adjacent to distal phalanx. Findings are highly suspicious for osteomyelitis. No acute fracture or subluxation.   Electronically Signed   By: Lahoma Crocker M.D.   On: 01/10/2014 17:25   Assessment/Plan Principal Problem:   Osteomyelitis Active Problems:   Diabetic foot infection   1. Osteomyelitis The patient is presenting with Right leg fifth toe ulceration, X-ray of her leg is highly suspicious for osteomyelitis of the toe. Patient will be admitted for IV antibiotics, blood culture will be done, ESR and CRP. Orthopedic will be considered by ED. Patient would like to attempt medical treatment with antibiotics and she may  need ID consultation for long-term antibiotics. IV vancomycin and IV Zosyn, IV fluids, I would check CPK.  2.Diabetes Patient mentions she is compliant with her insulin last HbA1c in December was 11 At present I would continue her Lantus and sliding scale  3.Anemia of chronic disease Monitor CBC Iron studies in morning  Consults: orthopedics  DVT Prophylaxis: subcutaneous Heparin Nutrition: diabetic diet  Code Status: full  Disposition: Admitted to inpatient in med-surge unit.  Author: Berle Mull, MD [facility] Pager: [phone] 01/10/2014, 8:30 PM    If 7PM-7AM, please contact night-coverage [email or web address] Password [email or web address]

## 2014-01-10 NOTE — ED Provider Notes (Signed)
CSN: [id number]     Arrival date & time 01/10/14  1627 History   First MD Initiated Contact with Patient 01/10/14 1639     Chief Complaint  Patient presents with  . Toe Pain    right pinky toe  . Leg Swelling     (Consider location/radiation/quality/duration/timing/severity/associated sxs/prior Treatment) Patient is a 53 y.o. female presenting with toe pain. The history is provided by the patient.  Toe Pain   She reports right fifth toe swelling and drainage for one or 2 weeks. She cannot recall exactly how long. Associated with that, she has had right lower leg swelling. She denies right foot, toe or leg pain. She denies fever, chills, nausea, vomiting, weakness, or dizziness. She has not been checking her blood sugar at home. She was hospitalized about 6 weeks ago for her right great toe infection that required a partial amputation. She has not had any trouble walking. She's taking her medications as prescribed. There are no other known modifying factors  Past Medical History  Diagnosis Date  . Diabetes mellitus   . Ulcer of lower limb, unspecified     ulcer on top of foot (right)  . Slow transit constipation   . Disorder of bone and cartilage, unspecified   . Unspecified essential hypertension   . Orthostatic hypotension   . Reflux esophagitis   . Gastroparesis   . Irritable bowel syndrome   . Flatulence, eructation, and gas pain   . Complication of anesthesia   . PONV (postoperative nausea and vomiting)    Past Surgical History  Procedure Laterality Date  . Esophagogastroduodenoscopy  08/11/2012    Procedure: ESOPHAGOGASTRODUODENOSCOPY (EGD);  Surgeon: Shirley Friar, MD;  Location: [facility];  Service: Endoscopy;  Laterality: N/A;  . Tubal ligation    . Toe amputation Left 11/09/2013    partial amputation of great left toe   . Amputation Right 11/07/2013    Procedure: AMPUTATION DIGIT - Right great toe;  Surgeon: Cheral Almas, MD;  [facility];   Service: Orthopedics;  Laterality: Right;  . Amputation Left 11/09/2013    Procedure: AMPUTATION DIGIT - left great toe;  Surgeon: Cheral Almas, MD;  Location: [facility] OR;  Service: Orthopedics;  Laterality: Left;   Family History  Problem Relation Age of Onset  . Diabetes Mother   . Alzheimer's disease Father    History  Substance Use Topics  . Smoking status: Never Smoker   . Smokeless tobacco: Never Used  . Alcohol Use: No   OB History   Grav Para Term Preterm Abortions TAB SAB Ect Mult Living                 Review of Systems  All other systems reviewed and are negative.      Allergies  Review of patient's allergies indicates no known allergies.  Home Medications   Current Outpatient Rx  Name  Route  Sig  Dispense  Refill  . insulin aspart (NOVOLOG) 100 UNIT/ML injection   Subcutaneous   Inject 3-15 Units into the skin 3 (three) times daily before meals. *per sliding scale*         . insulin glargine (LANTUS) 100 UNIT/ML injection   Subcutaneous   Inject 10 Units into the skin at bedtime.          BP 148/77  Pulse 90  Temp(Src) 98.3 F (36.8 C) (Oral)  Resp 16  Ht 5' 6.93" (1.7 m)  Wt 154 lb 6.4 oz (70.035  kg)  BMI 24.23 kg/m2  SpO2 100%  LMP 02/09/2011 Physical Exam  Nursing note and vitals reviewed. Constitutional: She is oriented to person, place, and time. She appears well-developed and well-nourished.  HENT:  Head: Normocephalic and atraumatic.  Eyes: Conjunctivae and EOM are normal. Pupils are equal, round, and reactive to light.  Neck: Normal range of motion and phonation normal. Neck supple.  Cardiovascular: Normal rate, regular rhythm and intact distal pulses.   Pulmonary/Chest: Effort normal and breath sounds normal. She exhibits no tenderness.  Abdominal: Soft. She exhibits no distension. There is no tenderness. There is no guarding.  Musculoskeletal: Normal range of motion.  Neurological: She is alert and oriented to person, place,  and time. She exhibits normal muscle tone.  Right lower leg, swollen, 3+. No right calf tenderness. Moderate right foot swelling with redness, extending to all of the toes. The second toe has a distal ulceration about 1 cm in diameter; it is superficial. The right fifth toe is fluctuant and has 2 areas of drainage with purulent discharge; it is moderately swollen, and likely the source for right foot infection. No apparent lymphangitis, popliteal, tenderness or swelling.  Skin: Skin is warm and dry.  Psychiatric: She has a normal mood and affect. Her behavior is normal. Judgment and thought content normal.    ED Course  Procedures (including critical care time) Medications  0.9 %  sodium chloride infusion ( Intravenous New Bag/Given 01/10/14 1811)  piperacillin-tazobactam (ZOSYN) IVPB 3.375 g (not administered)  sodium chloride 0.9 % bolus 500 mL (0 mLs Intravenous Stopped 01/10/14 1812)    Patient Vitals for the past 24 hrs:  BP Temp Temp src Pulse Resp SpO2 Height Weight  01/10/14 1950 - - - 90 - 100 % 5' 6.93" (1.7 m) -  01/10/14 1949 148/77 mmHg - - - - - - -  01/10/14 1811 165/91 mmHg - - 94 16 98 % - -  01/10/14 1632 139/78 mmHg 98.3 F (36.8 C) Oral 101 18 100 % - 154 lb 6.4 oz (70.035 kg)    7:40 PM-Consult complete with Dr. Allena KatzPatel. Patient case explained and discussed. He agrees to admit patient for further evaluation and treatment. Call ended at 1957  7:57 PM Reevaluation with update and discussion. After initial assessment and treatment, an updated evaluation reveals no further complaints. Lynleigh Kovack L   20:08- is discussed with on-call orthopedics- Dr. Cleophas DunkerWhitfield, or someone in his group will see the patient  Labs Review Labs Reviewed  CBC WITH DIFFERENTIAL - Abnormal; Notable for the following:    RBC 3.06 (*)    Hemoglobin 9.3 (*)    HCT 27.3 (*)    All other components within normal limits  BASIC METABOLIC PANEL - Abnormal; Notable for the following:    Glucose, Bld  139 (*)    GFR calc non Af Amer 81 (*)    All other components within normal limits  CULTURE, BLOOD (ROUTINE X 2)  CULTURE, BLOOD (ROUTINE X 2)  SEDIMENTATION RATE  C-REACTIVE PROTEIN   Imaging Review Dg Foot Complete Right  01/10/2014   CLINICAL DATA:  Diabetes, little toe infection  EXAM: RIGHT FOOT COMPLETE - 3+ VIEW  COMPARISON:  11/04/2013  FINDINGS: Three views of the right foot submitted. The patient is status post amputation of distal phalanx great toe. There is soft tissue swelling fifth toe. There is cortical irregularity lateral aspect of distal phalanx fifth toe. There is soft tissue defect adjacent to distal phalanx. Findings are highly suspicious  for osteomyelitis. No acute fracture or subluxation.  IMPRESSION: The patient is status post amputation of distal phalanx great toe. There is soft tissue swelling fifth toe. There is cortical irregularity lateral aspect of distal phalanx fifth toe. There is soft tissue defect adjacent to distal phalanx. Findings are highly suspicious for osteomyelitis. No acute fracture or subluxation.   Electronically Signed   By: Natasha Mead M.D.   On: 01/10/2014 17:25     EKG Interpretation None      MDM   Final diagnoses:  Diabetic foot infection  Osteomyelitis    Diabetic foot infection with evidence for osteomyelitis. She is at risk for worsening infection if discharged. I suspect polymicrobial infection, she has been started on broad-spectrum antibiotics.   Nursing Notes Reviewed/ Care Coordinated, and agree without changes. Applicable Imaging Reviewed.  Interpretation of Laboratory Data incorporated into ED treatment    Flint Melter, MD 01/10/14 2009

## 2014-01-10 NOTE — ED Notes (Signed)
The pt returned from xray 

## 2014-01-11 DIAGNOSIS — R609 Edema, unspecified: Secondary | ICD-10-CM

## 2014-01-11 DIAGNOSIS — L0291 Cutaneous abscess, unspecified: Secondary | ICD-10-CM

## 2014-01-11 DIAGNOSIS — L089 Local infection of the skin and subcutaneous tissue, unspecified: Secondary | ICD-10-CM

## 2014-01-11 DIAGNOSIS — E1169 Type 2 diabetes mellitus with other specified complication: Secondary | ICD-10-CM

## 2014-01-11 DIAGNOSIS — L0390 Cellulitis, unspecified: Secondary | ICD-10-CM

## 2014-01-11 DIAGNOSIS — L97509 Non-pressure chronic ulcer of other part of unspecified foot with unspecified severity: Secondary | ICD-10-CM

## 2014-01-11 DIAGNOSIS — E119 Type 2 diabetes mellitus without complications: Secondary | ICD-10-CM

## 2014-01-11 LAB — COMPREHENSIVE METABOLIC PANEL
ALK PHOS: 124 U/L — AB (ref 39–117)
ALT: 29 U/L (ref 0–35)
AST: 42 U/L — ABNORMAL HIGH (ref 0–37)
Albumin: 2.3 g/dL — ABNORMAL LOW (ref 3.5–5.2)
BILIRUBIN TOTAL: 0.3 mg/dL (ref 0.3–1.2)
BUN: 16 mg/dL (ref 6–23)
CHLORIDE: 105 meq/L (ref 96–112)
CO2: 25 meq/L (ref 19–32)
CREATININE: 0.87 mg/dL (ref 0.50–1.10)
Calcium: 8.6 mg/dL (ref 8.4–10.5)
GFR, EST AFRICAN AMERICAN: 89 mL/min — AB (ref >90)
GFR, EST NON AFRICAN AMERICAN: 76 mL/min — AB (ref >90)
GLUCOSE: 259 mg/dL — AB (ref 70–99)
POTASSIUM: 4.2 meq/L (ref 3.7–5.3)
Sodium: 141 mEq/L (ref 137–147)
Total Protein: 5.9 g/dL — ABNORMAL LOW (ref 6.0–8.3)

## 2014-01-11 LAB — CBC
HCT: 25.3 % — ABNORMAL LOW (ref 36.0–46.0)
Hemoglobin: 8.7 g/dL — ABNORMAL LOW (ref 12.0–15.0)
MCH: 31.1 pg (ref 26.0–34.0)
MCHC: 34.4 g/dL (ref 30.0–36.0)
MCV: 90.4 fL (ref 78.0–100.0)
PLATELETS: 254 10*3/uL (ref 150–400)
RBC: 2.8 MIL/uL — AB (ref 3.87–5.11)
RDW: 12 % (ref 11.5–15.5)
WBC: 6.6 10*3/uL (ref 4.0–10.5)

## 2014-01-11 LAB — GLUCOSE, CAPILLARY
Glucose-Capillary: 207 mg/dL — ABNORMAL HIGH (ref 70–99)
Glucose-Capillary: 101 mg/dL — ABNORMAL HIGH (ref 70–99)
Glucose-Capillary: 101 mg/dL — ABNORMAL HIGH (ref 70–99)
Glucose-Capillary: 193 mg/dL — ABNORMAL HIGH (ref 70–99)

## 2014-01-11 LAB — PROTIME-INR
INR: 1.11 (ref 0.00–1.49)
PROTHROMBIN TIME: 14.1 s (ref 11.6–15.2)

## 2014-01-11 LAB — HEMOGLOBIN A1C
HEMOGLOBIN A1C: 7.9 % — AB (ref <5.7)
MEAN PLASMA GLUCOSE: 180 mg/dL — AB (ref <117)

## 2014-01-11 NOTE — Progress Notes (Signed)
[facility] PROGRESS NOTE   Jennifer [name] [id number] DOB: July 01, 1961 DOA: 01/10/2014 PCP: Oneal GroutPANDEY, MAHIMA, MD  HPI/Subjective: Denies any fever or chills, denies any other omplaints  Assessment/Plan: Principal Problem:   Osteomyelitis Active Problems:   Diabetic foot infection    Acute osteomyelitis -Right foot ulcer for the past 2-3 weeks, patient has diabetes but denies neuropathy. -Orthopedics consulted, recommended continue antibiotics. -Likely patient will need amputation. Orthopedics please advise if patient needs an MRI.  DM 2 uncontrolled -Check hemoglobin A1c, last A1c from December of 2014 was 11.6 which correlate with mean plasma glucose of 286. -Patient is on insulin, likely need more adjustments.  Anemia -Chronic anemia, normocytic normochromic.   Code Status: Full code Family Communication: Plan discussed with the patient. Disposition Plan: Remains inpatient   Consultants:  Orthopedics  Procedures:  None  Antibiotics:  Vancomycin and Zosyn.   Objective: Filed Vitals:   01/11/14 0548  BP: 116/68  Pulse: 84  Temp: 98.4 F (36.9 C)  Resp: 16    Intake/Output Summary (Last 24 hours) at 01/11/14 1120 Last data filed at 01/11/14 0500  Gross per 24 hour  Intake 511.25 ml  Output      0 ml  Net 511.25 ml   Filed Weights   01/10/14 1632 01/10/14 2056 01/11/14 0349  Weight: 70.035 kg (154 lb 6.4 oz) 69.763 kg (153 lb 12.8 oz) 69.9 kg (154 lb 1.6 oz)    Exam: General: Alert and awake, oriented x3, not in any acute distress. HEENT: anicteric sclera, pupils reactive to light and accommodation, EOMI CVS: S1-S2 clear, no murmur rubs or gallops Chest: clear to auscultation bilaterally, no wheezing, rales or rhonchi Abdomen: soft nontender, nondistended, normal bowel sounds, no organomegaly Extremities: no cyanosis, clubbing or edema noted bilaterally Neuro: Cranial nerves II-XII intact, no focal neurological deficits  Data  Reviewed: Basic Metabolic Panel:  Recent Labs Lab 01/10/14 1730 01/11/14 0545  NA 139 141  K 3.8 4.2  CL 101 105  CO2 24 25  GLUCOSE 139* 259*  BUN 18 16  CREATININE 0.83 0.87  CALCIUM 9.2 8.6   Liver Function Tests:  Recent Labs Lab 01/11/14 0545  AST 42*  ALT 29  ALKPHOS 124*  BILITOT 0.3  PROT 5.9*  ALBUMIN 2.3*   No results found for this basename: LIPASE, AMYLASE,  in the last 168 hours No results found for this basename: AMMONIA,  in the last 168 hours CBC:  Recent Labs Lab 01/10/14 1730 01/11/14 0545  WBC 8.6 6.6  NEUTROABS 5.5  --   HGB 9.3* 8.7*  HCT 27.3* 25.3*  MCV 89.2 90.4  PLT 300 254   Cardiac Enzymes:  Recent Labs Lab 01/10/14 2008  CKTOTAL 250*   BNP (last 3 results) No results found for this basename: PROBNP,  in the last 8760 hours CBG:  Recent Labs Lab 01/10/14 2104 01/11/14 0758  GLUCAP 186* 207*    Micro No results found for this or any previous visit (from the past 240 hour(s)).   Studies: Dg Foot Complete Right  01/10/2014   CLINICAL DATA:  Diabetes, little toe infection  EXAM: RIGHT FOOT COMPLETE - 3+ VIEW  COMPARISON:  11/04/2013  FINDINGS: Three views of the right foot submitted. The patient is status post amputation of distal phalanx great toe. There is soft tissue swelling fifth toe. There is cortical irregularity lateral aspect of distal phalanx fifth toe. There is soft tissue defect adjacent to distal phalanx. Findings are highly suspicious for osteomyelitis. No  acute fracture or subluxation.  IMPRESSION: The patient is status post amputation of distal phalanx great toe. There is soft tissue swelling fifth toe. There is cortical irregularity lateral aspect of distal phalanx fifth toe. There is soft tissue defect adjacent to distal phalanx. Findings are highly suspicious for osteomyelitis. No acute fracture or subluxation.   Electronically Signed   By: Natasha Mead M.D.   On: 01/10/2014 17:25    Scheduled Meds: .  enoxaparin (LOVENOX) injection  40 mg Subcutaneous Q24H  . insulin aspart  0-15 Units Subcutaneous TID WC  . insulin aspart  0-5 Units Subcutaneous QHS  . insulin glargine  10 Units Subcutaneous QHS  . piperacillin-tazobactam (ZOSYN)  IV  3.375 g Intravenous 3 times per day  . vancomycin  1,000 mg Intravenous Q12H   Continuous Infusions: . sodium chloride 75 mL/hr at 01/10/14 2211       Time spent: 35 minutes    [facility] A  [facility] Pager [phone] If 7PM-7AM, please contact night-coverage at [email or web address], password [email or web address] 01/11/2014, 11:20 AM  LOS: 1 day
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
## 2014-01-12 DIAGNOSIS — E118 Type 2 diabetes mellitus with unspecified complications: Secondary | ICD-10-CM

## 2014-01-12 DIAGNOSIS — E1165 Type 2 diabetes mellitus with hyperglycemia: Secondary | ICD-10-CM

## 2014-01-12 DIAGNOSIS — E1140 Type 2 diabetes mellitus with diabetic neuropathy, unspecified: Secondary | ICD-10-CM | POA: Diagnosis present

## 2014-01-12 DIAGNOSIS — L97509 Non-pressure chronic ulcer of other part of unspecified foot with unspecified severity: Secondary | ICD-10-CM

## 2014-01-12 DIAGNOSIS — IMO0002 Reserved for concepts with insufficient information to code with codable children: Secondary | ICD-10-CM

## 2014-01-12 DIAGNOSIS — E11621 Type 2 diabetes mellitus with foot ulcer: Secondary | ICD-10-CM

## 2014-01-12 LAB — CBC
HEMATOCRIT: 25.4 % — AB (ref 36.0–46.0)
Hemoglobin: 8.6 g/dL — ABNORMAL LOW (ref 12.0–15.0)
MCH: 31 pg (ref 26.0–34.0)
MCHC: 33.9 g/dL (ref 30.0–36.0)
MCV: 91.7 fL (ref 78.0–100.0)
Platelets: 264 10*3/uL (ref 150–400)
RBC: 2.77 MIL/uL — AB (ref 3.87–5.11)
RDW: 12.3 % (ref 11.5–15.5)
WBC: 5.9 10*3/uL (ref 4.0–10.5)

## 2014-01-12 LAB — BASIC METABOLIC PANEL
BUN: 13 mg/dL (ref 6–23)
CO2: 26 meq/L (ref 19–32)
CREATININE: 0.9 mg/dL (ref 0.50–1.10)
Calcium: 8.8 mg/dL (ref 8.4–10.5)
Chloride: 102 mEq/L (ref 96–112)
GFR calc Af Amer: 85 mL/min — ABNORMAL LOW (ref >90)
GFR calc non Af Amer: 73 mL/min — ABNORMAL LOW (ref >90)
GLUCOSE: 173 mg/dL — AB (ref 70–99)
Potassium: 4.1 mEq/L (ref 3.7–5.3)
Sodium: 138 mEq/L (ref 137–147)

## 2014-01-12 LAB — GLUCOSE, CAPILLARY
GLUCOSE-CAPILLARY: 158 mg/dL — AB (ref 70–99)
GLUCOSE-CAPILLARY: 204 mg/dL — AB (ref 70–99)
Glucose-Capillary: 217 mg/dL — ABNORMAL HIGH (ref 70–99)
Glucose-Capillary: 199 mg/dL — ABNORMAL HIGH (ref 70–99)

## 2014-01-12 MED ORDER — CHLORHEXIDINE GLUCONATE 4 % EX LIQD
60.0000 mL | Freq: Every day | CUTANEOUS | Status: DC
  Administered 2014-01-12 – 2014-01-13 (×2): 4 via TOPICAL
  Filled 2014-01-12: qty 60

## 2014-01-12 MED ORDER — CHLORHEXIDINE GLUCONATE 4 % EX LIQD
60.0000 mL | Freq: Once | CUTANEOUS | Status: DC
  Filled 2014-01-12 (×2): qty 60

## 2014-01-12 MED ORDER — SODIUM CHLORIDE 0.9 % IV SOLN: INTRAVENOUS | Status: DC

## 2014-01-12 NOTE — Progress Notes (Signed)
Subjective:   Procedure(s) (LRB): AMPUTATION RIGHT FIFTH TOE (Right) Patient reports pain as mild.    Objective: Vital signs in last 24 hours: Temp:  [98.2 F (36.8 C)-98.6 F (37 C)] 98.4 F (36.9 C) (03/02 0456) Pulse Rate:  [78-85] 78 (03/02 0456) Resp:  [17-18] 17 (03/02 0456) BP: (112-128)/(68-75) 128/75 mmHg (03/02 0456) SpO2:  [96 %-97 %] 97 % (03/02 0456) Weight:  [70.1 kg (154 lb 8.7 oz)] 70.1 kg (154 lb 8.7 oz) (03/02 0306)  Intake/Output from previous day:   Intake/Output this shift:     Recent Labs  01/10/14 1730 01/11/14 0545 01/12/14 0547  HGB 9.3* 8.7* 8.6*    Recent Labs  01/11/14 0545 01/12/14 0547  WBC 6.6 5.9  RBC 2.80* 2.77*  HCT 25.3* 25.4*  PLT 254 264    Recent Labs  01/11/14 0545 01/12/14 0547  NA 141 138  K 4.2 4.1  CL 105 102  CO2 25 26  BUN 16 13  CREATININE 0.87 0.90  GLUCOSE 259* 173*  CALCIUM 8.6 8.8    Recent Labs  01/11/14 0545  INR 1.11  right foot with small amount of pus from tip of second toe-no erythema Less swelling from fifth toe but still has purulent drainage-maybe less edema of toe and right leg Cultures not complete-but with gram pos cocci in pairs on stain    Procedure(s) (LRB): AMPUTATION RIGHT FIFTH TOE (Right) Discussed amp of right fifth toe and tip of second toe-most likely thru DIP or PIP jt-she would like to proceed-will see if we can schedule this tomorrow  Norlene CampbellWHITFIELD, PETER W 01/12/2014, 12:51 PM

## 2014-01-12 NOTE — Progress Notes (Signed)
[facility] PROGRESS NOTE Interim History: 53 y.o. female with Past medical history of diabetes melitus, right great toe osteomyelitis. The patient is coming from home.  The patient presented with complaints of right fifth toe discharge and ulceration. 2 weeks ago she noted that there was a small blister on her fifth toe which gradually progressed into a large blister and then started draining to 3 days ago. She has been walking around and wears steel toe shoes and denies any injury to her legs. She also denies any neuropathy. She has noted that there is swelling of her right leg but does not have any tenderness in her leg or ankle or knee joint. Denies any fever chills constitutional symptoms.   Assessment/Plan: Diabetic foot infection/Osteomyelitis - Right foot ulcer for the past 2-3 weeks, patient has diabetes but denies neuropathy.  - Orthopedics consulted, recommended continue antibiotics. - left foot x-ray: distal phalanx great toe.  There is soft tissue swelling fifth toe. There is cortical irregularity lateral aspect of distal phalanx fifth toe  DM2 (diabetes mellitus, type 2) - Hemoglobin A1c, last A1c from December of 2014 was 11 - Improved controlled.  AOCD:  - Hbg stable.  Code Status: full  Disposition: Admitted to inpatient in med-surge unit.   Consultants:  Ortho  Procedures:  none  Antibiotics:  vanc and zosyn  HPI/Subjective: No conmplains  Objective: Filed Vitals:   01/11/14 1451 01/11/14 2114 01/12/14 0306 01/12/14 0456  BP: 112/68 121/72  128/75  Pulse: 85 81  78  Temp: 98.6 F (37 C) 98.2 F (36.8 C)  98.4 F (36.9 C)  TempSrc: Oral Oral  Oral  Resp: 18 18  17   Height:      Weight:   70.1 kg (154 lb 8.7 oz)   SpO2: 97% 96%  97%   No intake or output data in the 24 hours ending 01/12/14 1009 Filed Weights   01/10/14 2056 01/11/14 0349 01/12/14 0306  Weight: 69.763 kg (153 lb 12.8 oz) 69.9 kg (154 lb 1.6 oz) 70.1 kg (154 lb 8.7  oz)    Exam:  General: Alert, awake, oriented x3, in no acute distress.  HEENT: No bruits, no goiter.  Heart: Regular rate and rhythm, without murmurs, rubs, gallops.  Lungs: Good air movement, clear. Abdomen: Soft, nontender, nondistended, positive bowel sounds.    Data Reviewed: Basic Metabolic Panel:  Recent Labs Lab 01/10/14 1730 01/11/14 0545 01/12/14 0547  NA 139 141 138  K 3.8 4.2 4.1  CL 101 105 102  CO2 24 25 26   GLUCOSE 139* 259* 173*  BUN 18 16 13   CREATININE 0.83 0.87 0.90  CALCIUM 9.2 8.6 8.8   Liver Function Tests:  Recent Labs Lab 01/11/14 0545  AST 42*  ALT 29  ALKPHOS 124*  BILITOT 0.3  PROT 5.9*  ALBUMIN 2.3*   No results found for this basename: LIPASE, AMYLASE,  in the last 168 hours No results found for this basename: AMMONIA,  in the last 168 hours CBC:  Recent Labs Lab 01/10/14 1730 01/11/14 0545 01/12/14 0547  WBC 8.6 6.6 5.9  NEUTROABS 5.5  --   --   HGB 9.3* 8.7* 8.6*  HCT 27.3* 25.3* 25.4*  MCV 89.2 90.4 91.7  PLT 300 254 264   Cardiac Enzymes:  Recent Labs Lab 01/10/14 2008  CKTOTAL 250*   BNP (last 3 results) No results found for this basename: PROBNP,  in the last 8760 hours CBG:  Recent Labs Lab 01/11/14 0758 01/11/14  1142 01/11/14 1725 01/11/14 2108 01/12/14 0747  GLUCAP 207* 101* 101* 193* 158*    Recent Results (from the past 240 hour(s))  CULTURE, BLOOD (ROUTINE X 2)     Status: None   Collection Time    01/10/14  8:08 PM      Result Value Ref Range Status   Specimen Description BLOOD LEFT ARM   Final   Special Requests BOTTLES DRAWN AEROBIC AND ANAEROBIC B 10CC R 7CC   Final   Culture  Setup Time     Final   Value: 01/11/2014 02:58     Performed at [facility]   Culture     Final   Value:        BLOOD CULTURE RECEIVED NO GROWTH TO DATE CULTURE WILL BE HELD FOR 5 DAYS BEFORE ISSUING A FINAL NEGATIVE REPORT     Performed at [facility]   Report Status PENDING   Incomplete    CULTURE, BLOOD (ROUTINE X 2)     Status: None   Collection Time    01/10/14  8:17 PM      Result Value Ref Range Status   Specimen Description BLOOD LEFT HAND   Final   Special Requests BOTTLES DRAWN AEROBIC ONLY [facility]7CC   Final   Culture  Setup Time     Final   Value: 01/11/2014 02:58     Performed at [facility]   Culture     Final   Value:        BLOOD CULTURE RECEIVED NO GROWTH TO DATE CULTURE WILL BE HELD FOR 5 DAYS BEFORE ISSUING A FINAL NEGATIVE REPORT     Performed at [facility]   Report Status PENDING   Incomplete  WOUND CULTURE     Status: None   Collection Time    01/11/14 11:26 AM      Result Value Ref Range Status   Specimen Description WOUND RIGHT TOE   Final   Special Requests Normal   Final   Gram Stain PENDING   Incomplete   Culture     Final   Value: Culture reincubated for better growth     Performed at [facility]   Report Status PENDING   Incomplete     Studies: Dg Foot Complete Right  01/10/2014   CLINICAL DATA:  Diabetes, little toe infection  EXAM: RIGHT FOOT COMPLETE - 3+ VIEW  COMPARISON:  11/04/2013  FINDINGS: Three views of the right foot submitted. The patient is status post amputation of distal phalanx great toe. There is soft tissue swelling fifth toe. There is cortical irregularity lateral aspect of distal phalanx fifth toe. There is soft tissue defect adjacent to distal phalanx. Findings are highly suspicious for osteomyelitis. No acute fracture or subluxation.  IMPRESSION: The patient is status post amputation of distal phalanx great toe. There is soft tissue swelling fifth toe. There is cortical irregularity lateral aspect of distal phalanx fifth toe. There is soft tissue defect adjacent to distal phalanx. Findings are highly suspicious for osteomyelitis. No acute fracture or subluxation.   Electronically Signed   By: Natasha MeadLiviu  Pop M.D.   On: 01/10/2014 17:25    Scheduled Meds: . enoxaparin (LOVENOX) injection  40 mg  Subcutaneous Q24H  . insulin aspart  0-15 Units Subcutaneous TID WC  . insulin aspart  0-5 Units Subcutaneous QHS  . insulin glargine  10 Units Subcutaneous QHS  . piperacillin-tazobactam (ZOSYN)  IV  3.375 g Intravenous 3 times  per day  . vancomycin  1,000 mg Intravenous Q12H   Continuous Infusions:    Marinda Elk  [facility] Pager [phone]. If 8PM-8AM, please contact night-coverage at [email or web address], password [facility] 01/12/2014, 10:09 AM  LOS: 2 days

## 2014-01-13 ENCOUNTER — Encounter (HOSPITAL_COMMUNITY): Payer: BC Managed Care – PPO | Admitting: Anesthesiology

## 2014-01-13 ENCOUNTER — Inpatient Hospital Stay (HOSPITAL_COMMUNITY): Payer: BC Managed Care – PPO | Admitting: Anesthesiology

## 2014-01-13 ENCOUNTER — Encounter (HOSPITAL_COMMUNITY): Payer: Self-pay | Admitting: Anesthesiology

## 2014-01-13 ENCOUNTER — Encounter (HOSPITAL_COMMUNITY)
Admission: EM | Disposition: A | Discharge status: Home Health | Payer: Self-pay | Source: Home / Self Care | Attending: Internal Medicine

## 2014-01-13 DIAGNOSIS — M9080 Osteopathy in diseases classified elsewhere, unspecified site: Secondary | ICD-10-CM

## 2014-01-13 DIAGNOSIS — M869 Osteomyelitis, unspecified: Secondary | ICD-10-CM

## 2014-01-13 DIAGNOSIS — I10 Essential (primary) hypertension: Secondary | ICD-10-CM

## 2014-01-13 HISTORY — PX: AMPUTATION: SHX166

## 2014-01-13 LAB — GLUCOSE, CAPILLARY
Glucose-Capillary: 179 mg/dL — ABNORMAL HIGH (ref 70–99)
Glucose-Capillary: 174 mg/dL — ABNORMAL HIGH (ref 70–99)
Glucose-Capillary: 116 mg/dL — ABNORMAL HIGH (ref 70–99)
Glucose-Capillary: 212 mg/dL — ABNORMAL HIGH (ref 70–99)
Glucose-Capillary: 248 mg/dL — ABNORMAL HIGH (ref 70–99)

## 2014-01-13 LAB — VANCOMYCIN, TROUGH: Vancomycin Tr: 12.9 ug/mL (ref 10.0–20.0)

## 2014-01-13 SURGERY — AMPUTATION DIGIT
Anesthesia: General | Laterality: Right

## 2014-01-13 MED ORDER — HYDROCODONE-ACETAMINOPHEN 5-325 MG PO TABS
1.0000 | ORAL_TABLET | ORAL | Status: DC | PRN
  Administered 2014-01-13: 2 via ORAL
  Filled 2014-01-13: qty 2

## 2014-01-13 MED ORDER — FENTANYL CITRATE 0.05 MG/ML IJ SOLN
INTRAMUSCULAR | Status: AC
  Filled 2014-01-13: qty 5

## 2014-01-13 MED ORDER — SODIUM CHLORIDE 0.9 % IR SOLN
Status: DC | PRN
  Administered 2014-01-13: 1000 mL

## 2014-01-13 MED ORDER — MIDAZOLAM HCL 2 MG/2ML IJ SOLN
INTRAMUSCULAR | Status: AC
  Filled 2014-01-13: qty 2

## 2014-01-13 MED ORDER — FENTANYL CITRATE 0.05 MG/ML IJ SOLN
INTRAMUSCULAR | Status: DC | PRN
  Administered 2014-01-13: 50 ug via INTRAVENOUS

## 2014-01-13 MED ORDER — PROPOFOL 10 MG/ML IV BOLUS
INTRAVENOUS | Status: DC | PRN
  Administered 2014-01-13: 180 mg via INTRAVENOUS

## 2014-01-13 MED ORDER — ONDANSETRON HCL 4 MG/2ML IJ SOLN: 4.0000 mg | Freq: Four times a day (QID) | INTRAMUSCULAR | Status: DC | PRN

## 2014-01-13 MED ORDER — HYDROMORPHONE HCL PF 1 MG/ML IJ SOLN: 0.2500 mg | INTRAMUSCULAR | Status: DC | PRN

## 2014-01-13 MED ORDER — SODIUM CHLORIDE 0.9 % IV SOLN
INTRAVENOUS | Status: DC
  Administered 2014-01-13: 100 mL/h via INTRAVENOUS
  Administered 2014-01-14: 02:00:00 via INTRAVENOUS

## 2014-01-13 MED ORDER — MIDAZOLAM HCL 5 MG/5ML IJ SOLN
INTRAMUSCULAR | Status: DC | PRN
  Administered 2014-01-13 (×2): 1 mg via INTRAVENOUS

## 2014-01-13 MED ORDER — OXYCODONE HCL 5 MG PO TABS: 5.0000 mg | ORAL_TABLET | Freq: Once | ORAL | Status: DC | PRN

## 2014-01-13 MED ORDER — OXYCODONE-ACETAMINOPHEN 5-325 MG PO TABS: 1.0000 | ORAL_TABLET | ORAL | Status: DC | PRN

## 2014-01-13 MED ORDER — EPHEDRINE SULFATE 50 MG/ML IJ SOLN
INTRAMUSCULAR | Status: AC
  Filled 2014-01-13: qty 1

## 2014-01-13 MED ORDER — ONDANSETRON HCL 4 MG/2ML IJ SOLN
INTRAMUSCULAR | Status: DC | PRN
  Administered 2014-01-13: 4 mg via INTRAVENOUS

## 2014-01-13 MED ORDER — METOCLOPRAMIDE HCL 5 MG/ML IJ SOLN
5.0000 mg | Freq: Three times a day (TID) | INTRAMUSCULAR | Status: DC | PRN
  Filled 2014-01-13: qty 2

## 2014-01-13 MED ORDER — LACTATED RINGERS IV SOLN
INTRAVENOUS | Status: DC | PRN
  Administered 2014-01-13: 10:00:00 via INTRAVENOUS

## 2014-01-13 MED ORDER — ONDANSETRON HCL 4 MG PO TABS: 4.0000 mg | ORAL_TABLET | Freq: Four times a day (QID) | ORAL | Status: DC | PRN

## 2014-01-13 MED ORDER — LIDOCAINE HCL (CARDIAC) 20 MG/ML IV SOLN
INTRAVENOUS | Status: DC | PRN
  Administered 2014-01-13: 100 mg via INTRAVENOUS

## 2014-01-13 MED ORDER — EPHEDRINE SULFATE 50 MG/ML IJ SOLN
INTRAMUSCULAR | Status: DC | PRN
  Administered 2014-01-13 (×3): 5 mg via INTRAVENOUS

## 2014-01-13 MED ORDER — METOCLOPRAMIDE HCL 5 MG/ML IJ SOLN
INTRAMUSCULAR | Status: AC
  Filled 2014-01-13: qty 2

## 2014-01-13 MED ORDER — OXYCODONE HCL 5 MG/5ML PO SOLN: 5.0000 mg | Freq: Once | ORAL | Status: DC | PRN

## 2014-01-13 MED ORDER — METOCLOPRAMIDE HCL 5 MG/ML IJ SOLN: 10.0000 mg | Freq: Once | INTRAMUSCULAR | Status: DC | PRN

## 2014-01-13 MED ORDER — ARTIFICIAL TEARS OP OINT
TOPICAL_OINTMENT | OPHTHALMIC | Status: DC | PRN
  Administered 2014-01-13: 1 via OPHTHALMIC

## 2014-01-13 MED ORDER — VANCOMYCIN HCL 10 G IV SOLR
1250.0000 mg | Freq: Two times a day (BID) | INTRAVENOUS | Status: DC
  Administered 2014-01-13 – 2014-01-15 (×5): 1250 mg via INTRAVENOUS
  Filled 2014-01-13 (×6): qty 1250

## 2014-01-13 MED ORDER — STERILE WATER FOR INJECTION IJ SOLN
INTRAMUSCULAR | Status: AC
  Filled 2014-01-13: qty 10

## 2014-01-13 MED ORDER — METOCLOPRAMIDE HCL 5 MG/ML IJ SOLN
INTRAMUSCULAR | Status: DC | PRN
  Administered 2014-01-13: 10 mg via INTRAVENOUS

## 2014-01-13 MED ORDER — METOCLOPRAMIDE HCL 5 MG PO TABS
5.0000 mg | ORAL_TABLET | Freq: Three times a day (TID) | ORAL | Status: DC | PRN
  Filled 2014-01-13: qty 2

## 2014-01-13 SURGICAL SUPPLY — 37 items
BANDAGE ELASTIC 4 VELCRO ST LF (GAUZE/BANDAGES/DRESSINGS) ×2 IMPLANT
BANDAGE ELASTIC 6 VELCRO ST LF (GAUZE/BANDAGES/DRESSINGS) ×2 IMPLANT
BANDAGE ESMARK 6X9 LF (GAUZE/BANDAGES/DRESSINGS) IMPLANT
BLADE SURG 10 STRL SS (BLADE) ×3 IMPLANT
BNDG CMPR 9X6 STRL LF SNTH (GAUZE/BANDAGES/DRESSINGS) ×1
BNDG COHESIVE 4X5 TAN STRL (GAUZE/BANDAGES/DRESSINGS) ×1 IMPLANT
BNDG ESMARK 6X9 LF (GAUZE/BANDAGES/DRESSINGS) ×3
BNDG GAUZE ELAST 4 BULKY (GAUZE/BANDAGES/DRESSINGS) ×2 IMPLANT
DRSG ADAPTIC 3X8 NADH LF (GAUZE/BANDAGES/DRESSINGS) ×3 IMPLANT
DURAPREP 26ML APPLICATOR (WOUND CARE) ×3 IMPLANT
ELECT REM PT RETURN 9FT ADLT (ELECTROSURGICAL) ×3
ELECTRODE REM PT RTRN 9FT ADLT (ELECTROSURGICAL) ×1 IMPLANT
GAUZE XEROFORM 1X8 LF (GAUZE/BANDAGES/DRESSINGS) ×2 IMPLANT
GLOVE BIOGEL PI IND STRL 8 (GLOVE) ×1 IMPLANT
GLOVE BIOGEL PI INDICATOR 8 (GLOVE) ×2
GLOVE ECLIPSE 8.0 STRL XLNG CF (GLOVE) ×3 IMPLANT
GOWN PREVENTION PLUS XLARGE (GOWN DISPOSABLE) ×3 IMPLANT
GOWN STRL NON-REIN LRG LVL3 (GOWN DISPOSABLE) ×3 IMPLANT
KIT BASIN OR (CUSTOM PROCEDURE TRAY) ×3 IMPLANT
KIT ROOM TURNOVER OR (KITS) ×3 IMPLANT
MEDIUM NARROW SAW BLAD ×2 IMPLANT
NS IRRIG 1000ML POUR BTL (IV SOLUTION) ×3 IMPLANT
PACK ORTHO EXTREMITY (CUSTOM PROCEDURE TRAY) ×3 IMPLANT
PAD ABD 8X10 STRL (GAUZE/BANDAGES/DRESSINGS) ×2 IMPLANT
PAD ARMBOARD 7.5X6 YLW CONV (MISCELLANEOUS) ×6 IMPLANT
PAD CAST 4YDX4 CTTN HI CHSV (CAST SUPPLIES) ×1 IMPLANT
PADDING CAST COTTON 4X4 STRL (CAST SUPPLIES)
SPONGE GAUZE 4X4 12PLY (GAUZE/BANDAGES/DRESSINGS) ×3 IMPLANT
SPONGE LAP 18X18 X RAY DECT (DISPOSABLE) ×6 IMPLANT
STOCKINETTE IMPERVIOUS LG (DRAPES) IMPLANT
SUCTION FRAZIER TIP 10 FR DISP (SUCTIONS) ×3 IMPLANT
SUT ETHILON 4 0 PS 2 18 (SUTURE) ×3 IMPLANT
TOWEL OR 17X24 6PK STRL BLUE (TOWEL DISPOSABLE) ×3 IMPLANT
TOWEL OR 17X26 10 PK STRL BLUE (TOWEL DISPOSABLE) ×3 IMPLANT
TUBE CONNECTING 12'X1/4 (SUCTIONS) ×1
TUBE CONNECTING 12X1/4 (SUCTIONS) ×2 IMPLANT
WATER STERILE IRR 1000ML POUR (IV SOLUTION) ×1 IMPLANT

## 2014-01-13 NOTE — Consult Note (Signed)
ANTIBIOTIC CONSULT NOTE - INITIAL  Pharmacy Consult for Vancomycin and Zosyn Indication: osteomyelitis  No Known Allergies  Patient Measurements: Height: 5' 8.4" (173.7 cm) Weight: 147 lb (66.679 kg) IBW/kg (Calculated) : 64.82  Vital Signs: Temp: 97.4 F (36.3 C) (03/03 1259) Temp src: Oral (03/03 1259) BP: 158/89 mmHg (03/03 1259) Pulse Rate: 88 (03/03 1259)   Recent Labs  01/10/14 1730 01/11/14 0545 01/12/14 0547  WBC 8.6 6.6 5.9  HGB 9.3* 8.7* 8.6*  PLT 300 254 264  CREATININE 0.83 0.87 0.90   Estimated Creatinine Clearance: 76.5 ml/min (by C-G formula based on Cr of 0.9). Medical History: Past Medical History  Diagnosis Date  . Diabetes mellitus   . Ulcer of lower limb, unspecified     ulcer on top of foot (right)  . Slow transit constipation   . Disorder of bone and cartilage, unspecified   . Unspecified essential hypertension   . Orthostatic hypotension   . Reflux esophagitis   . Gastroparesis   . Irritable bowel syndrome   . Flatulence, eructation, and gas pain   . Complication of anesthesia   . PONV (postoperative nausea and vomiting)    Assessment: 53 yo female with hx uncontrolled diabetes s/p amputation of right distal phalanx great toe presented to the ED with right foot swelling.  Pt was admitted 2 months ago with osteomyelitis and was treated with vancomycin 750mg  q8 which produced a supratherapeutic trough. Vanc trough this morning was slightly low at 12.9.  Pt is s/p R 2nd and 5th toe amputation POD #0.  Today is day#4 of vanc + zosyn.  Goal of Therapy:  Vancomycin trough level 15-20 mcg/ml  Plan:  - Increase to Vancomycin 1250 mg IV q12h - Continue zosyn 3.375 IV q8h - Follow renal function, cultures, LOT  Agapito GamesAlison Lionel Woodberry, PharmD, BCPS Clinical Pharmacist Pager: [phone] 01/13/2014 2:02 PM

## 2014-01-13 NOTE — Preoperative (Signed)
Beta Blockers   Reason not to administer Beta Blockers:Not Applicable 

## 2014-01-13 NOTE — Progress Notes (Signed)
Orthopedic Tech Progress Note Patient Details:  Jennifer [name] 09-09-1961 [id number]  Ortho Devices Type of Ortho Device: CAM walker Ortho Device/Splint Interventions: Application   Cammer, Mickie BailJennifer Carol 01/13/2014, 2:11 PM

## 2014-01-13 NOTE — Anesthesia Procedure Notes (Signed)
Procedure Name: LMA Insertion Date/Time: 01/13/2014 10:33 AM Performed by: Gayla MedicusHYPES, Jennifer Kamel M. Pre-anesthesia Checklist: Patient identified, Patient being monitored, Emergency Drugs available, Timeout performed and Suction available Patient Re-evaluated:Patient Re-evaluated prior to inductionOxygen Delivery Method: Circle system utilized Preoxygenation: Pre-oxygenation with 100% oxygen Intubation Type: IV induction LMA: LMA inserted LMA Size: 4.0 Number of attempts: 1 Placement Confirmation: positive ETCO2 and breath sounds checked- equal and bilateral Tube secured with: Tape Dental Injury: Teeth and Oropharynx as per pre-operative assessment

## 2014-01-13 NOTE — Op Note (Signed)
PATIENT ID:      Oren Beckmannammy C Kundinger  MRN:     [id number] DOB/AGE:    1961/04/10 / 53 y.o.       OPERATIVE REPORT    DATE OF PROCEDURE:  01/13/2014       PREOPERATIVE DIAGNOSIS:  DIABETIC PVD RIGHT FOOT WITH OSTEOMYELITIS,ABSCESS LITTLE TOE,ABSCESS TIP OF SECOND TOE                                                       Estimated body mass index is 22.1 kg/(m^2) as calculated from the following:   Height as of this encounter: 5' 8.4" (1.737 m).   Weight as of this encounter: 66.679 kg (147 lb).     POSTOPERATIVE DIAGNOSIS:   SAME                                                                    Estimated body mass index is 22.1 kg/(m^2) as calculated from the following:   Height as of this encounter: 5' 8.4" (1.737 m).   Weight as of this encounter: 66.679 kg (147 lb).     PROCEDURE:  Procedure(s)RAY AMPUTATION RIGHT LITTLE TOE,TERMINAL SYMES AMPUTATION RIGHT SECOND TOE     SURGEON:  Norlene CampbellPeter Marisal Swarey, MD    ASSISTANT:   Jacqualine CodeBrian Petrarca, PA-C   (Present and scrubbed throughout the case, critical for assistance with exposure, retraction, instrumentation, and closure.)          ANESTHESIA: general     DRAINS: none :      TOURNIQUET TIME: * No tourniquets in log *    COMPLICATIONS:  None   CONDITION:  stable  PROCEDURE IN DETAIL: [id number]   Greogry Goodwyn W 01/13/2014, 11:32 AM

## 2014-01-13 NOTE — Progress Notes (Signed)
[facility] PROGRESS NOTE Interim History: 53 y.o. female with Past medical history of diabetes melitus, right great toe osteomyelitis. The patient is coming from home.  The patient presented with complaints of right fifth toe discharge and ulceration. 2 weeks ago she noted that there was a small blister on her fifth toe which gradually progressed into a large blister and then started draining to 3 days ago. She has been walking around and wears steel toe shoes and denies any injury to her legs. She also denies any neuropathy. She has noted that there is swelling of her right leg but does not have any tenderness in her leg or ankle or knee joint. Denies any fever chills constitutional symptoms.   Assessment/Plan: Diabetic foot infection/Osteomyelitis - Right foot ulcer for the past 2-3 weeks, patient has diabetes but denies neuropathy.  - Left foot x-ray: distal phalanx great toe. There is soft tissue swelling fifth toe. There is cortical irregularity lateral aspect of distal phalanx fifth toe. For surgery on 3.3.2015.  DM2 (diabetes mellitus, type 2) - Hemoglobin A1c, last A1c from December of 2014 was 11 - Improved controlled.  AOCD:  - Hbg stable.  Code Status: full  Disposition: Admitted to inpatient in med-surge unit.   Consultants:  Ortho  Procedures:  Amputation 3.3.2015  Antibiotics:  vanc and zosyn  HPI/Subjective: No conmplains  Objective: Filed Vitals:   01/12/14 2047 01/13/14 0500 01/13/14 0517 01/13/14 0631  BP: 112/74  160/88 144/87  Pulse: 78  76 82  Temp: 98.2 F (36.8 C)  97.9 F (36.6 C)   TempSrc: Oral  Oral   Resp: 16  16   Height:      Weight:  66.679 kg (147 lb)    SpO2: 97%  98%    No intake or output data in the 24 hours ending 01/13/14 0943 Filed Weights   01/11/14 0349 01/12/14 0306 01/13/14 0500  Weight: 69.9 kg (154 lb 1.6 oz) 70.1 kg (154 lb 8.7 oz) 66.679 kg (147 lb)    Exam:  General: Alert, awake, oriented x3, in  no acute distress.  HEENT: No bruits, no goiter.  Heart: Regular rate and rhythm, without murmurs, rubs, gallops.  Lungs: Good air movement, clear. Abdomen: Soft, nontender, nondistended, positive bowel sounds.    Data Reviewed: Basic Metabolic Panel:  Recent Labs Lab 01/10/14 1730 01/11/14 0545 01/12/14 0547  NA 139 141 138  K 3.8 4.2 4.1  CL 101 105 102  CO2 24 25 26   GLUCOSE 139* 259* 173*  BUN 18 16 13   CREATININE 0.83 0.87 0.90  CALCIUM 9.2 8.6 8.8   Liver Function Tests:  Recent Labs Lab 01/11/14 0545  AST 42*  ALT 29  ALKPHOS 124*  BILITOT 0.3  PROT 5.9*  ALBUMIN 2.3*   No results found for this basename: LIPASE, AMYLASE,  in the last 168 hours No results found for this basename: AMMONIA,  in the last 168 hours CBC:  Recent Labs Lab 01/10/14 1730 01/11/14 0545 01/12/14 0547  WBC 8.6 6.6 5.9  NEUTROABS 5.5  --   --   HGB 9.3* 8.7* 8.6*  HCT 27.3* 25.3* 25.4*  MCV 89.2 90.4 91.7  PLT 300 254 264   Cardiac Enzymes:  Recent Labs Lab 01/10/14 2008  CKTOTAL 250*   BNP (last 3 results) No results found for this basename: PROBNP,  in the last 8760 hours CBG:  Recent Labs Lab 01/12/14 1150 01/12/14 1752 01/12/14 2150 01/13/14 0508 01/13/14 0751  GLUCAP  217* 199* 204* 179* 174*    Recent Results (from the past 240 hour(s))  CULTURE, BLOOD (ROUTINE X 2)     Status: None   Collection Time    01/10/14  8:08 PM      Result Value Ref Range Status   Specimen Description BLOOD LEFT ARM   Final   Special Requests BOTTLES DRAWN AEROBIC AND ANAEROBIC B 10CC R 7CC   Final   Culture  Setup Time     Final   Value: 01/11/2014 02:58     Performed at [facility]   Culture     Final   Value:        BLOOD CULTURE RECEIVED NO GROWTH TO DATE CULTURE WILL BE HELD FOR 5 DAYS BEFORE ISSUING A FINAL NEGATIVE REPORT     Performed at [facility]   Report Status PENDING   Incomplete  CULTURE, BLOOD (ROUTINE X 2)     Status: None   Collection  Time    01/10/14  8:17 PM      Result Value Ref Range Status   Specimen Description BLOOD LEFT HAND   Final   Special Requests BOTTLES DRAWN AEROBIC ONLY [facility]   Final   Culture  Setup Time     Final   Value: 01/11/2014 02:58     Performed at [facility]   Culture     Final   Value:        BLOOD CULTURE RECEIVED NO GROWTH TO DATE CULTURE WILL BE HELD FOR 5 DAYS BEFORE ISSUING A FINAL NEGATIVE REPORT     Performed at [facility]   Report Status PENDING   Incomplete  WOUND CULTURE     Status: None   Collection Time    01/11/14 11:26 AM      Result Value Ref Range Status   Specimen Description WOUND RIGHT TOE   Final   Special Requests Normal   Final   Gram Stain     Final   Value: RARE WBC PRESENT,BOTH PMN AND MONONUCLEAR     NO SQUAMOUS EPITHELIAL CELLS SEEN     RARE GRAM POSITIVE COCCI IN PAIRS     Performed at [facility]   Culture     Final   Value: Culture reincubated for better growth     Performed at [facility]   Report Status PENDING   Incomplete     Studies: No results found.  Scheduled Meds: . chlorhexidine  60 mL Topical Q2000  . chlorhexidine  60 mL Topical Once  . enoxaparin (LOVENOX) injection  40 mg Subcutaneous Q24H  . insulin aspart  0-15 Units Subcutaneous TID WC  . insulin aspart  0-5 Units Subcutaneous QHS  . insulin glargine  10 Units Subcutaneous QHS  . piperacillin-tazobactam (ZOSYN)  IV  3.375 g Intravenous 3 times per day  . vancomycin  1,000 mg Intravenous Q12H   Continuous Infusions: . sodium chloride 75 mL/hr at 01/12/14 2200     Marinda Elk  [facility] Pager [phone]. If 8PM-8AM, please contact night-coverage at [email or web address], password [facility] 01/13/2014, 9:43 AM  LOS: 3 days

## 2014-01-13 NOTE — Transfer of Care (Signed)
Immediate Anesthesia Transfer of Care Note  Patient: Jennifer [name]  Procedure(s) Performed: Procedure(s): AMPUTATION RIGHT FIFTH TOE AND RIGHT SECOND TOE (Right)  Patient Location: PACU  Anesthesia Type:General  Level of Consciousness: awake, alert  and oriented  Airway & Oxygen Therapy: Patient Spontanous Breathing and Patient connected to nasal cannula oxygen  Post-op Assessment: Report given to PACU RN and Post -op Vital signs reviewed and stable  Post vital signs: Reviewed and stable  Complications: No apparent anesthesia complications

## 2014-01-13 NOTE — Anesthesia Postprocedure Evaluation (Signed)
Anesthesia Post Note  Patient: Jennifer [name]  Procedure(s) Performed: Procedure(s) (LRB): AMPUTATION RIGHT FIFTH TOE AND RIGHT SECOND TOE (Right)  Anesthesia type: General  Patient location: PACU  Post pain: Pain level controlled  Post assessment: Patient's Cardiovascular Status Stable  Last Vitals:  Filed Vitals:   01/13/14 1201  BP: 145/90  Pulse: 88  Temp: 36.6 C  Resp: 15    Post vital signs: Reviewed and stable  Level of consciousness: alert  Complications: No apparent anesthesia complications

## 2014-01-13 NOTE — Anesthesia Preprocedure Evaluation (Addendum)
Anesthesia Evaluation  Patient identified by MRN, date of birth, ID band Patient awake    Reviewed: Allergy & Precautions, H&P , NPO status , Patient's Chart, lab work & pertinent test results, reviewed documented beta blocker date and time   History of Anesthesia Complications (+) PONV and history of anesthetic complications  Airway Mallampati: II TM Distance: >3 FB Neck ROM: full    Dental  (+) Edentulous Upper, Dental Advisory Given   Pulmonary neg pulmonary ROS,  breath sounds clear to auscultation        Cardiovascular hypertension, On Medications and On Home Beta Blockers + Peripheral Vascular Disease Rhythm:regular     Neuro/Psych negative neurological ROS  negative psych ROS   GI/Hepatic negative GI ROS, Neg liver ROS, (+) Hepatitis -  Endo/Other  diabetes, Insulin Dependent  Renal/GU negative Renal ROS  negative genitourinary   Musculoskeletal   Abdominal   Peds  Hematology  (+) Blood dyscrasia, ,   Anesthesia Other Findings See surgeon's H&P   Reproductive/Obstetrics negative OB ROS                         Anesthesia Physical Anesthesia Plan  ASA: III  Anesthesia Plan: General   Post-op Pain Management:    Induction: Intravenous  Airway Management Planned: LMA  Additional Equipment:   Intra-op Plan:   Post-operative Plan:   Informed Consent: I have reviewed the patients History and Physical, chart, labs and discussed the procedure including the risks, benefits and alternatives for the proposed anesthesia with the patient or authorized representative who has indicated his/her understanding and acceptance.   Dental Advisory Given  Plan Discussed with: CRNA, Surgeon and Anesthesiologist  Anesthesia Plan Comments:        Anesthesia Quick Evaluation

## 2014-01-13 NOTE — Progress Notes (Signed)
[facility]

## 2014-01-13 NOTE — Evaluation (Signed)
Physical Therapy Evaluation Patient Details Name: Jennifer [name] MRN: [id number] DOB: 1961-04-11 Today's Date: 01/13/2014 Time: [id number] PT Time Calculation (min): 14 min  PT Assessment / Plan / Recommendation History of Present Illness  AMPUTATION RIGHT FIFTH TOE AND RIGHT SECOND TOE   Clinical Impression  Pt is supervision with transfers, will require w/c and RW for home, pt not sure about w/c fitting inside home.  Pt with 4 stairs to enter, friends to assist.  Pt will benefit from skilled PT services for pt/family education on stairs, gait and mobility with NWB precautions.    PT Assessment  Patient needs continued PT services    Follow Up Recommendations  Home health PT;Supervision for mobility/OOB    Does the patient have the potential to tolerate intense rehabilitation      Barriers to Discharge Inaccessible home environment;Decreased caregiver support stairs to enter, intermittent assistance    Equipment Recommendations  Rolling walker with 5" wheels;Wheelchair (measurements PT);Wheelchair cushion (measurements PT) (16x16 elevating leg rests)    Recommendations for Other Services     Frequency Min 3X/week    Precautions / Restrictions Precautions Precautions: Fall Restrictions Weight Bearing Restrictions: Yes RLE Weight Bearing: Non weight bearing   Pertinent Vitals/Pain No c/o pain      Mobility  Bed Mobility Overal bed mobility: Modified Independent Transfers Overall transfer level: Needs assistance Equipment used: Rolling walker (2 wheeled) Transfers: Stand Pivot Transfers Stand pivot transfers: Supervision General transfer comment: cues for hand placement and NWB    Exercises     PT Diagnosis: Difficulty walking  PT Problem List: Decreased activity tolerance;Decreased knowledge of use of DME;Decreased mobility;Decreased balance PT Treatment Interventions: DME instruction;Balance training;Functional mobility training;Patient/family education;Wheelchair  mobility training;Therapeutic activities;Gait training;Stair training;Therapeutic exercise     PT Goals(Current goals can be found in the care plan section) Acute Rehab PT Goals Patient Stated Goal: go home tomorrow PT Goal Formulation: With patient Time For Goal Achievement: 01/20/14 Potential to Achieve Goals: Good  Visit Information  Last PT Received On: 01/13/14 Assistance Needed: +1 History of Present Illness: AMPUTATION RIGHT FIFTH TOE AND RIGHT SECOND TOE        Prior Functioning  Home Living Family/patient expects to be discharged to:: Private residence Living Arrangements: Non-relatives/Friends Available Help at Discharge: Friend(s);Available PRN/intermittently Type of Home: House Home Access: Stairs to enter [employer] CorporationEntrance Stairs-Number of Steps: 4 Entrance Stairs-Rails: Can reach both Home Layout: One level Home Equipment: None Prior Function Level of Independence: Independent Communication Communication: No difficulties    Cognition  Cognition Arousal/Alertness: Awake/alert Behavior During Therapy: WFL for tasks assessed/performed Overall Cognitive Status: Within Functional Limits for tasks assessed    Extremity/Trunk Assessment Upper Extremity Assessment Upper Extremity Assessment: Overall WFL for tasks assessed Lower Extremity Assessment Lower Extremity Assessment: Overall WFL for tasks assessed Cervical / Trunk Assessment Cervical / Trunk Assessment: Normal   Balance    End of Session PT - End of Session Activity Tolerance: Patient tolerated treatment well Patient left: in bed;with call bell/phone within reach Nurse Communication: Mobility status  GP     Jennifer [name] 01/13/2014, 2:24 PM

## 2014-01-13 NOTE — Care Management Note (Signed)
    Page 1 of 2   01/15/2014     2:43:21 PM   CARE MANAGEMENT NOTE 01/15/2014  Patient:  Jennifer [name],Jennifer [name]   Account Number:  [id number]  Date Initiated:  01/13/2014  Documentation initiated by:  Letha CapeAYLOR,Natalyn Szymanowski  Subjective/Objective Assessment:   dx diabetic foot infection  admit- lives with friend.     Action/Plan:   Anticipated DC Date:  01/15/2014   Anticipated DC Plan:  HOME W HOME HEALTH SERVICES      DC Planning Services  CM consult      [facility]   Choice offered to / List presented to:  [name]-1 Patient        HH arranged  HH-2 PT      [facility]  [facility]   Status of service:  Completed, signed off Medicare Important Message given?   (If response is "NO", the following Medicare IM given date fields will be blank) Date Medicare IM given:   Date Additional Medicare IM given:    Discharge Disposition:  HOME W HOME HEALTH SERVICES  Per UR Regulation:  Reviewed for med. necessity/level of care/duration of stay  If discussed at Long Length of Stay Meetings, dates discussed:   01/15/2014    Comments:  3/5/125 1123 Letha Capeeborah Jenica Costilow RN,BSN [phone] patient is s/p ray amputation, awaiting ortho decision about abx, whether will be po or iv per MD.  [facility].  01/13/14 1650 Letha Capeeborah Jaythen Hamme RN, BSN [phone] patient will be living  with a friend at [address], [place] [address], phone [phone].  [facility], soc will begin 24-48 hrs post discharge.  Patient states she does not want a w/chair, but will need a rolling walker.  Patient states she will be able to do her dressings her self without a problem.  NCM informed her there will be a $20 co pay for hhpt and NCM will find out what the co pay is for DME and let her know tomorrow.

## 2014-01-13 NOTE — Progress Notes (Signed)
Utilization review completed.  

## 2014-01-14 DIAGNOSIS — D638 Anemia in other chronic diseases classified elsewhere: Secondary | ICD-10-CM

## 2014-01-14 LAB — BASIC METABOLIC PANEL
BUN: 9 mg/dL (ref 6–23)
CHLORIDE: 103 meq/L (ref 96–112)
CO2: 25 meq/L (ref 19–32)
CREATININE: 0.9 mg/dL (ref 0.50–1.10)
Calcium: 8.7 mg/dL (ref 8.4–10.5)
GFR calc Af Amer: 85 mL/min — ABNORMAL LOW (ref >90)
GFR calc non Af Amer: 73 mL/min — ABNORMAL LOW (ref >90)
Glucose, Bld: 228 mg/dL — ABNORMAL HIGH (ref 70–99)
Potassium: 4.3 mEq/L (ref 3.7–5.3)
Sodium: 140 mEq/L (ref 137–147)

## 2014-01-14 LAB — WOUND CULTURE: Special Requests: NORMAL

## 2014-01-14 LAB — GLUCOSE, CAPILLARY
GLUCOSE-CAPILLARY: 160 mg/dL — AB (ref 70–99)
GLUCOSE-CAPILLARY: 88 mg/dL (ref 70–99)
Glucose-Capillary: 201 mg/dL — ABNORMAL HIGH (ref 70–99)
Glucose-Capillary: 219 mg/dL — ABNORMAL HIGH (ref 70–99)

## 2014-01-14 MED ORDER — INSULIN GLARGINE 100 UNIT/ML ~~LOC~~ SOLN
15.0000 [IU] | Freq: Every day | SUBCUTANEOUS | Status: DC
  Administered 2014-01-14: 15 [IU] via SUBCUTANEOUS
  Filled 2014-01-14 (×2): qty 0.15

## 2014-01-14 MED ORDER — INSULIN ASPART 100 UNIT/ML ~~LOC~~ SOLN
3.0000 [IU] | Freq: Three times a day (TID) | SUBCUTANEOUS | Status: DC
  Administered 2014-01-14 – 2014-01-15 (×3): 3 [IU] via SUBCUTANEOUS

## 2014-01-14 MED ORDER — HYDRALAZINE HCL 20 MG/ML IJ SOLN
10.0000 mg | Freq: Three times a day (TID) | INTRAMUSCULAR | Status: DC | PRN
  Administered 2014-01-14: 10 mg via INTRAVENOUS
  Filled 2014-01-14: qty 1

## 2014-01-14 NOTE — Op Note (Signed)
NAMEJERSEE, WINIARSKI                ACCOUNT NO.:  [id number]  MEDICAL RECORD NO.:  [id number]  LOCATION:  5W33C                        FACILITY:  [facility]  PHYSICIAN:  Vonna Kotyk. Whitfield, M.D.DATE OF BIRTH:  September 17, 1961  DATE OF PROCEDURE:  01/13/2014 DATE OF DISCHARGE:                              OPERATIVE REPORT   PREOPERATIVE DIAGNOSES:  Diabetic peripheral vascular disease, right lower extremity, with abscess and osteomyelitis, right little toe, abscess at the tip of the right second toe.  POSTOPERATIVE DIAGNOSES:  Diabetic peripheral vascular disease, right lower extremity, with abscess and osteomyelitis, right little toe, abscess at the tip of the right second toe.  PROCEDURE: 1. Ray amputation of right little toe. 2. Terminal Syme amputation, right second toe.  SURGEON:  Vonna Kotyk. Durward Fortes, MD  ASSISTANT:  Mike Craze. Petrarca, PA-C.  ANESTHESIA:  General.  COMPLICATIONS:  None.  DESCRIPTION OF PROCEDURE:  Mr. Applegate was met in the holding area, identified the right foot as appropriate operative site.  The patient was then transported to room #3 and placed under general anesthesia without difficulty.  The right lower extremity was then prepped with DuraPrep from the tips of the toes to the knee.  Sterile draping was performed.  Time-out was called.  Examination of the right foot revealed a bulbous tip of the second toe with 1 small area of purulent drainage.  She has had a prior culture of the fifth toe which is tentatively growing a group of these strap.  The left little toe was edematous with multiple purulently draining sinuses and evidence of osteomyelitis of the distal phalanx.  Clinically, there is evidence of infection along the entire toe and probably entering the metatarsophalangeal joint.  First procedure was performed on the second toe.  I outlined the skin incision for an amputation through the DIP joint.  There were posterior and anterior flaps.  These  were sharply incised down to bone and then the tip of the toe was amputated through the DIP joint.  It did not appear to be any abnormal tissue remaining.  I did remove just a small portion of the middle phalanx distally for better wound closure.  Again did not encounter any purulence.  The flexor tendons were released as well as extensor tendons and then I closed the skin with interrupted 4-0 Ethilon with a nice closure.  I did use an Esmarch placed about the ankle for hemostasis.  Second procedure was performed in the little toe as described above. There were multiple areas of purulently draining sinuses.  We outlined ray amputation with a longitudinal incision along the distal half of the fifth metatarsal, and then curving along the base of the little toe dorsally and plantarly via sharp dissection.  Incision was carried directly down to bone.  The toe was then amputated through the metatarsophalangeal joint.  There was still some purulent tissue remaining just proximal to the metatarsal head, so I debrided that tissue leaving a thin layer of dermis dorsally.  Fortunately, we had made a conservative incision posteriorly unable to use the posterior flap and advanced anteriorly with good skin to skin coverage.  I amputated the distal third of the little metatarsal  angled medially with an oscillating saw.  The wound was then copiously irrigated with saline solution.  I did not see any evidence of further purulence noted within the soft tissue.  The tendons were allowed to retract as well as and I also sharply debrided the capsular tissue.  Wound was then irrigated with saline solution.  The skin was closed with interrupted 4-0 Ethilon.  We had a very nice closure.  We felt we had good skin to skin.  Tourniquet was released.  We had nice capillary refill to the remaining toes and soft tissue about the little metatarsal, a little ray amputation site.  Sterile bulky dressing was  applied with Xeroform gauze, bulky dressing, and an Ace bandage.  The patient was awoke and returned to the postanesthesia recovery in satisfactory condition.     Vonna Kotyk. Durward Fortes, M.D.     PWW/MEDQ  D:  01/13/2014  T:  01/14/2014  Job:  [id number]
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
## 2014-01-14 NOTE — Progress Notes (Signed)
Physical Therapy Treatment Patient Details Name: Jennifer [name] MRN: [id number] DOB: [birth year] Today's Date: 01/14/2014 Time: 4098-1191 PT Time Calculation (min): 18 min  PT Assessment / Plan / Recommendation  History of Present Illness AMPUTATION RIGHT FIFTH TOE AND RIGHT SECOND TOE    PT Comments   Treatment session focused on educating pt proper stair management technique. Pt at supervision level for stair management and mobility. Requires cues <50% of time to reinforce NWB status on Rt LE. Pt feels fairly confident with stair management. Will check in with pt tomorrow prior to D/C to reinforce safe mobility and management of stairs.   Follow Up Recommendations  Home health PT;Supervision for mobility/OOB     Does the patient have the potential to tolerate intense rehabilitation     Barriers to Discharge        Equipment Recommendations  Rolling walker with 5" wheels;Wheelchair (measurements PT);Wheelchair cushion (measurements PT)    Recommendations for Other Services    Frequency Min 3X/week   Progress towards PT Goals Progress towards PT goals: Progressing toward goals  Plan Current plan remains appropriate    Precautions / Restrictions Precautions Precautions: Fall Restrictions Weight Bearing Restrictions: Yes RLE Weight Bearing: Non weight bearing Other Position/Activity Restrictions: has CAM boot in room; no instructions on wear schedule    Pertinent Vitals/Pain "i havent had pain this whole time"     Mobility  Bed Mobility Overal bed mobility: Modified Independent General bed mobility comments: pt sitting in recliner and returnted to recliner  Transfers Overall transfer level: Needs assistance Equipment used: None Transfers: Sit to/from Stand Sit to Stand: Modified independent (Device/Increase time);Supervision Stand pivot transfers: Modified independent (Device/Increase time);Supervision General transfer comment: supervision for cues only; pt tends to WB  through Rt heel; required cues to maintain NWB status; using armrests for transfers  Ambulation/Gait Ambulation/Gait assistance: Supervision Ambulation Distance (Feet): 30 Feet Assistive device: None Gait Pattern/deviations:  (hop to- NWB Rt LE ) Gait velocity: decreased Gait velocity interpretation: Below normal speed for age/gender General Gait Details: pt furniture walking in room; encouraged to ambulate with RW to prevent WB through Rt LE; cues for sequencing and safety  Stairs: Yes Stairs assistance: Supervision Stair Management: Two rails;Step to pattern;Forwards Number of Stairs: 3 General stair comments: cues for sequencing and safety; pt able to hop fwd and maintain NWB on Rt LE; supervision for safety and cues          PT Diagnosis:    PT Problem List:   PT Treatment Interventions:     PT Goals (current goals can now be found in the care plan section) Acute Rehab PT Goals Patient Stated Goal: to go home asap  PT Goal Formulation: With patient Time For Goal Achievement: 01/20/14 Potential to Achieve Goals: Good  Visit Information  Last PT Received On: 01/14/14 Assistance Needed: +1 History of Present Illness: AMPUTATION RIGHT FIFTH TOE AND RIGHT SECOND TOE     Subjective Data  Subjective: pt sitting in chair; "i was wondering when i was going to practice the stairs"  Patient Stated Goal: to go home asap    Cognition  Cognition Arousal/Alertness: Awake/alert Behavior During Therapy: WFL for tasks assessed/performed Overall Cognitive Status: Within Functional Limits for tasks assessed    Balance  Balance Overall balance assessment: Needs assistance Sitting-balance support: Feet supported;No upper extremity supported Sitting balance-Leahy Scale: Good Standing balance support: During functional activity;Single extremity supported Standing balance-Leahy Scale: Fair Standing balance comment: leaning on furniture for support; cues to maintain  NWB status on Rt LE   End of Session PT - End of Session Equipment Utilized During Treatment: Gait belt Activity Tolerance: Patient tolerated treatment well Patient left: in chair;with call bell/phone within reach Nurse Communication: Mobility status   GP     Donell SievertWest, Treveon Bourcier N, [place] [phone] 01/14/2014, 4:10 PM

## 2014-01-14 NOTE — Progress Notes (Signed)
[facility] PROGRESS NOTE Interim History: 53 y.o. female with Past medical history of diabetes melitus, right great toe osteomyelitis. The patient is coming from home.  The patient presented with complaints of right fifth toe discharge and ulceration. 2 weeks ago she noted that there was a small blister on her fifth toe which gradually progressed into a large blister and then started draining to 3 days ago. She has been walking around and wears steel toe shoes and denies any injury to her legs. She also denies any neuropathy. She has noted that there is swelling of her right leg but does not have any tenderness in her leg or ankle or knee joint. Denies any fever chills constitutional symptoms.   Assessment/Plan:  Diabetic foot infection/Osteomyelitis - Right foot ulcer for the past 2-3 weeks, patient has diabetes but denies neuropathy.  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  DM2 (diabetes mellitus, type 2) - Hemoglobin A1c, last A1c from December of 2014 was 11 - Continues to be poorly controlled in the hospital too. Adjust insulins and monitor.  Elevated blood pressures/possible hypertension  - For now treat with when necessary IV hydralazine but may consider ACE inhibitor if persistently elevated  Anemia of chronic disease  - Hb stable.  Code Status: full  Disposition: Home when medically stable   Consultants:  Ortho  Procedures:  Amputation 3.3.2015  Antibiotics:  vanc and zosyn  HPI/Subjective: Denies pain in the right lower extremity.  Objective: Filed Vitals:   01/14/14 0651 01/14/14 0954 01/14/14 1414 01/14/14 1600  BP: 163/93 123/75 96/65 179/99  Pulse: 81 76 90 87  Temp:  97.6 F (36.4 C) 98.2 F (36.8 C)   TempSrc:  Oral Oral   Resp:  16 16   Height:      Weight:      SpO2:  98%  98%     Intake/Output Summary (Last 24 hours) at 01/14/14 1613 Last data filed at 01/14/14 1500  Gross per 24 hour  Intake 3898.33 ml  Output      0 ml  Net 3898.33 ml   Filed Weights   01/12/14 0306 01/13/14 0500 01/14/14 0405  Weight: 70.1 kg (154 lb 8.7 oz) 66.679 kg (147 lb) 68.221 kg (150 lb 6.4 oz)    Exam:  General: Alert, awake, oriented x3, in no acute distress.  HEENT: No bruits, no goiter.  Heart: Regular rate and rhythm, without murmurs, rubs, gallops.  Lungs: Good air movement, clear. Abdomen: Soft, nontender, nondistended, positive bowel sounds.  Extremities: Right foot dressing, clean and dry.   Data Reviewed: Basic Metabolic Panel:  Recent Labs Lab 01/10/14 1730 01/11/14 0545 01/12/14 0547 01/14/14 0535  NA 139 141 138 140  K 3.8 4.2 4.1 4.3  CL 101 105 102 103  CO2 24 25 26 25   GLUCOSE 139* 259* 173* 228*  BUN 18 16 13 9   CREATININE 0.83 0.87 0.90 0.90  CALCIUM 9.2 8.6 8.8 8.7   Liver Function Tests:  Recent Labs Lab 01/11/14 0545  AST 42*  ALT 29  ALKPHOS 124*  BILITOT 0.3  PROT 5.9*  ALBUMIN 2.3*   No results found for this basename: LIPASE, AMYLASE,  in the last 168 hours No results found for this basename: AMMONIA,  in the last 168 hours CBC:  Recent Labs Lab 01/10/14 1730 01/11/14 0545 01/12/14 0547  WBC 8.6 6.6 5.9  NEUTROABS 5.5  --   --   HGB 9.3* 8.7* 8.6*  HCT 27.3* 25.3* 25.4*  MCV 89.2 90.4 91.7  PLT 300 254 264   Cardiac Enzymes:  Recent Labs Lab 01/10/14 2008  CKTOTAL 250*   BNP (last 3 results) No results found for this basename: PROBNP,  in the last 8760 hours CBG:  Recent Labs Lab 01/13/14 1136 01/13/14 1705 01/13/14 2146 01/14/14 0750 01/14/14 1200  GLUCAP 116* 212* 248* 201* 160*    Recent Results (from the past 240 hour(s))  CULTURE, BLOOD (ROUTINE X 2)     Status: None   Collection Time    01/10/14  8:08 PM      Result Value Ref Range Status   Specimen Description BLOOD LEFT ARM    Final   Special Requests BOTTLES DRAWN AEROBIC AND ANAEROBIC B 10CC R 7CC   Final   Culture  Setup Time     Final   Value: 01/11/2014 02:58     [facility]   Culture     Final   Value:        BLOOD CULTURE RECEIVED NO GROWTH TO DATE CULTURE WILL BE HELD FOR 5 DAYS BEFORE ISSUING A FINAL NEGATIVE REPORT     Performed at [facility]   Report Status PENDING   Incomplete  CULTURE, BLOOD (ROUTINE X 2)     Status: None   Collection Time    01/10/14  8:17 PM      Result Value Ref Range Status   Specimen Description BLOOD LEFT HAND   Final   Special Requests BOTTLES DRAWN AEROBIC ONLY [facility]   Final   Culture  Setup Time     Final   Value: 01/11/2014 02:58     Performed at [facility]   Culture     Final   Value:        BLOOD CULTURE RECEIVED NO GROWTH TO DATE CULTURE WILL BE HELD FOR 5 DAYS BEFORE ISSUING A FINAL NEGATIVE REPORT     Performed at [facility]   Report Status PENDING   Incomplete  WOUND CULTURE     Status: None   Collection Time    01/11/14 11:26 AM      Result Value Ref Range Status   Specimen Description WOUND RIGHT TOE   Final   Special Requests Normal   Final   Gram Stain     Final   Value: RARE WBC PRESENT,BOTH PMN AND MONONUCLEAR     NO SQUAMOUS EPITHELIAL CELLS SEEN     RARE GRAM POSITIVE COCCI IN PAIRS     Performed at [facility]   Culture     Final   Value: ABUNDANT GROUP B STREP(S.AGALACTIAE)ISOLATED     Note: TESTING AGAINST S. AGALACTIAE NOT ROUTINELY PERFORMED DUE TO PREDICTABILITY OF AMP/PEN/VAN SUSCEPTIBILITY.     Performed at [facility]   Report Status 01/14/2014 FINAL   Final     Studies: No results found.  Scheduled Meds: . insulin aspart  0-15 Units Subcutaneous TID WC  . insulin aspart  0-5 Units Subcutaneous QHS  . insulin glargine  10 Units Subcutaneous QHS  . piperacillin-tazobactam (ZOSYN)  IV  3.375 g Intravenous 3 times per day  . vancomycin  1,250 mg Intravenous Q12H    Continuous Infusions: . sodium chloride 100 mL/hr at 01/14/14 1500     [facility] CenterNGALGI,[place]  [facility] Pager [phone]. If  8PM-8AM, please contact night-coverage at [email or web address], password [facility] 01/14/2014, 4:13 PM  LOS: 4 days

## 2014-01-14 NOTE — Progress Notes (Addendum)
Inpatient Diabetes Program Recommendations  AACE/ADA: New Consensus Statement on Inpatient Glycemic Control (2013)  Target Ranges:  Prepandial:   less than 140 mg/dL      Peak postprandial:   less than 180 mg/dL (1-2 hours)      Critically ill patients:  140 - 180 mg/dL     Results for [name], William C (MRN [id number]) as of 01/14/2014 10:10  Ref. Range 01/13/2014 07:51 01/13/2014 11:36 01/13/2014 17:05 01/13/2014 21:46  Glucose-Capillary Latest Range: 70-99 mg/dL 409174 (H) 811116 (H) 914212 (H) 248 (H)    Results for [name], Abigial C (MRN [id number]) as of 01/14/2014 10:10  Ref. Range 01/14/2014 07:50  Glucose-Capillary Latest Range: 70-99 mg/dL 086201 (H)     **S/P toe amputation.  CBGs above goal.  **Patient was NPO yesterday.  Now back on PO diet.   **MD- Please consider the following in-hospital insulin adjustments:  1. Increase Lantus to 13 units QHS (currently ordered as Lantus 10 units QHS) 2. Add Novolog meal coverage- Novolog 4 units tid with meals   Will follow. Ambrose FinlandJeannine Johnston Cearra Portnoy RN, MSN, CDE Diabetes Coordinator Inpatient Diabetes Program Team Pager: [phone] (8a-10p)

## 2014-01-14 NOTE — Evaluation (Signed)
Occupational Therapy Evaluation Patient Details Name: Jennifer [name] MRN: [id number] DOB: 10-01-1961 Today's Date: 01/14/2014 Time: 6498-5190 OT Time Calculation (min): 28 min  OT Assessment / Plan / Recommendation History of present illness AMPUTATION RIGHT FIFTH TOE AND RIGHT SECOND TOE    Clinical Impression   Pt is at set up/sup - min guard A level with LB ADLs and sup - Mod I level with ADL mobility. All education comlpleted and no further acute OT services indicated at this time. OT will sign off    OT Assessment  All further OT needs can be met in the next venue of care    Follow Up Recommendations  No OT follow up;Supervision - Intermittent (during mobility)    Barriers to Discharge  none    Equipment Recommendations  Tub/shower seat    Recommendations for Other Services  none  Frequency       Precautions / Restrictions Precautions Precautions: Fall Restrictions Weight Bearing Restrictions: Yes RLE Weight Bearing: Non weight bearing   Pertinent Vitals/Pain 1/10 R foot pain    ADL  Grooming: Performed;Wash/dry hands;Wash/dry face;Set up;Supervision/safety Where Assessed - Grooming: Unsupported sitting Upper Body Bathing: Simulated;Set up Lower Body Bathing: Simulated;Set up;Supervision/safety;Min guard Where Assessed - Lower Body Bathing: Unsupported sitting;Supported sit to stand Upper Body Dressing: Performed;Set up Lower Body Dressing: Performed;Supervision/safety;Set up;Min guard Where Assessed - Lower Body Dressing: Unsupported sitting;Supported sit to stand Toilet Transfer: Performed;Supervision/safety;Modified independent Toilet Transfer Method: Sit to stand;Stand pivot Acupuncturist: Regular height toilet;Grab bars Toileting - Clothing Manipulation and Hygiene: Performed;Supervision/safety;Min guard Where Assessed - Engineer, mining and Hygiene: Standing Tub/Shower Transfer: Simulated;Supervision/safety;Modified  independent Psychologist, educational: Shower seat without back Transfers/Ambulation Related to ADLs: cues for control of descent ADL Comments: pt will not need any ADL A/E for bathing/dressing. Pt is able to perform with set up while seated and sup/min guard  A during  sit - stand. Pt eduacted on leaning L - R for bathing/dressing LB while seated    OT Diagnosis: Acute pain  OT Problem List: Decreased knowledge of use of DME or AE;Impaired balance (sitting and/or standing);Pain OT Treatment Interventions:     OT Goals(Current goals can be found in the care plan section) Acute Rehab OT Goals Patient Stated Goal: go home tomorrow  Visit Information  Last OT Received On: 01/14/14 History of Present Illness: AMPUTATION RIGHT FIFTH TOE AND RIGHT SECOND TOE        Prior Functioning     Home Living Family/patient expects to be discharged to:: Private residence Living Arrangements: Non-relatives/Friends;Other (Comment) (lives with boyfriend) Available Help at Discharge: Friend(s);Available PRN/intermittently Type of Home: House Home Access: Stairs to enter [employer] of Steps: 4 Entrance Stairs-Rails: Can reach both Home Layout: One level Home Equipment: None Prior Function Level of Independence: Independent Communication Communication: No difficulties Dominant Hand: Right         Vision/Perception Vision - History Baseline Vision: No visual deficits Patient Visual Report: No change from baseline Perception Perception: Within Functional Limits   Cognition  Cognition Arousal/Alertness: Awake/alert Behavior During Therapy: WFL for tasks assessed/performed Overall Cognitive Status: Within Functional Limits for tasks assessed    Extremity/Trunk Assessment Upper Extremity Assessment Upper Extremity Assessment: Overall WFL for tasks assessed Lower Extremity Assessment Lower Extremity Assessment: Defer to PT evaluation Cervical / Trunk Assessment Cervical  / Trunk Assessment: Normal     Mobility Bed Mobility Overal bed mobility: Modified Independent Transfers Overall transfer level: Needs assistance Equipment used: Rolling walker (2 wheeled) Transfers:  Sit to/from [facility] Sit to Stand: Modified independent (Device/Increase time);Supervision Stand pivot transfers: Modified independent (Device/Increase time);Supervision General transfer comment: cues for control of descent          Balance Balance Overall balance assessment: Needs assistance Sitting-balance support: No upper extremity supported;Feet supported Sitting balance-Leahy Scale: Good Standing balance support: Single extremity supported;During functional activity Standing balance-Leahy Scale: Fair Standing balance comment: able to maintain NWB, leaning on wall to pull up pants   End of Session OT - End of Session Activity Tolerance: Patient tolerated treatment well Patient left: Other (comment);in chair;with call bell/phone within reach  GO     Britt Bottom 01/14/2014, 1:17 PM

## 2014-01-14 NOTE — Progress Notes (Signed)
Patient's b/p has been increasing over the last couple of days. Patient's b/p this morning is 163/93. RN has had b/p rechecked this morning three different times and it is still high. RN will pass on to the day nurse to contact the MD to see if any further orders are needed. Will continue to monitor

## 2014-01-15 LAB — CBC
HEMATOCRIT: 28 % — AB (ref 36.0–46.0)
Hemoglobin: 9.3 g/dL — ABNORMAL LOW (ref 12.0–15.0)
MCH: 30.1 pg (ref 26.0–34.0)
MCHC: 33.2 g/dL (ref 30.0–36.0)
MCV: 90.6 fL (ref 78.0–100.0)
Platelets: 278 10*3/uL (ref 150–400)
RBC: 3.09 MIL/uL — ABNORMAL LOW (ref 3.87–5.11)
RDW: 12.3 % (ref 11.5–15.5)
WBC: 5.3 10*3/uL (ref 4.0–10.5)

## 2014-01-15 LAB — GLUCOSE, CAPILLARY
GLUCOSE-CAPILLARY: 324 mg/dL — AB (ref 70–99)
Glucose-Capillary: 133 mg/dL — ABNORMAL HIGH (ref 70–99)
Glucose-Capillary: 94 mg/dL (ref 70–99)

## 2014-01-15 MED ORDER — AMOXICILLIN 500 MG PO TABS: 500.0000 mg | ORAL_TABLET | Freq: Three times a day (TID) | ORAL | Status: DC

## 2014-01-15 MED ORDER — OXYCODONE-ACETAMINOPHEN 5-325 MG PO TABS: 1.0000 | ORAL_TABLET | ORAL | Status: AC | PRN

## 2014-01-15 NOTE — Progress Notes (Signed)
Orthopedic Tech Progress Note Patient Details:  Jennifer [name] 1960/11/18 [id number] Went to see patient in room. Patient already had CAM walker that she stated doctor had just applied earlier today. No action needed from Ortho Tech Patient ID: Jennifer [name], female   DOB: 1960/11/18, 53 y.o.   MRN: [id number]   Jennifer [name] 01/15/2014, 2:38 PM

## 2014-01-15 NOTE — Discharge Instructions (Signed)
[facility].  DO NOT WET THE DRESSING.  MAINTAIN NON WEIGHT BEARING ON RIGHT LEG.  EQUALIZER BOOT WHEN OUT OF BED.  ANTIBIOTICS AS PRESCRIBED.

## 2014-01-15 NOTE — Progress Notes (Signed)
Patient was discharged home by MD order; discharged instructions review and give to patient with care notes and prescriptions; IV DIC; skin intact; patient will be escorted to the car by nurse tech via wheelchair.  

## 2014-01-15 NOTE — Progress Notes (Signed)
PT Cancellation Note  Patient Details Name: Oren Beckmannammy C Tsuda MRN: [id number] DOB: [birth year]   Cancelled Treatment:    Reason Eval/Treat Not Completed: Other (comment) (Pt reports ready to go home.)   [facility],Cuthbert Turton 01/15/2014, 3:08 PM

## 2014-01-15 NOTE — Discharge Summary (Signed)
Physician Discharge Summary  Jennifer [name] VQM:[id number] DOB: [birth year] DOA: 01/10/2014  PCP: Blanchie Serve, MD  Admit date: 01/10/2014 Discharge date: 01/15/2014  Time spent: Greater than 30 minutes  Recommendations for Outpatient Follow-up:  1. Dr. Joni Fears, orthopedics on 01/23/14 2. Dr. Blanchie Serve, PCP in 5 days with repeat labs (CBC and BMP) and for diabetes management. Please follow final blood culture results that were sent from the hospital. 3. KEEP DRESSING ON TILL SEEN IN DR [facility] OFFICE ON Friday, January 23, 2014. DO NOT WET THE DRESSING. MAINTAIN NON WEIGHT BEARING ON RIGHT LEG. EQUALIZER BOOT WHEN OUT OF BED. ANTIBIOTICS AS PRESCRIBED 4. Home Health PT.   Discharge Diagnoses:  Principal Problem:   Osteomyelitis Active Problems:   DM2 (diabetes mellitus, type 2)   Diabetic foot infection   Neuropathic diabetic ulcer of foot   Discharge Condition: Improved & Stable  Diet recommendation: Diabetic and heart healthy.  Filed Weights   01/13/14 0500 01/14/14 0405 01/15/14 0711  Weight: 66.679 kg (147 lb) 68.221 kg (150 lb 6.4 oz) 68.5 kg (151 lb 0.2 oz)    History of present illness & hospital course:  53 year old female with history of type II DM/IDDM, hypertension-not on medications PTA, was admitted on 01/10/14 with complaints of right fifth toe ulceration and discharge. This initially started as a small blister on her fifth toe which gradually progressed and started draining 3 days prior to admission. She denied fever or chills. She denies prior history of peripheral neuropathy. In the ED, x-ray was highly suspicious for osteomyelitis of the toe. She was admitted and started empirically on broad-spectrum IV antibiotics including vancomycin and Zosyn. Orthopedics was consulted. 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 Postoperatively, patient denied pain and was continued on IV antibiotics for  additional 24-36 hours. Operative wound culture showed abundant group B strep and blood cultures x2 from admission are negative to date. Orthopedics has seen her today and cleared her for DC home. Discussed with infectious disease M.D. on call who recommended oral amoxicillin as antibiotic on discharge. Discussed with Dr. Durward Fortes who indicated that he was able to take out all infected bone and agreed that a week of oral amoxicillin was reasonable. He will reassess in office on 3/13 and determination can be made if she needs any further antibiotics. Wound care and weight-bearing status as per orthopedics. Patient's diabetes control was fluctuating. It was not optimal as outpatient as evidenced by hemoglobin A1c of 7.9. However her A1c is better than December 2014 when it was 11.6. She will be discharged on prior home doses of Lantus and NovoLog and is advised to followup with her PCP for further management. She had periodic elevations of her blood pressures which were not sustained. May consider starting her on ACE inhibitors or ARB as outpatient. Her anemia is likely of chronic disease and remained stable.  Consultations:  Orthopedics  Procedures: PROCEDURE:  1. Ray amputation of right little toe.  2. Terminal Syme amputation, right second toe.     Discharge Exam:  Complaints:  Patient denied pain in her operated right foot. She was anxious to go home.  Filed Vitals:   01/15/14 0240 01/15/14 0528 01/15/14 0711 01/15/14 1409  BP: 130/79 142/84  120/65  Pulse: 75 76  91  Temp: 98.6 F (37 C) 98.3 F (36.8 C)  98.1 F (36.7 C)  TempSrc: Oral Oral  Oral  Resp: 18 18  18  Height:      Weight:   68.5 kg (151 lb 0.2 oz)   SpO2: 95% 96%  97%    General: Alert, awake, oriented x3, in no acute distress.  HEENT: No bruits, no goiter.  Heart: Regular rate and rhythm, without murmurs, rubs, gallops.  Lungs: Good air movement, clear.  Abdomen: Soft, nontender, nondistended, positive bowel  sounds.  Extremities: Right foot dressing, clean and dry.   Discharge Instructions      Discharge Orders   Future Appointments Provider Department Dept Phone   02/04/2014 9:45 AM Blanchie Serve, MD [facility] [phone]   Future Orders Complete By Expires   Call MD for:  redness, tenderness, or signs of infection (pain, swelling, redness, odor or green/yellow discharge around incision site)  As directed    Call MD for:  severe uncontrolled pain  As directed    Call MD for:  temperature >100.4  As directed    Diet - low sodium heart healthy  As directed    Diet Carb Modified  As directed    Increase activity slowly  As directed    Non weight bearing  As directed    Scheduling Instructions:     WITH CRUTCHES OR WALKER   Questions:     Laterality:  right   Extremity:  Lower       Medication List         amoxicillin 500 MG tablet  Commonly known as:  AMOXIL  Take 1 tablet (500 mg total) by mouth 3 (three) times daily.     insulin aspart 100 UNIT/ML injection  Commonly known as:  novoLOG  Inject 3-15 Units into the skin 3 (three) times daily before meals. *per sliding scale*     insulin glargine 100 UNIT/ML injection  Commonly known as:  LANTUS  Inject 10 Units into the skin at bedtime.     oxyCODONE-acetaminophen 5-325 MG per tablet  Commonly known as:  PERCOCET/ROXICET  Take 1-2 tablets by mouth every 4 (four) hours as needed for severe pain.       Follow-up Information   Follow up with [facility], Vonna Kotyk, MD. Schedule an appointment as soon as possible for a visit on 01/23/2014.   Specialty:  Orthopedic Surgery   Contact information:   [address] [place] [address] [phone]       Follow up with Blanchie Serve, MD. Schedule an appointment as soon as possible for a visit in 5 days. (To be seen with repeat labs (CBC & BMP) & for Diabetes management.)    Specialty:  Internal Medicine   Contact information:   [place] [address] [phone]        The results of significant diagnostics from this hospitalization (including imaging, microbiology, ancillary and laboratory) are listed below for reference.    Significant Diagnostic Studies:  Dg Foot Complete Right  01/10/2014   CLINICAL DATA:  Diabetes, little toe infection  EXAM: RIGHT FOOT COMPLETE - 3+ VIEW  COMPARISON:  11/04/2013  FINDINGS: Three views of the right foot submitted. The patient is status post amputation of distal phalanx great toe. There is soft tissue swelling fifth toe. There is cortical irregularity lateral aspect of distal phalanx fifth toe. There is soft tissue defect adjacent to distal phalanx. Findings are highly suspicious for osteomyelitis. No acute fracture or subluxation.  IMPRESSION: The patient is status post amputation of distal phalanx great toe. There is soft tissue swelling  fifth toe. There is cortical irregularity lateral aspect of distal phalanx fifth toe. There is soft tissue defect adjacent to distal phalanx. Findings are highly suspicious for osteomyelitis. No acute fracture or subluxation.   Electronically Signed   By: Lahoma Crocker M.D.   On: 01/10/2014 17:25    Microbiology: Recent Results (from the past 240 hour(s))  CULTURE, BLOOD (ROUTINE X 2)     Status: None   Collection Time    01/10/14  8:08 PM      Result Value Ref Range Status   Specimen Description BLOOD LEFT ARM   Final   Special Requests BOTTLES DRAWN AEROBIC AND ANAEROBIC B 10CC R [place]   Final   Culture  Setup Time     Final   Value: 01/11/2014 02:58     Performed at [facility]   Culture     Final   Value:        BLOOD CULTURE RECEIVED NO GROWTH TO DATE CULTURE WILL BE HELD FOR 5 DAYS BEFORE ISSUING A FINAL NEGATIVE REPORT     Performed at [facility]   Report Status PENDING   Incomplete  CULTURE, BLOOD (ROUTINE X 2)     Status: None   Collection Time    01/10/14  8:17 PM      Result Value Ref Range Status    Specimen Description BLOOD LEFT HAND   Final   Special Requests BOTTLES DRAWN AEROBIC ONLY [facility]   Final   Culture  Setup Time     Final   Value: 01/11/2014 02:58     Performed at [facility]   Culture     Final   Value:        BLOOD CULTURE RECEIVED NO GROWTH TO DATE CULTURE WILL BE HELD FOR 5 DAYS BEFORE ISSUING A FINAL NEGATIVE REPORT     Performed at [facility]   Report Status PENDING   Incomplete  WOUND CULTURE     Status: None   Collection Time    01/11/14 11:26 AM      Result Value Ref Range Status   Specimen Description WOUND RIGHT TOE   Final   Special Requests Normal   Final   Gram Stain     Final   Value: RARE WBC PRESENT,BOTH PMN AND MONONUCLEAR     NO SQUAMOUS EPITHELIAL CELLS SEEN     RARE GRAM POSITIVE COCCI IN PAIRS     Performed at [facility]   Culture     Final   Value: ABUNDANT GROUP B STREP(S.AGALACTIAE)ISOLATED     Note: TESTING AGAINST S. AGALACTIAE NOT ROUTINELY PERFORMED DUE TO PREDICTABILITY OF AMP/PEN/VAN SUSCEPTIBILITY.     Performed at [facility]   Report Status 01/14/2014 FINAL   Final     Labs: Basic Metabolic Panel:  Recent Labs Lab 01/10/14 1730 01/11/14 0545 01/12/14 0547 01/14/14 0535  NA 139 141 138 140  K 3.8 4.2 4.1 4.3  CL 101 105 102 103  CO2 $Re'24 25 26 25  'wmw$ GLUCOSE 139* 259* 173* 228*  BUN $Re'18 16 13 9  'plY$ CREATININE 0.83 0.87 0.90 0.90  CALCIUM 9.2 8.6 8.8 8.7   Liver Function Tests:  Recent Labs Lab 01/11/14 0545  AST 42*  ALT 29  ALKPHOS 124*  BILITOT 0.3  PROT 5.9*  ALBUMIN 2.3*   No results found for this basename: LIPASE, AMYLASE,  in the last 168 hours No results found for this basename: AMMONIA,  in the last 168 hours CBC:  Recent Labs Lab 01/10/14 1730 01/11/14 0545 01/12/14 0547 01/15/14 0427  WBC 8.6 6.6 5.9 5.3  NEUTROABS 5.5  --   --   --   HGB 9.3* 8.7* 8.6* 9.3*  HCT 27.3* 25.3* 25.4* 28.0*  MCV 89.2 90.4 91.7 90.6  PLT 300 254 264 278   Cardiac  Enzymes:  Recent Labs Lab 01/10/14 2008  CKTOTAL 250*   BNP: BNP (last 3 results) No results found for this basename: PROBNP,  in the last 8760 hours CBG:  Recent Labs Lab 01/14/14 1200 01/14/14 1653 01/14/14 2133 01/15/14 0753 01/15/14 1204  GLUCAP 160* 219* 88 133* 94    Additional labs: 1. ESR: 93 2. TSH: 7.9   Signed:  Vernell Leep, MD, FACP, FHM. [facility] Pager [phone]  If 7PM-7AM, please contact night-coverage [email or web address] Password [facility] 01/15/2014, 4:26 PM

## 2014-01-16 ENCOUNTER — Telehealth: Payer: Self-pay | Admitting: *Deleted

## 2014-01-16 NOTE — Telephone Encounter (Signed)
Follow up with Oneal GroutPANDEY, MAHIMA, MD. Schedule an appointment as soon as possible for a visit in 5 days. (To be seen with repeat labs (CBC & BMP) & for Diabetes management.)   appt has been scheduled with Shanda BumpsJessica on 01/21/14 @ 10:45 am due to your schedule was full up to 3/24.

## 2014-01-17 LAB — CULTURE, BLOOD (ROUTINE X 2)
CULTURE: NO GROWTH
Culture: NO GROWTH

## 2014-01-20 ENCOUNTER — Encounter (HOSPITAL_COMMUNITY): Payer: Self-pay | Admitting: Orthopaedic Surgery

## 2014-01-21 ENCOUNTER — Ambulatory Visit: Payer: Self-pay | Admitting: Nurse Practitioner

## 2014-01-21 DIAGNOSIS — Z4789 Encounter for other orthopedic aftercare: Secondary | ICD-10-CM

## 2014-01-21 DIAGNOSIS — E1159 Type 2 diabetes mellitus with other circulatory complications: Secondary | ICD-10-CM

## 2014-01-21 DIAGNOSIS — IMO0001 Reserved for inherently not codable concepts without codable children: Secondary | ICD-10-CM

## 2014-01-21 DIAGNOSIS — I798 Other disorders of arteries, arterioles and capillaries in diseases classified elsewhere: Secondary | ICD-10-CM

## 2014-01-24 ENCOUNTER — Encounter (HOSPITAL_COMMUNITY): Payer: Self-pay | Admitting: Emergency Medicine

## 2014-01-24 ENCOUNTER — Inpatient Hospital Stay (HOSPITAL_COMMUNITY): Payer: BC Managed Care – PPO

## 2014-01-24 ENCOUNTER — Inpatient Hospital Stay (HOSPITAL_COMMUNITY)
Admission: EM | Admit: 2014-01-24 | Discharge: 2014-02-11 | DRG: 637 | Disposition: E | Payer: BC Managed Care – PPO | Attending: Internal Medicine | Admitting: Internal Medicine

## 2014-01-24 DIAGNOSIS — Z515 Encounter for palliative care: Secondary | ICD-10-CM

## 2014-01-24 DIAGNOSIS — K3184 Gastroparesis: Secondary | ICD-10-CM

## 2014-01-24 DIAGNOSIS — E119 Type 2 diabetes mellitus without complications: Secondary | ICD-10-CM

## 2014-01-24 DIAGNOSIS — E11628 Type 2 diabetes mellitus with other skin complications: Secondary | ICD-10-CM

## 2014-01-24 DIAGNOSIS — R1110 Vomiting, unspecified: Secondary | ICD-10-CM

## 2014-01-24 DIAGNOSIS — N179 Acute kidney failure, unspecified: Secondary | ICD-10-CM

## 2014-01-24 DIAGNOSIS — M9080 Osteopathy in diseases classified elsewhere, unspecified site: Secondary | ICD-10-CM | POA: Diagnosis present

## 2014-01-24 DIAGNOSIS — K209 Esophagitis, unspecified without bleeding: Secondary | ICD-10-CM

## 2014-01-24 DIAGNOSIS — E118 Type 2 diabetes mellitus with unspecified complications: Secondary | ICD-10-CM

## 2014-01-24 DIAGNOSIS — L97509 Non-pressure chronic ulcer of other part of unspecified foot with unspecified severity: Secondary | ICD-10-CM | POA: Diagnosis present

## 2014-01-24 DIAGNOSIS — Z833 Family history of diabetes mellitus: Secondary | ICD-10-CM

## 2014-01-24 DIAGNOSIS — R1115 Cyclical vomiting syndrome unrelated to migraine: Secondary | ICD-10-CM

## 2014-01-24 DIAGNOSIS — I469 Cardiac arrest, cause unspecified: Secondary | ICD-10-CM

## 2014-01-24 DIAGNOSIS — Z89421 Acquired absence of other right toe(s): Secondary | ICD-10-CM

## 2014-01-24 DIAGNOSIS — Z66 Do not resuscitate: Secondary | ICD-10-CM | POA: Diagnosis not present

## 2014-01-24 DIAGNOSIS — K7200 Acute and subacute hepatic failure without coma: Secondary | ICD-10-CM | POA: Diagnosis present

## 2014-01-24 DIAGNOSIS — D638 Anemia in other chronic diseases classified elsewhere: Secondary | ICD-10-CM | POA: Diagnosis present

## 2014-01-24 DIAGNOSIS — K2100 Gastro-esophageal reflux disease with esophagitis, without bleeding: Secondary | ICD-10-CM | POA: Diagnosis present

## 2014-01-24 DIAGNOSIS — B961 Klebsiella pneumoniae [K. pneumoniae] as the cause of diseases classified elsewhere: Secondary | ICD-10-CM | POA: Diagnosis present

## 2014-01-24 DIAGNOSIS — L089 Local infection of the skin and subcutaneous tissue, unspecified: Secondary | ICD-10-CM

## 2014-01-24 DIAGNOSIS — S98131A Complete traumatic amputation of one right lesser toe, initial encounter: Secondary | ICD-10-CM | POA: Diagnosis present

## 2014-01-24 DIAGNOSIS — G931 Anoxic brain damage, not elsewhere classified: Secondary | ICD-10-CM

## 2014-01-24 DIAGNOSIS — E1169 Type 2 diabetes mellitus with other specified complication: Secondary | ICD-10-CM

## 2014-01-24 DIAGNOSIS — I2510 Atherosclerotic heart disease of native coronary artery without angina pectoris: Secondary | ICD-10-CM | POA: Diagnosis present

## 2014-01-24 DIAGNOSIS — S98139A Complete traumatic amputation of one unspecified lesser toe, initial encounter: Secondary | ICD-10-CM

## 2014-01-24 DIAGNOSIS — R34 Anuria and oliguria: Secondary | ICD-10-CM | POA: Diagnosis present

## 2014-01-24 DIAGNOSIS — M869 Osteomyelitis, unspecified: Secondary | ICD-10-CM

## 2014-01-24 DIAGNOSIS — R579 Shock, unspecified: Secondary | ICD-10-CM

## 2014-01-24 DIAGNOSIS — Z794 Long term (current) use of insulin: Secondary | ICD-10-CM

## 2014-01-24 DIAGNOSIS — E876 Hypokalemia: Secondary | ICD-10-CM

## 2014-01-24 DIAGNOSIS — R4182 Altered mental status, unspecified: Secondary | ICD-10-CM

## 2014-01-24 DIAGNOSIS — J9600 Acute respiratory failure, unspecified whether with hypoxia or hypercapnia: Secondary | ICD-10-CM

## 2014-01-24 DIAGNOSIS — R570 Cardiogenic shock: Secondary | ICD-10-CM

## 2014-01-24 DIAGNOSIS — R748 Abnormal levels of other serum enzymes: Secondary | ICD-10-CM

## 2014-01-24 DIAGNOSIS — E1142 Type 2 diabetes mellitus with diabetic polyneuropathy: Secondary | ICD-10-CM | POA: Diagnosis present

## 2014-01-24 DIAGNOSIS — E11621 Type 2 diabetes mellitus with foot ulcer: Secondary | ICD-10-CM

## 2014-01-24 DIAGNOSIS — R4020 Unspecified coma: Secondary | ICD-10-CM | POA: Diagnosis not present

## 2014-01-24 DIAGNOSIS — I10 Essential (primary) hypertension: Secondary | ICD-10-CM

## 2014-01-24 DIAGNOSIS — K5900 Constipation, unspecified: Secondary | ICD-10-CM

## 2014-01-24 DIAGNOSIS — E1165 Type 2 diabetes mellitus with hyperglycemia: Secondary | ICD-10-CM

## 2014-01-24 DIAGNOSIS — E1143 Type 2 diabetes mellitus with diabetic autonomic (poly)neuropathy: Secondary | ICD-10-CM

## 2014-01-24 DIAGNOSIS — E1140 Type 2 diabetes mellitus with diabetic neuropathy, unspecified: Secondary | ICD-10-CM | POA: Diagnosis present

## 2014-01-24 DIAGNOSIS — IMO0002 Reserved for concepts with insufficient information to code with codable children: Principal | ICD-10-CM

## 2014-01-24 DIAGNOSIS — N390 Urinary tract infection, site not specified: Secondary | ICD-10-CM

## 2014-01-24 DIAGNOSIS — R112 Nausea with vomiting, unspecified: Secondary | ICD-10-CM

## 2014-01-24 DIAGNOSIS — E1149 Type 2 diabetes mellitus with other diabetic neurological complication: Secondary | ICD-10-CM | POA: Diagnosis present

## 2014-01-24 DIAGNOSIS — R197 Diarrhea, unspecified: Secondary | ICD-10-CM | POA: Diagnosis present

## 2014-01-24 DIAGNOSIS — R110 Nausea: Secondary | ICD-10-CM | POA: Diagnosis present

## 2014-01-24 LAB — COMPREHENSIVE METABOLIC PANEL
ALT: 9 U/L (ref 0–35)
AST: 13 U/L (ref 0–37)
Albumin: 3.7 g/dL (ref 3.5–5.2)
Alkaline Phosphatase: 121 U/L — ABNORMAL HIGH (ref 39–117)
BUN: 28 mg/dL — ABNORMAL HIGH (ref 6–23)
CO2: 26 mEq/L (ref 19–32)
Calcium: 10.3 mg/dL (ref 8.4–10.5)
Chloride: 100 mEq/L (ref 96–112)
Creatinine, Ser: 2.22 mg/dL — ABNORMAL HIGH (ref 0.50–1.10)
GFR calc Af Amer: 29 mL/min — ABNORMAL LOW (ref >90)
GFR calc non Af Amer: 25 mL/min — ABNORMAL LOW (ref >90)
Glucose, Bld: 166 mg/dL — ABNORMAL HIGH (ref 70–99)
Potassium: 4.1 mEq/L (ref 3.7–5.3)
Sodium: 142 mEq/L (ref 137–147)
Total Bilirubin: 0.4 mg/dL (ref 0.3–1.2)
Total Protein: 8.1 g/dL (ref 6.0–8.3)

## 2014-01-24 LAB — URINALYSIS, ROUTINE W REFLEX MICROSCOPIC
Bilirubin Urine: NEGATIVE
Glucose, UA: NEGATIVE mg/dL
Hgb urine dipstick: NEGATIVE
Ketones, ur: 15 mg/dL — AB
Nitrite: NEGATIVE
Protein, ur: 100 mg/dL — AB
Specific Gravity, Urine: 1.016 (ref 1.005–1.030)
Urobilinogen, UA: 0.2 mg/dL (ref 0.0–1.0)
pH: 6 (ref 5.0–8.0)

## 2014-01-24 LAB — URINE MICROSCOPIC-ADD ON

## 2014-01-24 LAB — BASIC METABOLIC PANEL
BUN: 26 mg/dL — ABNORMAL HIGH (ref 6–23)
BUN: 26 mg/dL — AB (ref 6–23)
CHLORIDE: 100 meq/L (ref 96–112)
CHLORIDE: 103 meq/L (ref 96–112)
CO2: 23 mEq/L (ref 19–32)
CO2: 22 meq/L (ref 19–32)
CREATININE: 2.01 mg/dL — AB (ref 0.50–1.10)
Calcium: 9.5 mg/dL (ref 8.4–10.5)
Calcium: 8.8 mg/dL (ref 8.4–10.5)
Creatinine, Ser: 2 mg/dL — ABNORMAL HIGH (ref 0.50–1.10)
GFR calc Af Amer: 32 mL/min — ABNORMAL LOW (ref >90)
GFR calc Af Amer: 32 mL/min — ABNORMAL LOW (ref >90)
GFR calc non Af Amer: 28 mL/min — ABNORMAL LOW (ref >90)
GFR calc non Af Amer: 28 mL/min — ABNORMAL LOW (ref >90)
GLUCOSE: 150 mg/dL — AB (ref 70–99)
GLUCOSE: 184 mg/dL — AB (ref 70–99)
POTASSIUM: 4.1 meq/L (ref 3.7–5.3)
Potassium: 3.7 mEq/L (ref 3.7–5.3)
Sodium: 141 mEq/L (ref 137–147)
Sodium: 141 mEq/L (ref 137–147)

## 2014-01-24 LAB — LIPID PANEL
CHOL/HDL RATIO: 5.7 ratio
Cholesterol: 210 mg/dL — ABNORMAL HIGH (ref 0–200)
HDL: 37 mg/dL — ABNORMAL LOW (ref >39)
LDL Cholesterol: 151 mg/dL — ABNORMAL HIGH (ref 0–99)
Triglycerides: 112 mg/dL (ref <150)
VLDL: 22 mg/dL (ref 0–40)

## 2014-01-24 LAB — CBC
HCT: 32.8 % — ABNORMAL LOW (ref 36.0–46.0)
HCT: 31.7 % — ABNORMAL LOW (ref 36.0–46.0)
Hemoglobin: 11.2 g/dL — ABNORMAL LOW (ref 12.0–15.0)
Hemoglobin: 10.6 g/dL — ABNORMAL LOW (ref 12.0–15.0)
MCH: 30.4 pg (ref 26.0–34.0)
MCH: 29.8 pg (ref 26.0–34.0)
MCHC: 34.1 g/dL (ref 30.0–36.0)
MCHC: 33.4 g/dL (ref 30.0–36.0)
MCV: 89.1 fL (ref 78.0–100.0)
MCV: 89 fL (ref 78.0–100.0)
PLATELETS: 393 10*3/uL (ref 150–400)
Platelets: 386 10*3/uL (ref 150–400)
RBC: 3.68 MIL/uL — ABNORMAL LOW (ref 3.87–5.11)
RBC: 3.56 MIL/uL — ABNORMAL LOW (ref 3.87–5.11)
RDW: 12.3 % (ref 11.5–15.5)
RDW: 12.1 % (ref 11.5–15.5)
WBC: 6.2 10*3/uL (ref 4.0–10.5)
WBC: 6.4 10*3/uL (ref 4.0–10.5)

## 2014-01-24 LAB — LIPASE, BLOOD: Lipase: 23 U/L (ref 11–59)

## 2014-01-24 LAB — TROPONIN I: Troponin I: <0.3 ng/mL (ref <0.30)

## 2014-01-24 LAB — PRO B NATRIURETIC PEPTIDE: Pro B Natriuretic peptide (BNP): 13.8 pg/mL (ref 0–125)

## 2014-01-24 LAB — I-STAT CG4 LACTIC ACID, ED: Lactic Acid, Venous: 1.1 mmol/L (ref 0.5–2.2)

## 2014-01-24 MED ORDER — ENOXAPARIN SODIUM 30 MG/0.3ML ~~LOC~~ SOLN
30.0000 mg | Freq: Every day | SUBCUTANEOUS | Status: DC
  Administered 2014-01-24 – 2014-02-02 (×10): 30 mg via SUBCUTANEOUS
  Filled 2014-01-24 (×11): qty 0.3

## 2014-01-24 MED ORDER — DEXTROSE-NACL 5-0.45 % IV SOLN: INTRAVENOUS | Status: DC

## 2014-01-24 MED ORDER — POTASSIUM CHLORIDE 10 MEQ/100ML IV SOLN: 10.0000 meq | INTRAVENOUS | Status: DC

## 2014-01-24 MED ORDER — IOHEXOL 300 MG/ML  SOLN
20.0000 mL | INTRAMUSCULAR | Status: AC
  Administered 2014-01-24: 50 mL via ORAL

## 2014-01-24 MED ORDER — SODIUM CHLORIDE 0.9 % IV BOLUS (SEPSIS)
1000.0000 mL | Freq: Once | INTRAVENOUS | Status: AC
  Administered 2014-01-24: 1000 mL via INTRAVENOUS

## 2014-01-24 MED ORDER — SODIUM CHLORIDE 0.9 % IV SOLN
INTRAVENOUS | Status: DC
  Administered 2014-01-24 – 2014-01-29 (×7): via INTRAVENOUS
  Administered 2014-01-30 – 2014-01-31 (×2): 100 mL/h via INTRAVENOUS
  Administered 2014-01-31 – 2014-02-02 (×4): via INTRAVENOUS

## 2014-01-24 MED ORDER — SODIUM CHLORIDE 0.9 % IV SOLN
INTRAVENOUS | Status: DC
  Filled 2014-01-24: qty 1

## 2014-01-24 MED ORDER — INSULIN ASPART 100 UNIT/ML ~~LOC~~ SOLN
0.0000 [IU] | SUBCUTANEOUS | Status: DC
  Administered 2014-01-25 (×2): 3 [IU] via SUBCUTANEOUS

## 2014-01-24 MED ORDER — ONDANSETRON HCL 4 MG/2ML IJ SOLN
4.0000 mg | Freq: Once | INTRAMUSCULAR | Status: AC
  Administered 2014-01-24: 4 mg via INTRAVENOUS
  Filled 2014-01-24: qty 2

## 2014-01-24 MED ORDER — ONDANSETRON HCL 4 MG/2ML IJ SOLN
4.0000 mg | Freq: Four times a day (QID) | INTRAMUSCULAR | Status: DC | PRN
  Administered 2014-01-24 – 2014-02-03 (×16): 4 mg via INTRAVENOUS
  Filled 2014-01-24 (×17): qty 2

## 2014-01-24 MED ORDER — SODIUM CHLORIDE 0.9 % IV SOLN: INTRAVENOUS | Status: DC

## 2014-01-24 MED ORDER — SODIUM CHLORIDE 0.9 % IV SOLN
25.0000 mg | Freq: Once | INTRAVENOUS | Status: AC
  Administered 2014-01-24: 25 mg via INTRAVENOUS
  Filled 2014-01-24: qty 1

## 2014-01-24 MED ORDER — LEVOFLOXACIN IN D5W 500 MG/100ML IV SOLN
500.0000 mg | INTRAVENOUS | Status: DC
  Administered 2014-01-24 – 2014-02-05 (×13): 500 mg via INTRAVENOUS
  Filled 2014-01-24 (×16): qty 100

## 2014-01-24 MED ORDER — HYDRALAZINE HCL 20 MG/ML IJ SOLN: 5.0000 mg | INTRAMUSCULAR | Status: DC | PRN

## 2014-01-24 MED ORDER — SODIUM CHLORIDE 0.9 % IV SOLN
INTRAVENOUS | Status: AC
  Administered 2014-01-24 (×2): via INTRAVENOUS

## 2014-01-24 MED ORDER — LABETALOL HCL 100 MG PO TABS
100.0000 mg | ORAL_TABLET | Freq: Two times a day (BID) | ORAL | Status: DC
  Administered 2014-01-24 – 2014-02-03 (×17): 100 mg via ORAL
  Filled 2014-01-24 (×21): qty 1

## 2014-01-24 MED ORDER — DEXTROSE 50 % IV SOLN: 25.0000 mL | INTRAVENOUS | Status: DC | PRN

## 2014-01-24 NOTE — ED Notes (Signed)
Took pt bedside commode for UA sample. Pt states she is unable to void at present time

## 2014-01-24 NOTE — Progress Notes (Signed)
Received report on patient from ED Nurse. Pending status of when patient will be brought to the floor due to her still drinking contrast and going to CT. ED Nurse stated she will keep me informed of her arrival time to the unit.   [address]yanne Hill BSN , RN3 [facility] [place] [phone]  (late entry)

## 2014-01-24 NOTE — ED Notes (Signed)
Pt states she began antibiotics yesterday fro foot infection.  Has had nausea and vomiting since taking the first one but much worse today.  States she took a laxative this am that did not help.  Now has diarhhea

## 2014-01-24 NOTE — ED Provider Notes (Signed)
CSN: [id number]     Arrival date & time 01/23/2014  1536 History   First MD Initiated Contact with Patient 01/27/2014 1541     Chief Complaint  Patient presents with  . Emesis  . Diarrhea     (Consider location/radiation/quality/duration/timing/severity/associated sxs/prior Treatment) Patient is a 53 y.o. female presenting with vomiting and diarrhea. The history is provided by the patient.  Emesis Severity:  Moderate Duration:  2 days Timing:  Constant Quality:  Stomach contents Progression:  Worsening Chronicity:  New Recent urination:  Normal Relieved by:  Nothing Worsened by:  Nothing tried Associated symptoms: abdominal pain (aching), diarrhea, headaches, myalgias and URI (mild rhinorrhea)   Associated symptoms: no chills, no cough, no fever and no sore throat   Diarrhea Associated symptoms: abdominal pain (aching), headaches, myalgias, URI (mild rhinorrhea) and vomiting   Associated symptoms: no chills, no recent cough and no fever     Past Medical History  Diagnosis Date  . Diabetes mellitus   . Ulcer of lower limb, unspecified     ulcer on top of foot (right)  . Slow transit constipation   . Disorder of bone and cartilage, unspecified   . Unspecified essential hypertension   . Orthostatic hypotension   . Reflux esophagitis   . Gastroparesis   . Irritable bowel syndrome   . Flatulence, eructation, and gas pain   . Complication of anesthesia   . PONV (postoperative nausea and vomiting)    Past Surgical History  Procedure Laterality Date  . Esophagogastroduodenoscopy  08/11/2012    Procedure: ESOPHAGOGASTRODUODENOSCOPY (EGD);  Surgeon: Shirley Friar, MD;  Location: [facility];  Service: Endoscopy;  Laterality: N/A;  . Tubal ligation    . Toe amputation Left 11/09/2013    partial amputation of great left toe   . Amputation Right 11/07/2013    Procedure: AMPUTATION DIGIT - Right great toe;  Surgeon: Cheral Almas, MD;  Location: [facility] OR;  Service:  Orthopedics;  Laterality: Right;  . Amputation Left 11/09/2013    Procedure: AMPUTATION DIGIT - left great toe;  Surgeon: Cheral Almas, MD;  Location: [facility] OR;  Service: Orthopedics;  Laterality: Left;  . Amputation Right 01/13/2014    Procedure: AMPUTATION RIGHT FIFTH TOE AND RIGHT SECOND TOE;  Surgeon: Valeria Batman, MD;  Location[facility];  Service: Orthopedics;  Laterality: Right;   Family History  Problem Relation Age of Onset  . Diabetes Mother   . Alzheimer's disease Father    History  Substance Use Topics  . Smoking status: Never Smoker   . Smokeless tobacco: Never Used  . Alcohol Use: No   OB History   Grav Para Term Preterm Abortions TAB SAB Ect Mult Living                 Review of Systems  Constitutional: Negative for fever and chills.  HENT: Positive for rhinorrhea. Negative for sore throat.   Respiratory: Negative for cough and shortness of breath.   Gastrointestinal: Positive for nausea, vomiting, abdominal pain (aching) and diarrhea.  Musculoskeletal: Positive for myalgias.  Neurological: Positive for headaches.  All other systems reviewed and are negative.      Allergies  Review of patient's allergies indicates no known allergies.  Home Medications   Current Outpatient Rx  Name  Route  Sig  Dispense  Refill  . amoxicillin (AMOXIL) 500 MG tablet   Oral   Take 1 tablet (500 mg total) by mouth 3 (three) times daily.   21  tablet   0   . insulin aspart (NOVOLOG) 100 UNIT/ML injection   Subcutaneous   Inject 3-15 Units into the skin 3 (three) times daily before meals. *per sliding scale*         . insulin glargine (LANTUS) 100 UNIT/ML injection   Subcutaneous   Inject 10 Units into the skin at bedtime.         Marland Kitchen. oxyCODONE-acetaminophen (PERCOCET/ROXICET) 5-325 MG per tablet   Oral   Take 1-2 tablets by mouth every 4 (four) hours as needed for severe pain.   50 tablet   0    BP 117/66  Pulse 120  Temp(Src) 99 F (37.2 C) (Oral)   Resp 18  SpO2 93%  LMP 02/09/2011 Physical Exam  Nursing note and vitals reviewed. Constitutional: She is oriented to person, place, and time. She appears well-developed and well-nourished. No distress.  HENT:  Head: Normocephalic and atraumatic.  Eyes: EOM are normal. Pupils are equal, round, and reactive to light.  Neck: Normal range of motion. Neck supple.  Cardiovascular: Normal rate and regular rhythm.  Exam reveals no friction rub.   No murmur heard. Pulmonary/Chest: Effort normal and breath sounds normal. No respiratory distress. She has no wheezes. She has no rales.  Abdominal: Soft. She exhibits no distension. There is tenderness (moderate, diffuse). There is no rebound.  Musculoskeletal: Normal range of motion. She exhibits no edema.  Neurological: She is alert and oriented to person, place, and time. No cranial nerve deficit. She exhibits normal muscle tone. Coordination normal.  Skin: No rash noted. She is not diaphoretic.    ED Course  Procedures (including critical care time) Labs Review Labs Reviewed  CBC  COMPREHENSIVE METABOLIC PANEL  URINALYSIS, ROUTINE W REFLEX MICROSCOPIC  LIPASE, BLOOD  I-STAT CG4 LACTIC ACID, ED   Imaging Review Ct Abdomen Pelvis Wo Contrast  02/04/2014   CLINICAL DATA:  Nausea, vomiting and diarrhea. Renal insufficiency. Unable to drink oral contrast.  EXAM: CT ABDOMEN AND PELVIS WITHOUT CONTRAST  TECHNIQUE: Multidetector CT imaging of the abdomen and pelvis was performed following the standard protocol without intravenous contrast.  COMPARISON:  12/18/2013.  FINDINGS: There is some high density ingested material on the stomach. No gastrointestinal abnormalities or enlarged lymph nodes are seen. No evidence of appendicitis. There is some medium density material in the dependent portions of the gallbladder. Unremarkable non contrasted appearance of the liver, spleen, pancreas, adrenal glands, kidneys and urinary bladder. 3.8 cm right uterine mass  with coarse calcifications, compatible with a degenerated fibroid. Minimal atelectasis or scarring at the lung bases. Minimal lower thoracic spine degenerative changes.  IMPRESSION: No acute abnormality.   Electronically Signed   By: Gordan PaymentSteve  Reid M.D.   On: 01/25/2014 20:36     EKG Interpretation   Date/Time:  Saturday January 24 2014 20:30:46 EDT Ventricular Rate:  98 PR Interval:  175 QRS Duration: 79 QT Interval:  340 QTC Calculation: 434 R Axis:   56 Text Interpretation:  Sinus rhythm Similar to prior Confirmed by Gwendolyn GrantWALDEN   MD, Vendetta Pittinger ([id number]) on 01/18/2014 8:43:18 PM      MDM   Final diagnoses:  Acute renal failure  Vomiting and diarrhea    5F presents with nausea, vomiting, diarrhea, body aches. Has mild rhinorrhea also. Present for 2 days, worsening. Recent R fifth toe amputation and R 2nd toe distal tip amputation. Currently on amoxicillin and levaquin. Moderate abdominal tenderness, no focal rebound or guarding. Patient's toes appear well, no signs of infection or  drainage.  Will start with labs. Labs show elevated creatinine. Admitted to medicine. Medicine requested CT, read as negative.   Dagmar Hait, MD 01/25/2014 [phone]

## 2014-01-24 NOTE — H&P (Signed)
[facility] History and Physical  Oren Beckmannammy C Ayad EAV:[id number] DOB: 1960/11/18 DOA: Oct 14, 2014  Referring physician:  PCP: Oneal GroutPANDEY, MAHIMA, MD  Specialists:   Chief Complaint: nausea, vomiting, diarrhea, body aches   HPI: Jennifer [name] is a 53 y.o. 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 S/P Ray amputation of right little toe andTerminal Syme amputation, right second toe 01/13/2014 by Dr. Norlene CampbellPeter Whitfield (orthopedic surgery). [facility]  with 2 days nausea, vomiting, diarrhea, chills/shakes, body aches. Has mild rhinorrhea also. Recent R fifth toe amputation and R 2nd toe distal tip amputation. Currently levaquin which Dr. Norlene CampbellPeter Whitfield started on 3/13 and patient was to remain on for 30 days. Moderate abdominal tenderness, no focal rebound or guarding. Patient's toes appear well, no signs of infection or drainage.  Admission AG= 16. Currently positive nausea, positive lightheadedness, positive abdominal tenderness, negative chills/shakes    Review of Systems: The patient denies anorexia, fever, weight loss,, vision loss, decreased hearing, hoarseness, chest pain, syncope, dyspnea on exertion, peripheral edema, balance deficits, hemoptysis, melena, hematochezia, severe indigestion/heartburn, hematuria, incontinence, genital sores, muscle weakness, suspicious skin lesions, transient blindness, difficulty walking, depression, unusual weight change, abnormal bleeding, enlarged lymph nodes, angioedema, and breast masses.    TRAVEL HISTORY:  None   Procedure CT abdomen pelvis pending  Antibiotics Levofloxacin 500 mg daily 3/13>>  Consultation    Past Medical History  Diagnosis Date  . Diabetes mellitus   . Ulcer of lower limb, unspecified     ulcer on top of foot (right)  . Slow transit  constipation   . Disorder of bone and cartilage, unspecified   . Unspecified essential hypertension   . Orthostatic hypotension   . Reflux esophagitis   . Gastroparesis   . Irritable bowel syndrome   . Flatulence, eructation, and gas pain   . Complication of anesthesia   . PONV (postoperative nausea and vomiting)    Past Surgical History  Procedure Laterality Date  . Esophagogastroduodenoscopy  08/11/2012    Procedure: ESOPHAGOGASTRODUODENOSCOPY (EGD);  Surgeon: Shirley FriarVincent C. Schooler, MD;  [facility];  Service: Endoscopy;  Laterality: N/A;  . Tubal ligation    . Toe amputation Left 11/09/2013    partial amputation of great left toe   . Amputation Right 11/07/2013    Procedure: AMPUTATION DIGIT - Right great toe;  Surgeon: Cheral AlmasNaiping Michael Xu, MD;  [facility];  Service: Orthopedics;  Laterality: Right;  . Amputation Left 11/09/2013    Procedure: AMPUTATION DIGIT - left great toe;  Surgeon: Cheral AlmasNaiping Michael Xu, MD;  [facility];  Service: Orthopedics;  Laterality: Left;  . Amputation Right 01/13/2014    Procedure: AMPUTATION RIGHT FIFTH TOE AND RIGHT SECOND TOE;  Surgeon: Valeria BatmanPeter W Whitfield, MD;  [facility];  Service: Orthopedics;  Laterality: Right;   Social History:  reports that she has never smoked. She has never used smokeless tobacco. She reports that she does not drink alcohol or use illicit drugs. where does patient live--home, ALF, SNF? and with whom if at home?  Can patient participate in ADLs?  No Known Allergies  Family History  Problem Relation Age of Onset  . Diabetes Mother   . Alzheimer's disease Father      Prior to Admission medications   Medication Sig Start Date End Date Taking? Authorizing Provider  amoxicillin (AMOXIL) 500 MG tablet Take 1 tablet (500  mg total) by mouth 3 (three) times daily. 01/15/14  Yes Elease Etienne, MD  insulin aspart (NOVOLOG) 100 UNIT/ML injection Inject 3-15 Units into the skin 3 (three) times daily before meals.  *per sliding scale*   Yes Historical Provider, MD  insulin glargine (LANTUS) 100 UNIT/ML injection Inject 10 Units into the skin at bedtime.   Yes Historical Provider, MD  oxyCODONE-acetaminophen (PERCOCET/ROXICET) 5-325 MG per tablet Take 1-2 tablets by mouth every 4 (four) hours as needed for severe pain. 01/15/14  Yes Jacqualine Code, PA-C   Physical Exam: Filed Vitals:   02/23/2014 1800 02-23-14 1815 02/23/2014 1845 February 23, 2014 1900  BP: 157/80 183/98 179/92 168/89  Pulse: 95 99 98 95  Temp:      TempSrc:      Resp:      SpO2: 100% 99% 99% 100%     General:  A./O. x4, moderate distress secondary to nausea and abdominal pain  Eyes: Pupils equal reactive to light and accommodation  Neck: Negative JVD, negative lymphadenopathy  Cardiovascular: Tachycardia, regular rhythm, negative murmurs rubs or gallops  Respiratory: Clear to ossification bilateral  Abdomen: Diffuse abdominal tenderness to palpation, positive hypoactive bowel sounds  Skin: Negative lesions  Musculoskeletal: Right foot encompassed in a Una boot secondary to amputation metatarsals  Neurologic: Cranial nerves II through XII intact  Labs on Admission:  Basic Metabolic Panel:  Recent Labs Lab 2014-02-23 1639  NA 142  K 4.1  CL 100  CO2 26  GLUCOSE 166*  BUN 28*  CREATININE 2.22*  CALCIUM 10.3   Liver Function Tests:  Recent Labs Lab 02-23-2014 1639  AST 13  ALT 9  ALKPHOS 121*  BILITOT 0.4  PROT 8.1  ALBUMIN 3.7    Recent Labs Lab Feb 23, 2014 1639  LIPASE 23   No results found for this basename: AMMONIA,  in the last 168 hours CBC:  Recent Labs Lab 02/23/2014 1639  WBC 6.2  HGB 11.2*  HCT 32.8*  MCV 89.1  PLT 386   Cardiac Enzymes: No results found for this basename: CKTOTAL, CKMB, CKMBINDEX, TROPONINI,  in the last 168 hours  BNP (last 3 results) No results found for this basename: PROBNP,  in the last 8760 hours CBG: No results found for this basename: GLUCAP,  in the last 168  hours  Radiological Exams on Admission: No results found.  EKG: Pending  Assessment/Plan Active Problems:   Shock liver   Gastroparesis due to DM   Hypertension   Type II or unspecified type diabetes mellitus with unspecified complication, uncontrolled   Neuropathic diabetic ulcer of foot   History of amputation of lesser toe of right foot   Amputation of second toe, right, traumatic   Vomiting and diarrhea   Nausea  Nausea/vomiting/diarrhea -Patient has been on 2 weeks of antibiotics secondary to surgery on right foot. 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 -Aggressive fluid hydration -Moderate SSI (patient's last CBG= 166)  Uncontrolled diabetes Type 2 -See gastroparesis secondary to diabetes -Obtain lipid panel  Amputation of right fifth metatarsal/right second metatarsal -Will continue Dr. Regenia Skeeter (orthopedic surgery) antibiotic therapy for her amputation of metatarsals; Levaquin 500 mg IV daily; will DC if CT scan/C. difficile by PCR reveals colitis   HTN -Labetalol 100 mg  BID -Hydralazine 5 mg IV PRN SBP> 164 or DBP> 100  Acute renal failure -Obtain urine culture -Hydrate aggressively, most likely secondary to dehydration vs infection    Code Status: Full Family Communication: None available Disposition Plan: Resolution of enteritis  Time spent: 60 minutes Drema [place] [facility] Pager [phone]  If 7PM-7AM, please contact night-coverage [email or web address] Password [facility] 01/29/2014, 8:05 PM

## 2014-01-24 NOTE — ED Notes (Signed)
Pt presents to department for evaluation of nausea/vomiting and diarrhea. Recently started taking Amoxil and Levaquin for R foot infection. Now states she feels horrible. Actively vomiting in triage. She is conscious alert and oriented x4.

## 2014-01-25 ENCOUNTER — Inpatient Hospital Stay (HOSPITAL_COMMUNITY): Payer: BC Managed Care – PPO

## 2014-01-25 DIAGNOSIS — M869 Osteomyelitis, unspecified: Secondary | ICD-10-CM

## 2014-01-25 DIAGNOSIS — M9080 Osteopathy in diseases classified elsewhere, unspecified site: Secondary | ICD-10-CM

## 2014-01-25 DIAGNOSIS — R112 Nausea with vomiting, unspecified: Secondary | ICD-10-CM

## 2014-01-25 LAB — HEMOGLOBIN A1C
HEMOGLOBIN A1C: 7.2 % — AB (ref <5.7)
MEAN PLASMA GLUCOSE: 160 mg/dL — AB (ref <117)

## 2014-01-25 LAB — GLUCOSE, CAPILLARY
GLUCOSE-CAPILLARY: 200 mg/dL — AB (ref 70–99)
Glucose-Capillary: 109 mg/dL — ABNORMAL HIGH (ref 70–99)
Glucose-Capillary: 141 mg/dL — ABNORMAL HIGH (ref 70–99)
Glucose-Capillary: 153 mg/dL — ABNORMAL HIGH (ref 70–99)
Glucose-Capillary: 160 mg/dL — ABNORMAL HIGH (ref 70–99)
Glucose-Capillary: 177 mg/dL — ABNORMAL HIGH (ref 70–99)

## 2014-01-25 LAB — BASIC METABOLIC PANEL
BUN: 23 mg/dL (ref 6–23)
CALCIUM: 9 mg/dL (ref 8.4–10.5)
CO2: 22 meq/L (ref 19–32)
CREATININE: 1.96 mg/dL — AB (ref 0.50–1.10)
Chloride: 104 mEq/L (ref 96–112)
GFR calc Af Amer: 33 mL/min — ABNORMAL LOW (ref >90)
GFR calc non Af Amer: 29 mL/min — ABNORMAL LOW (ref >90)
GLUCOSE: 143 mg/dL — AB (ref 70–99)
Potassium: 3.8 mEq/L (ref 3.7–5.3)
SODIUM: 143 meq/L (ref 137–147)

## 2014-01-25 LAB — TROPONIN I: Troponin I: <0.3 ng/mL (ref <0.30)

## 2014-01-25 MED ORDER — METOCLOPRAMIDE HCL 5 MG/ML IJ SOLN
5.0000 mg | Freq: Three times a day (TID) | INTRAMUSCULAR | Status: DC | PRN
  Administered 2014-01-25: 5 mg via INTRAVENOUS
  Filled 2014-01-25: qty 2

## 2014-01-25 MED ORDER — METOCLOPRAMIDE HCL 5 MG/ML IJ SOLN
5.0000 mg | Freq: Three times a day (TID) | INTRAMUSCULAR | Status: DC | PRN
  Administered 2014-01-26 (×2): 10 mg via INTRAVENOUS
  Filled 2014-01-25 (×2): qty 2

## 2014-01-25 MED ORDER — LABETALOL HCL 5 MG/ML IV SOLN
10.0000 mg | INTRAVENOUS | Status: DC | PRN
  Administered 2014-01-26 (×2): 10 mg via INTRAVENOUS
  Filled 2014-01-25: qty 4

## 2014-01-25 MED ORDER — INSULIN ASPART 100 UNIT/ML ~~LOC~~ SOLN
0.0000 [IU] | SUBCUTANEOUS | Status: DC
  Administered 2014-01-25: 3 [IU] via SUBCUTANEOUS
  Administered 2014-01-25: 2 [IU] via SUBCUTANEOUS
  Administered 2014-01-26: 3 [IU] via SUBCUTANEOUS
  Administered 2014-01-26: 5 [IU] via SUBCUTANEOUS
  Administered 2014-01-26: 3 [IU] via SUBCUTANEOUS
  Administered 2014-01-26: 2 [IU] via SUBCUTANEOUS
  Administered 2014-01-27 (×2): 3 [IU] via SUBCUTANEOUS
  Administered 2014-01-27: 2 [IU] via SUBCUTANEOUS
  Administered 2014-01-27 (×2): 3 [IU] via SUBCUTANEOUS
  Administered 2014-01-28: 2 [IU] via SUBCUTANEOUS
  Administered 2014-01-28: 5 [IU] via SUBCUTANEOUS
  Administered 2014-01-28: 2 [IU] via SUBCUTANEOUS
  Administered 2014-01-28: 3 [IU] via SUBCUTANEOUS
  Administered 2014-01-28: 2 [IU] via SUBCUTANEOUS
  Administered 2014-01-28: 3 [IU] via SUBCUTANEOUS
  Administered 2014-01-29 (×2): 2 [IU] via SUBCUTANEOUS
  Administered 2014-01-29: 3 [IU] via SUBCUTANEOUS
  Administered 2014-01-30 (×2): 2 [IU] via SUBCUTANEOUS
  Administered 2014-01-30: 3 [IU] via SUBCUTANEOUS
  Administered 2014-01-30: 2 [IU] via SUBCUTANEOUS
  Administered 2014-01-30: 3 [IU] via SUBCUTANEOUS
  Administered 2014-01-31 (×4): 2 [IU] via SUBCUTANEOUS
  Administered 2014-02-01: 3 [IU] via SUBCUTANEOUS
  Administered 2014-02-01: 2 [IU] via SUBCUTANEOUS
  Administered 2014-02-01: 3 [IU] via SUBCUTANEOUS
  Administered 2014-02-01 – 2014-02-02 (×2): 2 [IU] via SUBCUTANEOUS
  Administered 2014-02-03: 3 [IU] via SUBCUTANEOUS
  Administered 2014-02-03: 11 [IU] via SUBCUTANEOUS
  Administered 2014-02-03: 3 [IU] via SUBCUTANEOUS

## 2014-01-25 NOTE — Progress Notes (Addendum)
[facility] PROGRESS NOTE  LEONELA KIVI ZOX:[id number] DOB: 1961-04-30 DOA: 02/10/2014 PCP: [name], MD  Assessment/Plan: 1. Intractable nausea vomiting. Given her history of poorly controlled diabetes mellitus with multiple complications, it is conceivable this may be secondary to diabetic after presyncope she had a CT scan of abdomen and pelvis performed on admission which showed no acute intra-abdominal pathology. Given ongoing nausea and vomiting despite IV antibiotic therapy, I have ordered an NG tube for decompression. Continue n.p.o. status, IV fluid resuscitation, supportive care. 2. Probable gastroparesis in setting of uncontrolled diabetes mellitus. NG tube to be placed for decompression. Provide IV fluids, start Reglan 5 mg IV 3 times a day. 3. Acute Kidney Failure. Likely secondary to prerenal azotemia stemming from repeated episodes, continue IV fluids 4. Type 2 diabetes mellitus, poorly controlled. We'll continue Accu-Cheks every 4 hours. Provide sliding scale insulin. 5. Osteomyelitis, status post amputation of right fifth metatarsal, right second metatarsal, performed on recent hospitalization. We'll continue Levaquin 500 milligrams IV daily as she was unable to tolerate by mouth at this time. 6. Diarrhea. Stool for C. difficile has been sent, pending at time of this dictation. 7. Hypertension. Patient unable to tolerate by mouth at this time, will provide IV labetalol  Code Status: Full Code Family Communication:  Disposition Plan: Continue supportive care, place NG tube, follow up on KUB   Consultants:  Diabetic Coordinator  Procedures:    Antibiotics:  Levaquin  HPI/Subjective: Patient is a pleasant 53 year old with a past medical history of type II uncontrolled diabetes mellitus complicated by multiple amputations, diabetic foot ulcer, diabetic gastroparesis, hypertension, diabetic neuropathy, was recently discharged from the medicine service on  01/15/2014 time she was diagnosed with osteomyelitis undergoing amputation of second right toe. She was discharged on amoxicillin 500 mg by mouth 3 times a day. She presented to the emergency room on 01/24/2049 with complaints of nausea vomiting and diarrhea. She had a CT scan of abdomen and pelvis without contrast performed on admission which showed no acute abnormality. Patient was made n.p.o., and administered IV fluids. 3/15/2015The following morning she reported persistent nausea vomiting, despite receiving antibiotic therapy. An NG tube was placed with KUB ordered for confirmation.  Objective: Filed Vitals:   01/25/14 1250  BP: 184/80  Pulse: 84  Temp: 98.6 F (37 C)  Resp: 16    Intake/Output Summary (Last 24 hours) at 01/25/14 1406 Last data filed at 01/25/14 0943  Gross per 24 hour  Intake   1495 ml  Output      0 ml  Net   1495 ml   Filed Weights   02/05/2014 2100  Weight: 63.6 kg (140 lb 3.4 oz)    Exam:   General:  Patient in mild distress, appears uncomfortable, having multiple episodes of N/V prior to my encounter  Cardiovascular: Tachycardic, RRR, NL S1S2  Respiratory: Clear to auscultation, no wheezing rhonchi or rales  Abdomen: She has mild to mod generalized tenderness to palpation  Musculoskeletal: Right foot bandaged, no edem to extremities  Data Reviewed: Basic Metabolic Panel:  Recent Labs Lab 01/28/2014 1639 01/18/2014 1925 02/03/2014 2238 01/25/14 0634  NA 142 141 141 143  K 4.1 4.1 3.7 3.8  CL 100 100 103 104  CO2 26 23 22 22   GLUCOSE 166* 150* 184* 143*  BUN 28* 26* 26* 23  CREATININE 2.22* 2.00* 2.01* 1.96*  CALCIUM 10.3 9.5 8.8 9.0   Liver Function Tests:  Recent Labs Lab 01/11/2014 1639  AST 13  ALT 9  ALKPHOS 121*  BILITOT 0.4  PROT 8.1  ALBUMIN 3.7    Recent Labs Lab 01/20/2014 1639  LIPASE 23   No results found for this basename: AMMONIA,  in the last 168 hours CBC:  Recent Labs Lab 01/30/2014 1639 01/15/2014 1925  WBC  6.2 6.4  HGB 11.2* 10.6*  HCT 32.8* 31.7*  MCV 89.1 89.0  PLT 386 393   Cardiac Enzymes:  Recent Labs Lab 01/26/2014 1925 01/25/14 0634 01/25/14 0930  TROPONINI <0.30 <0.30 <0.30   BNP (last 3 results)  Recent Labs  01/23/2014 1925  PROBNP 13.8   CBG:  Recent Labs Lab 02/07/2014 2354 01/25/14 0435 01/25/14 0745 01/25/14 1217  GLUCAP 160* 109* 177* 200*    Recent Results (from the past 240 hour(s))  CULTURE, BLOOD (ROUTINE X 2)     Status: None   Collection Time    01/23/2014  7:25 PM      Result Value Ref Range Status   Specimen Description BLOOD LEFT HAND   Final   Special Requests BOTTLES DRAWN AEROBIC ONLY 6CC   Final   Culture  Setup Time     Final   Value: 02/01/2014 23:41     Performed at [facility]   Culture     Final   Value:        BLOOD CULTURE RECEIVED NO GROWTH TO DATE CULTURE WILL BE HELD FOR 5 DAYS BEFORE ISSUING A FINAL NEGATIVE REPORT     Performed at [facility]   Report Status PENDING   Incomplete  CULTURE, BLOOD (ROUTINE X 2)     Status: None   Collection Time    02/03/2014  7:40 PM      Result Value Ref Range Status   Specimen Description BLOOD RIGHT ANTECUBITAL   Final   Special Requests BOTTLES DRAWN AEROBIC ONLY 9CC   Final   Culture  Setup Time     Final   Value: 01/13/2014 23:41     Performed at [facility]   Culture     Final   Value:        BLOOD CULTURE RECEIVED NO GROWTH TO DATE CULTURE WILL BE HELD FOR 5 DAYS BEFORE ISSUING A FINAL NEGATIVE REPORT     Performed at [facility]   Report Status PENDING   Incomplete     Studies: Ct Abdomen Pelvis Wo Contrast  01/13/2014   CLINICAL DATA:  Nausea, vomiting and diarrhea. Renal insufficiency. Unable to drink oral contrast.  EXAM: CT ABDOMEN AND PELVIS WITHOUT CONTRAST  TECHNIQUE: Multidetector CT imaging of the abdomen and pelvis was performed following the standard protocol without intravenous contrast.  COMPARISON:  12/18/2013.  FINDINGS: There is some  high density ingested material on the stomach. No gastrointestinal abnormalities or enlarged lymph nodes are seen. No evidence of appendicitis. There is some medium density material in the dependent portions of the gallbladder. Unremarkable non contrasted appearance of the liver, spleen, pancreas, adrenal glands, kidneys and urinary bladder. 3.8 cm right uterine mass with coarse calcifications, compatible with a degenerated fibroid. Minimal atelectasis or scarring at the lung bases. Minimal lower thoracic spine degenerative changes.  IMPRESSION: No acute abnormality.   Electronically Signed   By: Gordan PaymentSteve  Reid M.D.   On: 01/25/2014 20:36    Scheduled Meds: . enoxaparin (LOVENOX) injection  30 mg Subcutaneous QHS  . insulin aspart  0-15 Units Subcutaneous 6 times per day  . labetalol  100 mg Oral BID  . levofloxacin (  LEVAQUIN) IV  500 mg Intravenous Q24H   Continuous Infusions: . sodium chloride 150 mL/hr at 01/25/14 1610    Active Problems:   Shock liver   Gastroparesis due to DM   Hypertension   Type II or unspecified type diabetes mellitus with unspecified complication, uncontrolled   Neuropathic diabetic ulcer of foot   History of amputation of lesser toe of right foot   Amputation of second toe, right, traumatic   Vomiting and diarrhea   Nausea   Acute renal failure    Time spent: 35 min    Jeralyn Bennett  [facility] Pager [phone]. If 7PM-7AM, please contact night-coverage at [email or web address], password [facility] 01/25/2014, 2:06 PM  LOS: 1 day

## 2014-01-25 NOTE — Progress Notes (Signed)
Attempted x2 insertion of NG tube per MD order, patient did not tolerate well, began gagging, screaming and crying.  Patient is now refusing.  MD to be notified.

## 2014-01-25 NOTE — Progress Notes (Signed)
Patient continued to refuse NG tube placement.  MD aware.  Increase of PRN Reglan to 5-10mg  IV PRN for nausea.  Will keep NG tube placement order for possible future needs.  Patient has not had a bowel movement this shift so samples was not obtained.  Nsg to continue to monitor for status changes.

## 2014-01-25 NOTE — Progress Notes (Signed)
New Admission Note: (late entry)   Arrival Method: Via stretcher from the ED with EMT Karl Balesharlie Grant  Mental Orientation: Alert and Oriented X4 Telemetry: Placed on box 6E01 Assessment: Completed Skin: Left great toe amp, left second toe diabetic ulcer. Right great toe amp, right second toe and pinky amp with stitches wrapped in guaze and ace bandage. Unable to view stitches, patient stated suture site.  IV: Clean, dry and intact. NS @ 13050mL/hr Pain: No pain at this time, just nausea Tubes: None  Safety Measures: Safety Fall Prevention Plan has been given, discussed and signed Admission: Completed [place] Orientation: Patient has been orientated to the room, unit and staff.  Family: No family present at this time  Orders have been reviewed and implemented. Will continue to monitor the patient. Call light has been placed within reach for the patient.    Doristine Devoidyanne Hill BSN, RN 3 Phone number: [phone]  [facility] [place]

## 2014-01-26 LAB — CBC
HEMATOCRIT: 30.4 % — AB (ref 36.0–46.0)
Hemoglobin: 10.4 g/dL — ABNORMAL LOW (ref 12.0–15.0)
MCH: 30.5 pg (ref 26.0–34.0)
MCHC: 34.2 g/dL (ref 30.0–36.0)
MCV: 89.1 fL (ref 78.0–100.0)
Platelets: 372 10*3/uL (ref 150–400)
RBC: 3.41 MIL/uL — ABNORMAL LOW (ref 3.87–5.11)
RDW: 12.2 % (ref 11.5–15.5)
WBC: 8.7 10*3/uL (ref 4.0–10.5)

## 2014-01-26 LAB — BASIC METABOLIC PANEL
BUN: 18 mg/dL (ref 6–23)
CHLORIDE: 102 meq/L (ref 96–112)
CO2: 25 mEq/L (ref 19–32)
CREATININE: 1.62 mg/dL — AB (ref 0.50–1.10)
Calcium: 9.7 mg/dL (ref 8.4–10.5)
GFR, EST AFRICAN AMERICAN: 42 mL/min — AB (ref >90)
GFR, EST NON AFRICAN AMERICAN: 36 mL/min — AB (ref >90)
GLUCOSE: 169 mg/dL — AB (ref 70–99)
POTASSIUM: 3.5 meq/L — AB (ref 3.7–5.3)
Sodium: 143 mEq/L (ref 137–147)

## 2014-01-26 LAB — OCCULT BLOOD, POC DEVICE: Fecal Occult Bld: NEGATIVE

## 2014-01-26 LAB — GLUCOSE, CAPILLARY
GLUCOSE-CAPILLARY: 142 mg/dL — AB (ref 70–99)
GLUCOSE-CAPILLARY: 227 mg/dL — AB (ref 70–99)
Glucose-Capillary: 114 mg/dL — ABNORMAL HIGH (ref 70–99)
Glucose-Capillary: 133 mg/dL — ABNORMAL HIGH (ref 70–99)
Glucose-Capillary: 173 mg/dL — ABNORMAL HIGH (ref 70–99)
Glucose-Capillary: 183 mg/dL — ABNORMAL HIGH (ref 70–99)

## 2014-01-26 MED ORDER — ISOSORBIDE MONONITRATE ER 30 MG PO TB24
30.0000 mg | ORAL_TABLET | Freq: Every day | ORAL | Status: DC
  Administered 2014-01-26 – 2014-01-27 (×2): 30 mg via ORAL
  Filled 2014-01-26 (×2): qty 1

## 2014-01-26 MED ORDER — METOCLOPRAMIDE HCL 10 MG PO TABS
10.0000 mg | ORAL_TABLET | Freq: Three times a day (TID) | ORAL | Status: DC
  Administered 2014-01-27: 10 mg via ORAL
  Filled 2014-01-26 (×4): qty 1

## 2014-01-26 MED ORDER — AMLODIPINE BESYLATE 5 MG PO TABS
5.0000 mg | ORAL_TABLET | Freq: Every day | ORAL | Status: DC
  Administered 2014-01-26 – 2014-01-27 (×2): 5 mg via ORAL
  Filled 2014-01-26 (×2): qty 1

## 2014-01-26 NOTE — Progress Notes (Signed)
[facility] PROGRESS NOTE  Jennifer [name] ZOX:[id number] DOB: 09-Feb-1961 DOA: 05/21/2014 PCP: Oneal GroutPANDEY, MAHIMA, MD  Assessment/Plan: 1. Intractable nausea vomiting. Given her history of poorly controlled diabetes mellitus with multiple complications, I suspect secondary to diabetic gastroparesis. After initiating scheduled Reglan it seems she has had some improvement. Today her diet was advanced to full liquids. 2. Probable gastroparesis in setting of uncontrolled diabetes mellitus. Although I had initially planned on placing  NG tube, it seems she improved with IV Reglan and expressed desire to not have NG tube placed.  3. Acute Kidney Failure. Likely secondary to prerenal azotemia stemming from repeated episodes, continue IV fluids. A.m. lab work showed improvement her creatinine, today at 1.62, improved from 1.96 on 01/25/2014 4. Type 2 diabetes mellitus, poorly controlled. We'll continue Accu-Cheks every 4 hours. Provide sliding scale insulin. 5. Osteomyelitis, status post amputation of right fifth metatarsal, right second metatarsal, performed on recent hospitalization. Continue Levaquin 6. Diarrhea. Stool for C. difficile has been sent, pending at time of this dictation. 7. Hypertension. Patient unable to tolerate by mouth at this time, will provide IV labetalol  Code Status: Full Code Family Communication:  Disposition Plan: Continue supportive care, place NG tube, follow up on KUB   Consultants:  Diabetic Coordinator  Procedures:    Antibiotics:  Levaquin  HPI/Subjective: Patient is a pleasant 53 year old with a past medical history of type II uncontrolled diabetes mellitus complicated by multiple amputations, diabetic foot ulcer, diabetic gastroparesis, hypertension, diabetic neuropathy, was recently discharged from the medicine service on 01/15/2014 time she was diagnosed with osteomyelitis undergoing amputation of second right toe. She was discharged on amoxicillin 500  mg by mouth 3 times a day. She presented to the emergency room on 01/24/2049 with complaints of nausea vomiting and diarrhea. She had a CT scan of abdomen and pelvis without contrast performed on admission which showed no acute abnormality. Patient was made n.p.o., and administered IV fluids. On 01/25/2014 she reported persistent nausea vomiting, despite receiving zofran therapy. Initially I had considered NG tube placement for gastric decompression, however patient did not want to undergo this procedure. As I suspected diabetic gastroparesis as the cause of her symptoms, Reglan therapy was initiated. She improved with this intervention as she was tolerating full liquids by 01/26/14.   Objective: Filed Vitals:   01/26/14 0900  BP: 189/90  Pulse: 95  Temp: 96.6 F (35.9 C)  Resp: 22    Intake/Output Summary (Last 24 hours) at 01/26/14 1456 Last data filed at 01/26/14 1300  Gross per 24 hour  Intake   1710 ml  Output   4200 ml  Net  -2490 ml   Filed Weights   May 05, 2014 2100 01/25/14 2230  Weight: 63.6 kg (140 lb 3.4 oz) 64 kg (141 lb 1.5 oz)    Exam:   General:  Patient in mild distress, appears uncomfortable, having multiple episodes of N/V prior to my encounter  Cardiovascular: Tachycardic, RRR, NL S1S2  Respiratory: Clear to auscultation, no wheezing rhonchi or rales  Abdomen: She has mild to mod generalized tenderness to palpation  Musculoskeletal: Right foot bandaged, no edem to extremities  Data Reviewed: Basic Metabolic Panel:  Recent Labs Lab May 05, 2014 1639 May 05, 2014 1925 May 05, 2014 2238 01/25/14 0634 01/26/14 0545  NA 142 141 141 143 143  K 4.1 4.1 3.7 3.8 3.5*  CL 100 100 103 104 102  CO2 26 23 22 22 25   GLUCOSE 166* 150* 184* 143* 169*  BUN 28* 26* 26* 23 18  CREATININE  2.22* 2.00* 2.01* 1.96* 1.62*  CALCIUM 10.3 9.5 8.8 9.0 9.7   Liver Function Tests:  Recent Labs Lab Feb 03, 2014 1639  AST 13  ALT 9  ALKPHOS 121*  BILITOT 0.4  PROT 8.1  ALBUMIN 3.7     Recent Labs Lab 02/03/14 1639  LIPASE 23   No results found for this basename: AMMONIA,  in the last 168 hours CBC:  Recent Labs Lab 2014-02-03 1639 02/03/2014 1925 01/26/14 0545  WBC 6.2 6.4 8.7  HGB 11.2* 10.6* 10.4*  HCT 32.8* 31.7* 30.4*  MCV 89.1 89.0 89.1  PLT 386 393 372   Cardiac Enzymes:  Recent Labs Lab 02-03-2014 1925 01/25/14 0634 01/25/14 0930  TROPONINI <0.30 <0.30 <0.30   BNP (last 3 results)  Recent Labs  02-03-2014 1925  PROBNP 13.8   CBG:  Recent Labs Lab 01/25/14 2000 01/26/14 0003 01/26/14 0410 01/26/14 0816 01/26/14 1206  GLUCAP 153* 133* 114* 183* 173*    Recent Results (from the past 240 hour(s))  CULTURE, BLOOD (ROUTINE X 2)     Status: None   Collection Time    Feb 03, 2014  7:25 PM      Result Value Ref Range Status   Specimen Description BLOOD LEFT HAND   Final   Special Requests BOTTLES DRAWN AEROBIC ONLY 6CC   Final   Culture  Setup Time     Final   Value: 2014/02/03 23:41     Performed at [facility]   Culture     Final   Value:        BLOOD CULTURE RECEIVED NO GROWTH TO DATE CULTURE WILL BE HELD FOR 5 DAYS BEFORE ISSUING A FINAL NEGATIVE REPORT     Performed at [facility]   Report Status PENDING   Incomplete  CULTURE, BLOOD (ROUTINE X 2)     Status: None   Collection Time    03-Feb-2014  7:40 PM      Result Value Ref Range Status   Specimen Description BLOOD RIGHT ANTECUBITAL   Final   Special Requests BOTTLES DRAWN AEROBIC ONLY 9CC   Final   Culture  Setup Time     Final   Value: 02/03/14 23:41     Performed at [facility]   Culture     Final   Value:        BLOOD CULTURE RECEIVED NO GROWTH TO DATE CULTURE WILL BE HELD FOR 5 DAYS BEFORE ISSUING A FINAL NEGATIVE REPORT     Performed at [facility]   Report Status PENDING   Incomplete  URINE CULTURE     Status: None   Collection Time    February 03, 2014  8:51 PM      Result Value Ref Range Status   Specimen Description URINE, CLEAN CATCH    Final   Special Requests NONE   Final   Culture  Setup Time     Final   Value: 2014-02-03 21:40     Performed at [facility] Count     Final   Value: >=100,000 COLONIES/ML     Performed at [facility]   Culture     Final   Value: GRAM NEGATIVE RODS     Performed at [facility]   Report Status PENDING   Incomplete     Studies: Ct Abdomen Pelvis Wo Contrast  02-03-2014   CLINICAL DATA:  Nausea, vomiting and diarrhea. Renal insufficiency. Unable to drink oral contrast.  EXAM:  CT ABDOMEN AND PELVIS WITHOUT CONTRAST  TECHNIQUE: Multidetector CT imaging of the abdomen and pelvis was performed following the standard protocol without intravenous contrast.  COMPARISON:  12/18/2013.  FINDINGS: There is some high density ingested material on the stomach. No gastrointestinal abnormalities or enlarged lymph nodes are seen. No evidence of appendicitis. There is some medium density material in the dependent portions of the gallbladder. Unremarkable non contrasted appearance of the liver, spleen, pancreas, adrenal glands, kidneys and urinary bladder. 3.8 cm right uterine mass with coarse calcifications, compatible with a degenerated fibroid. Minimal atelectasis or scarring at the lung bases. Minimal lower thoracic spine degenerative changes.  IMPRESSION: No acute abnormality.   Electronically Signed   By: Gordan Payment M.D.   On: 01/13/2014 20:36    Scheduled Meds: . amLODipine  5 mg Oral Daily  . enoxaparin (LOVENOX) injection  30 mg Subcutaneous QHS  . insulin aspart  0-15 Units Subcutaneous 6 times per day  . isosorbide mononitrate  30 mg Oral Daily  . labetalol  100 mg Oral BID  . levofloxacin (LEVAQUIN) IV  500 mg Intravenous Q24H   Continuous Infusions: . sodium chloride 100 mL/hr at 01/26/14 1323    Active Problems:   Shock liver   Gastroparesis due to DM   Hypertension   Type II or unspecified type diabetes mellitus with unspecified complication,  uncontrolled   Neuropathic diabetic ulcer of foot   History of amputation of lesser toe of right foot   Amputation of second toe, right, traumatic   Vomiting and diarrhea   Nausea   Acute renal failure    Time spent: 35 min    Jeralyn Bennett  [facility] Pager [phone]. If 7PM-7AM, please contact night-coverage at [email or web address], password [facility] 01/26/2014, 2:56 PM  LOS: 2 days

## 2014-01-26 NOTE — Progress Notes (Signed)
[facility]  Patient Status: Active (receiving services up to time of hospitalization)  [facility] is providing the following services: PT  If patient discharges after hours, please call [phone].   Jennifer BourgeoisMarie [name] 01/26/2014, 2:50 PM

## 2014-01-26 NOTE — Progress Notes (Signed)
Inpatient Diabetes Program Recommendations  AACE/ADA: New Consensus Statement on Inpatient Glycemic Control (2013)  Target Ranges:  Prepandial:   less than 140 mg/dL      Peak postprandial:   less than 180 mg/dL (1-2 hours)      Critically ill patients:  140 - 180 mg/dL   Reason for Visit: MD Referral-  Poorly controlled DM  Diabetes history: Type 2 DM with multiple hospitalizations and multiple various complications Outpatient Diabetes medications: Lantus 10 units at HS, Novolog 3-15 units tid with meals per sliding scale Current orders for Inpatient glycemic control: Moderate Novolog correction scale every 4 hours   Note:  Visited patient at bedside.  Note A1C has improved since 10/2013 value of 11.6 to current value of 7.2.  Patient states that she has been taking her insulin as prescribed-- has been tired of multiple hospitalizations and problems.  Had been "lazy" in the past regarding taking her insulin.  Praised her efforts.  Full liquids have been ordered.  When intake increases, may need addition of low-dose Lantus.  Otherwise, no recommendations. At discharge, needs encouragement to continue careful care of diabetes, adherence to insulin as prescribed, and regular f/u with PCP.  Thank you.  Aida Lemaire S. Elsie Lincolnouth, RN, CNS, CDE Inpatient Diabetes Program, team pager [phone]

## 2014-01-27 ENCOUNTER — Inpatient Hospital Stay (HOSPITAL_COMMUNITY): Payer: BC Managed Care – PPO

## 2014-01-27 DIAGNOSIS — R112 Nausea with vomiting, unspecified: Secondary | ICD-10-CM

## 2014-01-27 DIAGNOSIS — N179 Acute kidney failure, unspecified: Secondary | ICD-10-CM

## 2014-01-27 DIAGNOSIS — K3184 Gastroparesis: Secondary | ICD-10-CM

## 2014-01-27 DIAGNOSIS — E1149 Type 2 diabetes mellitus with other diabetic neurological complication: Secondary | ICD-10-CM

## 2014-01-27 DIAGNOSIS — R197 Diarrhea, unspecified: Secondary | ICD-10-CM

## 2014-01-27 LAB — BASIC METABOLIC PANEL
BUN: 16 mg/dL (ref 6–23)
CALCIUM: 9.6 mg/dL (ref 8.4–10.5)
CO2: 28 mEq/L (ref 19–32)
Chloride: 102 mEq/L (ref 96–112)
Creatinine, Ser: 1.44 mg/dL — ABNORMAL HIGH (ref 0.50–1.10)
GFR calc Af Amer: 48 mL/min — ABNORMAL LOW (ref >90)
GFR calc non Af Amer: 42 mL/min — ABNORMAL LOW (ref >90)
Glucose, Bld: 162 mg/dL — ABNORMAL HIGH (ref 70–99)
Potassium: 3.1 mEq/L — ABNORMAL LOW (ref 3.7–5.3)
SODIUM: 144 meq/L (ref 137–147)

## 2014-01-27 LAB — URINE CULTURE: Colony Count: >=100000

## 2014-01-27 LAB — GLUCOSE, CAPILLARY
GLUCOSE-CAPILLARY: 160 mg/dL — AB (ref 70–99)
Glucose-Capillary: 127 mg/dL — ABNORMAL HIGH (ref 70–99)
Glucose-Capillary: 154 mg/dL — ABNORMAL HIGH (ref 70–99)
Glucose-Capillary: 155 mg/dL — ABNORMAL HIGH (ref 70–99)
Glucose-Capillary: 173 mg/dL — ABNORMAL HIGH (ref 70–99)
Glucose-Capillary: 175 mg/dL — ABNORMAL HIGH (ref 70–99)

## 2014-01-27 MED ORDER — HYDRALAZINE HCL 20 MG/ML IJ SOLN: 10.0000 mg | INTRAMUSCULAR | Status: DC | PRN

## 2014-01-27 MED ORDER — AMLODIPINE BESYLATE 10 MG PO TABS
10.0000 mg | ORAL_TABLET | Freq: Every day | ORAL | Status: DC
  Administered 2014-01-28 – 2014-02-03 (×6): 10 mg via ORAL
  Filled 2014-01-27 (×7): qty 1

## 2014-01-27 MED ORDER — TRAMADOL HCL 50 MG PO TABS
50.0000 mg | ORAL_TABLET | Freq: Two times a day (BID) | ORAL | Status: DC | PRN
  Administered 2014-01-27: 50 mg via ORAL
  Filled 2014-01-27: qty 1

## 2014-01-27 MED ORDER — POTASSIUM CHLORIDE CRYS ER 20 MEQ PO TBCR
40.0000 meq | EXTENDED_RELEASE_TABLET | Freq: Once | ORAL | Status: AC
  Administered 2014-01-27: 40 meq via ORAL
  Filled 2014-01-27 (×2): qty 2

## 2014-01-27 MED ORDER — METOCLOPRAMIDE HCL 5 MG/ML IJ SOLN
10.0000 mg | Freq: Three times a day (TID) | INTRAMUSCULAR | Status: DC
  Administered 2014-01-27 – 2014-01-28 (×3): 10 mg via INTRAVENOUS
  Filled 2014-01-27 (×6): qty 2

## 2014-01-27 MED ORDER — POTASSIUM CHLORIDE 10 MEQ/100ML IV SOLN
10.0000 meq | INTRAVENOUS | Status: AC
  Administered 2014-01-27 (×2): 10 meq via INTRAVENOUS
  Filled 2014-01-27 (×2): qty 100

## 2014-01-27 MED ORDER — ISOSORBIDE MONONITRATE ER 60 MG PO TB24
60.0000 mg | ORAL_TABLET | Freq: Every day | ORAL | Status: DC
  Administered 2014-01-28 – 2014-02-03 (×6): 60 mg via ORAL
  Filled 2014-01-27 (×7): qty 1

## 2014-01-27 NOTE — Progress Notes (Signed)
Utilization review completed.  

## 2014-01-27 NOTE — Progress Notes (Addendum)
[facility] PROGRESS NOTE  Jennifer [name] RUE:[id number] DOB: [birth year] DOA:  PCP: [name], MD  Interim summary Patient is a pleasant 53 year old with a past medical history of type II uncontrolled diabetes mellitus complicated by multiple amputations, diabetic foot ulcer, diabetic gastroparesis, hypertension, diabetic neuropathy, was recently discharged from the medicine service on 01/15/2014 time she was diagnosed with osteomyelitis undergoing amputation of second right toe. She was discharged on amoxicillin 500 mg by mouth 3 times a day. She presented to the emergency room on 01/24/2049 with complaints of nausea vomiting and diarrhea. She had a CT scan of abdomen and pelvis without contrast performed on admission which showed no acute abnormality. Patient was made n.p.o., and administered IV fluids. On 01/25/2014 she reported persistent nausea vomiting, despite receiving zofran therapy. Initially I had considered NG tube placement for gastric decompression, however patient did not want to undergo this procedure. As I suspected diabetic gastroparesis as the cause of her symptoms, Reglan therapy was initiated. She improved with this intervention however continued to have episodes of nausea and vomiting. On 01/27/2014 I ordered a right upper quadrant ultrasound to assess for other potential causes of her nausea and vomiting.  Plan: I think N/V related to gastroparesis. RUQ [place] ordered to assess for other potential causes. Meanwhile will continue IV reglan, IV fluids, supportive care.    Assessment/Plan: 1. Intractable nausea vomiting. Patient continued to have nausea vomiting. Given her history of poorly controlled diabetes mellitus with multiple complications, I suspect secondary to diabetic gastroparesis, for which all contain units schedule Reglan 10 mg IV 3 times a day. Will assess for other possible causes, obtain a right upper quadrant ultrasound today. 2. Probable  gastroparesis in setting of uncontrolled diabetes mellitus. I recommended a NG-tube placement for decompression, however patient refused this intervention expressing wishes to continue with antibiotic therapy. I am ordering a Mediport ultrasound today to assess for other causes, meanwhile continue schedule Reglan 10 mg IV 3 times a day  3. Acute Kidney Failure. Likely secondary to prerenal azotemia stemming from repeated episodes, continue IV fluids. A.m. lab work showed improvement her creatinine at 1.44. Continue IV fluids 4. Type 2 diabetes mellitus, poorly controlled. We'll continue Accu-Cheks every 4 hours. Provide sliding scale insulin. 5. Osteomyelitis, status post amputation of right fifth metatarsal, right second metatarsal, performed on recent hospitalization. Continue Levaquin. I examined her foot today and consulted wound care. Wound appears clean, no evidence of bleed or infection. 6. Diarrhea. No further episodes of diarrhea since admission 7. Hypertension. Continue with when necessary hydralazine. Increasing clonidine dose 8. Hypokalemia. Likely secondary to repeated episodes of nausea vomiting, receiving potassium supplementation today   Code Status: Full Code Family Communication:  Disposition Plan: Continue supportive care, place NG tube, follow up on KUB   Consultants:  Diabetic Coordinator  Wound care  Procedures:    Antibiotics:  Levaquin  HPI/Subjective:   Objective: Filed Vitals:   01/27/14 0900  BP: 167/79  Pulse: 88  Temp: 98.6 F (37 C)  Resp: 19    Intake/Output Summary (Last 24 hours) at 01/27/14 1418 Last data filed at 01/27/14 0900  Gross per 24 hour  Intake    260 ml  Output      0 ml  Net    260 ml   Filed Weights   01/22/2014 2100 01/25/14 2230  Weight: 63.6 kg (140 lb 3.4 oz) 64 kg (141 lb 1.5 oz)    Exam:   General:  Patient appears comfortable, reports some  improvement to N/V  Cardiovascular: Tachycardic, RRR, NL  S1S2  Respiratory: Clear to auscultation, no wheezing rhonchi or rales  Abdomen: She has mild to mod generalized tenderness to palpation  Musculoskeletal: Right foot bandaged, no edem to extremities  Data Reviewed: Basic Metabolic Panel:  Recent Labs Lab 03/25/2014 1925 03/25/2014 2238 01/25/14 0634 01/26/14 0545 01/27/14 0558  NA 141 141 143 143 144  K 4.1 3.7 3.8 3.5* 3.1*  CL 100 103 104 102 102  CO2 23 22 22 25 28   GLUCOSE 150* 184* 143* 169* 162*  BUN 26* 26* 23 18 16   CREATININE 2.00* 2.01* 1.96* 1.62* 1.44*  CALCIUM 9.5 8.8 9.0 9.7 9.6   Liver Function Tests:  Recent Labs Lab 03/25/2014 1639  AST 13  ALT 9  ALKPHOS 121*  BILITOT 0.4  PROT 8.1  ALBUMIN 3.7    Recent Labs Lab 03/25/2014 1639  LIPASE 23   No results found for this basename: AMMONIA,  in the last 168 hours CBC:  Recent Labs Lab 03/25/2014 1639 03/25/2014 1925 01/26/14 0545  WBC 6.2 6.4 8.7  HGB 11.2* 10.6* 10.4*  HCT 32.8* 31.7* 30.4*  MCV 89.1 89.0 89.1  PLT 386 393 372   Cardiac Enzymes:  Recent Labs Lab 03/25/2014 1925 01/25/14 0634 01/25/14 0930  TROPONINI <0.30 <0.30 <0.30   BNP (last 3 results)  Recent Labs  03/25/2014 1925  PROBNP 13.8   CBG:  Recent Labs Lab 01/26/14 2039 01/27/14 0015 01/27/14 0422 01/27/14 0752 01/27/14 1138  GLUCAP 227* 175* 160* 154* 155*    Recent Results (from the past 240 hour(s))  CULTURE, BLOOD (ROUTINE X 2)     Status: None   Collection Time    03/25/2014  7:25 PM      Result Value Ref Range Status   Specimen Description BLOOD LEFT HAND   Final   Special Requests BOTTLES DRAWN AEROBIC ONLY 6CC   Final   Culture  Setup Time     Final   Value: 01/06/14 23:41     [facility]   Culture     Final   Value:        BLOOD CULTURE RECEIVED NO GROWTH TO DATE CULTURE WILL BE HELD FOR 5 DAYS BEFORE ISSUING A FINAL NEGATIVE REPORT     [facility]formed at [facility]   Report Status PENDING   Incomplete  CULTURE, BLOOD  (ROUTINE X 2)     Status: None   Collection Time    03/25/2014  7:40 PM      Result Value Ref Range Status   Specimen Description BLOOD RIGHT ANTECUBITAL   Final   Special Requests BOTTLES DRAWN AEROBIC ONLY [facility]   Final   Culture  Setup Time     Final   Value: 01/06/14 23:41     Performed at [facility]   Culture     Final   Value:        BLOOD CULTURE RECEIVED NO GROWTH TO DATE CULTURE WILL BE HELD FOR 5 DAYS BEFORE ISSUING A FINAL NEGATIVE REPORT     [facility]formed at [facility]   Report Status PENDING   Incomplete  URINE CULTURE     Status: None   Collection Time    03/25/2014  8:51 PM      Result Value Ref Range Status   Specimen Description URINE, CLEAN CATCH   Final   Special Requests NONE   Final   Culture  Setup Time  Final   Value: 01/29/2014 21:40     Performed at [facility] Count     Final   Value: >=100,000 COLONIES/ML     Performed at [facility]   Culture     Final   Value: KLEBSIELLA OXYTOCA     Performed at [facility]   Report Status 01/27/2014 FINAL   Final   Organism ID, Bacteria KLEBSIELLA OXYTOCA   Final     Studies: No results found.  Scheduled Meds: . [START ON 01/28/2014] amLODipine  10 mg Oral Daily  . enoxaparin (LOVENOX) injection  30 mg Subcutaneous QHS  . insulin aspart  0-15 Units Subcutaneous 6 times per day  . [START ON 01/28/2014] isosorbide mononitrate  60 mg Oral Daily  . labetalol  100 mg Oral BID  . levofloxacin (LEVAQUIN) IV  500 mg Intravenous Q24H  . metoCLOPramide (REGLAN) injection  10 mg Intravenous 3 times per day   Continuous Infusions: . sodium chloride 100 mL/hr at 01/27/14 1610    Active Problems:   Shock liver   Gastroparesis due to DM   Hypertension   Type II or unspecified type diabetes mellitus with unspecified complication, uncontrolled   Neuropathic diabetic ulcer of foot   History of amputation of lesser toe of right foot   Amputation of second toe, right,  traumatic   Vomiting and diarrhea   Nausea   Acute renal failure    Time spent: 35 min    Jeralyn Bennett  [facility] Pager [phone]. If 7PM-7AM, please contact night-coverage at [email or web address], password [facility] 01/27/2014, 2:18 PM  LOS: 3 days

## 2014-01-27 NOTE — Consult Note (Addendum)
WOC wound consult note Reason for Consult: Consult requested for right foot assessment.  Pt recently had amputation of toe on 3/5 for osteomyelitis by Dr Cleophas DunkerWhitfield.  Pt states had wound assessed in his office on Friday during her post-op appointment and is scheduled to return to his office this coming Friday.  She has been told to leave dressing intact until that time.   Wound type: Full thickness post-op incision to right outer foot and right second toe area.  Sutures intact and well approximated with dark black skin surrounding site.  No odor, drainage, or fluctuance.   Dressing procedure/placement/frequency: Continue present plan of care with xeroform gauze over suture sites, Kerlix to protect, and ace wrap.  Pt plans to return to ortho surgeon's office on Friday for further follow-up and appears to be well informed regarding plan of care. Please re-consult if further assistance is needed.  Thank-you,  Cammie Mcgeeawn Chrisette Man MSN, RN, CWOCN, [place], CNS [phone]

## 2014-01-28 LAB — GLUCOSE, CAPILLARY
GLUCOSE-CAPILLARY: 161 mg/dL — AB (ref 70–99)
Glucose-Capillary: 196 mg/dL — ABNORMAL HIGH (ref 70–99)
Glucose-Capillary: 131 mg/dL — ABNORMAL HIGH (ref 70–99)
Glucose-Capillary: 213 mg/dL — ABNORMAL HIGH (ref 70–99)
Glucose-Capillary: 141 mg/dL — ABNORMAL HIGH (ref 70–99)
Glucose-Capillary: 140 mg/dL — ABNORMAL HIGH (ref 70–99)

## 2014-01-28 LAB — CBC
HEMATOCRIT: 29 % — AB (ref 36.0–46.0)
HEMOGLOBIN: 10.1 g/dL — AB (ref 12.0–15.0)
MCH: 30.4 pg (ref 26.0–34.0)
MCHC: 34.8 g/dL (ref 30.0–36.0)
MCV: 87.3 fL (ref 78.0–100.0)
Platelets: 338 10*3/uL (ref 150–400)
RBC: 3.32 MIL/uL — ABNORMAL LOW (ref 3.87–5.11)
RDW: 12.1 % (ref 11.5–15.5)
WBC: 7.2 10*3/uL (ref 4.0–10.5)

## 2014-01-28 LAB — BASIC METABOLIC PANEL
BUN: 12 mg/dL (ref 6–23)
CALCIUM: 9 mg/dL (ref 8.4–10.5)
CO2: 28 mEq/L (ref 19–32)
CREATININE: 1.26 mg/dL — AB (ref 0.50–1.10)
Chloride: 98 mEq/L (ref 96–112)
GFR calc Af Amer: 57 mL/min — ABNORMAL LOW (ref >90)
GFR, EST NON AFRICAN AMERICAN: 49 mL/min — AB (ref >90)
GLUCOSE: 139 mg/dL — AB (ref 70–99)
Potassium: 3 mEq/L — ABNORMAL LOW (ref 3.7–5.3)
Sodium: 140 mEq/L (ref 137–147)

## 2014-01-28 MED ORDER — POTASSIUM CHLORIDE 10 MEQ/100ML IV SOLN
10.0000 meq | INTRAVENOUS | Status: AC
  Administered 2014-01-28 (×4): 10 meq via INTRAVENOUS
  Filled 2014-01-28 (×4): qty 100

## 2014-01-28 MED ORDER — METOCLOPRAMIDE HCL 5 MG/ML IJ SOLN
10.0000 mg | Freq: Four times a day (QID) | INTRAMUSCULAR | Status: AC
  Administered 2014-01-28 (×2): 10 mg via INTRAVENOUS

## 2014-01-28 NOTE — Progress Notes (Signed)
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 She was discharged on amoxicillin 500 mg by mouth 3 times a day. She presented to the emergency room on 01/24/2049 with complaints of nausea vomiting and diarrhea. She had a CT scan of abdomen and pelvis without contrast performed on admission which showed no acute abnormality. Patient was made n.p.o., and administered IV fluids. On 01/25/2014 she reported persistent nausea vomiting, despite receiving zofran therapy. Initially I had considered NG tube placement for gastric decompression, however patient did not want to undergo this procedure. As I suspected diabetic gastroparesis as the cause of her symptoms, Reglan therapy was initiated. She improved with this intervention however continued to have episodes of nausea and vomiting. On 01/27/2014 I ordered a right upper quadrant ultrasound to assess for other potential causes of her nausea and vomiting.  Plan: I think N/V related to gastroparesis. RUQ [place] ordered to assess for other potential causes. Meanwhile will continue IV reglan, IV fluids, supportive care.    Assessment/Plan: 1. Intractable nausea vomiting. Patient continued to have nausea vomiting. Given her history of poorly controlled diabetes mellitus with multiple complications, suspected secondary to diabetic gastroparesis -Will increase Reglan to every 6 hours -Ultrasound with sludge ball within gallbladder, but no evidence of cholecystitis noted>> not likely the etiology of her intractable nausea  vomiting especially at she denies any abdominal pain -Obtain gastric emptying scan to further evaluate for gastroparesis  2. Probable gastroparesis in setting of uncontrolled diabetes mellitus. Dr. Vanessa Barbara recommended a NG-tube placement for decompression, however patient refused this intervention expressing wishes to continue with antibiotic therapy.  -An ultrasound 3/17 with sludge ball within gallbladder but no evidence of cholecystitis reported,  -Obtaining gastric emptying scan as above and continue schedule Reglan 10 mg IV 3 times a day  3. Acute Kidney Failure. Likely secondary to prerenal azotemia stemming from repeated episodes, continue IV fluids. A.m. lab  -Resolved with hydration 4. Type 2 diabetes mellitus, poorly controlled.  continue Accu-Cheks every 4 hours and cover with sliding scale insulin. 5. Osteomyelitis, status post amputation of right fifth metatarsal, right second metatarsal, performed on recent hospitalization. Continue Levaquin.  -noted Levaquin started on 3/13 per Dr. Cleophas Dunker and to be continued for 30 days 6. Diarrhea. No further episodes of diarrhea since admission 7. Hypertension. Continue with when necessary hydralazine. Increasing clonidine dose 8. Hypokalemia. Secondary to GI losses -Replete K. also check mag level  Code Status: Full Code Family Communication: None at bedside Disposition Plan: To home when medically ready   Consultants:  Diabetic Coordinator  Wound care  Procedures:    Antibiotics:  Levaquin  HPI/Subjective: Continues to have nausea, and states still vomiting but not as much  Objective: Filed Vitals:   01/28/14 0801  BP: 160/77  Pulse: 90  Temp: 98.7 F (37.1 [name])  Resp: 18    Intake/Output Summary (Last 24 hours) at 01/28/14 1403 Last data filed at 01/28/14 1200  Gross per 24 hour  Intake   2660 ml  Output      0 ml  Net   2660 ml   Filed Weights   01/19/2014 2100 01/25/14 2230 01/27/14 2039  Weight: 63.6 kg  (140 lb 3.4 oz) 64 kg (141 lb 1.5 oz) 65.1 kg (143 lb 8.3 oz)    Exam:   General:  Alert and appropriate in no apparent distress  Cardiovascular: Tachycardic, RRR, NL S1S2  Respiratory: Clear to auscultation, no wheezing rhonchi or rales  Abdomen: Soft, bowel sounds present, nontender nondistended no organomegaly and no masses palpable  Musculoskeletal: Right foot bandaged, no edem to extremities  Data Reviewed: Basic Metabolic Panel:  Recent Labs Lab 01/30/2014 2238 01/25/14 0634 01/26/14 0545 01/27/14 0558 01/28/14 0547  NA 141 143 143 144 140  K 3.7 3.8 3.5* 3.1* 3.0*  CL 103 104 102 102 98  CO2 22 22 25 28 28   GLUCOSE 184* 143* 169* 162* 139*  BUN 26* 23 18 16 12   CREATININE 2.01* 1.96* 1.62* 1.44* 1.26*  CALCIUM 8.8 9.0 9.7 9.6 9.0   Liver Function Tests:  Recent Labs Lab 01/15/2014 1639  AST 13  ALT 9  ALKPHOS 121*  BILITOT 0.4  PROT 8.1  ALBUMIN 3.7    Recent Labs Lab 01/27/2014 1639  LIPASE 23   No results found for this basename: AMMONIA,  in the last 168 hours CBC:  Recent Labs Lab 01/25/2014 1639 02/01/2014 1925 01/26/14 0545 01/28/14 0547  WBC 6.2 6.4 8.7 7.2  HGB 11.2* 10.6* 10.4* 10.1*  HCT 32.8* 31.7* 30.4* 29.0*  MCV 89.1 89.0 89.1 87.3  PLT 386 393 372 338   Cardiac Enzymes:  Recent Labs Lab 02/10/2014 1925 01/25/14 0634 01/25/14 0930  TROPONINI <0.30 <0.30 <0.30   BNP (last 3 results)  Recent Labs  01/20/2014 1925  PROBNP 13.8   CBG:  Recent Labs Lab 01/27/14 2001 01/28/14 01/28/14 0413 01/28/14 0741 01/28/14 1204  GLUCAP 127* 196* 161* 131* 213*    Recent Results (from the past 240 hour(s))  CULTURE, BLOOD (ROUTINE X 2)     Status: None   Collection Time    01/31/2014  7:25 PM      Result Value Ref Range Status   Specimen Description BLOOD LEFT HAND   Final   Special Requests BOTTLES DRAWN AEROBIC ONLY 6CC   Final   Culture  Setup Time     Final   Value: 02/04/2014 23:41     [facility]formed [facility]    Culture     Final   Value:        BLOOD CULTURE RECEIVED NO GROWTH TO DATE CULTURE WILL BE HELD FOR 5 DAYS BEFORE ISSUING A FINAL NEGATIVE REPORT     Performed at [facility]   Report Status PENDING   Incomplete  CULTURE, BLOOD (ROUTINE X 2)     Status: None   Collection Time    02/04/2014  7:40 PM      Result Value Ref Range Status   Specimen Description BLOOD RIGHT ANTECUBITAL   Final   Special Requests BOTTLES DRAWN AEROBIC ONLY [id number]   Final   Culture  Setup Time     Final   Value: 01/23/2014 23:41     Performed at [facility]   Culture     Final   Value:        BLOOD CULTURE RECEIVED NO GROWTH TO DATE CULTURE WILL BE HELD FOR 5 DAYS BEFORE ISSUING A FINAL NEGATIVE REPORT     Performed at [facility]   Report Status PENDING   Incomplete  URINE CULTURE     Status: None   Collection Time  01/23/2014  8:51 PM      Result Value Ref Range Status   Specimen Description URINE, CLEAN CATCH   Final   Special Requests NONE   Final   Culture  Setup Time     Final   Value: 01/15/2014 21:40     Performed at [facility] Count     Final   Value: >=100,000 COLONIES/ML     Performed at [facility]   Culture     Final   Value: KLEBSIELLA OXYTOCA     Performed at [facility]   Report Status 02/20/14 FINAL   Final   Organism ID, Bacteria KLEBSIELLA OXYTOCA   Final     Studies: US Abdomen Limited Ruq  02/20/2014   CLINICAL DATA:  Recurrent nausea and vomiting  EXAM: US ABDOMEN LIMITED - RIGHT UPPER QUADRANT  COMPARISON:  None.  FINDINGS: Gallbladder:  Sludge ball within the fundus measures 1.4 cm. No gallbladder wall thickening or pericholecystic fluid. Negative sonographic Murphy's sign.  Common bile duct:  Diameter: 5.7 mm  Liver:  No focal lesion identified. Within normal limits in parenchymal echogenicity.  IMPRESSION: 1. No acute findings. 2. Sludge ball within the gallbladder.   Electronically Signed   By: Signa Kell M.D.    On: 02/20/14 15:49    Scheduled Meds: . amLODipine  10 mg Oral Daily  . enoxaparin (LOVENOX) injection  30 mg Subcutaneous QHS  . insulin aspart  0-15 Units Subcutaneous 6 times per day  . isosorbide mononitrate  60 mg Oral Daily  . labetalol  100 mg Oral BID  . levofloxacin (LEVAQUIN) IV  500 mg Intravenous Q24H  . metoCLOPramide (REGLAN) injection  10 mg Intravenous 4 times per day  . potassium chloride  10 mEq Intravenous Q1 Hr x 4   Continuous Infusions: . sodium chloride 100 mL/hr (01/28/14 0700)    Active Problems:   Shock liver   Gastroparesis due to DM   Hypertension   Type II or unspecified type diabetes mellitus with unspecified complication, uncontrolled   Neuropathic diabetic ulcer of foot   History of amputation of lesser toe of right foot   Amputation of second toe, right, traumatic   Vomiting and diarrhea   Nausea   Acute renal failure    Time spent: 35 min    Jennifer [name]  [facility] Pager [phone]. If 7PM-7AM, please contact night-coverage at [email or web address], password [facility] 01/28/2014, 2:03 PM  LOS: 4 days

## 2014-01-29 ENCOUNTER — Inpatient Hospital Stay (HOSPITAL_COMMUNITY): Payer: BC Managed Care – PPO

## 2014-01-29 DIAGNOSIS — E876 Hypokalemia: Secondary | ICD-10-CM

## 2014-01-29 LAB — BASIC METABOLIC PANEL
BUN: 10 mg/dL (ref 6–23)
CHLORIDE: 96 meq/L (ref 96–112)
CO2: 27 meq/L (ref 19–32)
Calcium: 9.1 mg/dL (ref 8.4–10.5)
Creatinine, Ser: 1.34 mg/dL — ABNORMAL HIGH (ref 0.50–1.10)
GFR calc non Af Amer: 45 mL/min — ABNORMAL LOW (ref >90)
GFR, EST AFRICAN AMERICAN: 53 mL/min — AB (ref >90)
GLUCOSE: 141 mg/dL — AB (ref 70–99)
Potassium: 3 mEq/L — ABNORMAL LOW (ref 3.7–5.3)
Sodium: 136 mEq/L — ABNORMAL LOW (ref 137–147)

## 2014-01-29 LAB — GLUCOSE, CAPILLARY
GLUCOSE-CAPILLARY: 132 mg/dL — AB (ref 70–99)
GLUCOSE-CAPILLARY: 143 mg/dL — AB (ref 70–99)
Glucose-Capillary: 150 mg/dL — ABNORMAL HIGH (ref 70–99)
Glucose-Capillary: 169 mg/dL — ABNORMAL HIGH (ref 70–99)
Glucose-Capillary: 188 mg/dL — ABNORMAL HIGH (ref 70–99)

## 2014-01-29 LAB — MAGNESIUM: Magnesium: 1.2 mg/dL — ABNORMAL LOW (ref 1.5–2.5)

## 2014-01-29 MED ORDER — MAGNESIUM SULFATE 4000MG/100ML IJ SOLN
4.0000 g | Freq: Once | INTRAMUSCULAR | Status: AC
  Administered 2014-01-29: 4 g via INTRAVENOUS
  Filled 2014-01-29: qty 100

## 2014-01-29 MED ORDER — LORATADINE 10 MG PO TABS
10.0000 mg | ORAL_TABLET | Freq: Every day | ORAL | Status: DC
  Administered 2014-01-29 – 2014-02-03 (×4): 10 mg via ORAL
  Filled 2014-01-29 (×6): qty 1

## 2014-01-29 MED ORDER — POTASSIUM CHLORIDE 10 MEQ/100ML IV SOLN
10.0000 meq | INTRAVENOUS | Status: AC
  Administered 2014-01-29 (×4): 10 meq via INTRAVENOUS
  Filled 2014-01-29 (×4): qty 100

## 2014-01-29 MED ORDER — METOCLOPRAMIDE HCL 5 MG/ML IJ SOLN
10.0000 mg | Freq: Four times a day (QID) | INTRAMUSCULAR | Status: DC
  Administered 2014-01-29 – 2014-02-03 (×19): 10 mg via INTRAVENOUS
  Filled 2014-01-29 (×26): qty 2

## 2014-01-29 MED ORDER — TECHNETIUM TC 99M SULFUR COLLOID: 2.0000 | Freq: Once | INTRAVENOUS | Status: AC | PRN

## 2014-01-29 NOTE — Progress Notes (Addendum)
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 She was discharged on amoxicillin 500 mg by mouth 3 times a day. She presented to the emergency room on 01/24/2049 with complaints of nausea vomiting and diarrhea. She had a CT scan of abdomen and pelvis without contrast performed on admission which showed no acute abnormality. Patient was made n.p.o., and administered IV fluids. On 01/25/2014 she reported persistent nausea vomiting, despite receiving zofran therapy. Initially I had considered NG tube placement for gastric decompression, however patient did not want to undergo this procedure. As I suspected diabetic gastroparesis as the cause of her symptoms, Reglan therapy was initiated. She improved with this intervention however continued to have episodes of nausea and vomiting. On 01/27/2014 I ordered a right upper quadrant ultrasound to assess for other potential causes of her nausea and vomiting.  Plan: I think N/V related to gastroparesis. RUQ US ordered to assess for other potential causes. Meanwhile will continue IV reglan, IV fluids, supportive care.    Assessment/Plan: 1.Intractable nausea vomiting. Patient continued to have nausea vomiting. Given her history of poorly controlled diabetes mellitus with multiple complications, suspected secondary to diabetic gastroparesis -Ultrasound with sludge ball within gallbladder, but no evidence of cholecystitis noted>> not likely the etiology of her intractable nausea vomiting especially at she denies any  abdominal pain -Obtain gastric emptying scan to further evaluate for gastroparesis  2.Gastroparesis in setting of uncontrolled diabetes mellitus. Dr. Vanessa BarbaraZamora recommended a NG-tube placement for decompression,  -An ultrasound 3/17 with sludge ball within gallbladder but no evidence of cholecystitis reported,  -gastric emptying scan c/w gastroparesis  -continue Reglan 10 mg IV 4 times a day -place on gastroparesis diet - still clears for now and follow 3.Acute Kidney Failure. Likely secondary to prerenal azotemia stemming from repeated episodes, continue IV fluids.  -improved with hydration 4.Type 2 diabetes mellitus, poorly controlled.  continue Accu-Cheks every 4 hours and cover with sliding scale insulin. 5.Osteomyelitis, status post amputation of right fifth metatarsal, right second metatarsal, performed on recent hospitalization. Continue Levaquin.  -noted Levaquin started on 3/13 per Dr. Cleophas DunkerWhitfield and to be continued for 30 days 6.Diarrhea. No further episodes of diarrhea since admission 7.Hypertension. Continue with when necessary hydralazine. Increasing clonidine dose 8.Hypokalemia. Secondary to GI losses -Replete K. l 9. Hypomagnesemia -replace mg  Code Status: Full Code Family Communication: None at bedside Disposition Plan: To home when medically ready   Consultants:  Diabetic Coordinator  Wound care  Procedures:    Antibiotics:  Levaquin  HPI/Subjective: Continues to have nausea, and vomiting  Objective: Filed Vitals:   01/29/14 1651  BP: 170/86  Pulse: 98  Temp: 98.6 F (37 C)  Resp: 18    Intake/Output Summary (Last 24 hours) at 01/29/14 1839 Last data filed at 01/29/14 0650  Gross per 24 hour  Intake 1501.66 ml  Output   2850 ml  Net -1348.34 ml   Filed Weights   01/25/14 2230 01/27/14 2039 01/28/14 2032  Weight: 64 kg (141 lb 1.5 oz) 65.1 kg (143 lb 8.3 oz) 64.4 kg (141 lb 15.6 oz)    Exam:   General:  Alert and appropriate in no apparent  distress  Cardiovascular: RRR, NL S1S2  Respiratory: Clear to auscultation, no wheezing rhonchi or rales  Abdomen: Soft, bowel sounds present, nontender nondistended no organomegaly and no masses palpable  Musculoskeletal: Right foot bandaged, no edema to extremities  Data Reviewed: Basic Metabolic Panel:  Recent Labs Lab 01/25/14 0634 01/26/14 0545 01/27/14 0558 01/28/14 0547 01/29/14 0530  NA 143 143 144 140 136*  K 3.8 3.5* 3.1* 3.0* 3.0*  CL 104 102 102 98 96  CO2 22 25 28 28 27   GLUCOSE 143* 169* 162* 139* 141*  BUN 23 18 16 12 10   CREATININE 1.96* 1.62* 1.44* 1.26* 1.34*  CALCIUM 9.0 9.7 9.6 9.0 9.1  MG  --   --   --   --  1.2*   Liver Function Tests:  Recent Labs Lab 01/14/2014 1639  AST 13  ALT 9  ALKPHOS 121*  BILITOT 0.4  PROT 8.1  ALBUMIN 3.7    Recent Labs Lab 01/14/2014 1639  LIPASE 23   No results found for this basename: AMMONIA,  in the last 168 hours CBC:  Recent Labs Lab 01/20/2014 1639 01/19/2014 1925 01/26/14 0545 01/28/14 0547  WBC 6.2 6.4 8.7 7.2  HGB 11.2* 10.6* 10.4* 10.1*  HCT 32.8* 31.7* 30.4* 29.0*  MCV 89.1 89.0 89.1 87.3  PLT 386 393 372 338   Cardiac Enzymes:  Recent Labs Lab 02/06/2014 1925 01/25/14 0634 01/25/14 0930  TROPONINI <0.30 <0.30 <0.30   BNP (last 3 results)  Recent Labs  02/01/2014 1925  PROBNP 13.8   CBG:  Recent Labs Lab 01/28/14 2005 01/29/14 0012 01/29/14 0433 01/29/14 0727 01/29/14 1650  GLUCAP 140* 143* 150* 132* 169*    Recent Results (from the past 240 hour(s))  CULTURE, BLOOD (ROUTINE X 2)     Status: None   Collection Time    01/19/2014  7:25 PM      Result Value Ref Range Status   Specimen Description BLOOD LEFT HAND   Final   Special Requests BOTTLES DRAWN AEROBIC ONLY 6CC   Final   Culture  Setup Time     Final   Value: 01/17/2014 23:41     Performed at [facility]   Culture     Final   Value:        BLOOD CULTURE RECEIVED NO GROWTH TO DATE CULTURE WILL BE HELD FOR  5 DAYS BEFORE ISSUING A FINAL NEGATIVE REPORT     Performed at [facility]   Report Status PENDING   Incomplete  CULTURE, BLOOD (ROUTINE X 2)     Status: None   Collection Time    01/23/2014  7:40 PM      Result Value Ref Range Status   Specimen Description BLOOD RIGHT ANTECUBITAL   Final   Special Requests BOTTLES DRAWN AEROBIC ONLY [facility]   Final   Culture  Setup Time     Final   Value: 01/17/2014 23:41     Performed at [facility]   Culture     Final   Value:        BLOOD CULTURE RECEIVED NO GROWTH TO DATE CULTURE WILL BE HELD FOR 5 DAYS BEFORE ISSUING A FINAL NEGATIVE REPORT     Performed at [facility]   Report Status PENDING   Incomplete  URINE CULTURE     Status: None   Collection Time    02/10/2014  8:51 PM      Result Value Ref Range Status   Specimen  Description URINE, CLEAN CATCH   Final   Special Requests NONE   Final   Culture  Setup Time     Final   Value: 01/21/2014 21:40     Performed at [facility] Count     Final   Value: >=100,000 COLONIES/ML     Performed at [facility]   Culture     Final   Value: KLEBSIELLA OXYTOCA     Performed at [facility]   Report Status 01/27/2014 FINAL   Final   Organism ID, Bacteria KLEBSIELLA OXYTOCA   Final     Studies: Nm Gastric Emptying  01/29/2014   CLINICAL DATA:  Persistent nausea and vomiting.  EXAM: NUCLEAR MEDICINE GASTRIC EMPTYING SCAN  TECHNIQUE: After oral ingestion of radiolabeled meal, sequential abdominal images were obtained for 120 minutes. Residual percentage of activity remaining within the stomach was calculated at 60 and 120 minutes.  RADIOPHARMACEUTICALS:  2.0 mCi ofTechnetium 99-m labeled sulfur colloid  COMPARISON:  None.  FINDINGS: Expected location of the stomach in the left upper quadrant. Ingested meal empties the stomach gradually over the course of the study with 55% retention at 60 min and 59% retention at 120 min (normal retention less than 30%  at a 120 min).  IMPRESSION: Abnormal gastric retention at 2 hr.   Electronically Signed   By: Geanie Cooley M.D.   On: 01/29/2014 15:30    Scheduled Meds: . amLODipine  10 mg Oral Daily  . enoxaparin (LOVENOX) injection  30 mg Subcutaneous QHS  . insulin aspart  0-15 Units Subcutaneous 6 times per day  . isosorbide mononitrate  60 mg Oral Daily  . labetalol  100 mg Oral BID  . levofloxacin (LEVAQUIN) IV  500 mg Intravenous Q24H  . loratadine  10 mg Oral Daily  . magnesium sulfate 1 - 4 g bolus IVPB  4 g Intravenous Once  . metoCLOPramide (REGLAN) injection  10 mg Intravenous 4 times per day  . potassium chloride  10 mEq Intravenous Q1 Hr x 4   Continuous Infusions: . sodium chloride 100 mL/hr at 01/29/14 1646    Active Problems:   Shock liver   Gastroparesis due to DM   Hypertension   Type II or unspecified type diabetes mellitus with unspecified complication, uncontrolled   Neuropathic diabetic ulcer of foot   History of amputation of lesser toe of right foot   Amputation of second toe, right, traumatic   Vomiting and diarrhea   Nausea   Acute renal failure    Time spent: 35 min    Dayshaun Whobrey C  [facility] Pager [phone]. If 7PM-7AM, please contact night-coverage at [email or web address], password [facility] 01/29/2014, 6:39 PM  LOS: 5 days

## 2014-01-30 LAB — CULTURE, BLOOD (ROUTINE X 2)

## 2014-01-30 LAB — BASIC METABOLIC PANEL
BUN: 10 mg/dL (ref 6–23)
CALCIUM: 8.8 mg/dL (ref 8.4–10.5)
CO2: 26 mEq/L (ref 19–32)
Chloride: 97 mEq/L (ref 96–112)
Creatinine, Ser: 1.25 mg/dL — ABNORMAL HIGH (ref 0.50–1.10)
GFR calc non Af Amer: 49 mL/min — ABNORMAL LOW (ref >90)
GFR, EST AFRICAN AMERICAN: 57 mL/min — AB (ref >90)
Glucose, Bld: 133 mg/dL — ABNORMAL HIGH (ref 70–99)
Potassium: 3.2 mEq/L — ABNORMAL LOW (ref 3.7–5.3)
SODIUM: 138 meq/L (ref 137–147)

## 2014-01-30 LAB — GLUCOSE, CAPILLARY
GLUCOSE-CAPILLARY: 124 mg/dL — AB (ref 70–99)
GLUCOSE-CAPILLARY: 146 mg/dL — AB (ref 70–99)
GLUCOSE-CAPILLARY: 155 mg/dL — AB (ref 70–99)
Glucose-Capillary: 133 mg/dL — ABNORMAL HIGH (ref 70–99)
Glucose-Capillary: 182 mg/dL — ABNORMAL HIGH (ref 70–99)
Glucose-Capillary: 114 mg/dL — ABNORMAL HIGH (ref 70–99)

## 2014-01-30 LAB — MAGNESIUM: Magnesium: 2.2 mg/dL (ref 1.5–2.5)

## 2014-01-30 MED ORDER — POTASSIUM CHLORIDE 10 MEQ/100ML IV SOLN
10.0000 meq | INTRAVENOUS | Status: AC
  Administered 2014-01-30 (×3): 10 meq via INTRAVENOUS
  Filled 2014-01-30 (×4): qty 100

## 2014-01-30 NOTE — Progress Notes (Signed)
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 She was discharged on amoxicillin 500 mg by mouth 3 times a day. She presented to the emergency room on 01/24/2049 with complaints of nausea vomiting and diarrhea. She had a CT scan of abdomen and pelvis without contrast performed on admission which showed no acute abnormality. Patient was made n.p.o., and administered IV fluids. On 01/25/2014 she reported persistent nausea vomiting, despite receiving zofran therapy. Initially I had considered NG tube placement for gastric decompression, however patient did not want to undergo this procedure. As I suspected diabetic gastroparesis as the cause of her symptoms, Reglan therapy was initiated. She improved with this intervention however continued to have episodes of nausea and vomiting. On 01/27/2014 I ordered a right upper quadrant ultrasound to assess for other potential causes of her nausea and vomiting.     Assessment/Plan: 1.Intractable nausea vomiting. Patient continued to have nausea vomiting. Given her history of poorly controlled diabetes mellitus with multiple complications, suspected secondary to diabetic gastroparesis -Ultrasound with sludge ball within gallbladder, but no evidence of cholecystitis noted>> not likely the etiology of her intractable nausea vomiting especially at she denies any abdominal pain -Obtain gastric emptying scan to further evaluate for gastroparesis  2.Gastroparesis in setting of uncontrolled diabetes mellitus. Dr. Vanessa BarbaraZamora  recommended a NG-tube placement for decompression following admission the patient declined after attempts to place were unsuccessful.  -An ultrasound 3/17 with sludge ball within gallbladder but no evidence of cholecystitis reported,  -gastric emptying scan c/w gastroparesis  -continue Reglan 10 mg IV 4 times a day -continue gastroparesis diet - advance to full liquids and follow 3.Acute Kidney Failure. Likely secondary to prerenal azotemia stemming from repeated episodes, continue IV fluids.  -improved with hydration 4.Type 2 diabetes mellitus, poorly controlled.  continue Accu-Cheks every 4 hours and cover with sliding scale insulin. 5.Osteomyelitis, status post amputation of right fifth metatarsal, right second metatarsal, performed on recent hospitalization. Continue Levaquin.  -noted Levaquin started on 3/13 per Dr. Cleophas DunkerWhitfield and to be continued for 30 days 6.Diarrhea. No further episodes of diarrhea since admission 7.Hypertension. Continue with when necessary hydralazine. Increasing clonidine dose 8.Hypokalemia. Secondary to GI losses -Again replete 9. Hypomagnesemia -Repleted, mg level within normal limits today  Code Status: Full Code Family Communication: None at bedside Disposition Plan: To home when medically ready   Consultants:  Diabetic Coordinator  Wound care  Procedures:    Antibiotics:  Levaquin  HPI/Subjective: States and beginning to feel better today but still some nausea and 'spitting up'  Objective: Filed Vitals:   01/30/14 0700  BP: 100/77  Pulse: 83  Temp: 98.1 F (36.7 C)  Resp: 18    Intake/Output Summary (Last 24 hours) at 01/30/14 1417 Last data filed at 01/30/14 [id number]  Gross per 24 hour  Intake   3230 ml  Output   2700 ml  Net    530 ml   Filed Weights   01/27/14 2039 01/28/14 2032 01/29/14 2126  Weight: 65.1 kg (143 lb 8.3 oz) 64.4 kg (141 lb 15.6 oz) 65 kg (143 lb 4.8 oz)    Exam:   General:  Alert and appropriate  in no  apparent distress  Cardiovascular: RRR, NL S1S2  Respiratory: Clear to auscultation, no wheezing rhonchi or rales  Abdomen: Soft, bowel sounds present, nontender nondistended no organomegaly and no masses palpable  Musculoskeletal: Right foot bandaged, no edema to extremities  Data Reviewed: Basic Metabolic Panel:  Recent Labs Lab 01/26/14 0545 01/27/14 0558 01/28/14 0547 01/29/14 0530 01/30/14 0500  NA 143 144 140 136* 138  K 3.5* 3.1* 3.0* 3.0* 3.2*  CL 102 102 98 96 97  CO2 25 28 28 27 26   GLUCOSE 169* 162* 139* 141* 133*  BUN 18 16 12 10 10   CREATININE 1.62* 1.44* 1.26* 1.34* 1.25*  CALCIUM 9.7 9.6 9.0 9.1 8.8  MG  --   --   --  1.2* 2.2   Liver Function Tests:  Recent Labs Lab  1639  AST 13  ALT 9  ALKPHOS 121*  BILITOT 0.4  PROT 8.1  ALBUMIN 3.7    Recent Labs Lab 01/27/2014 1639  LIPASE 23   No results found for this basename: AMMONIA,  in the last 168 hours CBC:  Recent Labs Lab 02/07/2014 1639 02/10/2014 1925 01/26/14 0545 01/28/14 0547  WBC 6.2 6.4 8.7 7.2  HGB 11.2* 10.6* 10.4* 10.1*  HCT 32.8* 31.7* 30.4* 29.0*  MCV 89.1 89.0 89.1 87.3  PLT 386 393 372 338   Cardiac Enzymes:  Recent Labs Lab 01/31/2014 1925 01/25/14 0634 01/25/14 0930  TROPONINI <0.30 <0.30 <0.30   BNP (last 3 results)  Recent Labs  01/16/2014 1925  PROBNP 13.8   CBG:  Recent Labs Lab 01/29/14 2014 01/29/14 2359 01/30/14 0409 01/30/14 0744 01/30/14 1207  GLUCAP 188* 155* 133* 146* 124*    Recent Results (from the past 240 hour(s))  CULTURE, BLOOD (ROUTINE X 2)     Status: None   Collection Time    01/13/2014  7:25 PM      Result Value Ref Range Status   Specimen Description BLOOD LEFT HAND   Final   Special Requests BOTTLES DRAWN AEROBIC ONLY 6CC   Final   Culture  Setup Time     Final   Value: 01/20/2014 23:41     Performed at [facility]   Culture     Final   Value: NO GROWTH 5 DAYS     Performed at [facility]   Report  Status 01/30/2014 FINAL   Final  CULTURE, BLOOD (ROUTINE X 2)     Status: None   Collection Time    01/31/2014  7:40 PM      Result Value Ref Range Status   Specimen Description BLOOD RIGHT ANTECUBITAL   Final   Special Requests BOTTLES DRAWN AEROBIC ONLY [facility]   Final   Culture  Setup Time     Final   Value: 01/11/2014 23:41     Performed at [facility]   Culture     Final   Value: NO GROWTH 5 DAYS     Performed at [facility]   Report Status 01/30/2014 FINAL   Final  URINE CULTURE     Status: None   Collection Time    01/28/2014  8:51 PM      Result Value Ref Range Status   Specimen Description URINE, CLEAN CATCH   Final   Special Requests NONE   Final   Culture  Setup Time     Final   Value: 01/23/2014 21:40     Performed at [facility]  Colony Count     Final   Value: >=100,000 COLONIES/ML     Performed at [facility]   Culture     Final   Value: KLEBSIELLA OXYTOCA     Performed at [facility]   Report Status 01/27/2014 FINAL   Final   Organism ID, Bacteria KLEBSIELLA OXYTOCA   Final     Studies: Nm Gastric Emptying  01/29/2014   CLINICAL DATA:  Persistent nausea and vomiting.  EXAM: NUCLEAR MEDICINE GASTRIC EMPTYING SCAN  TECHNIQUE: After oral ingestion of radiolabeled meal, sequential abdominal images were obtained for 120 minutes. Residual percentage of activity remaining within the stomach was calculated at 60 and 120 minutes.  RADIOPHARMACEUTICALS:  2.0 mCi ofTechnetium 99-m labeled sulfur colloid  COMPARISON:  None.  FINDINGS: Expected location of the stomach in the left upper quadrant. Ingested meal empties the stomach gradually over the course of the study with 55% retention at 60 min and 59% retention at 120 min (normal retention less than 30% at a 120 min).  IMPRESSION: Abnormal gastric retention at 2 hr.   Electronically Signed   By: Geanie Cooley M.D.   On: 01/29/2014 15:30    Scheduled Meds: . amLODipine  10 mg Oral Daily   . enoxaparin (LOVENOX) injection  30 mg Subcutaneous QHS  . insulin aspart  0-15 Units Subcutaneous 6 times per day  . isosorbide mononitrate  60 mg Oral Daily  . labetalol  100 mg Oral BID  . levofloxacin (LEVAQUIN) IV  500 mg Intravenous Q24H  . loratadine  10 mg Oral Daily  . metoCLOPramide (REGLAN) injection  10 mg Intravenous 4 times per day   Continuous Infusions: . sodium chloride 100 mL/hr (01/30/14 1610)    Active Problems:   Shock liver   Gastroparesis due to DM   Hypertension   Type II or unspecified type diabetes mellitus with unspecified complication, uncontrolled   Neuropathic diabetic ulcer of foot   History of amputation of lesser toe of right foot   Amputation of second toe, right, traumatic   Vomiting and diarrhea   Nausea   Acute renal failure    Time spent: 35 min    Kierria Feigenbaum C  [facility] Pager [phone]. If 7PM-7AM, please contact night-coverage at [email or web address], password [facility] 01/30/2014, 2:17 PM  LOS: 6 days

## 2014-01-30 NOTE — Progress Notes (Signed)
Utilization review completed.  

## 2014-01-31 LAB — BASIC METABOLIC PANEL
BUN: 8 mg/dL (ref 6–23)
CHLORIDE: 100 meq/L (ref 96–112)
CO2: 28 mEq/L (ref 19–32)
Calcium: 9.2 mg/dL (ref 8.4–10.5)
Creatinine, Ser: 1.22 mg/dL — ABNORMAL HIGH (ref 0.50–1.10)
GFR calc non Af Amer: 51 mL/min — ABNORMAL LOW (ref >90)
GFR, EST AFRICAN AMERICAN: 59 mL/min — AB (ref >90)
GLUCOSE: 119 mg/dL — AB (ref 70–99)
Potassium: 3.1 mEq/L — ABNORMAL LOW (ref 3.7–5.3)
Sodium: 141 mEq/L (ref 137–147)

## 2014-01-31 LAB — GLUCOSE, CAPILLARY
GLUCOSE-CAPILLARY: 103 mg/dL — AB (ref 70–99)
GLUCOSE-CAPILLARY: 125 mg/dL — AB (ref 70–99)
GLUCOSE-CAPILLARY: 119 mg/dL — AB (ref 70–99)
GLUCOSE-CAPILLARY: 148 mg/dL — AB (ref 70–99)
Glucose-Capillary: 138 mg/dL — ABNORMAL HIGH (ref 70–99)

## 2014-01-31 MED ORDER — POTASSIUM CHLORIDE 10 MEQ/100ML IV SOLN
10.0000 meq | INTRAVENOUS | Status: AC
  Administered 2014-01-31 – 2014-02-01 (×4): 10 meq via INTRAVENOUS
  Filled 2014-01-31 (×4): qty 100

## 2014-01-31 NOTE — Progress Notes (Addendum)
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 She was discharged on amoxicillin 500 mg by mouth 3 times a day. She presented to the emergency room on 01/24/2049 with complaints of nausea vomiting and diarrhea. She had a CT scan of abdomen and pelvis without contrast performed on admission which showed no acute abnormality. Patient was made n.p.o., and administered IV fluids. On 01/25/2014 she reported persistent nausea vomiting, despite receiving zofran therapy. Initially I had considered NG tube placement for gastric decompression, however patient did not want to undergo this procedure. As I suspected diabetic gastroparesis as the cause of her symptoms, Reglan therapy was initiated. She improved with this intervention however continued to have episodes of nausea and vomiting. On 01/27/2014 I ordered a right upper quadrant ultrasound to assess for other potential causes of her nausea and vomiting.     Assessment/Plan: 1.Intractable nausea vomiting. Patient continued to have nausea vomiting. Given her history of poorly controlled diabetes mellitus with multiple complications, suspected secondary to diabetic gastroparesis -Ultrasound with sludge ball within gallbladder, but no evidence of cholecystitis noted>> not likely the etiology of her intractable nausea vomiting especially at she denies any abdominal pain -gastric emptying scan with gastroparesis  -no improvement on increased reglan, and gastroparesis diet>> will consult GI for further recs>> to  see pt in am 2.Gastroparesis in setting of uncontrolled diabetes mellitus. Dr. Vanessa BarbaraZamora recommended a NG-tube placement for decompression following admission the patient declined after attempts to place were unsuccessful.  -An ultrasound 3/17 with sludge ball within gallbladder but no evidence of cholecystitis reported,  -gastric emptying scan c/w gastroparesis  -continue Reglan 10 mg IV 4 times a day -continue gastroparesis diet - cantinue full liquids for now and follow -as above consult GI 3.Acute Kidney Failure. Likely secondary to prerenal azotemia stemming from repeated episodes, continue IV fluids.  -improved with hydration 4.Type 2 diabetes mellitus, poorly controlled.  continue Accu-Cheks every 4 hours and cover with sliding scale insulin. 5.Osteomyelitis, status post amputation of right fifth metatarsal, right second metatarsal, performed on recent hospitalization. Continue Levaquin.  -noted Levaquin started on 3/13 per Dr. Cleophas DunkerWhitfield and to be continued for 30 days 6.Diarrhea. No further episodes of diarrhea since admission 7.Hypertension. Continue with when necessary hydralazine. Increasing clonidine dose 8.Hypokalemia. Secondary to GI losses -Again replete K 9. Hypomagnesemia -Repleted, mg level within normal limits today  Code Status: Full Code Family Communication: None at bedside Disposition Plan: To home when medically ready   Consultants:  Diabetic Coordinator  Wound care  Procedures:    Antibiotics:  Levaquin  HPI/Subjective: States no change in n/v  Objective: Filed Vitals:   01/31/14 1113  BP: 155/77  Pulse: 84  Temp: 99.2 F (37.3 C)  Resp: 18    Intake/Output Summary (Last 24 hours) at 01/31/14 1657 Last data filed at 01/31/14 0651  Gross per 24 hour  Intake 3371.67 ml  Output   3405 ml  Net -33.33 ml   Filed Weights   01/27/14 2039 01/28/14 2032 01/29/14 2126  Weight: 65.1 kg (143 lb 8.3 oz) 64.4 kg (141 lb 15.6 oz) 65 kg (143 lb 4.8  oz)  Exam:   General:  Alert and appropriate in no apparent distress  Cardiovascular: RRR, NL S1S2  Respiratory: Clear to auscultation, no wheezing rhonchi or rales  Abdomen: Soft, bowel sounds present, nontender nondistended no organomegaly and no masses palpable  Musculoskeletal: Right foot bandaged, no edema to extremities  Data Reviewed: Basic Metabolic Panel:  Recent Labs Lab 01/27/14 0558 01/28/14 0547 01/29/14 0530 01/30/14 0500 01/31/14 0615  NA 144 140 136* 138 141  K 3.1* 3.0* 3.0* 3.2* 3.1*  CL 102 98 96 97 100  CO2 28 28 27 26 28   GLUCOSE 162* 139* 141* 133* 119*  BUN 16 12 10 10 8   CREATININE 1.44* 1.26* 1.34* 1.25* 1.22*  CALCIUM 9.6 9.0 9.1 8.8 9.2  MG  --   --  1.2* 2.2  --    Liver Function Tests: No results found for this basename: AST, ALT, ALKPHOS, BILITOT, PROT, ALBUMIN,  in the last 168 hours No results found for this basename: LIPASE, AMYLASE,  in the last 168 hours No results found for this basename: AMMONIA,  in the last 168 hours CBC:  Recent Labs Lab 01/15/2014 1925 01/26/14 0545 01/28/14 0547  WBC 6.4 8.7 7.2  HGB 10.6* 10.4* 10.1*  HCT 31.7* 30.4* 29.0*  MCV 89.0 89.1 87.3  PLT 393 372 338   Cardiac Enzymes:  Recent Labs Lab 02/06/2014 1925 01/25/14 0634 01/25/14 0930  TROPONINI <0.30 <0.30 <0.30   BNP (last 3 results)  Recent Labs   1925  PROBNP 13.8   CBG:  Recent Labs Lab 01/30/14 2357 01/31/14 0402 01/31/14 0742 01/31/14 1115 01/31/14 1614  GLUCAP 138* 103* 125* 119* 148*    Recent Results (from the past 240 hour(s))  CULTURE, BLOOD (ROUTINE X 2)     Status: None   Collection Time    02/06/2014  7:25 PM      Result Value Ref Range Status   Specimen Description BLOOD LEFT HAND   Final   Special Requests BOTTLES DRAWN AEROBIC ONLY 6CC   Final   Culture  Setup Time     Final   Value: 01/22/2014 23:41     Performed at [facility]   Culture     Final   Value: NO GROWTH 5 DAYS      Performed at [facility]   Report Status 01/30/2014 FINAL   Final  CULTURE, BLOOD (ROUTINE X 2)     Status: None   Collection Time    02/04/2014  7:40 PM      Result Value Ref Range Status   Specimen Description BLOOD RIGHT ANTECUBITAL   Final   Special Requests BOTTLES DRAWN AEROBIC ONLY [facility]   Final   Culture  Setup Time     Final   Value: 01/17/2014 23:41     Performed at [facility]   Culture     Final   Value: NO GROWTH 5 DAYS     Performed at [facility]   Report Status 01/30/2014 FINAL   Final  URINE CULTURE     Status: None   Collection Time    01/16/2014  8:51 PM      Result Value Ref Range Status   Specimen Description URINE, CLEAN CATCH   Final   Special Requests NONE   Final   Culture  Setup Time     Final   Value: 01/14/2014 21:40     Performed at [facility] Count  Final   Value: >=100,000 COLONIES/ML     Performed at [facility]   Culture     Final   Value: KLEBSIELLA OXYTOCA     Performed at [facility]   Report Status 01/27/2014 FINAL   Final   Organism ID, Bacteria KLEBSIELLA OXYTOCA   Final     Studies: No results found.  Scheduled Meds: . amLODipine  10 mg Oral Daily  . enoxaparin (LOVENOX) injection  30 mg Subcutaneous QHS  . insulin aspart  0-15 Units Subcutaneous 6 times per day  . isosorbide mononitrate  60 mg Oral Daily  . labetalol  100 mg Oral BID  . levofloxacin (LEVAQUIN) IV  500 mg Intravenous Q24H  . loratadine  10 mg Oral Daily  . metoCLOPramide (REGLAN) injection  10 mg Intravenous 4 times per day   Continuous Infusions: . sodium chloride 100 mL/hr at 01/31/14 1608    Active Problems:   Shock liver   Gastroparesis due to DM   Hypertension   Type II or unspecified type diabetes mellitus with unspecified complication, uncontrolled   Neuropathic diabetic ulcer of foot   History of amputation of lesser toe of right foot   Amputation of second toe, right, traumatic    Vomiting and diarrhea   Nausea   Acute renal failure    Time spent: 35 min    Darik Massing C  [facility] Pager [phone]. If 7PM-7AM, please contact night-coverage at [email or web address], password [facility] 01/31/2014, 4:57 PM  LOS: 7 days

## 2014-02-01 LAB — BASIC METABOLIC PANEL
BUN: 7 mg/dL (ref 6–23)
CALCIUM: 9.2 mg/dL (ref 8.4–10.5)
CO2: 27 mEq/L (ref 19–32)
Chloride: 98 mEq/L (ref 96–112)
Creatinine, Ser: 1.08 mg/dL (ref 0.50–1.10)
GFR calc Af Amer: 68 mL/min — ABNORMAL LOW (ref >90)
GFR, EST NON AFRICAN AMERICAN: 59 mL/min — AB (ref >90)
GLUCOSE: 143 mg/dL — AB (ref 70–99)
Potassium: 3.5 mEq/L — ABNORMAL LOW (ref 3.7–5.3)
SODIUM: 139 meq/L (ref 137–147)

## 2014-02-01 LAB — GLUCOSE, CAPILLARY
GLUCOSE-CAPILLARY: 153 mg/dL — AB (ref 70–99)
Glucose-Capillary: 116 mg/dL — ABNORMAL HIGH (ref 70–99)
Glucose-Capillary: 117 mg/dL — ABNORMAL HIGH (ref 70–99)
Glucose-Capillary: 147 mg/dL — ABNORMAL HIGH (ref 70–99)
Glucose-Capillary: 150 mg/dL — ABNORMAL HIGH (ref 70–99)
Glucose-Capillary: 157 mg/dL — ABNORMAL HIGH (ref 70–99)

## 2014-02-01 MED ORDER — ACETAMINOPHEN 325 MG PO TABS
650.0000 mg | ORAL_TABLET | Freq: Four times a day (QID) | ORAL | Status: DC | PRN
  Administered 2014-02-01 – 2014-02-06 (×2): 650 mg via ORAL
  Filled 2014-02-01 (×2): qty 2

## 2014-02-01 MED ORDER — POTASSIUM CHLORIDE 10 MEQ/100ML IV SOLN
10.0000 meq | INTRAVENOUS | Status: AC
  Administered 2014-02-01 (×2): 10 meq via INTRAVENOUS
  Filled 2014-02-01 (×2): qty 100

## 2014-02-01 NOTE — Progress Notes (Addendum)
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 She was discharged on amoxicillin 500 mg by mouth 3 times a day. She presented to the emergency room on 01/24/2049 with complaints of nausea vomiting and diarrhea. She had a CT scan of abdomen and pelvis without contrast performed on admission which showed no acute abnormality. Patient was made n.p.o., and administered IV fluids. On 01/25/2014 she reported persistent nausea vomiting, despite receiving zofran therapy. Initially I had considered NG tube placement for gastric decompression, however patient did not want to undergo this procedure. As I suspected diabetic gastroparesis as the cause of her symptoms, Reglan therapy was initiated. She improved with this intervention however continued to have episodes of nausea and vomiting. On 01/27/2014 I ordered a right upper quadrant ultrasound to assess for other potential causes of her nausea and vomiting.     Assessment/Plan: 1.Intractable nausea vomiting. Patient continued to have nausea vomiting. Given her history of poorly controlled diabetes mellitus with multiple complications, suspected secondary to diabetic gastroparesis -Ultrasound with sludge ball within gallbladder, but no evidence of cholecystitis noted>> not likely the etiology of her intractable nausea vomiting especially at she denies any abdominal pain -gastric emptying scan with gastroparesis  -no improvement on increased reglan, and gastroparesis diet>>  GI consulted for further recs>>see #2  as discussed 2.Gastroparesis in setting of uncontrolled diabetes mellitus. Dr. Vanessa BarbaraZamora recommended a NG-tube placement for decompression following admission the patient declined after attempts to place were unsuccessful.  -An ultrasound 3/17 with sludge ball within gallbladder but no evidence of cholecystitis reported,  -gastric emptying scan c/w gastroparesis  -continue Reglan 10 mg IV 4 times a day -continue gastroparesis diet - cantinue full liquids for now and follow -Patient seen by GI EGD planned for a.m., appreciate the assistance>> follow for further recommendations 3.Acute Kidney Failure. Likely secondary to prerenal azotemia stemming from repeated episodes, continue IV fluids.  -improved with hydration 4.Type 2 diabetes mellitus, poorly controlled.  continue Accu-Cheks every 4 hours and cover with sliding scale insulin. 5.Osteomyelitis, status post amputation of right fifth metatarsal, right second metatarsal, performed on recent hospitalization. Continue Levaquin.  -noted Levaquin started on 3/13 per Dr. Cleophas DunkerWhitfield and to be continued for 30 days 6.Diarrhea. No further episodes of diarrhea since admission 7.Hypertension. Continue with when necessary hydralazine. Increasing clonidine dose 8.Hypokalemia. Secondary to GI losses -Again replete K 9. Hypomagnesemia -Repleted, mg level within normal limits today  Code Status: Full Code Family Communication: None at bedside Disposition Plan: To home when medically ready   Consultants:  Diabetic Coordinator  Wound care  Procedures:    Antibiotics:  Levaquin  HPI/Subjective: Continues to have nausea vomiting  Objective: Filed Vitals:   02/01/14 1128  BP: 161/79  Pulse: 86  Temp: 98.7 F (37.1 C)  Resp: 18    Intake/Output Summary (Last 24 hours) at 02/01/14 1443 Last data filed at 01/31/14 2252  Gross per 24 hour  Intake    340 ml  Output      0 ml  Net    340 ml   Filed Weights   01/28/14 2032 01/29/14 2126  01/31/14 2017  Weight: 64.4 kg (141  lb 15.6 oz) 65 kg (143 lb 4.8 oz) 61.871 kg (136 lb 6.4 oz)    Exam:   General:  Alert and appropriate in no apparent distress  Cardiovascular: RRR, NL S1S2  Respiratory: Clear to auscultation, no wheezing rhonchi or rales  Abdomen: Soft, bowel sounds present, nontender nondistended no organomegaly and no masses palpable  Musculoskeletal: Right foot bandaged, no edema to extremities  Data Reviewed: Basic Metabolic Panel:  Recent Labs Lab 01/28/14 0547 01/29/14 0530 01/30/14 0500 01/31/14 0615 02/01/14 0515  NA 140 136* 138 141 139  K 3.0* 3.0* 3.2* 3.1* 3.5*  CL 98 96 97 100 98  CO2 28 27 26 28 27   GLUCOSE 139* 141* 133* 119* 143*  BUN 12 10 10 8 7   CREATININE 1.26* 1.34* 1.25* 1.22* 1.08  CALCIUM 9.0 9.1 8.8 9.2 9.2  MG  --  1.2* 2.2  --   --    Liver Function Tests: No results found for this basename: AST, ALT, ALKPHOS, BILITOT, PROT, ALBUMIN,  in the last 168 hours No results found for this basename: LIPASE, AMYLASE,  in the last 168 hours No results found for this basename: AMMONIA,  in the last 168 hours CBC:  Recent Labs Lab 01/26/14 0545 01/28/14 0547  WBC 8.7 7.2  HGB 10.4* 10.1*  HCT 30.4* 29.0*  MCV 89.1 87.3  PLT 372 338   Cardiac Enzymes: No results found for this basename: CKTOTAL, CKMB, CKMBINDEX, TROPONINI,  in the last 168 hours BNP (last 3 results)  Recent Labs  January 29, 2014 1925  PROBNP 13.8   CBG:  Recent Labs Lab 01/31/14 2016 02/01/14 0001 02/01/14 0407 02/01/14 0740 02/01/14 1129  GLUCAP 147* 150* 116* 157* 117*    Recent Results (from the past 240 hour(s))  CULTURE, BLOOD (ROUTINE X 2)     Status: None   Collection Time    01/29/2014  7:25 PM      Result Value Ref Range Status   Specimen Description BLOOD LEFT HAND   Final   Special Requests BOTTLES DRAWN AEROBIC ONLY 6CC   Final   Culture  Setup Time     Final   Value: 01/29/2014 23:41     Performed at [facility]    Culture     Final   Value: NO GROWTH 5 DAYS     Performed at [facility]   Report Status 01/30/2014 FINAL   Final  CULTURE, BLOOD (ROUTINE X 2)     Status: None   Collection Time    January 29, 2014  7:40 PM      Result Value Ref Range Status   Specimen Description BLOOD RIGHT ANTECUBITAL   Final   Special Requests BOTTLES DRAWN AEROBIC ONLY [facility]   Final   Culture  Setup Time     Final   Value: Jan 29, 2014 23:41     Performed at [facility]   Culture     Final   Value: NO GROWTH 5 DAYS     Performed at [facility]   Report Status 01/30/2014 FINAL   Final  URINE CULTURE     Status: None   Collection Time    2014/01/29  8:51 PM      Result Value Ref Range Status   Specimen Description URINE, CLEAN CATCH   Final   Special Requests NONE   Final   Culture  Setup Time     Final   Value: 01/29/2014 21:40     Performed at  [facility] [employer] Count     Final   Value: >=100,000 COLONIES/ML     Performed at [facility]     Final   Value: KLEBSIELLA OXYTOCA     Performed at [facility]   Report Status 01/27/2014 FINAL   Final   Organism ID, Bacteria KLEBSIELLA OXYTOCA   Final     Studies: No results found.  Scheduled Meds: . amLODipine  10 mg Oral Daily  . enoxaparin (LOVENOX) injection  30 mg Subcutaneous QHS  . insulin aspart  0-15 Units Subcutaneous 6 times per day  . isosorbide mononitrate  60 mg Oral Daily  . labetalol  100 mg Oral BID  . levofloxacin (LEVAQUIN) IV  500 mg Intravenous Q24H  . loratadine  10 mg Oral Daily  . metoCLOPramide (REGLAN) injection  10 mg Intravenous 4 times per day   Continuous Infusions: . sodium chloride 100 mL/hr at 02/01/14 1610    Active Problems:   Shock liver   Gastroparesis due to DM   Hypertension   Type II or unspecified type diabetes mellitus with unspecified complication, uncontrolled   Neuropathic diabetic ulcer of foot   History of amputation of lesser toe of right foot    Amputation of second toe, right, traumatic   Vomiting and diarrhea   Nausea   Acute renal failure    Time spent: 35 min    Stormey Wilborn C  [facility] Pager [phone]. If 7PM-7AM, please contact night-coverage at [email or web address], password [facility] 02/01/2014, 2:43 PM  LOS: 8 days

## 2014-02-01 NOTE — Consult Note (Addendum)
[facility] Consult Note  Referring Provider: No ref. provider found Primary Care Physician:  Blanchie Serve, MD Primary Gastroenterologist:  Dr.  Laurel Dimmer Complaint: Refractory nausea and vomiting HPI: Jennifer [name] is an 53 y.o. black female  with long-standing insulin-dependent diabetes and documented gastroparesis who is admitted with nausea and vomiting a week ago and has been unresponsive to IV Reglan and other supportive care. She had an updated gastric emptying study which was abnormal. She has had an abdominal ultrasound this admission which showed a sludge ball in the gallbladder but no other abnormalities and she is really not having any abdominal pain. She had an EGD in 2013 when she was having abdominal pain and was unrevealing.  Past Medical History  Diagnosis Date  . Diabetes mellitus   . Ulcer of lower limb, unspecified     ulcer on top of foot (right)  . Slow transit constipation   . Disorder of bone and cartilage, unspecified   . Unspecified essential hypertension   . Orthostatic hypotension   . Reflux esophagitis   . Gastroparesis   . Irritable bowel syndrome   . Flatulence, eructation, and gas pain   . Complication of anesthesia   . PONV (postoperative nausea and vomiting)     Past Surgical History  Procedure Laterality Date  . Esophagogastroduodenoscopy  08/11/2012    Procedure: ESOPHAGOGASTRODUODENOSCOPY (EGD);  Surgeon: Lear Ng, MD;  Location: [facility];  Service: Endoscopy;  Laterality: N/A;  . Tubal ligation    . Toe amputation Left 11/09/2013    partial amputation of great left toe   . Amputation Right 11/07/2013    Procedure: AMPUTATION DIGIT - Right great toe;  Surgeon: Marianna Payment, MD;  Location: [place];  Service: Orthopedics;  Laterality: Right;  . Amputation Left 11/09/2013    Procedure: AMPUTATION DIGIT - left great toe;  Surgeon: Marianna Payment, MD;  Location: [place];  Service: Orthopedics;  Laterality: Left;  .  Amputation Right 01/13/2014    Procedure: AMPUTATION RIGHT FIFTH TOE AND RIGHT SECOND TOE;  Surgeon: Garald Balding, MD;  Location: [place];  Service: Orthopedics;  Laterality: Right;    Medications Prior to Admission  Medication Sig Dispense Refill  . insulin aspart (NOVOLOG) 100 UNIT/ML injection Inject 3-15 Units into the skin 3 (three) times daily before meals. *per sliding scale*      . insulin glargine (LANTUS) 100 UNIT/ML injection Inject 10 Units into the skin at bedtime.      Marland Kitchen oxyCODONE-acetaminophen (PERCOCET/ROXICET) 5-325 MG per tablet Take 1-2 tablets by mouth every 4 (four) hours as needed for severe pain.  50 tablet  0    Allergies: No Known Allergies  Family History  Problem Relation Age of Onset  . Diabetes Mother   . Alzheimer's disease Father     Social History:  reports that she has never smoked. She has never used smokeless tobacco. She reports that she does not drink alcohol or use illicit drugs.  Review of Systems: negative except as above   Blood pressure 151/82, pulse 95, temperature 98.9 F (37.2 C), temperature source Oral, resp. rate 18, height $RemoveBe'5\' 6"'qcCScLTAF$  (1.676 m), weight 61.871 kg (136 lb 6.4 oz), last menstrual period 02/09/2011, SpO2 99.00%. Head: Normocephalic, without obvious abnormality, atraumatic Neck: no adenopathy, no carotid bruit, no JVD, supple, symmetrical, trachea midline and thyroid not enlarged, symmetric, no tenderness/mass/nodules Resp: clear to auscultation bilaterally Cardio: regular rate and rhythm, S1, S2 normal, no murmur, click, rub or gallop  GI: Abdomen soft somewhat scaphoid nontender no hepatomegaly masses or guarding Extremities: extremities normal, atraumatic, no cyanosis or edema  Results for orders placed during the hospital encounter of 02/05/2014 (from the past 48 hour(s))  GLUCOSE, CAPILLARY     Status: Abnormal   Collection Time    01/30/14 12:07 PM      Result Value Ref Range   Glucose-Capillary 124 (*) 70 - 99 mg/dL    Comment 1 Notify RN     Comment 2 Documented in Chart    GLUCOSE, CAPILLARY     Status: Abnormal   Collection Time    01/30/14  4:28 PM      Result Value Ref Range   Glucose-Capillary 182 (*) 70 - 99 mg/dL   Comment 1 Notify RN     Comment 2 Documented in Chart    GLUCOSE, CAPILLARY     Status: Abnormal   Collection Time    01/30/14  8:10 PM      Result Value Ref Range   Glucose-Capillary 114 (*) 70 - 99 mg/dL  GLUCOSE, CAPILLARY     Status: Abnormal   Collection Time    01/30/14 11:57 PM      Result Value Ref Range   Glucose-Capillary 138 (*) 70 - 99 mg/dL  GLUCOSE, CAPILLARY     Status: Abnormal   Collection Time    01/31/14  4:02 AM      Result Value Ref Range   Glucose-Capillary 103 (*) 70 - 99 mg/dL  BASIC METABOLIC PANEL     Status: Abnormal   Collection Time    01/31/14  6:15 AM      Result Value Ref Range   Sodium 141  137 - 147 mEq/L   Potassium 3.1 (*) 3.7 - 5.3 mEq/L   Chloride 100  96 - 112 mEq/L   CO2 28  19 - 32 mEq/L   Glucose, Bld 119 (*) 70 - 99 mg/dL   BUN 8  6 - 23 mg/dL   Creatinine, Ser 1.22 (*) 0.50 - 1.10 mg/dL   Calcium 9.2  8.4 - 10.5 mg/dL   GFR calc non Af Amer 51 (*) >90 mL/min   GFR calc Af Amer 59 (*) >90 mL/min   Comment: (NOTE)     The eGFR has been calculated using the CKD EPI equation.     This calculation has not been validated in all clinical situations.     eGFR's persistently <90 mL/min signify possible Chronic Kidney     Disease.  GLUCOSE, CAPILLARY     Status: Abnormal   Collection Time    01/31/14  7:42 AM      Result Value Ref Range   Glucose-Capillary 125 (*) 70 - 99 mg/dL  GLUCOSE, CAPILLARY     Status: Abnormal   Collection Time    01/31/14 11:15 AM      Result Value Ref Range   Glucose-Capillary 119 (*) 70 - 99 mg/dL  GLUCOSE, CAPILLARY     Status: Abnormal   Collection Time    01/31/14  4:14 PM      Result Value Ref Range   Glucose-Capillary 148 (*) 70 - 99 mg/dL  GLUCOSE, CAPILLARY     Status: Abnormal    Collection Time    01/31/14  8:16 PM      Result Value Ref Range   Glucose-Capillary 147 (*) 70 - 99 mg/dL  GLUCOSE, CAPILLARY     Status: Abnormal   Collection Time  02/01/14 12:01 AM      Result Value Ref Range   Glucose-Capillary 150 (*) 70 - 99 mg/dL  GLUCOSE, CAPILLARY     Status: Abnormal   Collection Time    02/01/14  4:07 AM      Result Value Ref Range   Glucose-Capillary 116 (*) 70 - 99 mg/dL  BASIC METABOLIC PANEL     Status: Abnormal   Collection Time    02/01/14  5:15 AM      Result Value Ref Range   Sodium 139  137 - 147 mEq/L   Potassium 3.5 (*) 3.7 - 5.3 mEq/L   Chloride 98  96 - 112 mEq/L   CO2 27  19 - 32 mEq/L   Glucose, Bld 143 (*) 70 - 99 mg/dL   BUN 7  6 - 23 mg/dL   Creatinine, Ser 1.08  0.50 - 1.10 mg/dL   Calcium 9.2  8.4 - 10.5 mg/dL   GFR calc non Af Amer 59 (*) >90 mL/min   GFR calc Af Amer 68 (*) >90 mL/min   Comment: (NOTE)     The eGFR has been calculated using the CKD EPI equation.     This calculation has not been validated in all clinical situations.     eGFR's persistently <90 mL/min signify possible Chronic Kidney     Disease.  GLUCOSE, CAPILLARY     Status: Abnormal   Collection Time    02/01/14  7:40 AM      Result Value Ref Range   Glucose-Capillary 157 (*) 70 - 99 mg/dL   No results found.  Assessment: Refractory nausea and vomiting presumed secondary to gastroparesis secondary to long-standing insulin-dependent diabetes, not responding to standard doses of IV Reglan after several days Plan:  We'll proceed with EGD to rule out candidal esophagitis or severe erosive esophagitis often seen with these patient as well as acid peptic disease or any mechanical cause of delayed gastric emptying such as partial pyloric outlet from peptic ulcer disease. If this is unrevealing consider increasing dose of Reglan or adding IV erythromycin. Will follow with you. Elisabel Hanover C 02/01/2014, 11:19 AM

## 2014-02-01 NOTE — Care Management Note (Signed)
   Page by Gemma PayorN Ryanne [phone] patient complaining of back pain. During the day patient unable to keep down her by mouth diet, however was able to keep down some by mouth medication.    A/P   Intractable nausea/vomiting -Administer Zofran -Administer Tylenol 650 mg q6 hr  PRN  Back pain -See intractable nausea/vomiting

## 2014-02-02 ENCOUNTER — Encounter (HOSPITAL_COMMUNITY): Admission: EM | Disposition: E | Payer: Self-pay | Source: Home / Self Care | Attending: Internal Medicine

## 2014-02-02 ENCOUNTER — Encounter (HOSPITAL_COMMUNITY): Payer: Self-pay

## 2014-02-02 DIAGNOSIS — K209 Esophagitis, unspecified without bleeding: Secondary | ICD-10-CM

## 2014-02-02 DIAGNOSIS — N390 Urinary tract infection, site not specified: Secondary | ICD-10-CM

## 2014-02-02 HISTORY — PX: ESOPHAGOGASTRODUODENOSCOPY: SHX5428

## 2014-02-02 LAB — BASIC METABOLIC PANEL
BUN: 9 mg/dL (ref 6–23)
CHLORIDE: 98 meq/L (ref 96–112)
CO2: 26 mEq/L (ref 19–32)
CREATININE: 1.13 mg/dL — AB (ref 0.50–1.10)
Calcium: 8.9 mg/dL (ref 8.4–10.5)
GFR calc non Af Amer: 56 mL/min — ABNORMAL LOW (ref >90)
GFR, EST AFRICAN AMERICAN: 65 mL/min — AB (ref >90)
Glucose, Bld: 147 mg/dL — ABNORMAL HIGH (ref 70–99)
POTASSIUM: 3.4 meq/L — AB (ref 3.7–5.3)
Sodium: 137 mEq/L (ref 137–147)

## 2014-02-02 LAB — GLUCOSE, CAPILLARY
GLUCOSE-CAPILLARY: 125 mg/dL — AB (ref 70–99)
GLUCOSE-CAPILLARY: 146 mg/dL — AB (ref 70–99)
GLUCOSE-CAPILLARY: 167 mg/dL — AB (ref 70–99)
GLUCOSE-CAPILLARY: 80 mg/dL (ref 70–99)
Glucose-Capillary: 157 mg/dL — ABNORMAL HIGH (ref 70–99)
Glucose-Capillary: 134 mg/dL — ABNORMAL HIGH (ref 70–99)
Glucose-Capillary: 138 mg/dL — ABNORMAL HIGH (ref 70–99)

## 2014-02-02 SURGERY — EGD (ESOPHAGOGASTRODUODENOSCOPY)
Anesthesia: Moderate Sedation

## 2014-02-02 MED ORDER — MIDAZOLAM HCL 10 MG/2ML IJ SOLN
INTRAMUSCULAR | Status: DC | PRN
  Administered 2014-02-02: 2 mg via INTRAVENOUS
  Administered 2014-02-02: 1 mg via INTRAVENOUS
  Administered 2014-02-02: 2 mg via INTRAVENOUS
  Administered 2014-02-02: 1 mg via INTRAVENOUS

## 2014-02-02 MED ORDER — FENTANYL CITRATE 0.05 MG/ML IJ SOLN
INTRAMUSCULAR | Status: AC
  Filled 2014-02-02: qty 2

## 2014-02-02 MED ORDER — MIDAZOLAM HCL 5 MG/ML IJ SOLN
INTRAMUSCULAR | Status: AC
  Filled 2014-02-02: qty 2

## 2014-02-02 MED ORDER — FENTANYL CITRATE 0.05 MG/ML IJ SOLN
INTRAMUSCULAR | Status: DC | PRN
  Administered 2014-02-02 (×2): 25 ug via INTRAVENOUS

## 2014-02-02 MED ORDER — PANTOPRAZOLE SODIUM 40 MG IV SOLR
40.0000 mg | Freq: Two times a day (BID) | INTRAVENOUS | Status: DC
Start: 2014-02-02 — End: 2014-02-08
  Administered 2014-02-02 – 2014-02-07 (×12): 40 mg via INTRAVENOUS
  Filled 2014-02-02 (×16): qty 40

## 2014-02-02 MED ORDER — DIPHENHYDRAMINE HCL 50 MG/ML IJ SOLN
INTRAMUSCULAR | Status: AC
  Filled 2014-02-02: qty 1

## 2014-02-02 MED ORDER — BUTAMBEN-TETRACAINE-BENZOCAINE 2-2-14 % EX AERO
INHALATION_SPRAY | CUTANEOUS | Status: DC | PRN
  Administered 2014-02-02: 2 via TOPICAL

## 2014-02-02 MED ORDER — SODIUM CHLORIDE 0.9 % IV SOLN
INTRAVENOUS | Status: DC
  Administered 2014-02-02: 500 mL via INTRAVENOUS

## 2014-02-02 NOTE — Progress Notes (Signed)
Pt still having some nausea and vomiting x 1 with moderate amt of green emesis.  Pt receiving reglan and protonix IV.

## 2014-02-02 NOTE — H&P (View-Only) (Signed)
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 She was discharged on amoxicillin 500 mg by mouth 3 times a day. She presented to the emergency room on 01/24/2049 with complaints of nausea vomiting and diarrhea. She had a CT scan of abdomen and pelvis without contrast performed on admission which showed no acute abnormality. Patient was made n.p.o., and administered IV fluids. On 01/25/2014 she reported persistent nausea vomiting, despite receiving zofran therapy. Initially I had considered NG tube placement for gastric decompression, however patient did not want to undergo this procedure. As I suspected diabetic gastroparesis as the cause of her symptoms, Reglan therapy was initiated. She improved with this intervention however continued to have episodes of nausea and vomiting. On 01/27/2014 I ordered a right upper quadrant ultrasound to assess for other potential causes of her nausea and vomiting.     Assessment/Plan: 1.Intractable nausea vomiting. Patient continued to have nausea vomiting. Given her history of poorly controlled diabetes mellitus with multiple complications, suspected secondary to diabetic gastroparesis -Ultrasound with sludge ball within gallbladder, but no evidence of cholecystitis noted>> not likely the etiology of her intractable nausea vomiting especially at she denies any abdominal pain -gastric emptying scan with gastroparesis  -no improvement on increased reglan, and gastroparesis diet>>  GI consulted for further recs>>see #2  as discussed 2.Gastroparesis in setting of uncontrolled diabetes mellitus. Dr. Vanessa BarbaraZamora recommended a NG-tube placement for decompression following admission the patient declined after attempts to place were unsuccessful.  -An ultrasound 3/17 with sludge ball within gallbladder but no evidence of cholecystitis reported,  -gastric emptying scan c/w gastroparesis  -continue Reglan 10 mg IV 4 times a day -continue gastroparesis diet - cantinue full liquids for now and follow -Patient seen by GI EGD planned for a.m., appreciate the assistance>> follow for further recommendations 3.Acute Kidney Failure. Likely secondary to prerenal azotemia stemming from repeated episodes, continue IV fluids.  -improved with hydration 4.Type 2 diabetes mellitus, poorly controlled.  continue Accu-Cheks every 4 hours and cover with sliding scale insulin. 5.Osteomyelitis, status post amputation of right fifth metatarsal, right second metatarsal, performed on recent hospitalization. Continue Levaquin.  -noted Levaquin started on 3/13 per Dr. Cleophas DunkerWhitfield and to be continued for 30 days 6.Diarrhea. No further episodes of diarrhea since admission 7.Hypertension. Continue with when necessary hydralazine. Increasing clonidine dose 8.Hypokalemia. Secondary to GI losses -Again replete K 9. Hypomagnesemia -Repleted, mg level within normal limits today  Code Status: Full Code Family Communication: None at bedside Disposition Plan: To home when medically ready   Consultants:  Diabetic Coordinator  Wound care  Procedures:    Antibiotics:  Levaquin  HPI/Subjective: Continues to have nausea vomiting  Objective: Filed Vitals:   02/01/14 1128  BP: 161/79  Pulse: 86  Temp: 98.7 F (37.1 C)  Resp: 18    Intake/Output Summary (Last 24 hours) at 02/01/14 1443 Last data filed at 01/31/14 2252  Gross per 24 hour  Intake    340 ml  Output      0 ml  Net    340 ml   Filed Weights   01/28/14 2032 01/29/14 2126  01/31/14 2017  Weight: 64.4 kg (141  lb 15.6 oz) 65 kg (143 lb 4.8 oz) 61.871 kg (136 lb 6.4 oz)    Exam:   General:  Alert and appropriate in no apparent distress  Cardiovascular: RRR, NL S1S2  Respiratory: Clear to auscultation, no wheezing rhonchi or rales  Abdomen: Soft, bowel sounds present, nontender nondistended no organomegaly and no masses palpable  Musculoskeletal: Right foot bandaged, no edema to extremities  Data Reviewed: Basic Metabolic Panel:  Recent Labs Lab 01/28/14 0547 01/29/14 0530 01/30/14 0500 01/31/14 0615 02/01/14 0515  NA 140 136* 138 141 139  K 3.0* 3.0* 3.2* 3.1* 3.5*  CL 98 96 97 100 98  CO2 28 27 26 28 27   GLUCOSE 139* 141* 133* 119* 143*  BUN 12 10 10 8 7   CREATININE 1.26* 1.34* 1.25* 1.22* 1.08  CALCIUM 9.0 9.1 8.8 9.2 9.2  MG  --  1.2* 2.2  --   --    Liver Function Tests: No results found for this basename: AST, ALT, ALKPHOS, BILITOT, PROT, ALBUMIN,  in the last 168 hours No results found for this basename: LIPASE, AMYLASE,  in the last 168 hours No results found for this basename: AMMONIA,  in the last 168 hours CBC:  Recent Labs Lab 01/26/14 0545 01/28/14 0547  WBC 8.7 7.2  HGB 10.4* 10.1*  HCT 30.4* 29.0*  MCV 89.1 87.3  PLT 372 338   Cardiac Enzymes: No results found for this basename: CKTOTAL, CKMB, CKMBINDEX, TROPONINI,  in the last 168 hours BNP (last 3 results)  Recent Labs  January 29, 2014 1925  PROBNP 13.8   CBG:  Recent Labs Lab 01/31/14 2016 02/01/14 0001 02/01/14 0407 02/01/14 0740 02/01/14 1129  GLUCAP 147* 150* 116* 157* 117*    Recent Results (from the past 240 hour(s))  CULTURE, BLOOD (ROUTINE X 2)     Status: None   Collection Time    01/29/2014  7:25 PM      Result Value Ref Range Status   Specimen Description BLOOD LEFT HAND   Final   Special Requests BOTTLES DRAWN AEROBIC ONLY 6CC   Final   Culture  Setup Time     Final   Value: 01/29/2014 23:41     Performed at [facility]    Culture     Final   Value: NO GROWTH 5 DAYS     Performed at [facility]   Report Status 01/30/2014 FINAL   Final  CULTURE, BLOOD (ROUTINE X 2)     Status: None   Collection Time    January 29, 2014  7:40 PM      Result Value Ref Range Status   Specimen Description BLOOD RIGHT ANTECUBITAL   Final   Special Requests BOTTLES DRAWN AEROBIC ONLY [facility]   Final   Culture  Setup Time     Final   Value: Jan 29, 2014 23:41     Performed at [facility]   Culture     Final   Value: NO GROWTH 5 DAYS     Performed at [facility]   Report Status 01/30/2014 FINAL   Final  URINE CULTURE     Status: None   Collection Time    2014/01/29  8:51 PM      Result Value Ref Range Status   Specimen Description URINE, CLEAN CATCH   Final   Special Requests NONE   Final   Culture  Setup Time     Final   Value: 01/29/2014 21:40     Performed at  [facility] [employer] [facility] Count     Final   Value: >=100,000 COLONIES/ML     Performed at [facility]     Final   Value: KLEBSIELLA OXYTOCA     Performed at [facility]   Report Status 01/27/2014 FINAL   Final   Organism ID, Bacteria KLEBSIELLA OXYTOCA   Final     Studies: No results found.  Scheduled Meds: . amLODipine  10 mg Oral Daily  . enoxaparin (LOVENOX) injection  30 mg Subcutaneous QHS  . insulin aspart  0-15 Units Subcutaneous 6 times per day  . isosorbide mononitrate  60 mg Oral Daily  . labetalol  100 mg Oral BID  . levofloxacin (LEVAQUIN) IV  500 mg Intravenous Q24H  . loratadine  10 mg Oral Daily  . metoCLOPramide (REGLAN) injection  10 mg Intravenous 4 times per day   Continuous Infusions: . sodium chloride 100 mL/hr at 02/01/14 1610    Active Problems:   Shock liver   Gastroparesis due to DM   Hypertension   Type II or unspecified type diabetes mellitus with unspecified complication, uncontrolled   Neuropathic diabetic ulcer of foot   History of amputation of lesser toe of right foot    Amputation of second toe, right, traumatic   Vomiting and diarrhea   Nausea   Acute renal failure    Time spent: 35 min    Aniah Pauli C  [facility] Pager [phone]. If 7PM-7AM, please contact night-coverage at [email or web address], password [facility] 02/01/2014, 2:43 PM  LOS: 8 days

## 2014-02-02 NOTE — Op Note (Signed)
[facility] [address] [place], [id number]   ENDOSCOPY PROCEDURE REPORT  PATIENT: [name], Jennifer C.  MR#: [id number] BIRTHDATE: Mar 09, 1961 , 52  yrs. old GENDER: Female ENDOSCOPIST: Willis ModenaWilliam Shephanie Romas, MD REFERRED BY:  [facility] PROCEDURE DATE:  Dec 31, 2013 PROCEDURE:  EGD, diagnostic ASA CLASS:     Class III INDICATIONS:  nausea and vomiting. MEDICATIONS: Fentanyl 50 mcg IV and Versed 6 mg IV TOPICAL ANESTHETIC: Cetacaine Spray  DESCRIPTION OF PROCEDURE: After the risks benefits and alternatives of the procedure were thoroughly explained, informed consent was obtained.  The Pentax Gastroscope [id number] endoscope was introduced through the mouth and advanced to the second portion of the duodenum. Without limitations.  The instrument was slowly withdrawn as the mucosa was fully examined.     Findings:  Severe distal erosive esophagitis.  No white plaques or features of infectious esophagitis.  Otherwise normal endoscopy to the second portion of the duodenum.  No ulcers seen.  No features of gastric outlet obstruction.             The scope was then withdrawn from the patient and the procedure completed.  ENDOSCOPIC IMPRESSION:     As above.  Severe reflux esophagitis, which can cause nausea and vomiting but can also be the result of it (reflux secondary to gastroparesis).  RECOMMENDATIONS:     1.  Watch for potential complications of procedure. 2.  Continue metoclopramide 3.  Add pantoprazole 40 mg IV BID. 4.  Clear liquids. 5.  Reflux precautions (HOB at least 30 degrees). 6.  [facility] will follow.  eSigned:  Willis ModenaWilliam Louvina Cleary, MD Dec 31, 2013 9:44 AM   CC:

## 2014-02-02 NOTE — Progress Notes (Signed)
IV to left forearm infiltrated, mild edema noted  To left arm.  Attempted x 1 left wrist area. Unsuccessful, paged IV team.

## 2014-02-02 NOTE — Progress Notes (Signed)
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 She was discharged on amoxicillin 500 mg by mouth 3 times a day. She presented to the emergency room on 01/24/2049 with complaints of nausea vomiting and diarrhea. She had a CT scan of abdomen and pelvis without contrast performed on admission which showed no acute abnormality. Patient was made n.p.o., and administered IV fluids. On 01/25/2014 she reported persistent nausea vomiting, despite receiving zofran therapy. Initially I had considered NG tube placement for gastric decompression, however patient did not want to undergo this procedure. As I suspected diabetic gastroparesis as the cause of her symptoms, Reglan therapy was initiated. She improved with this intervention however continued to have episodes of nausea and vomiting. On 01/27/2014 I ordered a right upper quadrant ultrasound to assess for other potential causes of her nausea and vomiting.     Assessment/Plan: 1.Intractable nausea vomiting. Patient continued to have nausea vomiting. Given her history of poorly controlled diabetes mellitus with multiple complications, suspected secondary to diabetic gastroparesis -Ultrasound with sludge ball within gallbladder, but no evidence of cholecystitis noted>> not likely the etiology of her intractable nausea vomiting especially at she denies any abdominal pain -gastric emptying scan with gastroparesis  -no improvement on increased reglan, and gastroparesis diet>>  EGD today 3/23 with severe lower  esophageal esophagitis and PPI added 2.Gastroparesis in setting of uncontrolled diabetes mellitus. Dr. Vanessa BarbaraZamora recommended a NG-tube placement for decompression following admission the patient declined after attempts to place were unsuccessful.  -An ultrasound 3/17 with sludge ball within gallbladder but no evidence of cholecystitis reported,  -gastric emptying scan c/w gastroparesis  -continue Reglan 10 mg IV 4 times a day -continue gastroparesis diet - continue full liquids for now and follow -no improvement on increased reglan, and gastroparesis diet>>  EGD today 3/23 with severe lower esophageal esophagitis and PPI added as above 3.Acute Kidney Failure. Likely secondary to prerenal azotemia stemming from repeated episodes, continue IV fluids.  -improved with hydration 4.Type 2 diabetes mellitus, poorly controlled.  continue Accu-Cheks every 4 hours and cover with sliding scale insulin. 5.Osteomyelitis, status post amputation of right fifth metatarsal, right second metatarsal, performed on recent hospitalization. Continue Levaquin.  -noted Levaquin started on 3/13 per Dr. Cleophas DunkerWhitfield and to be continued for 30 days 6.Diarrhea. No further episodes of diarrhea since admission 7.Hypertension. Continue with when necessary hydralazine. Increasing clonidine dose 8.Hypokalemia. Secondary to GI losses -Again replete K 9. Hypomagnesemia -Repleted, mg level within normal limits  10. KLEB UTI -continue levaquin Code Status: Full Code Family Communication: None at bedside Disposition Plan: To home when medically ready   Consultants:  Diabetic Coordinator  Wound care  Procedures:    Antibiotics:  Levaquin  HPI/Subjective: S/p EGD, no new c/o, asking appropriate questions  Objective: Filed Vitals:   01/25/2014 1348  BP: 120/64  Pulse: 90  Temp: 98.5 F (36.9 C)  Resp: 18    Intake/Output Summary (Last 24 hours) at 01/13/2014 1742 Last data filed at 01/16/2014 1349  Gross per 24 hour   Intake   5675 ml  Output      2 ml  Net   5673 ml  Filed Weights   01/28/14 2032 01/29/14 2126 01/31/14 2017  Weight: 64.4 kg (141 lb 15.6 oz) 65 kg (143 lb 4.8 oz) 61.871 kg (136 lb 6.4 oz)    Exam:   General:  Alert and appropriate in no apparent distress  Cardiovascular: RRR, NL S1S2  Respiratory: Clear to auscultation, no wheezing rhonchi or rales  Abdomen: Soft, bowel sounds present, nontender nondistended no organomegaly and no masses palpable  Musculoskeletal: Right foot bandaged, no edema to extremities  Data Reviewed: Basic Metabolic Panel:  Recent Labs Lab 01/29/14 0530 01/30/14 0500 01/31/14 0615 02/01/14 0515 01/16/2014 0550  NA 136* 138 141 139 137  K 3.0* 3.2* 3.1* 3.5* 3.4*  CL 96 97 100 98 98  CO2 27 26 28 27 26   GLUCOSE 141* 133* 119* 143* 147*  BUN 10 10 8 7 9   CREATININE 1.34* 1.25* 1.22* 1.08 1.13*  CALCIUM 9.1 8.8 9.2 9.2 8.9  MG 1.2* 2.2  --   --   --    Liver Function Tests: No results found for this basename: AST, ALT, ALKPHOS, BILITOT, PROT, ALBUMIN,  in the last 168 hours No results found for this basename: LIPASE, AMYLASE,  in the last 168 hours No results found for this basename: AMMONIA,  in the last 168 hours CBC:  Recent Labs Lab 01/28/14 0547  WBC 7.2  HGB 10.1*  HCT 29.0*  MCV 87.3  PLT 338   Cardiac Enzymes: No results found for this basename: CKTOTAL, CKMB, CKMBINDEX, TROPONINI,  in the last 168 hours BNP (last 3 results)  Recent Labs  02/07/2014 1925  PROBNP 13.8   CBG:  Recent Labs Lab 02/01/14 2359 02/01/2014 0400 01/13/2014 0726 02/03/2014 1105 01/16/2014 1616  GLUCAP 80 157* 146* 134* 138*    Recent Results (from the past 240 hour(s))  CULTURE, BLOOD (ROUTINE X 2)     Status: None   Collection Time    02/10/2014  7:25 PM      Result Value Ref Range Status   Specimen Description BLOOD LEFT HAND   Final   Special Requests BOTTLES DRAWN AEROBIC ONLY 6CC   Final   Culture  Setup Time     Final   Value:  01/13/2014 23:41     Performed at [facility]   Culture     Final   Value: NO GROWTH 5 DAYS     Performed at [facility]   Report Status 01/30/2014 FINAL   Final  CULTURE, BLOOD (ROUTINE X 2)     Status: None   Collection Time    01/21/2014  7:40 PM      Result Value Ref Range Status   Specimen Description BLOOD RIGHT ANTECUBITAL   Final   Special Requests BOTTLES DRAWN AEROBIC ONLY [facility]   Final   Culture  Setup Time     Final   Value: 02/06/2014 23:41     Performed at [facility]   Culture     Final   Value: NO GROWTH 5 DAYS     Performed at [facility]   Report Status 01/30/2014 FINAL   Final  URINE CULTURE     Status: None   Collection Time    01/31/2014  8:51 PM      Result Value Ref Range Status   Specimen Description URINE, CLEAN CATCH   Final   Special Requests NONE   Final   Culture  Setup Time     Final   Value:  02/18/14 21:40     Performed at [facility] Count     Final   Value: >=100,000 COLONIES/ML     Performed at [facility]   Culture     Final   Value: KLEBSIELLA OXYTOCA     Performed at [facility]   Report Status 01/27/2014 FINAL   Final   Organism ID, Bacteria KLEBSIELLA OXYTOCA   Final     Studies: No results found.  Scheduled Meds: . amLODipine  10 mg Oral Daily  . enoxaparin (LOVENOX) injection  30 mg Subcutaneous QHS  . insulin aspart  0-15 Units Subcutaneous 6 times per day  . isosorbide mononitrate  60 mg Oral Daily  . labetalol  100 mg Oral BID  . levofloxacin (LEVAQUIN) IV  500 mg Intravenous Q24H  . loratadine  10 mg Oral Daily  . metoCLOPramide (REGLAN) injection  10 mg Intravenous 4 times per day  . pantoprazole (PROTONIX) IV  40 mg Intravenous Q12H   Continuous Infusions: . sodium chloride 100 mL/hr at 02/04/2014 7846    Active Problems:   Shock liver   Gastroparesis due to DM   Hypertension   Type II or unspecified type diabetes mellitus with unspecified  complication, uncontrolled   Neuropathic diabetic ulcer of foot   History of amputation of lesser toe of right foot   Amputation of second toe, right, traumatic   Vomiting and diarrhea   Nausea   Acute renal failure    Time spent: 25 min    Odalis Jordan C  [facility] Pager [phone]. If 7PM-7AM, please contact night-coverage at [email or web address], password [facility] 01/21/2014, 5:42 PM  LOS: 9 days

## 2014-02-02 NOTE — Interval H&P Note (Signed)
History and Physical Interval Note:  01/13/2014 9:15 AM  Jennifer [name]  has presented today for surgery, with the diagnosis of Refractory nausea and vomiting  The various methods of treatment have been discussed with the patient and family. After consideration of risks, benefits and other options for treatment, the patient has consented to  Procedure(s): ESOPHAGOGASTRODUODENOSCOPY (EGD) (N/A) as a surgical intervention .  The patient's history has been reviewed, patient examined, no change in status, stable for surgery.  I have reviewed the patient's chart and labs.  Questions were answered to the patient's satisfaction.     Jennifer [name] M  Assessment:  1.  Refractory nausea and vomiting.  History diabetic-gastroparesis.  Plan:  1.  Endoscopy to ensure no ulcer disease, gastric outlet obstruction, or some other UGI tract cause of nausea/vomiting besides gastroparesis. 2.  Risks (bleeding, infection, bowel perforation that could require surgery, sedation-related changes in cardiopulmonary systems), benefits (identification and possible treatment of source of symptoms, exclusion of certain causes of symptoms), and alternatives (watchful waiting, radiographic imaging studies, empiric medical treatment) of upper endoscopy (EGD) were explained to patient/family in detail and patient wishes to proceed.

## 2014-02-03 ENCOUNTER — Inpatient Hospital Stay (HOSPITAL_COMMUNITY): Payer: BC Managed Care – PPO

## 2014-02-03 ENCOUNTER — Encounter (HOSPITAL_COMMUNITY): Payer: Self-pay | Admitting: Gastroenterology

## 2014-02-03 ENCOUNTER — Encounter (HOSPITAL_COMMUNITY): Payer: BC Managed Care – PPO | Admitting: Certified Registered"

## 2014-02-03 ENCOUNTER — Inpatient Hospital Stay (HOSPITAL_COMMUNITY): Payer: BC Managed Care – PPO | Admitting: Certified Registered"

## 2014-02-03 DIAGNOSIS — I469 Cardiac arrest, cause unspecified: Secondary | ICD-10-CM

## 2014-02-03 DIAGNOSIS — R4182 Altered mental status, unspecified: Secondary | ICD-10-CM

## 2014-02-03 DIAGNOSIS — R570 Cardiogenic shock: Secondary | ICD-10-CM

## 2014-02-03 DIAGNOSIS — J9600 Acute respiratory failure, unspecified whether with hypoxia or hypercapnia: Secondary | ICD-10-CM

## 2014-02-03 DIAGNOSIS — K5900 Constipation, unspecified: Secondary | ICD-10-CM

## 2014-02-03 LAB — POCT I-STAT, CHEM 8
BUN: 10 mg/dL (ref 6–23)
BUN: 13 mg/dL (ref 6–23)
CHLORIDE: 98 meq/L (ref 96–112)
Calcium, Ion: 1.13 mmol/L (ref 1.12–1.23)
Calcium, Ion: 1.2 mmol/L (ref 1.12–1.23)
Chloride: 101 mEq/L (ref 96–112)
Creatinine, Ser: 1.7 mg/dL — ABNORMAL HIGH (ref 0.50–1.10)
Creatinine, Ser: 1.9 mg/dL — ABNORMAL HIGH (ref 0.50–1.10)
Glucose, Bld: 317 mg/dL — ABNORMAL HIGH (ref 70–99)
Glucose, Bld: 365 mg/dL — ABNORMAL HIGH (ref 70–99)
HCT: 33 % — ABNORMAL LOW (ref 36.0–46.0)
HEMATOCRIT: 30 % — AB (ref 36.0–46.0)
HEMOGLOBIN: 11.2 g/dL — AB (ref 12.0–15.0)
Hemoglobin: 10.2 g/dL — ABNORMAL LOW (ref 12.0–15.0)
POTASSIUM: 3 meq/L — AB (ref 3.7–5.3)
Potassium: 2.7 mEq/L — CL (ref 3.7–5.3)
SODIUM: 140 meq/L (ref 137–147)
Sodium: 138 mEq/L (ref 137–147)
TCO2: 21 mmol/L (ref 0–100)
TCO2: 24 mmol/L (ref 0–100)

## 2014-02-03 LAB — BASIC METABOLIC PANEL
BUN: 10 mg/dL (ref 6–23)
BUN: 16 mg/dL (ref 6–23)
CALCIUM: 9 mg/dL (ref 8.4–10.5)
CHLORIDE: 100 meq/L (ref 96–112)
CO2: 17 mEq/L — ABNORMAL LOW (ref 19–32)
CO2: 20 mEq/L (ref 19–32)
Calcium: 8.7 mg/dL (ref 8.4–10.5)
Chloride: 94 mEq/L — ABNORMAL LOW (ref 96–112)
Creatinine, Ser: 1.54 mg/dL — ABNORMAL HIGH (ref 0.50–1.10)
Creatinine, Ser: 1.68 mg/dL — ABNORMAL HIGH (ref 0.50–1.10)
GFR calc non Af Amer: 34 mL/min — ABNORMAL LOW (ref >90)
GFR, EST AFRICAN AMERICAN: 44 mL/min — AB (ref >90)
GFR, EST AFRICAN AMERICAN: 40 mL/min — AB (ref >90)
GFR, EST NON AFRICAN AMERICAN: 38 mL/min — AB (ref >90)
GLUCOSE: 324 mg/dL — AB (ref 70–99)
Glucose, Bld: 336 mg/dL — ABNORMAL HIGH (ref 70–99)
Potassium: 3.2 mEq/L — ABNORMAL LOW (ref 3.7–5.3)
Potassium: 2.7 mEq/L — CL (ref 3.7–5.3)
SODIUM: 141 meq/L (ref 137–147)
SODIUM: 139 meq/L (ref 137–147)

## 2014-02-03 LAB — CBC
HCT: 29.7 % — ABNORMAL LOW (ref 36.0–46.0)
HEMOGLOBIN: 10.4 g/dL — AB (ref 12.0–15.0)
MCH: 30.3 pg (ref 26.0–34.0)
MCHC: 35 g/dL (ref 30.0–36.0)
MCV: 86.6 fL (ref 78.0–100.0)
PLATELETS: 264 10*3/uL (ref 150–400)
RBC: 3.43 MIL/uL — ABNORMAL LOW (ref 3.87–5.11)
RDW: 13 % (ref 11.5–15.5)
WBC: 9.7 10*3/uL (ref 4.0–10.5)

## 2014-02-03 LAB — GLUCOSE, CAPILLARY
GLUCOSE-CAPILLARY: 151 mg/dL — AB (ref 70–99)
GLUCOSE-CAPILLARY: 112 mg/dL — AB (ref 70–99)
GLUCOSE-CAPILLARY: 323 mg/dL — AB (ref 70–99)
Glucose-Capillary: 107 mg/dL — ABNORMAL HIGH (ref 70–99)
Glucose-Capillary: 155 mg/dL — ABNORMAL HIGH (ref 70–99)
Glucose-Capillary: 163 mg/dL — ABNORMAL HIGH (ref 70–99)

## 2014-02-03 LAB — POCT I-STAT 3, ART BLOOD GAS (G3+)
ACID-BASE DEFICIT: 4 mmol/L — AB (ref 0.0–2.0)
Acid-base deficit: 6 mmol/L — ABNORMAL HIGH (ref 0.0–2.0)
BICARBONATE: 20.1 meq/L (ref 20.0–24.0)
Bicarbonate: 20.9 mEq/L (ref 20.0–24.0)
O2 Saturation: 100 %
O2 Saturation: 100 %
PCO2 ART: 39.7 mmHg (ref 35.0–45.0)
PH ART: 7.312 — AB (ref 7.350–7.450)
PO2 ART: 234 mmHg — AB (ref 80.0–100.0)
TCO2: 21 mmol/L (ref 0–100)
TCO2: 22 mmol/L (ref 0–100)
pCO2 arterial: 33.9 mmHg — ABNORMAL LOW (ref 35.0–45.0)
pH, Arterial: 7.388 (ref 7.350–7.450)
pO2, Arterial: 389 mmHg — ABNORMAL HIGH (ref 80.0–100.0)

## 2014-02-03 LAB — LACTIC ACID, PLASMA: LACTIC ACID, VENOUS: 12.5 mmol/L — AB (ref 0.5–2.2)

## 2014-02-03 LAB — PROTIME-INR
INR: 1.22 (ref 0.00–1.49)
PROTHROMBIN TIME: 15.1 s (ref 11.6–15.2)

## 2014-02-03 LAB — TROPONIN I: Troponin I: <0.3 ng/mL (ref <0.30)

## 2014-02-03 LAB — APTT: APTT: 25 s (ref 24–37)

## 2014-02-03 MED ORDER — CISATRACURIUM BOLUS VIA INFUSION
0.1000 mg/kg | Freq: Once | INTRAVENOUS | Status: DC
  Filled 2014-02-03: qty 7

## 2014-02-03 MED ORDER — ASPIRIN 300 MG RE SUPP
300.0000 mg | RECTAL | Status: AC
  Administered 2014-02-03: 300 mg via RECTAL
  Filled 2014-02-03: qty 1

## 2014-02-03 MED ORDER — SODIUM CHLORIDE 0.9 % IV SOLN
1.0000 ug/kg/min | INTRAVENOUS | Status: DC
  Filled 2014-02-03: qty 20

## 2014-02-03 MED ORDER — SODIUM CHLORIDE 0.9 % IV SOLN: 2000.0000 mL | Freq: Once | INTRAVENOUS | Status: DC

## 2014-02-03 MED ORDER — ARTIFICIAL TEARS OP OINT
1.0000 "application " | TOPICAL_OINTMENT | Freq: Three times a day (TID) | OPHTHALMIC | Status: DC
  Administered 2014-02-03 – 2014-02-05 (×5): 1 via OPHTHALMIC
  Filled 2014-02-03 (×2): qty 3.5

## 2014-02-03 MED ORDER — MIDAZOLAM HCL 5 MG/ML IJ SOLN: 2.0000 mg | Freq: Once | INTRAMUSCULAR | Status: DC

## 2014-02-03 MED ORDER — BISACODYL 10 MG RE SUPP: 10.0000 mg | Freq: Every day | RECTAL | Status: DC | PRN

## 2014-02-03 MED ORDER — SODIUM CHLORIDE 0.9 % IV SOLN
1.0000 mg/h | INTRAVENOUS | Status: DC
  Administered 2014-02-03 – 2014-02-05 (×2): 2 mg/h via INTRAVENOUS
  Filled 2014-02-03 (×3): qty 10

## 2014-02-03 MED ORDER — NOREPINEPHRINE BITARTRATE 1 MG/ML IJ SOLN
2.0000 ug/min | INTRAVENOUS | Status: DC
  Administered 2014-02-03: 11 ug/min via INTRAVENOUS
  Filled 2014-02-03: qty 4

## 2014-02-03 MED ORDER — FENTANYL CITRATE 0.05 MG/ML IJ SOLN: 100.0000 ug | Freq: Once | INTRAMUSCULAR | Status: AC | PRN

## 2014-02-03 MED ORDER — NOREPINEPHRINE BITARTRATE 1 MG/ML IJ SOLN
0.5000 ug/min | INTRAVENOUS | Status: DC
  Filled 2014-02-03: qty 4

## 2014-02-03 MED ORDER — CISATRACURIUM BOLUS VIA INFUSION
0.0500 mg/kg | INTRAVENOUS | Status: DC | PRN
  Filled 2014-02-03: qty 4

## 2014-02-03 MED ORDER — SODIUM CHLORIDE 0.9 % IV SOLN
INTRAVENOUS | Status: DC
  Administered 2014-02-03: 22:00:00 via INTRAVENOUS
  Administered 2014-02-05 – 2014-02-06 (×2): 100 mL/h via INTRAVENOUS
  Administered 2014-02-06: 17:00:00 via INTRAVENOUS

## 2014-02-03 MED ORDER — HEPARIN (PORCINE) IN NACL 100-0.45 UNIT/ML-% IJ SOLN
800.0000 [IU]/h | INTRAMUSCULAR | Status: DC
  Administered 2014-02-03: 500 [IU]/h via INTRAVENOUS
  Administered 2014-02-04 – 2014-02-05 (×2): 600 [IU]/h via INTRAVENOUS
  Administered 2014-02-06: 800 [IU]/h via INTRAVENOUS
  Filled 2014-02-03 (×3): qty 250

## 2014-02-03 MED ORDER — CISATRACURIUM BOLUS VIA INFUSION
0.0500 mg/kg | INTRAVENOUS | Status: DC | PRN
Start: 2014-02-03 — End: 2014-02-03
  Filled 2014-02-03: qty 4

## 2014-02-03 MED ORDER — SODIUM CHLORIDE 0.9 % IV SOLN
0.5000 ug/kg/min | INTRAVENOUS | Status: DC
  Filled 2014-02-03: qty 20

## 2014-02-03 MED ORDER — MIDAZOLAM HCL 5 MG/ML IJ SOLN: 2.0000 mg | Freq: Once | INTRAMUSCULAR | Status: AC | PRN

## 2014-02-03 MED ORDER — SODIUM CHLORIDE 0.9 % IV SOLN
10.0000 ug/h | INTRAVENOUS | Status: DC
  Filled 2014-02-03: qty 50

## 2014-02-03 MED ORDER — FENTANYL CITRATE 0.05 MG/ML IJ SOLN: 100.0000 ug | Freq: Once | INTRAMUSCULAR | Status: DC

## 2014-02-03 MED ORDER — SODIUM CHLORIDE 0.9 % IV SOLN
1.0000 mg/h | INTRAVENOUS | Status: DC
  Filled 2014-02-03: qty 10

## 2014-02-03 MED ORDER — FENTANYL BOLUS VIA INFUSION
50.0000 ug | INTRAVENOUS | Status: DC | PRN
  Filled 2014-02-03: qty 50

## 2014-02-03 MED ORDER — ARTIFICIAL TEARS OP OINT: 1.0000 "application " | TOPICAL_OINTMENT | Freq: Three times a day (TID) | OPHTHALMIC | Status: DC

## 2014-02-03 MED ORDER — BIOTENE DRY MOUTH MT LIQD
15.0000 mL | Freq: Four times a day (QID) | OROMUCOSAL | Status: DC
  Administered 2014-02-04 – 2014-02-08 (×18): 15 mL via OROMUCOSAL

## 2014-02-03 MED ORDER — MIDAZOLAM BOLUS VIA INFUSION
2.0000 mg | INTRAVENOUS | Status: DC | PRN
  Filled 2014-02-03: qty 2

## 2014-02-03 MED ORDER — SODIUM CHLORIDE 0.9 % IV SOLN
25.0000 ug/h | INTRAVENOUS | Status: DC
  Administered 2014-02-03: 50 ug/h via INTRAVENOUS
  Filled 2014-02-03 (×2): qty 50

## 2014-02-03 MED ORDER — CHLORHEXIDINE GLUCONATE 0.12 % MT SOLN
15.0000 mL | Freq: Two times a day (BID) | OROMUCOSAL | Status: DC
  Administered 2014-02-03 – 2014-02-08 (×10): 15 mL via OROMUCOSAL
  Filled 2014-02-03 (×9): qty 15

## 2014-02-03 MED ORDER — HEPARIN BOLUS VIA INFUSION
1800.0000 [IU] | Freq: Once | INTRAVENOUS | Status: AC
  Administered 2014-02-03: 1800 [IU] via INTRAVENOUS
  Filled 2014-02-03: qty 1800

## 2014-02-03 MED ORDER — SODIUM CHLORIDE 0.9 % IV SOLN
1.0000 ug/kg/min | INTRAVENOUS | Status: DC
  Administered 2014-02-03: 1 ug/kg/min via INTRAVENOUS
  Filled 2014-02-03: qty 20

## 2014-02-03 MED ORDER — NOREPINEPHRINE BITARTRATE 1 MG/ML IJ SOLN
0.5000 ug/min | INTRAVENOUS | Status: DC
  Administered 2014-02-03: 11 ug/min via INTRAVENOUS
  Filled 2014-02-03: qty 4

## 2014-02-03 MED ORDER — POTASSIUM CHLORIDE 10 MEQ/50ML IV SOLN
10.0000 meq | INTRAVENOUS | Status: AC
  Administered 2014-02-03 – 2014-02-04 (×4): 10 meq via INTRAVENOUS
  Filled 2014-02-03: qty 50

## 2014-02-03 MED ORDER — SODIUM CHLORIDE 0.9 % IV SOLN
INTRAVENOUS | Status: DC
  Administered 2014-02-04: 2.5 [IU]/h via INTRAVENOUS
  Filled 2014-02-03: qty 1

## 2014-02-03 MED ORDER — ASPIRIN 300 MG RE SUPP
300.0000 mg | RECTAL | Status: DC
  Filled 2014-02-03: qty 1

## 2014-02-03 NOTE — Progress Notes (Signed)
eLink Physician-Brief Progress Note Patient Name: Jennifer [name] DOB: 27-Mar-1961 MRN: [id number]  Date of Service  02/03/2014   HPI/Events of Note   BMET    Component Value Date/Time   NA 139 02/03/2014 2108   K 2.7* 02/03/2014 2108   CL 100 02/03/2014 2108   CO2 20 02/03/2014 2108   GLUCOSE 336* 02/03/2014 2108   BUN 16 02/03/2014 2108   CREATININE 1.68* 02/03/2014 2108   CALCIUM 8.7 02/03/2014 2108   GFRNONAA 34* 02/03/2014 2108   GFRAA 40* 02/03/2014 2108      eICU Interventions   Will give 4 runs KCL IV and check Mg.   Intervention Category Major Interventions: Electrolyte abnormality - evaluation and management  Jennifer [name] 02/03/2014, 9:57 PM

## 2014-02-03 NOTE — Progress Notes (Signed)
Subjective: Still with nausea and vomiting (sounds more like regurgitation).  Objective: Vital signs in last 24 hours: Temp:  [98 F (36.7 C)-99.4 F (37.4 C)] 98.5 F (36.9 C) (03/24 0700) Pulse Rate:  [77-92] 77 (03/24 0700) Resp:  [6-26] 18 (03/24 0700) BP: (90-188)/(47-89) 135/79 mmHg (03/24 0700) SpO2:  [95 %-100 %] 99 % (03/24 0700) Weight change:  Last BM Date: 01/29/14  PE: GEN:  NAD, frustrated. ABD:  Soft, non-tender, hypoactive bowel sounds  Lab Results: CBC    Component Value Date/Time   WBC 7.2 01/28/2014 0547   WBC 6.0 06/18/2013 1654   RBC 3.32* 01/28/2014 0547   RBC 3.79 06/18/2013 1654   HGB 10.1* 01/28/2014 0547   HCT 29.0* 01/28/2014 0547   PLT 338 01/28/2014 0547   MCV 87.3 01/28/2014 0547   MCH 30.4 01/28/2014 0547   MCH 31.1 06/18/2013 1654   MCHC 34.8 01/28/2014 0547   MCHC 33.9 06/18/2013 1654   RDW 12.1 01/28/2014 0547   RDW 12.2* 06/18/2013 1654   LYMPHSABS 1.9 01/10/2014 1730   LYMPHSABS 2.7 06/18/2013 1654   MONOABS 0.9 01/10/2014 1730   EOSABS 0.2 01/10/2014 1730   EOSABS 0.1 06/18/2013 1654   BASOSABS 0.0 01/10/2014 1730   BASOSABS 0.0 06/18/2013 1654   CMP     Component Value Date/Time   NA 137 02/01/2014 0550   NA 138 06/18/2013 1654   K 3.4* 02/05/2014 0550   CL 98 01/26/2014 0550   CO2 26 01/23/2014 0550   GLUCOSE 147* 01/11/2014 0550   GLUCOSE 284* 06/18/2013 1654   BUN 9 01/31/2014 0550   BUN 18 06/18/2013 1654   CREATININE 1.13* 01/26/2014 0550   CALCIUM 8.9 02/03/2014 0550   PROT 8.1 01/11/2014 1639   PROT 6.8 05/14/2013 1528   ALBUMIN 3.7 02/03/2014 1639   AST 13 01/12/2014 1639   ALT 9 02/10/2014 1639   ALKPHOS 121* 01/25/2014 1639   BILITOT 0.4 01/23/2014 1639   GFRNONAA 56* 01/11/2014 0550   GFRAA 65* 01/11/2014 0550   Assessment:  1.  Nausea and vomiting.  Ongoing.  No significant improvement.   2.  Reflux esophagitis seen on endoscopy yesterday, just started on PPI yesterday. 3.  Diabetic gastroparesis.  Plan:  1.  No bowel movement for a few days,  suspect it is because she's not eating much, but will get xray to make sure obstipation isn't contributing to her nausea and vomiting. 2.  Patient was lying flat in bed when I saw her this morning; I reminded patient, and also discussed with nursing, that she needs to be at least 30 degrees (preferably 45 degrees) in bed, otherwise her reflux esophagitis is more likely to persist. 3.  Also discussed with nursing and asked them to call dietary to see if we could get patient some non-refluxogenic liquids to try (she is currently given tray with caffeinated soda, acidic juices, etc). 4.  Continue metoclopramide and pantoprazole. 5.  Counseled patient to get OOBTC and even ambulate hall, telling her that this can increase her forward-going GI tract motility and may help her reflux and nausea and vomiting symptoms. 6.  If abdominal xray is unrevealing, and no improvement despite above treatment measures, would need to consider HIDA scan (sludge on GB seen on recent ultrasound). 7.  [facility] will follow.   Freddy JakschOUTLAW,Louis Gaw M 02/03/2014, 8:44 AM

## 2014-02-03 NOTE — Progress Notes (Addendum)
[facility] PROGRESS NOTE  Jennifer [name] ZOX:[id number] DOB: June 25, 1961 DOA: 02-20-14 PCP: [name], MD ADDENDUM Called per nsg pt found unresponsive this PM and code called >>ACLS protocol was followed and she was intubated received Epi, HCO3, pulse obtained and she was place on NE drip and transferred to ICU. Discussed pt with Dr Molli Knock on my arrival >> he has take over pt's care. I also called daughter Channel for update- she is on her way to the hospital.   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 She was discharged on amoxicillin 500 mg by mouth 3 times a day. She presented to the emergency room on 01/24/2049 with complaints of nausea vomiting and diarrhea. She had a CT scan of abdomen and pelvis without contrast performed on admission which showed no acute abnormality. Patient was made n.p.o., and administered IV fluids. On 01/25/2014 she reported persistent nausea vomiting, despite receiving zofran therapy. Initially I had considered NG tube placement for gastric decompression, however patient did not want to undergo this procedure. As I suspected diabetic gastroparesis as the cause of her symptoms, Reglan therapy was initiated. She improved with this intervention however continued to have episodes of nausea and vomiting. On 01/27/2014 I ordered a right upper quadrant ultrasound to assess for other potential causes of her nausea and vomiting.     Assessment/Plan: 1.Intractable nausea vomiting. Patient continued to have  nausea vomiting. Given her history of poorly controlled diabetes mellitus with multiple complications, suspected secondary to diabetic gastroparesis -Ultrasound with sludge ball within gallbladder, but no evidence of cholecystitis noted>> not likely the etiology of her intractable nausea vomiting especially at she denies any abdominal pain -gastric emptying scan with gastroparesis  -no improvement this a.m. on PPI , reglan, and gastroparesis diet>>  EGD 3/23 with severe lower esophageal esophagitis and PPI added 2.Gastroparesis in setting of uncontrolled diabetes mellitus. Dr. Vanessa Barbara recommended a NG-tube placement for decompression following admission the patient declined after attempts to place were unsuccessful.  -An ultrasound 3/17 with sludge ball within gallbladder but no evidence of cholecystitis reported,  -gastric emptying scan c/w gastroparesis  -continue Reglan 10 mg IV 4 times a day -continue gastroparesis diet - continue full liquids for now and follow -no improvement  today on the PPI-continue  -Follow up on abdominal x-rays ordered per GI  3.Acute Kidney Failure. Likely secondary to prerenal azotemia stemming from repeated episodes, continue IV fluids.  -improved with hydration 4.Type 2 diabetes mellitus, poorly controlled.  continue Accu-Cheks every 4 hours and cover with sliding scale insulin. 5.Osteomyelitis, status post amputation of right fifth metatarsal, right second metatarsal, performed on recent hospitalization. Continue Levaquin.  -noted Levaquin started on 3/13 per Dr. Cleophas Dunker and to be continued for 30 days 6.Diarrhea. No further episodes of diarrhea since admission 7.Hypertension. Continue with when necessary hydralazine. Increasing clonidine dose 8.Hypokalemia. Secondary to GI losses -Again replete K 9. Hypomagnesemia -Repleted, mg level within normal limits  10. KLEB UTI -continue levaquin 11.constipation  -Will add Dulcolax when necessary  -Follow up on  abdominal x-rays ordered per GI  Code Status: Full Code Family Communication: None at bedside Disposition Plan: To  home when medically ready   Consultants:  Diabetic Coordinator  Wound care  Procedures:    Antibiotics:  Levaquin  HPI/Subjective:  states she feels about the same, still nauseous and spitting up.complaining of constipation  Objective: Filed Vitals:   02/03/14 1800  BP: 152/97  Pulse: 73  Temp:   Resp: 16    Intake/Output Summary (Last 24 hours) at 02/03/14 1850 Last data filed at 02/03/14 1700  Gross per 24 hour  Intake   1160 ml  Output   7300 ml  Net  -6140 ml   Filed Weights   01/28/14 2032 01/29/14 2126 01/31/14 2017  Weight: 64.4 kg (141 lb 15.6 oz) 65 kg (143 lb 4.8 oz) 61.871 kg (136 lb 6.4 oz)    Exam:   General:  Alert and appropriate in no apparent distress  Cardiovascular: RRR, NL S1S2  Respiratory: Clear to auscultation, no wheezing rhonchi or rales  Abdomen: Soft,  Epigastric tenderness present, bowel sounds present, nondistended no organomegaly and no masses palpable  Musculoskeletal: Right foot bandaged, no edema to extremities  Data Reviewed: Basic Metabolic Panel:  Recent Labs Lab 01/29/14 0530 01/30/14 0500 01/31/14 0615 02/01/14 0515 01/27/2014 0550 02/03/14 1710  NA 136* 138 141 139 137 138  K 3.0* 3.2* 3.1* 3.5* 3.4* 3.0*  CL 96 97 100 98 98 98  CO2 27 26 28 27 26   --   GLUCOSE 141* 133* 119* 143* 147* 317*  BUN 10 10 8 7 9 10   CREATININE 1.34* 1.25* 1.22* 1.08 1.13* 1.70*  CALCIUM 9.1 8.8 9.2 9.2 8.9  --   MG 1.2* 2.2  --   --   --   --    Liver Function Tests: No results found for this basename: AST, ALT, ALKPHOS, BILITOT, PROT, ALBUMIN,  in the last 168 hours No results found for this basename: LIPASE, AMYLASE,  in the last 168 hours No results found for this basename: AMMONIA,  in the last 168 hours CBC:  Recent Labs Lab 01/28/14 0547 02/03/14 1710  WBC 7.2  --   HGB 10.1* 10.2*  HCT 29.0*  30.0*  MCV 87.3  --   PLT 338  --    Cardiac Enzymes: No results found for this basename: CKTOTAL, CKMB, CKMBINDEX, TROPONINI,  in the last 168 hours BNP (last 3 results)  Recent Labs  01/18/2014 1925  PROBNP 13.8   CBG:  Recent Labs Lab  2137 02/03/14 0026 02/03/14 0416 02/03/14 0833 02/03/14 1204  GLUCAP 167* 163* 151* 112* 155*    Recent Results (from the past 240 hour(s))  CULTURE, BLOOD (ROUTINE X 2)     Status: None   Collection Time    01/14/2014  7:25 PM      Result Value Ref Range Status   Specimen Description BLOOD LEFT HAND   Final   Special Requests BOTTLES DRAWN AEROBIC ONLY [facility]   Final   Culture  Setup Time     Final   Value: 01/28/2014 23:41     Performed at [facility]   Culture     Final   Value: NO GROWTH 5 DAYS     Performed at [facility]   Report Status 01/30/2014 FINAL   Final  CULTURE, BLOOD (ROUTINE X 2)     Status: None   Collection Time    02/10/2014  7:40 PM      Result Value Ref Range Status   Specimen Description BLOOD RIGHT ANTECUBITAL   Final  Special Requests BOTTLES DRAWN AEROBIC ONLY [facility]   Final   Culture  Setup Time     Final   Value: February 08, 2014 23:41     Performed at [facility]   Culture     Final   Value: NO GROWTH 5 DAYS     Performed at [facility]   Report Status 01/30/2014 FINAL   Final  URINE CULTURE     Status: None   Collection Time    02/01/2014  8:51 PM      Result Value Ref Range Status   Specimen Description URINE, CLEAN CATCH   Final   Special Requests NONE   Final   Culture  Setup Time     Final   Value: 08-Feb-2014 21:40     Performed at [facility] Count     Final   Value: >=100,000 COLONIES/ML     Performed at [facility]   Culture     Final   Value: KLEBSIELLA OXYTOCA     Performed at [facility]   Report Status 01/27/2014 FINAL   Final   Organism ID, Bacteria KLEBSIELLA OXYTOCA   Final     Studies: Dg Abd 2  Views  02/03/2014   CLINICAL DATA:  Nausea, vomiting and abdominal pain.  EXAM: ABDOMEN - 2 VIEW  COMPARISON:  CT, Feb 08, 2014.  Radiographs, 08/18/2012  FINDINGS: Mild increased stool burden in the colon. No obstruction. No free air. No generalized adynamic ileus.  Soft tissues are unremarkable. No renal or ureteral stones are evident.  There is a fracture of the posterior left tenth rib. This could be recent or chronic.  IMPRESSION: 1. No acute findings within the abdomen or pelvis. 2. Fracture the posterior left tenth rib of unclear chronicity. 3. Mild increased stool burden in the colon.   Electronically Signed   By: Amie Portland M.D.   On: 02/03/2014 12:28    Scheduled Meds: . sodium chloride  2,000 mL Intravenous Once  . amLODipine  10 mg Oral Daily  . artificial tears  1 application Both Eyes 3 times per day  . aspirin  300 mg Rectal NOW  . cisatracurium  0.1 mg/kg Intravenous Once  . enoxaparin (LOVENOX) injection  30 mg Subcutaneous QHS  . fentaNYL  100 mcg Intravenous Once  . insulin aspart  0-15 Units Subcutaneous 6 times per day  . isosorbide mononitrate  60 mg Oral Daily  . labetalol  100 mg Oral BID  . levofloxacin (LEVAQUIN) IV  500 mg Intravenous Q24H  . loratadine  10 mg Oral Daily  . metoCLOPramide (REGLAN) injection  10 mg Intravenous 4 times per day  . midazolam  2 mg Intravenous Once  . pantoprazole (PROTONIX) IV  40 mg Intravenous Q12H   Continuous Infusions: . sodium chloride 100 mL/hr at 01/11/2014 0643  . cisatracurium (NIMBEX) infusion    . fentaNYL infusion INTRAVENOUS    . midazolam (VERSED) infusion    . norepinephrine (LEVOPHED) Adult infusion      Active Problems:   Shock liver   Gastroparesis due to DM   Hypertension   Type II or unspecified type diabetes mellitus with unspecified complication, uncontrolled   Neuropathic diabetic ulcer of foot   History of amputation of lesser toe of right foot   Amputation of second toe, right, traumatic   Vomiting  and diarrhea   Nausea   Acute renal failure   Cardiac arrest   Cardiogenic shock  Acute respiratory failure   Altered mental status    Time spent: 35 min    Skyelar Swigart C  [facility] Pager [phone]. If 7PM-7AM, please contact night-coverage at [email or web address], password [email or web address] 02/03/2014, 6:50 PM  LOS: 10 days               [facility] Shepherd Specialty HospitalVIYUOH,Trichelle Lehan C  [facility] Pager [phone]. If 7PM-7AM, please contact night-coverage at [email or web address], password [email or web address] 02/03/2014, 6:02 PM  LOS: 10 days

## 2014-02-03 NOTE — Anesthesia Procedure Notes (Signed)
Procedure Name: Intubation Date/Time: 02/03/2014 4:54 PM Performed by: Charm BargesBUTLER, Jamesa Tedrick R Pre-anesthesia Checklist: Patient identified, Emergency Drugs available, Suction available and Patient being monitored Patient Re-evaluated:Patient Re-evaluated prior to inductionOxygen Delivery Method: Circle system utilized Preoxygenation: Pre-oxygenation with 100% oxygen Ventilation: Mask ventilation without difficulty Laryngoscope Size: Mac and 2 Grade View: Grade II Tube type: Subglottic suction tube Tube size: 7.5 mm Number of attempts: 1 Airway Equipment and Method: Stylet Placement Confirmation: ETT inserted through vocal cords under direct vision,  CO2 detector and breath sounds checked- equal and bilateral Secured at: 25 cm Tube secured with: Tape Dental Injury: Teeth and Oropharynx as per pre-operative assessment  Comments: Called to Code Blue with chest compressions in progress. Patient unresponsive. Bag/Valve/Mask ventilation performed by RT. Grade II view with Mac 3. 7.425mm subglottic suction ETT placed atraumatically. Positive EtCO2, Bilateral Breath Sounds Equal. ETT secured at 25 cm.

## 2014-02-03 NOTE — Progress Notes (Signed)
eLink Physician-Brief Progress Note Patient Name: Jennifer [name] DOB: May 30, 1961 MRN: [id number]  Date of Service  02/03/2014   HPI/Events of Note  Ct Portable Head W/o Cm  02/03/2014   CLINICAL DATA:  Unresponsive.  EXAM: CT HEAD WITHOUT CONTRAST  TECHNIQUE: Contiguous axial images were obtained from the base of the skull through the vertex without intravenous contrast.  COMPARISON:  None.  FINDINGS: The basal ganglia and insular cortex appear to be intact bilaterally. No acute cortical infarct, hemorrhage, or mass lesion is present. There is significant artifactual no is on the scan due to portable technique. The ventricles are of normal size. No significant extra-axial fluid collection is present.  The patient is intubated. Minimal fluid is present in the left maxillary sinus. A small polyp or mucous retention cyst is present in the right maxillary sinus. Paranasal sinuses and mastoid air cells are clear. The osseous skull is intact.  IMPRESSION: 1. Gray-white differentiation is preserved without evidence for diffuse edema or ischemic insult. 2. There significant artifact due to portable technique. Follow-up CT scan or repeat CT scan within the department may prove useful for increased sensitivity to progressed ischemic disease if present.   Electronically Signed   By: Gennette Pachris  Mattern M.D.   On: 02/03/2014 19:05     eICU Interventions   Will start hypothermia protocol, and heparin gtt for ACS and possible thrombo-embolic event as cause of PEA cardiac arrest.    Intervention Category Major Interventions: Other:  [name] 02/03/2014, 7:28 PM

## 2014-02-03 NOTE — Progress Notes (Signed)
Pulled back ET Tube per MD order post Xray to 23cm a lip from 27cm. Good BBS noted post procedure.

## 2014-02-03 NOTE — Progress Notes (Signed)
Called Daughter to update her on the status of her mother including the events that happened during the code and that her mother's condition is very serious and she was moved to the ICU with room location provided. All questions answered and daughter stated would be on her way.   Christen BameNora Lorelei Heikkila, MD IM PGY-2 Pgr: [phone]

## 2014-02-03 NOTE — Procedures (Signed)
Arterial Catheter Insertion Procedure Note Jennifer [name] [id number] 05/23/1961  Procedure: Insertion of Arterial Catheter  Indications: Blood pressure monitoring and Frequent blood sampling  Procedure Details Consent: Unable to obtain consent because of emergent medical necessity. Time Out: Verified patient identification, verified procedure, site/side was marked, verified correct patient position, special equipment/implants available, medications/allergies/relevent history reviewed, required imaging and test results available.  Performed  Maximum sterile technique was used including antiseptics, cap, gloves, gown, hand hygiene, mask and sheet. Skin prep: Chlorhexidine; local anesthetic administered 20 gauge catheter was inserted into right radial artery using the Seldinger technique.  Evaluation Blood flow good; BP tracing good. Complications: No apparent complications.   Jennifer [name],Jennifer [name] 02/03/2014

## 2014-02-03 NOTE — Consult Note (Signed)
Patient ID: Jennifer [name] MRN: [id number] DOB/AGE: [birth year] 51 y.o.  Admit date: 01/30/2014 Primary Cardiologist: None Reason for Consultation: Cardiac arrest  HPI: 53 yo female with history of DM, diabetic neuropathy s/p toe amputations, gastroparesis, HTN, GERD, IBS admitted with N/V/diarrhea on 02/01/2014. [facility] with right 5th toe osteomyelitis.  She presented to the emergency room on 02/07/2014 with complaints of nausea, vomiting and diarrhea. She has since been diagnosed with gastroparesis. EGD with reflux esophagitis. She was in stable condition until earlier today when she had a PEA arrest. Unknown down time as she was not on telemetry. She has been transferred to the CCU. Currently hypotensive on pressors, intubated. Pt non-responsive and with current poor neurological signs.   Unable to obtain ROS as pt intubated.    Past Medical History  Diagnosis Date  . Diabetes mellitus   . Ulcer of lower limb, unspecified     ulcer on top of foot (right)  . Slow transit constipation   . Disorder of bone and cartilage, unspecified   . Unspecified essential hypertension   . Orthostatic hypotension   . Reflux esophagitis   . Gastroparesis   . Irritable bowel syndrome   . Flatulence, eructation, and gas pain   . Complication of anesthesia   . PONV (postoperative nausea and vomiting)     Family History  Problem Relation Age of Onset  . Diabetes Mother   . Alzheimer's disease Father     History   Social History  . Marital Status: Divorced    Spouse Name: N/A    Number of Children: N/A  . Years of Education: N/A   Occupational History  . Not on file.   Social History Main Topics  . Smoking status: Never Smoker   . Smokeless tobacco: Never Used  . Alcohol Use: No  . Drug Use: No  . Sexual Activity: Not on file   Other Topics Concern  . Not on file   Social History Narrative   ** Merged History Encounter **        Past Surgical  History  Procedure Laterality Date  . Esophagogastroduodenoscopy  08/11/2012    Procedure: ESOPHAGOGASTRODUODENOSCOPY (EGD);  Surgeon: Shirley FriarVincent C. Schooler, MD;  [facility];  Service: Endoscopy;  Laterality: N/A;  . Tubal ligation    . Toe amputation Left 11/09/2013    partial amputation of great left toe   . Amputation Right 11/07/2013    Procedure: AMPUTATION DIGIT - Right great toe;  Surgeon: Cheral AlmasNaiping Michael Xu, MD;  [facility];  Service: Orthopedics;  Laterality: Right;  . Amputation Left 11/09/2013    Procedure: AMPUTATION DIGIT - left great toe;  Surgeon: Cheral AlmasNaiping Michael Xu, MD;  [facility];  Service: Orthopedics;  Laterality: Left;  . Amputation Right 01/13/2014    Procedure: AMPUTATION RIGHT FIFTH TOE AND RIGHT SECOND TOE;  Surgeon: Valeria BatmanPeter W Whitfield, MD;  [facility];  Service: Orthopedics;  Laterality: Right;  . Esophagogastroduodenoscopy N/A 2013-12-27    Procedure: ESOPHAGOGASTRODUODENOSCOPY (EGD);  Surgeon: Willis ModenaWilliam Outlaw, MD;  [facility];  Service: Endoscopy;  Laterality: N/A;    No Known Allergies  Meds:  . sodium chloride  2,000 mL Intravenous Once  . amLODipine  10 mg Oral Daily  . artificial tears  1 application Both Eyes 3 times per day  . aspirin  300 mg Rectal NOW  . cisatracurium  0.1 mg/kg Intravenous Once  . enoxaparin (LOVENOX) injection  30  mg Subcutaneous QHS  . fentaNYL  100 mcg Intravenous Once  . insulin aspart  0-15 Units Subcutaneous 6 times per day  . isosorbide mononitrate  60 mg Oral Daily  . labetalol  100 mg Oral BID  . levofloxacin (LEVAQUIN) IV  500 mg Intravenous Q24H  . loratadine  10 mg Oral Daily  . metoCLOPramide (REGLAN) injection  10 mg Intravenous 4 times per day  . midazolam  2 mg Intravenous Once  . pantoprazole (PROTONIX) IV  40 mg Intravenous Q12H   Physical Exam: Filed Vitals:   02/03/14 1800  BP: 152/97  Pulse: 73  Temp:   Resp: 16     General: intubated, no sedation but  non-responsive HEENT: OP clear, mucus membranes moist  SKIN: warm, dry. No rashes.  Neuro: Non responsive. Pupils fixed and dilated Neck: No JVD, no carotid bruits, no thyromegaly, no lymphadenopathy.  Lungs:Clear bilaterally, no wheezes, rhonci, crackles  Cardiovascular: Regular rate and rhythm. No murmurs, gallops or rubs.  Abdomen:Soft. Bowel sounds present. Non-tender.  Extremities: No lower extremity edema.  Labs:   Lab Results  Component Value Date   WBC 7.2 01/28/2014   HGB 10.2* 02/03/2014   HCT 30.0* 02/03/2014   MCV 87.3 01/28/2014   PLT 338 01/28/2014     Recent Labs Lab 01/30/2014 0550 02/03/14 1710  NA 137 138  K 3.4* 3.0*  CL 98 98  CO2 26  --   BUN 9 10  CREATININE 1.13* 1.70*  CALCIUM 8.9  --   GLUCOSE 147* 317*     EKG: Pending.   Tele shows sinus  ASSESSMENT AND PLAN:   1. Cardiac arrest: Pt has had prolonged hospitalization due to GI distress with poor po intake, N/V.  Recent admission for osteomyelitis. PEA arrest today. This is likely not driven by ischemia or a primary cardiac event. The differential more likely favors PE, intracranial event or sepsis. Her pupils are fixed and dilated at this time.She was not on telemetry on the floor and her down time is unknown. Her prognosis is very poor. No cardiac recommendations at this time. Would get echo tomorrow if she has any meaningful neurological response. Continue support per PCCM for now.   SignedVerne Carrow 02/03/2014, 6:31 PM

## 2014-02-03 NOTE — Progress Notes (Signed)
Entered patient room and found patient slumped over right arm of chair. Patient without respirations or pulse. Code blue called,patient placed in bed,CPR begun. See code sheet. Family called by chaplain. Report given to 2 [facility] at bedside. All belongings,meds and chart taken to 2 [facility].

## 2014-02-03 NOTE — Consult Note (Signed)
PULMONARY / CRITICAL CARE MEDICINE   Name: Jennifer [name] MRN: [id number] DOB: 1961-08-12    ADMISSION DATE:  01/15/2014 CONSULTATION DATE: 02/03/2014  REFERRING MD :  Resident  CHIEF COMPLAINT:  Heart stopped  BRIEF PATIENT DESCRIPTION:  53 yo female admitted with nausea, vomiting, diarrhea, chills, abdominal pain likely from DM gastroparesis.  She has hx of multiple amputations >> had Rt 5th metatarsal amputation recently for osteomyelitis.  She developed PEA arrest 3/24, and transferred to ICU.  SIGNIFICANT EVENTS: 3/14 Admit 3/22 GI consult 3/24 PEA arrest >> transfer to ICU  STUDIES:  3/14 CT abd/pelvis >> high density material in stomach, medium density material in gallbladder, fibroid uterus 3/17 Abd u/s >> sludge ball in gallbladder 3/19 Gastric emptying study >> abnormal gastric emptying at 2 hrs 3/23 EGD >> severe reflux esophagitis  LINES / TUBES: ETT 3/24 >>  L IJ TLC 3/24>>> R radial a-line 3/24>>>  CULTURES: Blood 3/14 >> negative Urine 3/14 >> Klebsiella   ANTIBIOTICS: Levaquin >>   HISTORY OF PRESENT ILLNESS:   53 yo female admitted with nausea, vomiting, diarrhea, chills, abdominal pain likely from DM gastroparesis.  She has hx of multiple amputations >> had Rt 5th metatarsal amputation recently for osteomyelitis.  She had EGD 3/23 which showed reflux esophagitis.  She had been on reglan.  She developed PEA arrest 3/24, and transferred to ICU.  PAST MEDICAL HISTORY :  Past Medical History  Diagnosis Date  . Diabetes mellitus   . Ulcer of lower limb, unspecified     ulcer on top of foot (right)  . Slow transit constipation   . Disorder of bone and cartilage, unspecified   . Unspecified essential hypertension   . Orthostatic hypotension   . Reflux esophagitis   . Gastroparesis   . Irritable bowel syndrome   . Flatulence, eructation, and gas pain   . Complication of anesthesia   . PONV (postoperative nausea and vomiting)    Past Surgical History   Procedure Laterality Date  . Esophagogastroduodenoscopy  08/11/2012    Procedure: ESOPHAGOGASTRODUODENOSCOPY (EGD);  Surgeon: Shirley FriarVincent C. Schooler, MD;  [facility];  Service: Endoscopy;  Laterality: N/A;  . Tubal ligation    . Toe amputation Left 11/09/2013    partial amputation of great left toe   . Amputation Right 11/07/2013    Procedure: AMPUTATION DIGIT - Right great toe;  Surgeon: Cheral AlmasNaiping Michael Xu, MD;  [facility];  Service: Orthopedics;  Laterality: Right;  . Amputation Left 11/09/2013    Procedure: AMPUTATION DIGIT - left great toe;  Surgeon: Cheral AlmasNaiping Michael Xu, MD;  [facility];  Service: Orthopedics;  Laterality: Left;  . Amputation Right 01/13/2014    Procedure: AMPUTATION RIGHT FIFTH TOE AND RIGHT SECOND TOE;  Surgeon: Valeria BatmanPeter W Whitfield, MD;  [facility];  Service: Orthopedics;  Laterality: Right;  . Esophagogastroduodenoscopy N/A 02/04/2014    Procedure: ESOPHAGOGASTRODUODENOSCOPY (EGD);  Surgeon: Willis ModenaWilliam Outlaw, MD;  [facility];  Service: Endoscopy;  Laterality: N/A;   Prior to Admission medications   Medication Sig Start Date End Date Taking? Authorizing Provider  insulin aspart (NOVOLOG) 100 UNIT/ML injection Inject 3-15 Units into the skin 3 (three) times daily before meals. *per sliding scale*   Yes Historical Provider, MD  insulin glargine (LANTUS) 100 UNIT/ML injection Inject 10 Units into the skin at bedtime.   Yes Historical Provider, MD  oxyCODONE-acetaminophen (PERCOCET/ROXICET) 5-325 MG per tablet Take 1-2 tablets by mouth every 4 (four) hours as needed for severe  pain. 01/15/14  Yes Jacqualine Code, PA-C   No Known Allergies  FAMILY HISTORY:  Family History  Problem Relation Age of Onset  . Diabetes Mother   . Alzheimer's disease Father    SOCIAL HISTORY:  reports that she has never smoked. She has never used smokeless tobacco. She reports that she does not drink alcohol or use illicit drugs.  REVIEW OF SYSTEMS:  Unattainable,  patient is sedated and intubated.  SUBJECTIVE:   VITAL SIGNS: Temp:  [98 F (36.7 C)-99.4 F (37.4 C)] 98.1 F (36.7 C) (03/24 1205) Pulse Rate:  [77-92] 88 (03/24 1205) Resp:  [18] 18 (03/24 1205) BP: (114-169)/(68-85) 142/78 mmHg (03/24 1205) SpO2:  [97 %-100 %] 97 % (03/24 1205) HEMODYNAMICS:   VENTILATOR SETTINGS:   INTAKE / OUTPUT: Intake/Output     03/23 0701 - 03/24 0700 03/24 0701 - 03/25 0700   P.O. 1380 360   I.V. (mL/kg) 200 (3.2)    Other     IV Piggyback     Total Intake(mL/kg) 1580 (25.5) 360 (5.8)   Urine (mL/kg/hr) 7202 (4.9)    Emesis/NG output 200 (0.1)    Total Output 7402     Net -5822 +360          PHYSICAL EXAMINATION: General:  Chronically ill appearing female, unresponsive. Neuro:  Pupils fixed and dilated, no dolls eye and no gag. HEENT:  [place]/AT, pupils fixed and dilated.  MMM. Cardiovascular:  RRR, Nl S1/S2, -M/R/G. Lungs:  Coarse BS diffusely. Abdomen:  Soft, NT, ND and +BS. Musculoskeletal:  Amputation on right foot, wound clean. Skin:  Intact.  LABS:  CBC  Recent Labs Lab 01/28/14 0547  WBC 7.2  HGB 10.1*  HCT 29.0*  PLT 338   Coag's No results found for this basename: APTT, INR,  in the last 168 hours BMET  Recent Labs Lab 01/31/14 0615 02/01/14 0515 2014-03-03 0550  NA 141 139 137  K 3.1* 3.5* 3.4*  CL 100 98 98  CO2 28 27 26   BUN 8 7 9   CREATININE 1.22* 1.08 1.13*  GLUCOSE 119* 143* 147*   Electrolytes  Recent Labs Lab 01/29/14 0530 01/30/14 0500 01/31/14 0615 02/01/14 0515 03/03/2014 0550  CALCIUM 9.1 8.8 9.2 9.2 8.9  MG 1.2* 2.2  --   --   --    Sepsis Markers No results found for this basename: LATICACIDVEN, PROCALCITON, O2SATVEN,  in the last 168 hours ABG No results found for this basename: PHART, PCO2ART, PO2ART,  in the last 168 hours Liver Enzymes No results found for this basename: AST, ALT, ALKPHOS, BILITOT, ALBUMIN,  in the last 168 hours Cardiac Enzymes No results found for this basename:  TROPONINI, PROBNP,  in the last 168 hours Glucose  Recent Labs Lab 03-Mar-2014 1616 03-03-14 2137 02/03/14 0026 02/03/14 0416 02/03/14 0833 02/03/14 1204  GLUCAP 138* 167* 163* 151* 112* 155*   Imaging Dg Abd 2 Views  02/03/2014   CLINICAL DATA:  Nausea, vomiting and abdominal pain.  EXAM: ABDOMEN - 2 VIEW  COMPARISON:  CT, 01/26/2014.  Radiographs, 08/18/2012  FINDINGS: Mild increased stool burden in the colon. No obstruction. No free air. No generalized adynamic ileus.  Soft tissues are unremarkable. No renal or ureteral stones are evident.  There is a fracture of the posterior left tenth rib. This could be recent or chronic.  IMPRESSION: 1. No acute findings within the abdomen or pelvis. 2. Fracture the posterior left tenth rib of unclear chronicity. 3. Mild increased stool burden in  the colon.   Electronically Signed   By: Amie Portland M.D.   On: 02/03/2014 12:28   CXR: Pending.  ASSESSMENT / PLAN:  PULMONARY A: Acute respiratory failure 2nd to PEA arrest P:   - Full vent support - F/u CXR, ABG - Adjust vent to ABG. - Lower ext dopplers.  CARDIOVASCULAR A:  PEA arrest, DDx includes intracranial event, PE vs primary cardiac event. Hx of HTN. P:  - Monitor hemodynamics. - Add heparin gtt for possible PE if CT head negative. - Hold anti-HTN meds for now. - Pressors for BP support. - Cards consultation called.  RENAL A:   Acute kidney injury. P:   - Monitor renal fx, urine outpt, electrolytes. - Lytes in AM. - Replace electrolytes as indicated.  GASTROINTESTINAL A:   DM gastroparesis with reflux esophagitis. P:   - Protonix - NPO - Keep HOB elevated > 30 degrees  HEMATOLOGIC A:   Anemia of chronic disease. P:  - F/u CBC  INFECTIOUS A:   Osteomyelitis Rt foot. Klebsiella UTI on admission. P:   - Levaquin, finish course.  ENDOCRINE A:   Difficult to control DM.   P:   - SSI.  NEUROLOGIC A:   Acute encephalopathy 2nd to PE arrest. P:   - F/u  CT head.  I have personally obtained a history, examined the patient, evaluated laboratory and imaging results, formulated the assessment and plan and placed orders.  CRITICAL CARE: The patient is critically ill with multiple organ systems failure and requires high complexity decision making for assessment and support, frequent evaluation and titration of therapies, application of advanced monitoring technologies and extensive interpretation of multiple databases. Critical Care Time devoted to patient care services described in this note is 45 minutes.   Alyson Reedy, M.D. [facility] Pulmonary/Critical Care Medicine. Pager: [phone]. After hours pager: [phone].

## 2014-02-03 NOTE — Code Documentation (Signed)
  Patient Name: Jennifer [name]   MRN: [id number]   Date of Birth/ Sex: [birth year] , female      Admission Date: 2014/10/25  Attending Provider: Alyson ReedyWesam G [name], MD  Primary Diagnosis: <principal problem not specified>   Indication: Pt was in her usual state of health until this PM, when she was noted to be unresponsive with PEA. Admitted for pancreatitis, had EGD today. No known cardiac history. Unresponsive for unclear amount of time.  Code blue was subsequently called. At the time of arrival on scene, ACLS protocol was underway.   Technical Description:  - CPR performance duration:  15  minutes  - Was defibrillation or cardioversion used? No   - Was external pacer placed? Yes  - Was patient intubated pre/post CPR? Yes   Medications Administered: Y = Yes; Blank = No Amiodarone    Atropine    Calcium    Epinephrine  Y  Lidocaine    Magnesium    Norepinephrine  drip  Phenylephrine    Sodium bicarbonate  Y  Vasopressin     Post CPR evaluation:  - Final Status - Was patient successfully resuscitated ? Yes Patient was still and unresponsive following resuscitation. Possible anoxic brain injury vs. stroke. - What is current rhythm? Sinus tachycardia - What is current hemodynamic status? Stable, BP 90/60  Miscellaneous Information:  - Labs sent, including: BMET, CBC, troponin  - Primary team notified?  Yes  - Family Notified? unsure  - Additional notes/ transfer status: Transferred to 2 heart for cooling per Dr. Alexia FreestoneYacoub     Elena Adamo, MD  02/03/2014, 5:17 PM

## 2014-02-03 NOTE — Anesthesia Preprocedure Evaluation (Signed)
Anesthesia Evaluation    Airway       Dental   Pulmonary          Cardiovascular hypertension,     Neuro/Psych    GI/Hepatic   Endo/Other  diabetes  Renal/GU      Musculoskeletal   Abdominal   Peds  Hematology   Anesthesia Other Findings   Reproductive/Obstetrics                           Anesthesia Physical Anesthesia Plan  ASA: IV and emergent  Anesthesia Plan:    Post-op Pain Management:    Induction:   Airway Management Planned: Oral ETT  Additional Equipment:   Intra-op Plan:   Post-operative Plan: Post-operative intubation/ventilation  Informed Consent:   Only emergency history available  Plan Discussed with: CRNA  Anesthesia Plan Comments:         Anesthesia Quick Evaluation

## 2014-02-03 NOTE — Progress Notes (Signed)
ANTICOAGULATION CONSULT NOTE - Initial Consult  Pharmacy Consult for heparin Indication: chest pain/ACS  No Known Allergies  Patient Measurements: Height: 5\' 6"  (167.6 cm) Weight: 136 lb 6.4 oz (61.871 kg) IBW/kg (Calculated) : 59.3 Heparin Dosing Weight: 61.9 kg  Vital Signs: Temp: 98.1 F (36.7 C) (03/24 1205) Temp src: Oral (03/24 1205) BP: 152/97 mmHg (03/24 1800) Pulse Rate: 73 (03/24 1800)  Labs:  Recent Labs  02/01/2014 0550 02/03/14 1710 02/03/14 1713  HGB  --  10.2*  --   HCT  --  30.0*  --   CREATININE 1.13* 1.70* 1.54*  TROPONINI  --   --  <0.30    Estimated Creatinine Clearance: 40.9 ml/min (by C-G formula based on Cr of 1.54).   Medical History: Past Medical History  Diagnosis Date  . Diabetes mellitus   . Ulcer of lower limb, unspecified     ulcer on top of foot (right)  . Slow transit constipation   . Disorder of bone and cartilage, unspecified   . Unspecified essential hypertension   . Orthostatic hypotension   . Reflux esophagitis   . Gastroparesis   . Irritable bowel syndrome   . Flatulence, eructation, and gas pain   . Complication of anesthesia   . PONV (postoperative nausea and vomiting)     Medications:  Scheduled:  . sodium chloride  2,000 mL Intravenous Once  . sodium chloride  2,000 mL Intravenous Once  . artificial tears  1 application Both Eyes 3 times per day  . fentaNYL  100 mcg Intravenous Once  . insulin aspart  0-15 Units Subcutaneous 6 times per day  . levofloxacin (LEVAQUIN) IV  500 mg Intravenous Q24H  . midazolam  2 mg Intravenous Once  . pantoprazole (PROTONIX) IV  40 mg Intravenous Q12H    Assessment: 53 yo female admitted with N/V/diarrhea on 01/19/2014 who was in stable condition earlier today until she had a PEA arrest.  Unknown downtime as she was not on telemetry.  Currently hypotensive and non-responsive on pressors.  Starting hypothermia protocol, and pharmacy asked to begin IV heparin as possible  thrombo-embolic cause of PEA arrest.  Last CBC 3/17, H/H slightly low but otherwise WNL.  Goal of Therapy:  Heparin level 0.3-0.7 units/ml Monitor platelets by anticoagulation protocol: Yes   Plan:  1. CBC now. 2. Start heparin gtt with bolus of 1800 units IV x 1. 3. Then start heparin gtt at rate of 500 units/hr. 4. Check heparin level 6 hrs after gtt starts. 5. Daily heparin level and CBC, but will need more frequent heparin levels once rewarming begins.  Tad MooreJessica Jermani Eberlein, Pharm D, BCPS  Clinical Pharmacist Pager [phone]  02/03/2014 7:38 PM

## 2014-02-03 NOTE — Procedures (Signed)
CPR  Code blue called for PEA arrest.  Code x15 minutes.  ACLS protocol followed.  Patient successfully resuscitated.  See code note.  Alyson ReedyWesam G. [name], M.D. [facility] Pulmonary/Critical Care Medicine. Pager: [phone]. After hours pager: [phone].

## 2014-02-03 NOTE — Procedures (Signed)
Central Venous Catheter Insertion Procedure Note Jennifer [name] [id number] 03/06/1961  Procedure: Insertion of Central Venous Catheter Indications: Assessment of intravascular volume, Drug and/or fluid administration and Frequent blood sampling  Procedure Details Consent: Unable to obtain consent because of emergent medical necessity. Time Out: Verified patient identification, verified procedure, site/side was marked, verified correct patient position, special equipment/implants available, medications/allergies/relevent history reviewed, required imaging and test results available.  Performed  Maximum sterile technique was used including antiseptics, cap, gloves, gown, hand hygiene, mask and sheet. Skin prep: Chlorhexidine; local anesthetic administered A antimicrobial bonded/coated triple lumen catheter was placed in the left internal jugular vein using the Seldinger technique.  Evaluation Blood flow good Complications: No apparent complications Patient did tolerate procedure well. Chest X-ray ordered to verify placement.  CXR: pending.  U/S used in placement.  Jennifer [name] 02/03/2014, 5:25 PM

## 2014-02-04 ENCOUNTER — Ambulatory Visit: Payer: BC Managed Care – PPO | Admitting: Internal Medicine

## 2014-02-04 ENCOUNTER — Inpatient Hospital Stay (HOSPITAL_COMMUNITY): Payer: BC Managed Care – PPO

## 2014-02-04 DIAGNOSIS — L97509 Non-pressure chronic ulcer of other part of unspecified foot with unspecified severity: Secondary | ICD-10-CM

## 2014-02-04 DIAGNOSIS — R748 Abnormal levels of other serum enzymes: Secondary | ICD-10-CM

## 2014-02-04 DIAGNOSIS — N19 Unspecified kidney failure: Secondary | ICD-10-CM

## 2014-02-04 LAB — HEPARIN LEVEL (UNFRACTIONATED)
HEPARIN UNFRACTIONATED: 0.23 [IU]/mL — AB (ref 0.30–0.70)
Heparin Unfractionated: 0.39 IU/mL (ref 0.30–0.70)
Heparin Unfractionated: 0.53 IU/mL (ref 0.30–0.70)

## 2014-02-04 LAB — BLOOD GAS, ARTERIAL
Acid-base deficit: 0.1 mmol/L (ref 0.0–2.0)
BICARBONATE: 22.7 meq/L (ref 20.0–24.0)
FIO2: 0.4 %
MECHVT: 430 mL
O2 Saturation: 100 %
PEEP/CPAP: 5 cmH2O
PO2 ART: 226 mmHg — AB (ref 80.0–100.0)
Patient temperature: 91.4
RATE: 16 resp/min
TCO2: 23.6 mmol/L (ref 0–100)
pCO2 arterial: 23.7 mmHg — ABNORMAL LOW (ref 35.0–45.0)
pH, Arterial: 7.569 — ABNORMAL HIGH (ref 7.350–7.450)

## 2014-02-04 LAB — BASIC METABOLIC PANEL
BUN: 17 mg/dL (ref 6–23)
BUN: 17 mg/dL (ref 6–23)
BUN: 17 mg/dL (ref 6–23)
BUN: 18 mg/dL (ref 6–23)
BUN: 18 mg/dL (ref 6–23)
CALCIUM: 9 mg/dL (ref 8.4–10.5)
CALCIUM: 8.8 mg/dL (ref 8.4–10.5)
CHLORIDE: 103 meq/L (ref 96–112)
CHLORIDE: 102 meq/L (ref 96–112)
CO2: 21 meq/L (ref 19–32)
CO2: 21 mEq/L (ref 19–32)
CO2: 23 mEq/L (ref 19–32)
CO2: 22 meq/L (ref 19–32)
CO2: 21 meq/L (ref 19–32)
CREATININE: 1.64 mg/dL — AB (ref 0.50–1.10)
CREATININE: 1.71 mg/dL — AB (ref 0.50–1.10)
CREATININE: 1.76 mg/dL — AB (ref 0.50–1.10)
Calcium: 9.2 mg/dL (ref 8.4–10.5)
Calcium: 8.9 mg/dL (ref 8.4–10.5)
Calcium: 8.7 mg/dL (ref 8.4–10.5)
Chloride: 102 mEq/L (ref 96–112)
Chloride: 102 mEq/L (ref 96–112)
Chloride: 104 mEq/L (ref 96–112)
Creatinine, Ser: 1.71 mg/dL — ABNORMAL HIGH (ref 0.50–1.10)
Creatinine, Ser: 1.89 mg/dL — ABNORMAL HIGH (ref 0.50–1.10)
GFR calc Af Amer: 41 mL/min — ABNORMAL LOW (ref >90)
GFR calc Af Amer: 34 mL/min — ABNORMAL LOW (ref >90)
GFR calc non Af Amer: 32 mL/min — ABNORMAL LOW (ref >90)
GFR calc non Af Amer: 30 mL/min — ABNORMAL LOW (ref >90)
GFR, EST AFRICAN AMERICAN: 39 mL/min — AB (ref >90)
GFR, EST AFRICAN AMERICAN: 39 mL/min — AB (ref >90)
GFR, EST AFRICAN AMERICAN: 38 mL/min — AB (ref >90)
GFR, EST NON AFRICAN AMERICAN: 35 mL/min — AB (ref >90)
GFR, EST NON AFRICAN AMERICAN: 34 mL/min — AB (ref >90)
GFR, EST NON AFRICAN AMERICAN: 34 mL/min — AB (ref >90)
GLUCOSE: 254 mg/dL — AB (ref 70–99)
Glucose, Bld: 172 mg/dL — ABNORMAL HIGH (ref 70–99)
Glucose, Bld: 146 mg/dL — ABNORMAL HIGH (ref 70–99)
Glucose, Bld: 147 mg/dL — ABNORMAL HIGH (ref 70–99)
Glucose, Bld: 146 mg/dL — ABNORMAL HIGH (ref 70–99)
POTASSIUM: 3.8 meq/L (ref 3.7–5.3)
POTASSIUM: 3.2 meq/L — AB (ref 3.7–5.3)
Potassium: 3.1 mEq/L — ABNORMAL LOW (ref 3.7–5.3)
Potassium: 2.9 mEq/L — CL (ref 3.7–5.3)
Potassium: 3.4 mEq/L — ABNORMAL LOW (ref 3.7–5.3)
SODIUM: 141 meq/L (ref 137–147)
Sodium: 140 mEq/L (ref 137–147)
Sodium: 140 mEq/L (ref 137–147)
Sodium: 142 mEq/L (ref 137–147)
Sodium: 141 mEq/L (ref 137–147)

## 2014-02-04 LAB — MAGNESIUM: Magnesium: 1.5 mg/dL (ref 1.5–2.5)

## 2014-02-04 LAB — GLUCOSE, CAPILLARY
GLUCOSE-CAPILLARY: 310 mg/dL — AB (ref 70–99)
GLUCOSE-CAPILLARY: 222 mg/dL — AB (ref 70–99)
GLUCOSE-CAPILLARY: 148 mg/dL — AB (ref 70–99)
GLUCOSE-CAPILLARY: 76 mg/dL (ref 70–99)
GLUCOSE-CAPILLARY: 129 mg/dL — AB (ref 70–99)
Glucose-Capillary: 286 mg/dL — ABNORMAL HIGH (ref 70–99)
Glucose-Capillary: 243 mg/dL — ABNORMAL HIGH (ref 70–99)
Glucose-Capillary: 175 mg/dL — ABNORMAL HIGH (ref 70–99)
Glucose-Capillary: 99 mg/dL (ref 70–99)
Glucose-Capillary: 85 mg/dL (ref 70–99)
Glucose-Capillary: 124 mg/dL — ABNORMAL HIGH (ref 70–99)
Glucose-Capillary: 140 mg/dL — ABNORMAL HIGH (ref 70–99)

## 2014-02-04 LAB — TROPONIN I
TROPONIN I: 0.37 ng/mL — AB (ref <0.30)
Troponin I: <0.3 ng/mL (ref <0.30)

## 2014-02-04 LAB — POCT I-STAT 3, ART BLOOD GAS (G3+)
Acid-base deficit: 1 mmol/L (ref 0.0–2.0)
Bicarbonate: 23.3 mEq/L (ref 20.0–24.0)
O2 Saturation: 100 %
PCO2 ART: 31.5 mmHg — AB (ref 35.0–45.0)
PH ART: 7.459 — AB (ref 7.350–7.450)
PO2 ART: 154 mmHg — AB (ref 80.0–100.0)
Patient temperature: 33
TCO2: 24 mmol/L (ref 0–100)

## 2014-02-04 LAB — MRSA PCR SCREENING: MRSA BY PCR: NEGATIVE

## 2014-02-04 LAB — POCT I-STAT, CHEM 8
BUN: 15 mg/dL (ref 6–23)
CHLORIDE: 106 meq/L (ref 96–112)
Calcium, Ion: 1.24 mmol/L — ABNORMAL HIGH (ref 1.12–1.23)
Creatinine, Ser: 1.8 mg/dL — ABNORMAL HIGH (ref 0.50–1.10)
Glucose, Bld: 79 mg/dL (ref 70–99)
HCT: 29 % — ABNORMAL LOW (ref 36.0–46.0)
HEMOGLOBIN: 9.9 g/dL — AB (ref 12.0–15.0)
POTASSIUM: 3 meq/L — AB (ref 3.7–5.3)
SODIUM: 142 meq/L (ref 137–147)
TCO2: 24 mmol/L (ref 0–100)

## 2014-02-04 LAB — PROTIME-INR
INR: 1.25 (ref 0.00–1.49)
Prothrombin Time: 15.4 seconds — ABNORMAL HIGH (ref 11.6–15.2)

## 2014-02-04 LAB — CLOSTRIDIUM DIFFICILE BY PCR: Toxigenic C. Difficile by PCR: NEGATIVE

## 2014-02-04 LAB — APTT: aPTT: 38 seconds — ABNORMAL HIGH (ref 24–37)

## 2014-02-04 LAB — PHOSPHORUS: Phosphorus: 1 mg/dL — CL (ref 2.3–4.6)

## 2014-02-04 MED ORDER — INSULIN ASPART 100 UNIT/ML ~~LOC~~ SOLN
2.0000 [IU] | SUBCUTANEOUS | Status: DC
  Administered 2014-02-04 – 2014-02-06 (×6): 2 [IU] via SUBCUTANEOUS
  Administered 2014-02-06: 4 [IU] via SUBCUTANEOUS
  Administered 2014-02-06 – 2014-02-07 (×3): 2 [IU] via SUBCUTANEOUS
  Administered 2014-02-07: 4 [IU] via SUBCUTANEOUS
  Administered 2014-02-07 (×2): 2 [IU] via SUBCUTANEOUS
  Administered 2014-02-08 (×2): 4 [IU] via SUBCUTANEOUS
  Administered 2014-02-08: 2 [IU] via SUBCUTANEOUS

## 2014-02-04 MED ORDER — MAGNESIUM SULFATE 40 MG/ML IJ SOLN
2.0000 g | Freq: Once | INTRAMUSCULAR | Status: AC
  Administered 2014-02-04: 2 g via INTRAVENOUS
  Filled 2014-02-04: qty 50

## 2014-02-04 MED ORDER — VITAL AF 1.2 CAL PO LIQD
1000.0000 mL | ORAL | Status: DC
  Administered 2014-02-05 – 2014-02-07 (×3): 1000 mL
  Filled 2014-02-04 (×6): qty 1000

## 2014-02-04 MED ORDER — DEXTROSE 10 % IV SOLN: INTRAVENOUS | Status: DC | PRN

## 2014-02-04 MED ORDER — POTASSIUM CHLORIDE 10 MEQ/50ML IV SOLN
10.0000 meq | INTRAVENOUS | Status: AC
  Administered 2014-02-04 (×6): 10 meq via INTRAVENOUS
  Filled 2014-02-04: qty 50

## 2014-02-04 MED ORDER — SODIUM PHOSPHATE 3 MMOLE/ML IV SOLN
30.0000 mmol | Freq: Once | INTRAVENOUS | Status: AC
  Administered 2014-02-04: 30 mmol via INTRAVENOUS
  Filled 2014-02-04: qty 10

## 2014-02-04 MED ORDER — INSULIN GLARGINE 100 UNIT/ML ~~LOC~~ SOLN
10.0000 [IU] | SUBCUTANEOUS | Status: DC
  Administered 2014-02-04 – 2014-02-07 (×4): 10 [IU] via SUBCUTANEOUS
  Filled 2014-02-04 (×5): qty 0.1

## 2014-02-04 NOTE — Progress Notes (Signed)
PULMONARY / CRITICAL CARE MEDICINE   Name: Jennifer [name] MRN: [id number] DOB: Apr 10, 1961    ADMISSION DATE:  02/02/2014 CONSULTATION DATE: 02/03/2014  REFERRING MD :  TRH  CHIEF COMPLAINT:  Heart stopped  BRIEF PATIENT DESCRIPTION:  53 yo female admitted with nausea, vomiting, diarrhea, chills, abdominal pain likely from DM gastroparesis.  She has hx of multiple amputations >> had Rt 5th metatarsal amputation recently for osteomyelitis.  She developed PEA arrest 3/24, and transferred to ICU.  SIGNIFICANT EVENTS: 3/14 Admit 3/22 GI consult 3/24 PEA arrest >> transfer to ICU  STUDIES:  3/14 CT abd/pelvis >> high density material in stomach, medium density material in gallbladder, fibroid uterus 3/17 Abd u/s >> sludge ball in gallbladder 3/19 Gastric emptying study >> abnormal gastric emptying at 2 hrs 3/23 EGD >> severe reflux esophagitis  LINES / TUBES: ETT 3/24 >>  L IJ TLC 3/24>>> R radial a-line 3/24>>>  CULTURES: Blood 3/14 >> negative Urine 3/14 >> Klebsiella   ANTIBIOTICS: Levaquin >>   SUBJECTIVE: Cardiac arrest yesterday, no events overnight, sedated and paralyzed.  VITAL SIGNS: Temp:  [90.3 F (32.4 C)-98.1 F (36.7 C)] 91.4 F (33 C) (03/25 0600) Pulse Rate:  [52-91] 52 (03/25 0819) Resp:  [11-20] 12 (03/25 0819) BP: (62-152)/(41-97) 138/92 mmHg (03/25 0400) SpO2:  [97 %-100 %] 100 % (03/25 0819) Arterial Line BP: (110-220)/(52-102) 124/72 mmHg (03/25 0630) FiO2 (%):  [30 %-100 %] 30 % (03/25 0819) Weight:  [124 lb 9 oz (56.5 kg)] 124 lb 9 oz (56.5 kg) (03/24 2045) HEMODYNAMICS: CVP:  [0 mmHg-9 mmHg] 0 mmHg VENTILATOR SETTINGS: Vent Mode:  [-] PRVC FiO2 (%):  [30 %-100 %] 30 % Set Rate:  [12 bmp-16 bmp] 12 bmp Vt Set:  [430 mL-520 mL] 430 mL PEEP:  [5 cmH20] 5 cmH20 Plateau Pressure:  [12 cmH20-16 cmH20] 15 cmH20 INTAKE / OUTPUT: Intake/Output     03/24 0701 - 03/25 0700 03/25 0701 - 03/26 0700   P.O. 360    I.V. (mL/kg) 873.3 (15.5)    IV  Piggyback 636    Total Intake(mL/kg) 1869.3 (33.1)    Urine (mL/kg/hr) 335 (0.2)    Emesis/NG output 100 (0.1)    Stool 3 (0)    Total Output 438     Net +1431.3            PHYSICAL EXAMINATION: General:  Chronically ill appearing female, unresponsive. Neuro:  Pupils fixed and dilated, no dolls eye and no gag. HEENT:  [place]/AT, pupils fixed and dilated.  MMM. Cardiovascular:  RRR, Nl S1/S2, -M/R/G. Lungs:  Coarse BS diffusely. Abdomen:  Soft, NT, ND and +BS. Musculoskeletal:  Amputation on right foot, wound clean. Skin:  Intact.  LABS:  CBC  Recent Labs Lab 02/03/14 1710 02/03/14 2055 02/03/14 2100  WBC  --  9.7  --   HGB 10.2* 10.4* 11.2*  HCT 30.0* 29.7* 33.0*  PLT  --  264  --    Coag's  Recent Labs Lab 02/03/14 2108 02/04/14 0420  APTT 25 38*  INR 1.22 1.25   BMET  Recent Labs Lab 02/03/14 2108 02/04/14 0209 02/04/14 0400  NA 139 140 140  K 2.7* 3.1* 2.9*  CL 100 102 102  CO2 20 21 21   BUN 16 17 17   CREATININE 1.68* 1.64* 1.71*  GLUCOSE 336* 254* 172*   Electrolytes  Recent Labs Lab 01/29/14 0530 01/30/14 0500  02/03/14 2108 02/04/14 0209 02/04/14 0400  CALCIUM 9.1 8.8  < > 8.7 9.0 9.2  MG 1.2* 2.2  --   --   --  1.5  PHOS  --   --   --   --   --  1.0*  < > = values in this interval not displayed. Sepsis Markers  Recent Labs Lab 02/03/14 1755  LATICACIDVEN 12.5*   ABG  Recent Labs Lab 02/03/14 1822 02/04/14 0429 02/04/14 0647  PHART 7.388 7.569* 7.459*  PCO2ART 33.9* 23.7* 31.5*  PO2ART 389.0* 226.0* 154.0*   Liver Enzymes No results found for this basename: AST, ALT, ALKPHOS, BILITOT, ALBUMIN,  in the last 168 hours Cardiac Enzymes  Recent Labs Lab 02/03/14 1713 02/03/14 2332 02/04/14 0420  TROPONINI <0.30 0.37* <0.30   Glucose  Recent Labs Lab 02/03/14 2316 02/04/14 0113 02/04/14 0203 02/04/14 0306 02/04/14 0407 02/04/14 0507  GLUCAP 310* 286* 243* 222* 175* 148*   Imaging Dg Chest Port 1  View  02/04/2014   CLINICAL DATA:  Shortness of breath and evaluate endotracheal tube.  EXAM: PORTABLE CHEST - 1 VIEW  COMPARISON:  DG CHEST 1V PORT dated 02/03/2014  FINDINGS: Endotracheal tube is now 3.5 cm above the carina. Nasogastric tube in the upper stomach. Patient now has defibrillator pads overlying the left side of the chest. There is a left jugular central venous catheter. Central line tip in the upper SVC region. Negative for a pneumothorax. Lungs are clear without airspace disease or edema.  IMPRESSION: The endotracheal tube has been repositioned and it is appropriately positioned above the carina.  No focal lung disease.   Electronically Signed   By: Richarda OverlieAdam  Henn M.D.   On: 02/04/2014 08:40   Dg Chest Port 1 View  02/03/2014   CLINICAL DATA:  Line placement  EXAM: PORTABLE CHEST - 1 VIEW  COMPARISON:  12/18/2013  FINDINGS: Right mainstem intubation, recommend retraction by 4 cm. Left IJ catheter, tip at the level of the distal SVC. No pneumothorax.  Clear lungs.  Normal heart size.  Mildly displaced anterolateral left third and fourth rib fractures.  Critical Value/emergent results were called by telephone at the time of interpretation on 02/03/2014 at 6:52 PM to RN [place], who verbally acknowledged these results.  IMPRESSION: 1. Right mainstem intubation. Recommend endotracheal tube retraction by 4 cm. 2. Left IJ catheter in good position.  No pneumothorax. 3. Left third and fourth rib fractures.   Electronically Signed   By: Tiburcio PeaJonathan  Watts M.D.   On: 02/03/2014 18:56   Dg Abd 2 Views  02/03/2014   CLINICAL DATA:  Nausea, vomiting and abdominal pain.  EXAM: ABDOMEN - 2 VIEW  COMPARISON:  CT, 01/25/2014.  Radiographs, 08/18/2012  FINDINGS: Mild increased stool burden in the colon. No obstruction. No free air. No generalized adynamic ileus.  Soft tissues are unremarkable. No renal or ureteral stones are evident.  There is a fracture of the posterior left tenth rib. This could be recent or chronic.   IMPRESSION: 1. No acute findings within the abdomen or pelvis. 2. Fracture the posterior left tenth rib of unclear chronicity. 3. Mild increased stool burden in the colon.   Electronically Signed   By: Amie Portlandavid  Ormond M.D.   On: 02/03/2014 12:28   Ct Portable Head W/o Cm  02/03/2014   CLINICAL DATA:  Unresponsive.  EXAM: CT HEAD WITHOUT CONTRAST  TECHNIQUE: Contiguous axial images were obtained from the base of the skull through the vertex without intravenous contrast.  COMPARISON:  None.  FINDINGS: The basal ganglia and insular cortex appear to be intact bilaterally. No  acute cortical infarct, hemorrhage, or mass lesion is present. There is significant artifactual no is on the scan due to portable technique. The ventricles are of normal size. No significant extra-axial fluid collection is present.  The patient is intubated. Minimal fluid is present in the left maxillary sinus. A small polyp or mucous retention cyst is present in the right maxillary sinus. Paranasal sinuses and mastoid air cells are clear. The osseous skull is intact.  IMPRESSION: 1. Gray-white differentiation is preserved without evidence for diffuse edema or ischemic insult. 2. There significant artifact due to portable technique. Follow-up CT scan or repeat CT scan within the department may prove useful for increased sensitivity to progressed ischemic disease if present.   Electronically Signed   By: Gennette Pac M.D.   On: 02/03/2014 19:05   CXR: Pending.  ASSESSMENT / PLAN:  PULMONARY A: Acute respiratory failure 2nd to PEA arrest P:   - Full vent support - F/u CXR, ABG - Adjust vent to ABG. - Lower ext dopplers pending.  CARDIOVASCULAR A:  PEA arrest, DDx includes intracranial event, PE vs primary cardiac event. Hx of HTN. P:  - Monitor hemodynamics. - Continue heparin for ?ACS and PE as possibilities for cardiac arrest. - Hold anti-HTN meds for now. - Pressors for BP support. - Cards consultation  appreciated.  RENAL A:   Acute kidney injury. P:   - Monitor renal fx, urine outpt, electrolytes. - Lytes in AM. - Replace electrolytes as indicated.  GASTROINTESTINAL A:   DM gastroparesis with reflux esophagitis. P:   - Protonix. - NPO while cooled. - Keep HOB elevated > 30 degrees.  HEMATOLOGIC A:   Anemia of chronic disease. P:  - F/u CBC  INFECTIOUS A:   Osteomyelitis Rt foot. Klebsiella UTI on admission. P:   - Levaquin, finish course.  ENDOCRINE A:   Difficult to control DM.   P:   - SSI.  NEUROLOGIC A:   Acute encephalopathy 2nd to PE arrest. P:   - F/u CT head.  No family bedside, will arrange for meeting in AM with family.  I have personally obtained a history, examined the patient, evaluated laboratory and imaging results, formulated the assessment and plan and placed orders.  CRITICAL CARE: The patient is critically ill with multiple organ systems failure and requires high complexity decision making for assessment and support, frequent evaluation and titration of therapies, application of advanced monitoring technologies and extensive interpretation of multiple databases. Critical Care Time devoted to patient care services described in this note is 35 minutes.   Alyson Reedy, M.D. [facility] Pulmonary/Critical Care Medicine. Pager: [phone]. After hours pager: [phone].

## 2014-02-04 NOTE — Progress Notes (Signed)
[facility] PROGRESS NOTE  Jennifer [name] ZOX:[id number] DOB: 03/01/1961 DOA: 01/27/2014 PCP: Oneal GroutPANDEY, MAHIMA, MD  Assessment/Plan: 1.Intractable nausea vomiting. Patient continued to have nausea vomiting. Given her history of poorly controlled diabetes mellitus with multiple complications, suspected secondary to diabetic gastroparesis  -Ultrasound with sludge ball within gallbladder, but no evidence of cholecystitis noted>> not likely the etiology of her intractable nausea vomiting especially at she denies any abdominal pain  -gastric emptying scan with gastroparesis  -no improvement this a.m. on PPI , reglan, and gastroparesis diet>> EGD 3/23 with severe lower esophageal esophagitis and PPI added   2.Gastroparesis in setting of uncontrolled diabetes mellitus. Dr. Vanessa BarbaraZamora recommended a NG-tube placement for decompression following admission the patient declined after attempts to place were unsuccessful.  -An ultrasound 3/17 with sludge ball within gallbladder but no evidence of cholecystitis reported,  -gastric emptying scan c/w gastroparesis  -continue Reglan 10 mg IV 4 times a day  -continue gastroparesis diet - continue full liquids for now and follow  -no improvement today on the PPI-continue  -Follow up on abdominal x-rays ordered per GI   3.Acute Kidney Failure. Likely secondary to prerenal azotemia stemming from repeated episodes, continue IV fluids.  -improved with hydration   4.Type 2 diabetes mellitus, poorly controlled. continue Accu-Cheks every 4 hours and cover with sliding scale insulin.   5.Osteomyelitis, status post amputation of right fifth metatarsal, right second metatarsal, performed on recent hospitalization. Continue Levaquin.  -noted Levaquin started on 3/13 per Dr. Cleophas DunkerWhitfield and to be continued for 30 days   6.Diarrhea. No further episodes of diarrhea since admission   7.Hypertension. Continue with when necessary hydralazine. Increasing clonidine dose    8.Hypokalemia. Secondary to GI losses  -Again replete K   9. Hypomagnesemia  -Repleted, mg level within normal limits   10. KLEB UTI  -continue levaquin   11.constipation  -Will add Dulcolax when necessary  -Follow up on abdominal x-rays ordered per GI       Code Status: Full Code  Family Communication: None at bedside  Disposition Plan: To home when medically ready     Procedure  CT head portable 02/03/2014 1. Gray-white differentiation is preserved without evidence for  diffuse edema or ischemic insult.  2. There significant artifact due to portable technique. Follow-up  CT scan or repeat CT scan within the department may prove useful for  increased sensitivity to progressed ischemic disease if present.   PCXR 02/03/2014 1. No acute findings within the abdomen or pelvis.  2. Fracture the posterior left tenth rib of unclear chronicity.  3. Mild increased stool burden in the colon.  Abdominal x-ray 02/03/2014 1. No acute findings within the abdomen or pelvis.  2. Fracture the posterior left tenth rib of unclear chronicity.  3. Mild increased stool burden in the colon.  Nuclear medicine gastric emptying 30/19/2015 Expected location of the stomach in the left upper quadrant.  Ingested meal empties the stomach gradually over the course of the  study with 55% retention at 60 min and 59% retention at 120 min  (normal retention less than 30% at a 120 min).  Abdominal ultrasound right upper quadrant 01/27/2014 1. No acute findings.  2. Sludge ball within the gallbladder  CT abdomen pelvis 01/21/2014  No acute abnormality   Antibiotics  Levofloxacin 500 mg daily 3/13>>     Consultation     HPI/Subjective: Jennifer [name] is a 53 y.o. BF PMHx diabetes type 2 uncontrolled with amputation of multiple metatarsals, neuropathic diabetic ulcer foot,  diabetic gastroparesis, hypertension, orthostatic hypotension, irritable bowel syndrome, Hx shock liver. S/P Ray  amputation of right little toe andTerminal Syme amputation, right second toe 01/13/2014 by Dr. Norlene Campbell (orthopedic surgery). Presented to [facility] ED with 2 days nausea, vomiting, diarrhea, chills/shakes, body aches. Has mild rhinorrhea also. Recent R fifth toe amputation and R 2nd toe distal tip amputation. Currently levaquin which Dr. Norlene Campbell started on 3/13 and patient was to remain on for 30 days. Moderate abdominal tenderness, no focal rebound or guarding. Patient's toes appear well, no signs of infection or drainage. Admission AG= 16. Currently positive nausea, positive lightheadedness, positive abdominal tenderness, negative chills/shakes   Objective: Filed Vitals:   02/04/14 0400 02/04/14 0500 02/04/14 0600 02/04/14 0630  BP: 138/92     Pulse: 54 91 55 54  Temp: 90.3 F (32.4 C) 90.9 F (32.7 C) 91.4 F (33 C)   TempSrc: Core (Comment) Core (Comment) Core (Comment)   Resp: 18 17 13 12   Height:      Weight:      SpO2: 100% 99% 100% 100%    Intake/Output Summary (Last 24 hours) at 02/04/14 0751 Last data filed at 02/04/14 0650  Gross per 24 hour  Intake 1869.31 ml  Output    438 ml  Net 1431.31 ml   Filed Weights   01/29/14 2126 01/31/14 2017 02/03/14 2045  Weight: 65 kg (143 lb 4.8 oz) 61.871 kg (136 lb 6.4 oz) 56.5 kg (124 lb 9 oz)    Exam: General: A./O. x4, moderate distress secondary to nausea and abdominal pain  Cardiovascular: Tachycardia, regular rhythm, negative murmurs rubs or gallops  Respiratory: Clear to ossification bilateral Abdomen: Diffuse abdominal tenderness to palpation, positive hypoactive bowel sounds  Skin: Negative lesions  Musculoskeletal: Right foot encompassed in a Una boot secondary to amputation metatarsals  Data Reviewed: Basic Metabolic Panel:  Recent Labs Lab 01/29/14 0530 01/30/14 0500  01/12/2014 0550  02/03/14 1713 02/03/14 2100 02/03/14 2108 02/04/14 0209 02/04/14 0400  NA 136* 138  < > 137  < > 141 140 139  140 140  K 3.0* 3.2*  < > 3.4*  < > 3.2* 2.7* 2.7* 3.1* 2.9*  CL 96 97  < > 98  < > 94* 101 100 102 102  CO2 27 26  < > 26  --  17*  --  20 21 21   GLUCOSE 141* 133*  < > 147*  < > 324* 365* 336* 254* 172*  BUN 10 10  < > 9  < > 10 13 16 17 17   CREATININE 1.34* 1.25*  < > 1.13*  < > 1.54* 1.90* 1.68* 1.64* 1.71*  CALCIUM 9.1 8.8  < > 8.9  --  9.0  --  8.7 9.0 9.2  MG 1.2* 2.2  --   --   --   --   --   --   --  1.5  PHOS  --   --   --   --   --   --   --   --   --  1.0*  < > = values in this interval not displayed. Liver Function Tests: No results found for this basename: AST, ALT, ALKPHOS, BILITOT, PROT, ALBUMIN,  in the last 168 hours No results found for this basename: LIPASE, AMYLASE,  in the last 168 hours No results found for this basename: AMMONIA,  in the last 168 hours CBC:  Recent Labs Lab 02/03/14 1710 02/03/14 2055 02/03/14 2100  WBC  --  9.7  --   HGB 10.2* 10.4* 11.2*  HCT 30.0* 29.7* 33.0*  MCV  --  86.6  --   PLT  --  264  --    Cardiac Enzymes:  Recent Labs Lab 02/03/14 1713 02/03/14 2332 02/04/14 0420  TROPONINI <0.30 0.37* <0.30   BNP (last 3 results)  Recent Labs  2014/02/04 1925  PROBNP 13.8   CBG:  Recent Labs Lab 02/04/14 0113 02/04/14 0203 02/04/14 0306 02/04/14 0407 02/04/14 0507  GLUCAP 286* 243* 222* 175* 148*    Recent Results (from the past 240 hour(s))  MRSA PCR SCREENING     Status: None   Collection Time    02/04/14  2:58 AM      Result Value Ref Range Status   MRSA by PCR NEGATIVE  NEGATIVE Final   Comment:            The GeneXpert MRSA Assay (FDA     approved for NASAL specimens     only), is one component of a     comprehensive MRSA colonization     surveillance program. It is not     intended to diagnose MRSA     infection nor to guide or     monitor treatment for     MRSA infections.     Studies: Dg Chest Port 1 View  02/03/2014   CLINICAL DATA:  Line placement  EXAM: PORTABLE CHEST - 1 VIEW  COMPARISON:   12/18/2013  FINDINGS: Right mainstem intubation, recommend retraction by 4 cm. Left IJ catheter, tip at the level of the distal SVC. No pneumothorax.  Clear lungs.  Normal heart size.  Mildly displaced anterolateral left third and fourth rib fractures.  Critical Value/emergent results were called by telephone at the time of interpretation on 02/03/2014 at 6:52 PM to RN [place], who verbally acknowledged these results.  IMPRESSION: 1. Right mainstem intubation. Recommend endotracheal tube retraction by 4 cm. 2. Left IJ catheter in good position.  No pneumothorax. 3. Left third and fourth rib fractures.   Electronically Signed   By: Tiburcio Pea M.D.   On: 02/03/2014 18:56   Dg Abd 2 Views  02/03/2014   CLINICAL DATA:  Nausea, vomiting and abdominal pain.  EXAM: ABDOMEN - 2 VIEW  COMPARISON:  CT, 02-04-14.  Radiographs, 08/18/2012  FINDINGS: Mild increased stool burden in the colon. No obstruction. No free air. No generalized adynamic ileus.  Soft tissues are unremarkable. No renal or ureteral stones are evident.  There is a fracture of the posterior left tenth rib. This could be recent or chronic.  IMPRESSION: 1. No acute findings within the abdomen or pelvis. 2. Fracture the posterior left tenth rib of unclear chronicity. 3. Mild increased stool burden in the colon.   Electronically Signed   By: Amie Portland M.D.   On: 02/03/2014 12:28   Ct Portable Head W/o Cm  02/03/2014   CLINICAL DATA:  Unresponsive.  EXAM: CT HEAD WITHOUT CONTRAST  TECHNIQUE: Contiguous axial images were obtained from the base of the skull through the vertex without intravenous contrast.  COMPARISON:  None.  FINDINGS: The basal ganglia and insular cortex appear to be intact bilaterally. No acute cortical infarct, hemorrhage, or mass lesion is present. There is significant artifactual no is on the scan due to portable technique. The ventricles are of normal size. No significant extra-axial fluid collection is present.  The patient is  intubated. Minimal fluid is present in the left  maxillary sinus. A small polyp or mucous retention cyst is present in the right maxillary sinus. Paranasal sinuses and mastoid air cells are clear. The osseous skull is intact.  IMPRESSION: 1. Gray-white differentiation is preserved without evidence for diffuse edema or ischemic insult. 2. There significant artifact due to portable technique. Follow-up CT scan or repeat CT scan within the department may prove useful for increased sensitivity to progressed ischemic disease if present.   Electronically Signed   By: Gennette Pac M.D.   On: 02/03/2014 19:05    Scheduled Meds: . antiseptic oral rinse  15 mL Mouth Rinse QID  . artificial tears  1 application Both Eyes 3 times per day  . chlorhexidine  15 mL Mouth Rinse BID  . levofloxacin (LEVAQUIN) IV  500 mg Intravenous Q24H  . pantoprazole (PROTONIX) IV  40 mg Intravenous Q12H  . potassium chloride  10 mEq Intravenous Q1 Hr x 6  . sodium phosphate  Dextrose 5% IVPB  30 mmol Intravenous Once   Continuous Infusions: . sodium chloride 20 mL/hr at 02/03/14 2132  . cisatracurium (NIMBEX) infusion 1 mcg/kg/min (02/04/14 0305)  . fentaNYL infusion INTRAVENOUS 50 mcg/hr (02/04/14 0305)  . heparin 600 Units/hr (02/04/14 0455)  . insulin (NOVOLIN-R) infusion 2.5 Units/hr (02/04/14 0035)  . midazolam (VERSED) infusion 2 mg/hr (02/04/14 0305)  . norepinephrine (LEVOPHED) Adult infusion 2.5 mcg/min (02/04/14 0500)    Active Problems:   Shock liver   Gastroparesis due to DM   Hypertension   Type II or unspecified type diabetes mellitus with unspecified complication, uncontrolled   Neuropathic diabetic ulcer of foot   History of amputation of lesser toe of right foot   Amputation of second toe, right, traumatic   Vomiting and diarrhea   Nausea   Acute renal failure   Cardiac arrest   Cardiogenic shock   Acute respiratory failure   Altered mental status    Time spent: 90 min    Bascom Biel,  J  [facility] Pager [phone]. If 7PM-7AM, please contact night-coverage at [email or web address], password [facility] 02/04/2014, 7:51 AM  LOS: 11 days

## 2014-02-04 NOTE — Progress Notes (Signed)
INITIAL NUTRITION ASSESSMENT  DOCUMENTATION CODES Per approved criteria  -Not Applicable   INTERVENTION: 1.  Enteral nutrition; Consider post-pyloric tube placement in pt with gastroparesis. initiate Vital 1.2 @ 20 mL/hr continuous trophic feed once pt re-warmed and paralytic weaned.  Will begin advancements after 24 hrs of established tolerance.   NUTRITION DIAGNOSIS: Inadequate oral intake related to inability to eat as evidenced by NPO, vent.   Monitor:  1.  Enteral nutrition; initiation with tolerance.  Pt to meet >/=90% estimated needs with nutrition support.  2.  Wt/wt change; monitor trends  Reason for Assessment: consult  53 y.o. female  Admitting Dx: PEA arrest  ASSESSMENT: Pt with h/o uncontrolled DM, multiple amputations, foot ulcer, gastroparesis admitted after PEA arrest.  Pt recently admitted with nausea, vomiting, resistant to IV reglan and other supportive care.  Gastric emptying study was abnormal.   Patient is currently intubated on ventilator support. Currently on hypothermia protocol.  MV: 5.3 L/min Temp (24hrs), Avg:93.1 F (33.9 C), Min:90.3 F (32.4 C), Max:98.1 F (36.7 C)  Propofol: none  Pt on paralytic.  Nutrition Focused Physical Exam: Subcutaneous Fat:  Orbital Region: WNL Upper Arm Region: WNL Thoracic and Lumbar Region: WNL  Muscle:  Temple Region: WNL Clavicle Bone Region: WNL Clavicle and Acromion Bone Region: WNL Scapular Bone Region: WNL Dorsal Hand: WNL Patellar Region: WNL Anterior Thigh Region: WNL Posterior Calf Region: WNL  Edema: none present  Pt's admission wt was 140 lbs. Current weight 124 lbs.  Wt change: 11% since admission.  PO intake over the past week has been 25-100% of bariatric liquid meals. Pt has not likely been meeting estimated nutrition needs.    Height: Ht Readings from Last 1 Encounters:  02/03/14 5\' 6"  (1.676 m)    Weight: Wt Readings from Last 1 Encounters:  02/03/14 124 lb 9 oz (56.5 kg)     Ideal Body Weight: 59.1 kg  % Ideal Body Weight: 95%  Wt Readings from Last 10 Encounters:  02/03/14 124 lb 9 oz (56.5 kg)  02/03/14 124 lb 9 oz (56.5 kg)  01/15/14 151 lb 0.2 oz (68.5 kg)  01/15/14 151 lb 0.2 oz (68.5 kg)  11/04/13 145 lb (65.772 kg)  11/04/13 145 lb (65.772 kg)  11/04/13 145 lb (65.772 kg)  11/04/13 145 lb 12.8 oz (66.134 kg)  06/18/13 142 lb 3.2 oz (64.501 kg)  05/14/13 137 lb 12.8 oz (62.506 kg)    Usual Body Weight: 145 lbs  % Usual Body Weight: 85%  BMI:  Body mass index is 20.11 kg/(m^2).  Estimated Nutritional Needs: Kcal: 1213 Protein: 73-84g Fluid: ~1.7 L/day  Skin: intact  Diet Order: NPO  EDUCATION NEEDS: -Education needs addressed   Intake/Output Summary (Last 24 hours) at 02/04/14 0930 Last data filed at 02/04/14 0650  Gross per 24 hour  Intake 1869.31 ml  Output    438 ml  Net 1431.31 ml    Last BM: 3/25  Labs:   Recent Labs Lab 01/29/14 0530 01/30/14 0500  02/03/14 2108 02/04/14 0209 02/04/14 0400 02/04/14 0822  NA 136* 138  < > 139 140 140 142  K 3.0* 3.2*  < > 2.7* 3.1* 2.9* 3.0*  CL 96 97  < > 100 102 102 106  CO2 27 26  < > 20 21 21   --   BUN 10 10  < > 16 17 17 15   CREATININE 1.34* 1.25*  < > 1.68* 1.64* 1.71* 1.80*  CALCIUM 9.1 8.8  < > 8.7 9.0  9.2  --   MG 1.2* 2.2  --   --   --  1.5  --   PHOS  --   --   --   --   --  1.0*  --   GLUCOSE 141* 133*  < > 336* 254* 172* 79  < > = values in this interval not displayed.  CBG (last 3)   Recent Labs  02/04/14 0306 02/04/14 0407 02/04/14 0507  GLUCAP 222* 175* 148*    Scheduled Meds: . antiseptic oral rinse  15 mL Mouth Rinse QID  . artificial tears  1 application Both Eyes 3 times per day  . chlorhexidine  15 mL Mouth Rinse BID  . levofloxacin (LEVAQUIN) IV  500 mg Intravenous Q24H  . pantoprazole (PROTONIX) IV  40 mg Intravenous Q12H  . sodium phosphate  Dextrose 5% IVPB  30 mmol Intravenous Once    Continuous Infusions: . sodium chloride  20 mL/hr at 02/03/14 2132  . cisatracurium (NIMBEX) infusion 1 mcg/kg/min (02/04/14 0305)  . fentaNYL infusion INTRAVENOUS 50 mcg/hr (02/04/14 0305)  . heparin 600 Units/hr (02/04/14 0455)  . insulin (NOVOLIN-R) infusion 2.5 Units/hr (02/04/14 0035)  . midazolam (VERSED) infusion 2 mg/hr (02/04/14 0305)  . norepinephrine (LEVOPHED) Adult infusion 2.5 mcg/min (02/04/14 0500)    Past Medical History  Diagnosis Date  . Diabetes mellitus   . Ulcer of lower limb, unspecified     ulcer on top of foot (right)  . Slow transit constipation   . Disorder of bone and cartilage, unspecified   . Unspecified essential hypertension   . Orthostatic hypotension   . Reflux esophagitis   . Gastroparesis   . Irritable bowel syndrome   . Flatulence, eructation, and gas pain   . Complication of anesthesia   . PONV (postoperative nausea and vomiting)     Past Surgical History  Procedure Laterality Date  . Esophagogastroduodenoscopy  08/11/2012    Procedure: ESOPHAGOGASTRODUODENOSCOPY (EGD);  Surgeon: Shirley FriarVincent C. Schooler, MD;  [facility];  Service: Endoscopy;  Laterality: N/A;  . Tubal ligation    . Toe amputation Left 11/09/2013    partial amputation of great left toe   . Amputation Right 11/07/2013    Procedure: AMPUTATION DIGIT - Right great toe;  Surgeon: Cheral AlmasNaiping Michael Xu, MD;  [facility];  Service: Orthopedics;  Laterality: Right;  . Amputation Left 11/09/2013    Procedure: AMPUTATION DIGIT - left great toe;  Surgeon: Cheral AlmasNaiping Michael Xu, MD;  [facility];  Service: Orthopedics;  Laterality: Left;  . Amputation Right 01/13/2014    Procedure: AMPUTATION RIGHT FIFTH TOE AND RIGHT SECOND TOE;  Surgeon: Valeria BatmanPeter W Whitfield, MD;  [facility];  Service: Orthopedics;  Laterality: Right;  . Esophagogastroduodenoscopy N/A 10/30/2014    Procedure: ESOPHAGOGASTRODUODENOSCOPY (EGD);  Surgeon: Willis ModenaWilliam Outlaw, MD;  [facility];  Service: Endoscopy;  Laterality: N/A;    Loyce DysKacie  Lisamarie Coke, MS RD LDN Clinical Inpatient Dietitian Pager: [phone] Weekend/After hours pager: [phone]

## 2014-02-04 NOTE — Progress Notes (Signed)
ANTICOAGULATION CONSULT NOTE   Pharmacy Consult for heparin Indication: chest pain/ACS  No Known Allergies  Patient Measurements: Height: 5\' 6"  (167.6 cm) Weight: 124 lb 9 oz (56.5 kg) (with pads no water ) IBW/kg (Calculated) : 59.3 Heparin Dosing Weight: 61.9 kg  Vital Signs: Temp: 90.3 F (32.4 C) (03/25 0400) Temp src: Core (Comment) (03/25 0400) BP: 138/92 mmHg (03/25 0400) Pulse Rate: 54 (03/25 0400)  Labs:  Recent Labs  02/03/14 1710 02/03/14 1713 02/03/14 2055 02/03/14 2100 02/03/14 2108 02/03/14 2332 02/04/14 0209 02/04/14 0420  HGB 10.2*  --  10.4* 11.2*  --   --   --   --   HCT 30.0*  --  29.7* 33.0*  --   --   --   --   PLT  --   --  264  --   --   --   --   --   APTT  --   --   --   --  25  --   --  38*  LABPROT  --   --   --   --  15.1  --   --  15.4*  INR  --   --   --   --  1.22  --   --  1.25  HEPARINUNFRC  --   --   --   --   --   --   --  0.23*  CREATININE 1.70* 1.54*  --  1.90* 1.68*  --  1.64*  --   TROPONINI  --  <0.30  --   --   --  0.37*  --   --     Estimated Creatinine Clearance: 36.2 ml/min (by C-G formula based on Cr of 1.64).   Medical History: Past Medical History  Diagnosis Date  . Diabetes mellitus   . Ulcer of lower limb, unspecified     ulcer on top of foot (right)  . Slow transit constipation   . Disorder of bone and cartilage, unspecified   . Unspecified essential hypertension   . Orthostatic hypotension   . Reflux esophagitis   . Gastroparesis   . Irritable bowel syndrome   . Flatulence, eructation, and gas pain   . Complication of anesthesia   . PONV (postoperative nausea and vomiting)     Medications:  Scheduled:  . antiseptic oral rinse  15 mL Mouth Rinse QID  . artificial tears  1 application Both Eyes 3 times per day  . chlorhexidine  15 mL Mouth Rinse BID  . fentaNYL  100 mcg Intravenous Once  . levofloxacin (LEVAQUIN) IV  500 mg Intravenous Q24H  . midazolam  2 mg Intravenous Once  . pantoprazole  (PROTONIX) IV  40 mg Intravenous Q12H  . potassium chloride  10 mEq Intravenous Q1 Hr x 6    Assessment: 53 yo female admitted with N/V/diarrhea on 02/06/2014 who was in stable condition earlier today until she had a PEA arrest.  Unknown downtime as she was not on telemetry.  Currently hypotensive and non-responsive on pressors.  Starting hypothermia protocol, and pharmacy asked to begin IV heparin as possible thrombo-embolic cause of PEA arrest.  Last CBC 3/17, H/H slightly low but otherwise WNL.  Initial heparin level 0.23 units/ml  Goal of Therapy:  Heparin level 0.3-0.7 units/ml Monitor platelets by anticoagulation protocol: Yes   Plan:  1. Increase heparin gtt to 600 units/hr. 2. Check heparin level 6 hrs after gtt starts. 3.  Check HL six hours  after rate change  Murvin Donning Clinical Pharmacist Pager [phone]  02/04/2014 4:50 AM

## 2014-02-04 NOTE — Progress Notes (Signed)
VASCULAR LAB PRELIMINARY  PRELIMINARY  PRELIMINARY  PRELIMINARY  Bilateral lower extremity venous duplex  completed.    Preliminary report:  Bilateral:  No evidence of DVT, superficial thrombosis, or Baker's Cyst.    Jamiyah Dingley, RVT 02/04/2014, 11:10 AM

## 2014-02-04 NOTE — Progress Notes (Signed)
Patient will PEA arrest late yesterday afternoon.  Intubated.  Prognosis appears guarded/poor.  Not much to offer at present from GI perspective, in light of these developments.  Will sign-off; please call back if needed.

## 2014-02-04 NOTE — Progress Notes (Signed)
Nursing: At 0100 [facility]rop[facility] Darrick Pennaeterding . No new orders received. At 0500- called critical results from [facility] of phosphorus and potassium. Orders received.

## 2014-02-04 NOTE — Progress Notes (Signed)
eLink Physician-Brief Progress Note Patient Name: Jennifer Beckmannammy C Cannata DOB: September 18, 1961 MRN: [id number]  Date of Service  02/04/2014   HPI/Events of Note  Hypokalemia   eICU Interventions  Potassium replaced   Intervention Category Intermediate Interventions: Electrolyte abnormality - evaluation and management  DETERDING,ELIZABETH 02/04/2014, 3:06 AM

## 2014-02-04 NOTE — Progress Notes (Signed)
Subjective:  Sedated, paralyzed, cooling protocol Normal ECG  Objective:  Temp:  [90.3 F (32.4 C)-98.1 F (36.7 C)] 91.4 F (33 C) (03/25 0600) Pulse Rate:  [52-91] 55 (03/25 0900) Resp:  [11-20] 12 (03/25 0900) BP: (62-152)/(41-97) 103/77 mmHg (03/25 0800) SpO2:  [97 %-100 %] 100 % (03/25 0900) Arterial Line BP: (110-220)/(52-102) 144/74 mmHg (03/25 0900) FiO2 (%):  [30 %-100 %] 30 % (03/25 0819) Weight:  [56.5 kg (124 lb 9 oz)] 56.5 kg (124 lb 9 oz) (03/24 2045) Weight change:   Intake/Output from previous day: 03/24 0701 - 03/25 0700 In: 1869.3 [P.O.:360; I.V.:873.3; IV Piggyback:636] Out: 438 [Urine:335; Emesis/NG output:100; Stool:3]  Intake/Output from this shift:    Medications: Current Facility-Administered Medications  Medication Dose Route Frequency Provider Last Rate Last Dose  . 0.9 %  sodium chloride infusion   Intravenous Continuous Rush Farmer, MD 20 mL/hr at 02/03/14 2132    . acetaminophen (TYLENOL) tablet 650 mg  650 mg Oral Q6H PRN Allie Bossier, MD   650 mg at 02/01/14 2019  . antiseptic oral rinse (BIOTENE) solution 15 mL  15 mL Mouth Rinse QID Chesley Mires, MD   15 mL at 02/04/14 0413  . artificial tears (LACRILUBE) ophthalmic ointment 1 application  1 application Both Eyes 3 times per day Chesley Mires, MD   1 application at 73/41/93 [phone]  . bisacodyl (DULCOLAX) suppository 10 mg  10 mg Rectal Daily PRN Adeline C Viyuoh, MD      . chlorhexidine (PERIDEX) 0.12 % solution 15 mL  15 mL Mouth Rinse BID Chesley Mires, MD   15 mL at 02/04/14 0800  . cisatracurium (NIMBEX) 200 mg in sodium chloride 0.9 % 200 mL infusion  1-1.5 mcg/kg/min Intravenous Continuous Rush Farmer, MD 3.7 mL/hr at 02/04/14 0305 1 mcg/kg/min at 02/04/14 0305  . fentaNYL (SUBLIMAZE) 10 mcg/mL in sodium chloride 0.9 % 250 mL infusion  25-300 mcg/hr Intravenous Continuous Rush Farmer, MD 5 mL/hr at 02/04/14 0305 50 mcg/hr at 02/04/14 0305  . fentaNYL (SUBLIMAZE) bolus via infusion  50 mcg  50 mcg Intravenous Q15 min PRN Rush Farmer, MD      . heparin ADULT infusion 100 units/mL (25000 units/250 mL)  600 Units/hr Intravenous Continuous Rush Farmer, MD 6 mL/hr at 02/04/14 0455 600 Units/hr at 02/04/14 0455  . insulin regular (NOVOLIN R,HUMULIN R) 1 Units/mL in sodium chloride 0.9 % 100 mL infusion   Intravenous Continuous Guadelupe Sabin Deterding, MD 2.5 mL/hr at 02/04/14 0035 2.5 Units/hr at 02/04/14 0035  . levofloxacin (LEVAQUIN) IVPB 500 mg  500 mg Intravenous Q24H Allie Bossier, MD   500 mg at 02/03/14 2324  . midazolam (VERSED) 1 mg/mL in sodium chloride 0.9 % 50 mL infusion  1-10 mg/hr Intravenous Continuous Rush Farmer, MD 2 mL/hr at 02/04/14 0305 2 mg/hr at 02/04/14 0305  . midazolam (VERSED) bolus via infusion 2 mg  2 mg Intravenous Q30 min PRN Rush Farmer, MD      . norepinephrine (LEVOPHED) 4 mg in dextrose 5 % 250 mL infusion  0.5-10 mcg/min Intravenous Titrated Chesley Mires, MD 9.4 mL/hr at 02/04/14 0500 2.5 mcg/min at 02/04/14 0500  . ondansetron (ZOFRAN) injection 4 mg  4 mg Intravenous Q6H PRN Allie Bossier, MD   4 mg at 02/03/14 1106  . pantoprazole (PROTONIX) injection 40 mg  40 mg Intravenous Q12H Arta Silence, MD   40 mg at 02/03/14 2144  . sodium phosphate 30  mmol in dextrose 5 % 250 mL infusion  30 mmol Intravenous Once Zigmund Gottron, MD   30 mmol at 02/04/14 0547    Physical Exam:  General: sedated, paralyzed, hypothermia Head: no evidence of trauma,  exophtalmos is seen, no myxedema, no xanthelasma; normal ears, nose and oropharynx Neck: normal jugular venous pulsations and no hepatojugular reflux; brisk carotid pulses without delay and no carotid bruits Chest: clear to auscultation, no signs of consolidation by percussion or palpation, normal fremitus, symmetrical and full respiratory excursions Cardiovascular: normal position and quality of the apical impulse, regular rhythm, normal first and second heart sounds, no murmurs, rubs  or gallops Abdomen: no tenderness or distention, no masses by palpation, no abnormal pulsatility or arterial bruits, normal bowel sounds, no hepatosplenomegaly Extremities: no clubbing, cyanosis or edema; 2+ radial, ulnar and brachial pulses bilaterally; 2+ right femoral, posterior tibial and dorsalis pedis pulses; 2+ left femoral, posterior tibial and dorsalis pedis pulses; no subclavian or femoral bruits Neurological: unable to examine   Lab Results: Results for orders placed during the hospital encounter of 01/28/2014 (from the past 48 hour(s))  GLUCOSE, CAPILLARY     Status: Abnormal   Collection Time    02/07/2014 11:05 AM      Result Value Ref Range   Glucose-Capillary 134 (*) 70 - 99 mg/dL   Comment 1 Notify RN     Comment 2 Documented in Chart    GLUCOSE, CAPILLARY     Status: Abnormal   Collection Time    01/30/2014  4:16 PM      Result Value Ref Range   Glucose-Capillary 138 (*) 70 - 99 mg/dL   Comment 1 Notify RN     Comment 2 Documented in Chart    GLUCOSE, CAPILLARY     Status: Abnormal   Collection Time    02/03/2014  9:37 PM      Result Value Ref Range   Glucose-Capillary 167 (*) 70 - 99 mg/dL  GLUCOSE, CAPILLARY     Status: Abnormal   Collection Time    02/03/14 12:26 AM      Result Value Ref Range   Glucose-Capillary 163 (*) 70 - 99 mg/dL  GLUCOSE, CAPILLARY     Status: Abnormal   Collection Time    02/03/14  4:16 AM      Result Value Ref Range   Glucose-Capillary 151 (*) 70 - 99 mg/dL  GLUCOSE, CAPILLARY     Status: Abnormal   Collection Time    02/03/14  8:33 AM      Result Value Ref Range   Glucose-Capillary 112 (*) 70 - 99 mg/dL  GLUCOSE, CAPILLARY     Status: Abnormal   Collection Time    02/03/14 12:04 PM      Result Value Ref Range   Glucose-Capillary 155 (*) 70 - 99 mg/dL  GLUCOSE, CAPILLARY     Status: Abnormal   Collection Time    02/03/14  5:02 PM      Result Value Ref Range   Glucose-Capillary 107 (*) 70 - 99 mg/dL  POCT I-STAT, CHEM 8      Status: Abnormal   Collection Time    02/03/14  5:10 PM      Result Value Ref Range   Sodium 138  137 - 147 mEq/L   Potassium 3.0 (*) 3.7 - 5.3 mEq/L   Chloride 98  96 - 112 mEq/L   BUN 10  6 - 23 mg/dL   Creatinine, Ser 1.75 (*)  0.50 - 1.10 mg/dL   Glucose, Bld 317 (*) 70 - 99 mg/dL   Calcium, Ion 1.13  1.12 - 1.23 mmol/L   TCO2 21  0 - 100 mmol/L   Hemoglobin 10.2 (*) 12.0 - 15.0 g/dL   HCT 30.0 (*) 36.0 - 52.8 %  BASIC METABOLIC PANEL     Status: Abnormal   Collection Time    02/03/14  5:13 PM      Result Value Ref Range   Sodium 141  137 - 147 mEq/L   Potassium 3.2 (*) 3.7 - 5.3 mEq/L   Chloride 94 (*) 96 - 112 mEq/L   CO2 17 (*) 19 - 32 mEq/L   Glucose, Bld 324 (*) 70 - 99 mg/dL   BUN 10  6 - 23 mg/dL   Creatinine, Ser 1.54 (*) 0.50 - 1.10 mg/dL   Calcium 9.0  8.4 - 10.5 mg/dL   GFR calc non Af Amer 38 (*) >90 mL/min   GFR calc Af Amer 44 (*) >90 mL/min   Comment: (NOTE)     The eGFR has been calculated using the CKD EPI equation.     This calculation has not been validated in all clinical situations.     eGFR's persistently <90 mL/min signify possible Chronic Kidney     Disease.  TROPONIN I     Status: None   Collection Time    02/03/14  5:13 PM      Result Value Ref Range   Troponin I <0.30  <0.30 ng/mL   Comment:            Due to the release kinetics of cTnI,     a negative result within the first hours     of the onset of symptoms does not rule out     myocardial infarction with certainty.     If myocardial infarction is still suspected,     repeat the test at appropriate intervals.  POCT I-STAT 3, BLOOD GAS (G3+)     Status: Abnormal   Collection Time    02/03/14  5:18 PM      Result Value Ref Range   pH, Arterial 7.312 (*) 7.350 - 7.450   pCO2 arterial 39.7  35.0 - 45.0 mmHg   pO2, Arterial 234.0 (*) 80.0 - 100.0 mmHg   Bicarbonate 20.1  20.0 - 24.0 mEq/L   TCO2 21  0 - 100 mmol/L   O2 Saturation 100.0     Acid-base deficit 6.0 (*) 0.0 - 2.0 mmol/L     Sample type ARTERIAL    LACTIC ACID, PLASMA     Status: Abnormal   Collection Time    02/03/14  5:55 PM      Result Value Ref Range   Lactic Acid, Venous 12.5 (*) 0.5 - 2.2 mmol/L  POCT I-STAT 3, BLOOD GAS (G3+)     Status: Abnormal   Collection Time    02/03/14  6:22 PM      Result Value Ref Range   pH, Arterial 7.388  7.350 - 7.450   pCO2 arterial 33.9 (*) 35.0 - 45.0 mmHg   pO2, Arterial 389.0 (*) 80.0 - 100.0 mmHg   Bicarbonate 20.9  20.0 - 24.0 mEq/L   TCO2 22  0 - 100 mmol/L   O2 Saturation 100.0     Acid-base deficit 4.0 (*) 0.0 - 2.0 mmol/L   Patient temperature 35.1 C     Collection site ARTERIAL LINE     Drawn by Mining engineer  Sample type ARTERIAL    CLOSTRIDIUM DIFFICILE BY PCR     Status: None   Collection Time    02/03/14  8:20 PM      Result Value Ref Range   C difficile by pcr NEGATIVE  NEGATIVE  CBC     Status: Abnormal   Collection Time    02/03/14  8:55 PM      Result Value Ref Range   WBC 9.7  4.0 - 10.5 K/uL   RBC 3.43 (*) 3.87 - 5.11 MIL/uL   Hemoglobin 10.4 (*) 12.0 - 15.0 g/dL   HCT 29.7 (*) 36.0 - 46.0 %   MCV 86.6  78.0 - 100.0 fL   MCH 30.3  26.0 - 34.0 pg   MCHC 35.0  30.0 - 36.0 g/dL   RDW 13.0  11.5 - 15.5 %   Platelets 264  150 - 400 K/uL  POCT I-STAT, CHEM 8     Status: Abnormal   Collection Time    02/03/14  9:00 PM      Result Value Ref Range   Sodium 140  137 - 147 mEq/L   Potassium 2.7 (*) 3.7 - 5.3 mEq/L   Chloride 101  96 - 112 mEq/L   BUN 13  6 - 23 mg/dL   Creatinine, Ser 1.90 (*) 0.50 - 1.10 mg/dL   Glucose, Bld 365 (*) 70 - 99 mg/dL   Calcium, Ion 1.20  1.12 - 1.23 mmol/L   TCO2 24  0 - 100 mmol/L   Hemoglobin 11.2 (*) 12.0 - 15.0 g/dL   HCT 33.0 (*) 36.0 - 18.2 %  BASIC METABOLIC PANEL     Status: Abnormal   Collection Time    02/03/14  9:08 PM      Result Value Ref Range   Sodium 139  137 - 147 mEq/L   Potassium 2.7 (*) 3.7 - 5.3 mEq/L   Comment: CRITICAL RESULT CALLED TO, READ BACK BY AND VERIFIED WITH:     G  HODGIN,RN 02/03/14 2153 RHOLMES   Chloride 100  96 - 112 mEq/L   CO2 20  19 - 32 mEq/L   Glucose, Bld 336 (*) 70 - 99 mg/dL   BUN 16  6 - 23 mg/dL   Creatinine, Ser 1.68 (*) 0.50 - 1.10 mg/dL   Calcium 8.7  8.4 - 10.5 mg/dL   GFR calc non Af Amer 34 (*) >90 mL/min   GFR calc Af Amer 40 (*) >90 mL/min   Comment: (NOTE)     The eGFR has been calculated using the CKD EPI equation.     This calculation has not been validated in all clinical situations.     eGFR's persistently <90 mL/min signify possible Chronic Kidney     Disease.  PROTIME-INR     Status: None   Collection Time    02/03/14  9:08 PM      Result Value Ref Range   Prothrombin Time 15.1  11.6 - 15.2 seconds   INR 1.22  0.00 - 1.49  APTT     Status: None   Collection Time    02/03/14  9:08 PM      Result Value Ref Range   aPTT 25  24 - 37 seconds  GLUCOSE, CAPILLARY     Status: Abnormal   Collection Time    02/03/14  9:58 PM      Result Value Ref Range   Glucose-Capillary 323 (*) 70 - 99 mg/dL  GLUCOSE, CAPILLARY  Status: Abnormal   Collection Time    02/03/14 11:16 PM      Result Value Ref Range   Glucose-Capillary 310 (*) 70 - 99 mg/dL  TROPONIN I     Status: Abnormal   Collection Time    02/03/14 11:32 PM      Result Value Ref Range   Troponin I 0.37 (*) <0.30 ng/mL   Comment:            Due to the release kinetics of cTnI,     a negative result within the first hours     of the onset of symptoms does not rule out     myocardial infarction with certainty.     If myocardial infarction is still suspected,     repeat the test at appropriate intervals.     CRITICAL RESULT CALLED TO, READ BACK BY AND VERIFIED WITH:     G HODGIN,RN 02/04/14 0040 RHOLMES  GLUCOSE, CAPILLARY     Status: Abnormal   Collection Time    02/04/14  1:13 AM      Result Value Ref Range   Glucose-Capillary 286 (*) 70 - 99 mg/dL  GLUCOSE, CAPILLARY     Status: Abnormal   Collection Time    02/04/14  2:03 AM      Result Value Ref  Range   Glucose-Capillary 243 (*) 70 - 99 mg/dL  BASIC METABOLIC PANEL     Status: Abnormal   Collection Time    02/04/14  2:09 AM      Result Value Ref Range   Sodium 140  137 - 147 mEq/L   Potassium 3.1 (*) 3.7 - 5.3 mEq/L   Chloride 102  96 - 112 mEq/L   CO2 21  19 - 32 mEq/L   Glucose, Bld 254 (*) 70 - 99 mg/dL   BUN 17  6 - 23 mg/dL   Creatinine, Ser 1.64 (*) 0.50 - 1.10 mg/dL   Calcium 9.0  8.4 - 10.5 mg/dL   GFR calc non Af Amer 35 (*) >90 mL/min   GFR calc Af Amer 41 (*) >90 mL/min   Comment: (NOTE)     The eGFR has been calculated using the CKD EPI equation.     This calculation has not been validated in all clinical situations.     eGFR's persistently <90 mL/min signify possible Chronic Kidney     Disease.  MRSA PCR SCREENING     Status: None   Collection Time    02/04/14  2:58 AM      Result Value Ref Range   MRSA by PCR NEGATIVE  NEGATIVE   Comment:            The GeneXpert MRSA Assay (FDA     approved for NASAL specimens     only), is one component of a     comprehensive MRSA colonization     surveillance program. It is not     intended to diagnose MRSA     infection nor to guide or     monitor treatment for     MRSA infections.  GLUCOSE, CAPILLARY     Status: Abnormal   Collection Time    02/04/14  3:06 AM      Result Value Ref Range   Glucose-Capillary 222 (*) 70 - 99 mg/dL  BASIC METABOLIC PANEL     Status: Abnormal   Collection Time    02/04/14  4:00 AM      Result Value  Ref Range   Sodium 140  137 - 147 mEq/L   Potassium 2.9 (*) 3.7 - 5.3 mEq/L   Comment: CRITICAL RESULT CALLED TO, READ BACK BY AND VERIFIED WITH:     G HODGIN,RN 02/04/14 0502 RHOLMES   Chloride 102  96 - 112 mEq/L   CO2 21  19 - 32 mEq/L   Glucose, Bld 172 (*) 70 - 99 mg/dL   BUN 17  6 - 23 mg/dL   Creatinine, Ser 1.71 (*) 0.50 - 1.10 mg/dL   Calcium 9.2  8.4 - 10.5 mg/dL   GFR calc non Af Amer 34 (*) >90 mL/min   GFR calc Af Amer 39 (*) >90 mL/min   Comment: (NOTE)     The  eGFR has been calculated using the CKD EPI equation.     This calculation has not been validated in all clinical situations.     eGFR's persistently <90 mL/min signify possible Chronic Kidney     Disease.  MAGNESIUM     Status: None   Collection Time    02/04/14  4:00 AM      Result Value Ref Range   Magnesium 1.5  1.5 - 2.5 mg/dL  PHOSPHORUS     Status: Abnormal   Collection Time    02/04/14  4:00 AM      Result Value Ref Range   Phosphorus 1.0 (*) 2.3 - 4.6 mg/dL   Comment: CRITICAL RESULT CALLED TO, READ BACK BY AND VERIFIED WITH:     G HODGIN,RN 02/04/14 0502 RHOLMES  GLUCOSE, CAPILLARY     Status: Abnormal   Collection Time    02/04/14  4:07 AM      Result Value Ref Range   Glucose-Capillary 175 (*) 70 - 99 mg/dL  TROPONIN I     Status: None   Collection Time    02/04/14  4:20 AM      Result Value Ref Range   Troponin I <0.30  <0.30 ng/mL   Comment:            Due to the release kinetics of cTnI,     a negative result within the first hours     of the onset of symptoms does not rule out     myocardial infarction with certainty.     If myocardial infarction is still suspected,     repeat the test at appropriate intervals.  PROTIME-INR     Status: Abnormal   Collection Time    02/04/14  4:20 AM      Result Value Ref Range   Prothrombin Time 15.4 (*) 11.6 - 15.2 seconds   INR 1.25  0.00 - 1.49  APTT     Status: Abnormal   Collection Time    02/04/14  4:20 AM      Result Value Ref Range   aPTT 38 (*) 24 - 37 seconds   Comment:            IF BASELINE aPTT IS ELEVATED,     SUGGEST PATIENT RISK ASSESSMENT     BE USED TO DETERMINE APPROPRIATE     ANTICOAGULANT THERAPY.  HEPARIN LEVEL (UNFRACTIONATED)     Status: Abnormal   Collection Time    02/04/14  4:20 AM      Result Value Ref Range   Heparin Unfractionated 0.23 (*) 0.30 - 0.70 IU/mL   Comment:            IF HEPARIN RESULTS ARE BELOW  EXPECTED VALUES, AND PATIENT     DOSAGE HAS BEEN CONFIRMED,     SUGGEST  FOLLOW UP TESTING     OF ANTITHROMBIN III LEVELS.  BLOOD GAS, ARTERIAL     Status: Abnormal   Collection Time    02/04/14  4:29 AM      Result Value Ref Range   FIO2 0.40     Delivery systems VENTILATOR     Mode PRESSURE REGULATED VOLUME CONTROL     VT 430     Rate 16     Peep/cpap 5.0     pH, Arterial 7.569 (*) 7.350 - 7.450   pCO2 arterial 23.7 (*) 35.0 - 45.0 mmHg   pO2, Arterial 226.0 (*) 80.0 - 100.0 mmHg   Bicarbonate 22.7  20.0 - 24.0 mEq/L   TCO2 23.6  0 - 100 mmol/L   Acid-base deficit 0.1  0.0 - 2.0 mmol/L   O2 Saturation 100.0     Patient temperature 91.4     Collection site A-LINE     Drawn by COLLECTED BY RT     Sample type ARTERIAL DRAW     Allens test (pass/fail) PASS  PASS  GLUCOSE, CAPILLARY     Status: Abnormal   Collection Time    02/04/14  5:07 AM      Result Value Ref Range   Glucose-Capillary 148 (*) 70 - 99 mg/dL  POCT I-STAT 3, BLOOD GAS (G3+)     Status: Abnormal   Collection Time    02/04/14  6:47 AM      Result Value Ref Range   pH, Arterial 7.459 (*) 7.350 - 7.450   pCO2 arterial 31.5 (*) 35.0 - 45.0 mmHg   pO2, Arterial 154.0 (*) 80.0 - 100.0 mmHg   Bicarbonate 23.3  20.0 - 24.0 mEq/L   TCO2 24  0 - 100 mmol/L   O2 Saturation 100.0     Acid-base deficit 1.0  0.0 - 2.0 mmol/L   Patient temperature 33.0 C     Collection site RADIAL, ALLEN'S TEST ACCEPTABLE     Drawn by RT     Sample type ARTERIAL    POCT I-STAT, CHEM 8     Status: Abnormal   Collection Time    02/04/14  8:22 AM      Result Value Ref Range   Sodium 142  137 - 147 mEq/L   Potassium 3.0 (*) 3.7 - 5.3 mEq/L   Chloride 106  96 - 112 mEq/L   BUN 15  6 - 23 mg/dL   Creatinine, Ser 1.80 (*) 0.50 - 1.10 mg/dL   Glucose, Bld 79  70 - 99 mg/dL   Calcium, Ion 1.24 (*) 1.12 - 1.23 mmol/L   TCO2 24  0 - 100 mmol/L   Hemoglobin 9.9 (*) 12.0 - 15.0 g/dL   HCT 29.0 (*) 36.0 - 46.0 %    Imaging: Imaging results have been reviewed and Dg Chest Port 1 View  02/04/2014   CLINICAL  DATA:  Shortness of breath and evaluate endotracheal tube.  EXAM: PORTABLE CHEST - 1 VIEW  COMPARISON:  DG CHEST 1V PORT dated 02/03/2014  FINDINGS: Endotracheal tube is now 3.5 cm above the carina. Nasogastric tube in the upper stomach. Patient now has defibrillator pads overlying the left side of the chest. There is a left jugular central venous catheter. Central line tip in the upper SVC region. Negative for a pneumothorax. Lungs are clear without airspace disease or edema.  IMPRESSION: The endotracheal  tube has been repositioned and it is appropriately positioned above the carina.  No focal lung disease.   Electronically Signed   By: Markus Daft M.D.   On: 02/04/2014 08:40   Dg Chest Port 1 View  02/03/2014   CLINICAL DATA:  Line placement  EXAM: PORTABLE CHEST - 1 VIEW  COMPARISON:  12/18/2013  FINDINGS: Right mainstem intubation, recommend retraction by 4 cm. Left IJ catheter, tip at the level of the distal SVC. No pneumothorax.  Clear lungs.  Normal heart size.  Mildly displaced anterolateral left third and fourth rib fractures.  Critical Value/emergent results were called by telephone at the time of interpretation on 02/03/2014 at 6:52 PM to RN [place], who verbally acknowledged these results.  IMPRESSION: 1. Right mainstem intubation. Recommend endotracheal tube retraction by 4 cm. 2. Left IJ catheter in good position.  No pneumothorax. 3. Left third and fourth rib fractures.   Electronically Signed   By: Jorje Guild M.D.   On: 02/03/2014 18:56   Dg Abd 2 Views  02/03/2014   CLINICAL DATA:  Nausea, vomiting and abdominal pain.  EXAM: ABDOMEN - 2 VIEW  COMPARISON:  CT, 01/17/2014.  Radiographs, 08/18/2012  FINDINGS: Mild increased stool burden in the colon. No obstruction. No free air. No generalized adynamic ileus.  Soft tissues are unremarkable. No renal or ureteral stones are evident.  There is a fracture of the posterior left tenth rib. This could be recent or chronic.  IMPRESSION: 1. No acute  findings within the abdomen or pelvis. 2. Fracture the posterior left tenth rib of unclear chronicity. 3. Mild increased stool burden in the colon.   Electronically Signed   By: Lajean Manes M.D.   On: 02/03/2014 12:28   Ct Portable Head W/o Cm  02/03/2014   CLINICAL DATA:  Unresponsive.  EXAM: CT HEAD WITHOUT CONTRAST  TECHNIQUE: Contiguous axial images were obtained from the base of the skull through the vertex without intravenous contrast.  COMPARISON:  None.  FINDINGS: The basal ganglia and insular cortex appear to be intact bilaterally. No acute cortical infarct, hemorrhage, or mass lesion is present. There is significant artifactual no is on the scan due to portable technique. The ventricles are of normal size. No significant extra-axial fluid collection is present.  The patient is intubated. Minimal fluid is present in the left maxillary sinus. A small polyp or mucous retention cyst is present in the right maxillary sinus. Paranasal sinuses and mastoid air cells are clear. The osseous skull is intact.  IMPRESSION: 1. Gray-white differentiation is preserved without evidence for diffuse edema or ischemic insult. 2. There significant artifact due to portable technique. Follow-up CT scan or repeat CT scan within the department may prove useful for increased sensitivity to progressed ischemic disease if present.   Electronically Signed   By: Lawrence Santiago M.D.   On: 02/03/2014 19:05    Assessment:  1. Active Problems: 2.   Shock liver 3.   Gastroparesis due to DM 4.   Hypertension 5.   Type II or unspecified type diabetes mellitus with unspecified complication, uncontrolled 6.   Neuropathic diabetic ulcer of foot 7.   History of amputation of lesser toe of right foot 8.   Amputation of second toe, right, traumatic 9.   Vomiting and diarrhea 10.   Nausea 11.   Acute renal failure 12.   Cardiac arrest 13.   Cardiogenic shock 14.   Acute respiratory failure 15.   Altered mental  status 16.  Plan:  1. Minimal increase in cardiac enzymes has already dissipated - consistent with the arrest, not true infarction 2. Echo pending 3. Doubt we will find structural heart disease as a cause of her event. 4. The only precipitant that I can identify is severe hypokalemia, altough PEA is a less usual presentation - would have expected ventricular tachyarrhythmia  Prognosis is very guarded.  Time Spent Directly with Patient:  30 minutes  Length of Stay:  LOS: 11 days    Juanpablo Ciresi 02/04/2014, 9:38 AM

## 2014-02-04 NOTE — Progress Notes (Signed)
ANTICOAGULATION CONSULT NOTE - Initial Consult  Pharmacy Consult for heparin Indication: Possible ACS or PE (PEA arrest)   No Known Allergies  Patient Measurements: Height: 5\' 6"  (167.6 cm) Weight: 124 lb 9 oz (56.5 kg) (with pads no water ) IBW/kg (Calculated) : 59.3 Heparin Dosing Weight: 61.9 kg  Vital Signs: Temp: 91.4 F (33 C) (03/25 0600) Temp src: Core (Comment) (03/25 0600) BP: 114/76 mmHg (03/25 1000) Pulse Rate: 53 (03/25 1222)  Labs:  Recent Labs  02/03/14 1713 02/03/14 2055 02/03/14 2100 02/03/14 2108 02/03/14 2332 02/04/14 0209 02/04/14 0400 02/04/14 0420 02/04/14 0822 02/04/14 1105  HGB  --  10.4* 11.2*  --   --   --   --   --  9.9*  --   HCT  --  29.7* 33.0*  --   --   --   --   --  29.0*  --   PLT  --  264  --   --   --   --   --   --   --   --   APTT  --   --   --  25  --   --   --  38*  --   --   LABPROT  --   --   --  15.1  --   --   --  15.4*  --   --   INR  --   --   --  1.22  --   --   --  1.25  --   --   HEPARINUNFRC  --   --   --   --   --   --   --  0.23*  --  0.39  CREATININE 1.54*  --  1.90* 1.68*  --  1.64* 1.71*  --  1.80*  --   TROPONINI <0.30  --   --   --  0.37*  --   --  <0.30  --   --     Estimated Creatinine Clearance: 33 ml/min (by C-G formula based on Cr of 1.8).   Medical History: Past Medical History  Diagnosis Date  . Diabetes mellitus   . Ulcer of lower limb, unspecified     ulcer on top of foot (right)  . Slow transit constipation   . Disorder of bone and cartilage, unspecified   . Unspecified essential hypertension   . Orthostatic hypotension   . Reflux esophagitis   . Gastroparesis   . Irritable bowel syndrome   . Flatulence, eructation, and gas pain   . Complication of anesthesia   . PONV (postoperative nausea and vomiting)     Medications:  Scheduled:  . antiseptic oral rinse  15 mL Mouth Rinse QID  . artificial tears  1 application Both Eyes 3 times per day  . chlorhexidine  15 mL Mouth Rinse BID  .  feeding supplement (VITAL AF 1.2 CAL)  1,000 mL Per Tube Q24H  . insulin aspart  2-6 Units Subcutaneous 6 times per day  . insulin glargine  10 Units Subcutaneous Q24H  . levofloxacin (LEVAQUIN) IV  500 mg Intravenous Q24H  . pantoprazole (PROTONIX) IV  40 mg Intravenous Q12H    Assessment: 53 yo female admitted with N/V/D on 02/06/2014 who was in stable condition until developing PEA arrest on 3/24. Patient is currently on [facility]. Rewarming will start at ~ 2130 on 3/25. Pharmacy was consulted to dose heparin for possible PE or ACS  as cause of PEA arrest.   Heparin level therapeutic x 1 (0.39) on heparin 600 units/hr. Anticipate heparin clearance will increase when rewarming begins.   Hgb low at 9.9 but has been relatively stable, platelets 264. No bleeding noted.   Goal of Therapy:  Heparin level 0.3-0.7 units/ml Monitor platelets by anticoagulation protocol: Yes   Plan:  1. Continue heparin 600 units/hr  2. Check 6-hr heparin level at 1700 to verify therapeutic level 3. Will need more frequent monitoring during rewarming 4. Monitor CBC daily and for signs/symptoms of bleeding  Tomie China, PharmD Candidate   02/04/2014 12:50 PM

## 2014-02-04 NOTE — Plan of Care (Signed)
Problem: Consults Goal: Chaplain Consult Outcome: Completed/Met Date Met:  02/04/14 Called to provide support for family on 02/03/14.   Problem: Phase II Progression Outcomes Goal: Active warming per orders Outcome: Progressing Started rewarming at 2130 on 02/04/14

## 2014-02-04 NOTE — Progress Notes (Signed)
ANTICOAGULATION CONSULT NOTE - Follow Up Consult  Pharmacy Consult for Heparin Indication: r/o ACS vs PE (s/p PEA arrest)  No Known Allergies Patient Measurements: Height: 5\' 6"  (167.6 cm) Weight: 124 lb 9 oz (56.5 kg) (with pads no water ) IBW/kg (Calculated) : 59.3 Heparin Dosing Weight: 61.9 kg Vital Signs: BP: 174/72 mmHg (03/25 1600) Pulse Rate: 50 (03/25 1700) Labs:  Recent Labs  02/03/14 1713 02/03/14 2055 02/03/14 2100 02/03/14 2108 02/03/14 2332  02/04/14 0420 02/04/14 0822 02/04/14 1105 02/04/14 1200 02/04/14 1600 02/04/14 1700  HGB  --  10.4* 11.2*  --   --   --   --  9.9*  --   --   --   --   HCT  --  29.7* 33.0*  --   --   --   --  29.0*  --   --   --   --   PLT  --  264  --   --   --   --   --   --   --   --   --   --   APTT  --   --   --  25  --   --  38*  --   --   --   --   --   LABPROT  --   --   --  15.1  --   --  15.4*  --   --   --   --   --   INR  --   --   --  1.22  --   --  1.25  --   --   --   --   --   HEPARINUNFRC  --   --   --   --   --   --  0.23*  --  0.39  --   --  0.53  CREATININE 1.54*  --  1.90* 1.68*  --   < >  --  1.80*  --  1.71* 1.76*  --   TROPONINI <0.30  --   --   --  0.37*  --  <0.30  --   --   --   --   --   < > = values in this interval not displayed.  Estimated Creatinine Clearance: 33.7 ml/min (by C-G formula based on Cr of 1.76).  Medications:  Heparin at 600 units/hr  Assessment: 53 YOM on IV heparin for ACS vs PE s/p PEA arrest to start rewarming at 2130 PM tonight. Heparin level remains therapeutic on rate of 600 units/hr. No bleeding reported.   Goal of Therapy:  Heparin level 0.3-0.7 units/ml Monitor platelets by anticoagulation protocol: Yes   Plan:  1. Continue heparin at 600 units/hr. 2. Follow-up level 6 hours post start of rewarming.   Link SnufferJessica Neiman Roots, PharmD, BCPS Clinical Pharmacist [phone] 02/04/2014,6:32 PM

## 2014-02-05 ENCOUNTER — Inpatient Hospital Stay (HOSPITAL_COMMUNITY): Payer: BC Managed Care – PPO

## 2014-02-05 DIAGNOSIS — I369 Nonrheumatic tricuspid valve disorder, unspecified: Secondary | ICD-10-CM

## 2014-02-05 DIAGNOSIS — R579 Shock, unspecified: Secondary | ICD-10-CM

## 2014-02-05 LAB — BASIC METABOLIC PANEL
BUN: 18 mg/dL (ref 6–23)
BUN: 18 mg/dL (ref 6–23)
BUN: 18 mg/dL (ref 6–23)
CALCIUM: 8.8 mg/dL (ref 8.4–10.5)
CHLORIDE: 104 meq/L (ref 96–112)
CHLORIDE: 105 meq/L (ref 96–112)
CO2: 21 mEq/L (ref 19–32)
CO2: 22 mEq/L (ref 19–32)
CO2: 22 mEq/L (ref 19–32)
CREATININE: 1.96 mg/dL — AB (ref 0.50–1.10)
CREATININE: 2.09 mg/dL — AB (ref 0.50–1.10)
Calcium: 8.8 mg/dL (ref 8.4–10.5)
Calcium: 8.9 mg/dL (ref 8.4–10.5)
Chloride: 106 mEq/L (ref 96–112)
Creatinine, Ser: 2.02 mg/dL — ABNORMAL HIGH (ref 0.50–1.10)
GFR calc Af Amer: 32 mL/min — ABNORMAL LOW (ref >90)
GFR calc Af Amer: 30 mL/min — ABNORMAL LOW (ref >90)
GFR calc non Af Amer: 27 mL/min — ABNORMAL LOW (ref >90)
GFR calc non Af Amer: 26 mL/min — ABNORMAL LOW (ref >90)
GFR, EST AFRICAN AMERICAN: 33 mL/min — AB (ref >90)
GFR, EST NON AFRICAN AMERICAN: 28 mL/min — AB (ref >90)
Glucose, Bld: 112 mg/dL — ABNORMAL HIGH (ref 70–99)
Glucose, Bld: 136 mg/dL — ABNORMAL HIGH (ref 70–99)
Glucose, Bld: 99 mg/dL (ref 70–99)
POTASSIUM: 3.3 meq/L — AB (ref 3.7–5.3)
POTASSIUM: 3.6 meq/L — AB (ref 3.7–5.3)
Potassium: 3.5 mEq/L — ABNORMAL LOW (ref 3.7–5.3)
Sodium: 141 mEq/L (ref 137–147)
Sodium: 142 mEq/L (ref 137–147)
Sodium: 142 mEq/L (ref 137–147)

## 2014-02-05 LAB — GLUCOSE, CAPILLARY
GLUCOSE-CAPILLARY: 88 mg/dL (ref 70–99)
Glucose-Capillary: 94 mg/dL (ref 70–99)
Glucose-Capillary: 75 mg/dL (ref 70–99)

## 2014-02-05 LAB — BLOOD GAS, ARTERIAL
Acid-base deficit: 2.9 mmol/L — ABNORMAL HIGH (ref 0.0–2.0)
BICARBONATE: 21.9 meq/L (ref 20.0–24.0)
Drawn by: 331761
FIO2: 0.3 %
MECHVT: 430 mL
O2 SAT: 98.9 %
PEEP/CPAP: 5 cmH2O
PO2 ART: 136 mmHg — AB (ref 80.0–100.0)
Patient temperature: 95
RATE: 12 resp/min
TCO2: 23.1 mmol/L (ref 0–100)
pCO2 arterial: 37.4 mmHg (ref 35.0–45.0)
pH, Arterial: 7.373 (ref 7.350–7.450)

## 2014-02-05 LAB — CBC
HCT: 29.9 % — ABNORMAL LOW (ref 36.0–46.0)
HEMOGLOBIN: 10.6 g/dL — AB (ref 12.0–15.0)
MCH: 30.7 pg (ref 26.0–34.0)
MCHC: 35.5 g/dL (ref 30.0–36.0)
MCV: 86.7 fL (ref 78.0–100.0)
Platelets: 284 10*3/uL (ref 150–400)
RBC: 3.45 MIL/uL — ABNORMAL LOW (ref 3.87–5.11)
RDW: 13.4 % (ref 11.5–15.5)
WBC: 9.9 10*3/uL (ref 4.0–10.5)

## 2014-02-05 LAB — HEPARIN LEVEL (UNFRACTIONATED)
Heparin Unfractionated: 0.53 IU/mL (ref 0.30–0.70)
Heparin Unfractionated: 0.3 IU/mL (ref 0.30–0.70)

## 2014-02-05 LAB — MAGNESIUM: Magnesium: 1.8 mg/dL (ref 1.5–2.5)

## 2014-02-05 LAB — PHOSPHORUS: Phosphorus: 5 mg/dL — ABNORMAL HIGH (ref 2.3–4.6)

## 2014-02-05 MED ORDER — NOREPINEPHRINE BITARTRATE 1 MG/ML IJ SOLN
0.5000 ug/min | INTRAVENOUS | Status: DC
  Administered 2014-02-05: 7 ug/min via INTRAVENOUS
  Filled 2014-02-05 (×2): qty 8

## 2014-02-05 MED ORDER — IOHEXOL 300 MG/ML  SOLN
50.0000 mL | Freq: Once | INTRAMUSCULAR | Status: AC | PRN
  Administered 2014-02-05: 30 mL

## 2014-02-05 MED ORDER — POTASSIUM CHLORIDE 20 MEQ/15ML (10%) PO LIQD
40.0000 meq | Freq: Once | ORAL | Status: AC
  Administered 2014-02-05: 40 meq
  Filled 2014-02-05: qty 30

## 2014-02-05 MED ORDER — MAGNESIUM SULFATE 40 MG/ML IJ SOLN
2.0000 g | Freq: Once | INTRAMUSCULAR | Status: AC
  Administered 2014-02-05: 2 g via INTRAVENOUS
  Filled 2014-02-05: qty 50

## 2014-02-05 MED ORDER — SODIUM CHLORIDE 0.9 % IV BOLUS (SEPSIS)
250.0000 mL | Freq: Once | INTRAVENOUS | Status: AC
  Administered 2014-02-05: 250 mL via INTRAVENOUS

## 2014-02-05 NOTE — Progress Notes (Signed)
Patient Name: Jennifer [name] Date of Encounter: 02/05/2014  Active Problems:   Shock liver   Gastroparesis due to DM   Hypertension   Type II or unspecified type diabetes mellitus with unspecified complication, uncontrolled   Neuropathic diabetic ulcer of foot   History of amputation of lesser toe of right foot   Amputation of second toe, right, traumatic   Vomiting and diarrhea   Nausea   Acute renal failure   Cardiac arrest   Cardiogenic shock   Acute respiratory failure   Altered mental status   Length of Stay: 12  SUBJECTIVE  Warming Paralytics just stopped No change in neuro activity yet Oliguric, hypotensive, requiring a slight increase in pressors this morning CVP 4  CURRENT MEDS . antiseptic oral rinse  15 mL Mouth Rinse QID  . chlorhexidine  15 mL Mouth Rinse BID  . feeding supplement (VITAL AF 1.2 CAL)  1,000 mL Per Tube Q24H  . insulin aspart  2-6 Units Subcutaneous 6 times per day  . insulin glargine  10 Units Subcutaneous Q24H  . levofloxacin (LEVAQUIN) IV  500 mg Intravenous Q24H  . pantoprazole (PROTONIX) IV  40 mg Intravenous Q12H  . sodium chloride  250 mL Intravenous Once    OBJECTIVE   Intake/Output Summary (Last 24 hours) at 02/05/14 0916 Last data filed at 02/05/14 0800  Gross per 24 hour  Intake 1403.4 ml  Output    540 ml  Net  863.4 ml   Filed Weights   01/31/14 2017 02/03/14 2045 02/05/14 0500  Weight: 61.871 kg (136 lb 6.4 oz) 56.5 kg (124 lb 9 oz) 60.49 kg (133 lb 5.7 oz)    PHYSICAL EXAM Filed Vitals:   02/05/14 0600 02/05/14 0700 02/05/14 0800 02/05/14 0903  BP: 180/80  119/55 136/61  Pulse: 70 72 71 73  Temp: 96.1 F (35.6 C) 96.4 F (35.8 C) 96.8 F (36 C)   TempSrc: Core (Comment) Core (Comment) Core (Comment)   Resp: 12 12 12 12   Height:      Weight:      SpO2: 100% 100% 100% 100%   General: intubated, unresponsive Head: no evidence of trauma,  exophtalmos present (constitutional?), no myxedema, no xanthelasma;  normal ears, nose and oropharynx Neck: normal jugular venous pulsations and no hepatojugular reflux; brisk carotid pulses without delay and no carotid bruits Chest: clear to auscultation, no signs of consolidation by percussion or palpation, normal fremitus, symmetrical and full respiratory excursions Cardiovascular: normal position and quality of the apical impulse, regular rhythm, normal first and second heart sounds, no rubs or gallops, no murmur Abdomen: no tenderness or distention, no masses by palpation, no abnormal pulsatility or arterial bruits, normal bowel sounds, no hepatosplenomegaly Extremities: no clubbing, cyanosis or edema; 2+ radial, ulnar and brachial pulses bilaterally; 2+ right femoral, posterior tibial and dorsalis pedis pulses; 2+ left femoral, posterior tibial and dorsalis pedis pulses; no subclavian or femoral bruits Neurological: grossly nonfocal  LABS  CBC  Recent Labs  02/03/14 2055  02/04/14 0822 02/05/14 0400  WBC 9.7  --   --  9.9  HGB 10.4*  < > 9.9* 10.6*  HCT 29.7*  < > 29.0* 29.9*  MCV 86.6  --   --  86.7  PLT 264  --   --  284  < > = values in this interval not displayed. Basic Metabolic Panel  Recent Labs  02/04/14 0400  02/05/14 02/05/14 0400  NA 140  < > 142 141  K 2.9*  < >  3.3* 3.6*  CL 102  < > 106 104  CO2 21  < > 22 21  GLUCOSE 172*  < > 112* 136*  BUN 17  < > 18 18  CREATININE 1.71*  < > 1.96* 2.02*  CALCIUM 9.2  < > 8.8 8.9  MG 1.5  --   --  1.8  PHOS 1.0*  --   --  5.0*  < > = values in this interval not displayed. Liver Function Tests  Cardiac Enzymes  Recent Labs  02/03/14 1713 02/03/14 2332 02/04/14 0420  TROPONINI <0.30 0.37* <0.30    Radiology Studies Imaging results have been reviewed and Dg Chest Port 1 View  02/05/2014   CLINICAL DATA:  Respiratory difficulty  EXAM: PORTABLE CHEST - 1 VIEW  COMPARISON:  DG CHEST 1V PORT dated 02/04/2014  FINDINGS: Endotracheal tube and NG tube are stable. Lungs are clear. Left  internal jugular central venous catheter are stable.  IMPRESSION: No active cardiopulmonary disease.   Electronically Signed   By: Maryclare BeanArt  Hoss M.D.   On: 02/05/2014 08:07   Dg Chest Port 1 View  02/04/2014   CLINICAL DATA:  Shortness of breath and evaluate endotracheal tube.  EXAM: PORTABLE CHEST - 1 VIEW  COMPARISON:  DG CHEST 1V PORT dated 02/03/2014  FINDINGS: Endotracheal tube is now 3.5 cm above the carina. Nasogastric tube in the upper stomach. Patient now has defibrillator pads overlying the left side of the chest. There is a left jugular central venous catheter. Central line tip in the upper SVC region. Negative for a pneumothorax. Lungs are clear without airspace disease or edema.  IMPRESSION: The endotracheal tube has been repositioned and it is appropriately positioned above the carina.  No focal lung disease.   Electronically Signed   By: Richarda OverlieAdam  Henn M.D.   On: 02/04/2014 08:40   Dg Chest Port 1 View  02/03/2014   CLINICAL DATA:  Line placement  EXAM: PORTABLE CHEST - 1 VIEW  COMPARISON:  12/18/2013  FINDINGS: Right mainstem intubation, recommend retraction by 4 cm. Left IJ catheter, tip at the level of the distal SVC. No pneumothorax.  Clear lungs.  Normal heart size.  Mildly displaced anterolateral left third and fourth rib fractures.  Critical Value/emergent results were called by telephone at the time of interpretation on 02/03/2014 at 6:52 PM to RN [place], who verbally acknowledged these results.  IMPRESSION: 1. Right mainstem intubation. Recommend endotracheal tube retraction by 4 cm. 2. Left IJ catheter in good position.  No pneumothorax. 3. Left third and fourth rib fractures.   Electronically Signed   By: Tiburcio PeaJonathan  Watts M.D.   On: 02/03/2014 18:56   Dg Abd 2 Views  02/03/2014   CLINICAL DATA:  Nausea, vomiting and abdominal pain.  EXAM: ABDOMEN - 2 VIEW  COMPARISON:  CT, 01/25/2014.  Radiographs, 08/18/2012  FINDINGS: Mild increased stool burden in the colon. No obstruction. No free air.  No generalized adynamic ileus.  Soft tissues are unremarkable. No renal or ureteral stones are evident.  There is a fracture of the posterior left tenth rib. This could be recent or chronic.  IMPRESSION: 1. No acute findings within the abdomen or pelvis. 2. Fracture the posterior left tenth rib of unclear chronicity. 3. Mild increased stool burden in the colon.   Electronically Signed   By: Amie Portlandavid  Ormond M.D.   On: 02/03/2014 12:28   Dg Abd Portable 1v  02/04/2014   CLINICAL DATA:  Status post pyloric tube placement  EXAM:  PORTABLE ABDOMEN - 1 VIEW  COMPARISON:  DG ABD 2 VIEWS dated 02/03/2014  FINDINGS: There is a nondisplaced fracture of the posterior tenth and eleventh left ribs.  There is an enteric tube with the tube coiled within the stomach and the tip directed towards the fundus. There is no bowel dilatation to suggest obstruction. There is no evidence of pneumoperitoneum, portal venous gas or pneumatosis. There are no pathologic calcifications along the expected course of the ureters.The osseous structures are unremarkable.  IMPRESSION: Malpositioned enteric tube.  Recommend repositioning.   Electronically Signed   By: Elige Ko   On: 02/04/2014 16:26   Ct Portable Head W/o Cm  02/03/2014   CLINICAL DATA:  Unresponsive.  EXAM: CT HEAD WITHOUT CONTRAST  TECHNIQUE: Contiguous axial images were obtained from the base of the skull through the vertex without intravenous contrast.  COMPARISON:  None.  FINDINGS: The basal ganglia and insular cortex appear to be intact bilaterally. No acute cortical infarct, hemorrhage, or mass lesion is present. There is significant artifactual no is on the scan due to portable technique. The ventricles are of normal size. No significant extra-axial fluid collection is present.  The patient is intubated. Minimal fluid is present in the left maxillary sinus. A small polyp or mucous retention cyst is present in the right maxillary sinus. Paranasal sinuses and mastoid air  cells are clear. The osseous skull is intact.  IMPRESSION: 1. Gray-white differentiation is preserved without evidence for diffuse edema or ischemic insult. 2. There significant artifact due to portable technique. Follow-up CT scan or repeat CT scan within the department may prove useful for increased sensitivity to progressed ischemic disease if present.   Electronically Signed   By: Gennette Pac M.D.   On: 02/03/2014 19:05    TELE NSR  ECG NSR   ECHO still pending  ASSESSMENT AND PLAN   No arrhythmia. BP low and on norepi iv in increasing doses, but may be hypovolemic - increase IV fluids Further cardiac evaluation will depend heavily on assessment of LV function and presence or absence of other cardiac abnormalities on echo, as well as her neurological status.   Thurmon Fair, MD, [facility] [phone] office [phone] pager 02/05/2014 9:16 AM

## 2014-02-05 NOTE — Progress Notes (Signed)
Brief Nutrition Note:  Pt with order for a feeding tube placement. Placed a Covidien Kangaroo feeding tube with IRIS technology at bedside with tube team. RN placed order for x-ray to confirm tube placement.  Swanson Farnell RD, LDN, CNSC [phone] Pager [phone] After Hours Pager   

## 2014-02-05 NOTE — Progress Notes (Addendum)
PULMONARY / CRITICAL CARE MEDICINE   Name: Jennifer [name] MRN: [id number] DOB: December 01, 1960    ADMISSION DATE:  February 22, 2014 CONSULTATION DATE: 02/03/2014  REFERRING MD :  Resident  CHIEF COMPLAINT:  Heart stopped  BRIEF PATIENT DESCRIPTION:  53 yo female admitted with nausea, vomiting, diarrhea, chills, abdominal pain likely from DM gastroparesis.  She has hx of multiple amputations >> had Rt 5th metatarsal amputation recently for osteomyelitis.  She developed PEA arrest 3/24, and transferred to ICU.  SIGNIFICANT EVENTS: 3/14 Admit 3/22 GI consult 3/24 PEA arrest >> transfer to ICU  STUDIES:  3/14 CT abd/pelvis >> high density material in stomach, medium density material in gallbladder, fibroid uterus 3/17 Abd u/s >> sludge ball in gallbladder 3/19 Gastric emptying study >> abnormal gastric emptying at 2 hrs 3/23 EGD >> severe reflux esophagitis  LINES / TUBES: ETT 3/24 >>  L IJ TLC 3/24>>> R radial a-line 3/24>>>  CULTURES: Blood 3/14 >> negative Urine 3/14 >> Klebsiella   ANTIBIOTICS: Levaquin >>   SUBJECTIVE: No events overnight.  VITAL SIGNS: Temp:  [91 F (32.8 C)-98.6 F (37 C)] 98.6 F (37 C) (03/26 1050) Pulse Rate:  [50-78] 74 (03/26 1050) Resp:  [11-32] 32 (03/26 1050) BP: (107-180)/(55-80) 129/57 mmHg (03/26 1000) SpO2:  [97 %-100 %] 100 % (03/26 1050) Arterial Line BP: (102-173)/(49-116) 102/49 mmHg (03/26 1050) FiO2 (%):  [30 %] 30 % (03/26 0904) Weight:  [133 lb 5.7 oz (60.49 kg)] 133 lb 5.7 oz (60.49 kg) (03/26 0500) HEMODYNAMICS: CVP:  [2 mmHg-9 mmHg] 2 mmHg VENTILATOR SETTINGS: Vent Mode:  [-] PRVC FiO2 (%):  [30 %] 30 % Set Rate:  [12 bmp] 12 bmp Vt Set:  [430 mL] 430 mL PEEP:  [5 cmH20] 5 cmH20 Plateau Pressure:  [14 cmH20-16 cmH20] 16 cmH20 INTAKE / OUTPUT: Intake/Output     03/25 0701 - 03/26 0700 03/26 0701 - 03/27 0700   P.O.     I.V. (mL/kg) 1252.4 (20.7) 49.9 (0.8)   NG/GT 30    IV Piggyback 250    Total Intake(mL/kg) 1532.4  (25.3) 49.9 (0.8)   Urine (mL/kg/hr) 410 (0.3)    Emesis/NG output 30 (0)    Stool 100 (0.1)    Total Output 540     Net +992.4 +49.9          PHYSICAL EXAMINATION: General:  Chronically ill appearing female, unresponsive. Neuro:  Pupils fixed and dilated, no dolls eye and no gag. HEENT:  /AT, pupils fixed and dilated.  MMM. Cardiovascular:  RRR, Nl S1/S2, -M/R/G. Lungs:  Coarse BS diffusely. Abdomen:  Soft, NT, ND and +BS. Musculoskeletal:  Amputation on right foot, wound clean. Skin:  Intact.  LABS:  CBC  Recent Labs Lab 02/03/14 2055 02/03/14 2100 02/04/14 0822 02/05/14 0400  WBC 9.7  --   --  9.9  HGB 10.4* 11.2* 9.9* 10.6*  HCT 29.7* 33.0* 29.0* 29.9*  PLT 264  --   --  284   Coag's  Recent Labs Lab 02/03/14 2108 02/04/14 0420  APTT 25 38*  INR 1.22 1.25   BMET  Recent Labs Lab 02/05/14 02/05/14 0400 02/05/14 0800  NA 142 141 142  K 3.3* 3.6* 3.5*  CL 106 104 105  CO2 22 21 22   BUN 18 18 18   CREATININE 1.96* 2.02* 2.09*  GLUCOSE 112* 136* 99   Electrolytes  Recent Labs Lab 01/30/14 0500  02/04/14 0400  02/05/14 02/05/14 0400 02/05/14 0800  CALCIUM 8.8  < > 9.2  < >  8.8 8.9 8.8  MG 2.2  --  1.5  --   --  1.8  --   PHOS  --   --  1.0*  --   --  5.0*  --   < > = values in this interval not displayed. Sepsis Markers  Recent Labs Lab 02/03/14 1755  LATICACIDVEN 12.5*   ABG  Recent Labs Lab 02/04/14 0429 02/04/14 0647 02/05/14 0428  PHART 7.569* 7.459* 7.373  PCO2ART 23.7* 31.5* 37.4  PO2ART 226.0* 154.0* 136.0*   Liver Enzymes No results found for this basename: AST, ALT, ALKPHOS, BILITOT, ALBUMIN,  in the last 168 hours Cardiac Enzymes  Recent Labs Lab 02/03/14 1713 02/03/14 2332 02/04/14 0420  TROPONINI <0.30 0.37* <0.30   Glucose  Recent Labs Lab 02/04/14 0929 02/04/14 1222 02/04/14 1610 02/04/14 1953 02/04/14 2355 02/05/14 0757  GLUCAP 76 124* 140* 129* 88 94   Imaging Dg Chest Port 1  View  02/05/2014   CLINICAL DATA:  Respiratory difficulty  EXAM: PORTABLE CHEST - 1 VIEW  COMPARISON:  DG CHEST 1V PORT dated 02/04/2014  FINDINGS: Endotracheal tube and NG tube are stable. Lungs are clear. Left internal jugular central venous catheter are stable.  IMPRESSION: No active cardiopulmonary disease.   Electronically Signed   By: Maryclare Bean M.D.   On: 02/05/2014 08:07   Dg Chest Port 1 View  02/04/2014   CLINICAL DATA:  Shortness of breath and evaluate endotracheal tube.  EXAM: PORTABLE CHEST - 1 VIEW  COMPARISON:  DG CHEST 1V PORT dated 02/03/2014  FINDINGS: Endotracheal tube is now 3.5 cm above the carina. Nasogastric tube in the upper stomach. Patient now has defibrillator pads overlying the left side of the chest. There is a left jugular central venous catheter. Central line tip in the upper SVC region. Negative for a pneumothorax. Lungs are clear without airspace disease or edema.  IMPRESSION: The endotracheal tube has been repositioned and it is appropriately positioned above the carina.  No focal lung disease.   Electronically Signed   By: Richarda Overlie M.D.   On: 02/04/2014 08:40   Dg Chest Port 1 View  02/03/2014   CLINICAL DATA:  Line placement  EXAM: PORTABLE CHEST - 1 VIEW  COMPARISON:  12/18/2013  FINDINGS: Right mainstem intubation, recommend retraction by 4 cm. Left IJ catheter, tip at the level of the distal SVC. No pneumothorax.  Clear lungs.  Normal heart size.  Mildly displaced anterolateral left third and fourth rib fractures.  Critical Value/emergent results were called by telephone at the time of interpretation on 02/03/2014 at 6:52 PM to RN [place], who verbally acknowledged these results.  IMPRESSION: 1. Right mainstem intubation. Recommend endotracheal tube retraction by 4 cm. 2. Left IJ catheter in good position.  No pneumothorax. 3. Left third and fourth rib fractures.   Electronically Signed   By: Tiburcio Pea M.D.   On: 02/03/2014 18:56   Dg Abd Portable 1v  02/04/2014    CLINICAL DATA:  Status post pyloric tube placement  EXAM: PORTABLE ABDOMEN - 1 VIEW  COMPARISON:  DG ABD 2 VIEWS dated 02/03/2014  FINDINGS: There is a nondisplaced fracture of the posterior tenth and eleventh left ribs.  There is an enteric tube with the tube coiled within the stomach and the tip directed towards the fundus. There is no bowel dilatation to suggest obstruction. There is no evidence of pneumoperitoneum, portal venous gas or pneumatosis. There are no pathologic calcifications along the expected course of the ureters.The  osseous structures are unremarkable.  IMPRESSION: Malpositioned enteric tube.  Recommend repositioning.   Electronically Signed   By: Elige Ko   On: 02/04/2014 16:26   Ct Portable Head W/o Cm  02/03/2014   CLINICAL DATA:  Unresponsive.  EXAM: CT HEAD WITHOUT CONTRAST  TECHNIQUE: Contiguous axial images were obtained from the base of the skull through the vertex without intravenous contrast.  COMPARISON:  None.  FINDINGS: The basal ganglia and insular cortex appear to be intact bilaterally. No acute cortical infarct, hemorrhage, or mass lesion is present. There is significant artifactual no is on the scan due to portable technique. The ventricles are of normal size. No significant extra-axial fluid collection is present.  The patient is intubated. Minimal fluid is present in the left maxillary sinus. A small polyp or mucous retention cyst is present in the right maxillary sinus. Paranasal sinuses and mastoid air cells are clear. The osseous skull is intact.  IMPRESSION: 1. Gray-white differentiation is preserved without evidence for diffuse edema or ischemic insult. 2. There significant artifact due to portable technique. Follow-up CT scan or repeat CT scan within the department may prove useful for increased sensitivity to progressed ischemic disease if present.   Electronically Signed   By: Gennette Pac M.D.   On: 02/03/2014 19:05   CXR: Pending.  ASSESSMENT /  PLAN:  PULMONARY A: Acute respiratory failure 2nd to PEA arrest P:   - Full vent support - F/u CXR, ABG - Adjust vent to ABG. - Lower ext dopplers negative.  CARDIOVASCULAR A:  PEA arrest, DDx includes intracranial event, PE vs primary cardiac event. Hx of HTN. P:  - Monitor hemodynamics. - Continue heparin gtt until able to r/o PE and for cardiac reasons. - Hold anti-HTN meds for now. - Pressors for BP support. - Cards consultation appreciated.  RENAL A:   Acute kidney injury. P:   - Monitor renal fx, urine outpt, electrolytes. - Lytes in AM. - Replace electrolytes as indicated.  GASTROINTESTINAL A:   DM gastroparesis with reflux esophagitis. P:   - Protonix - Consult nutrition for TF. - Keep HOB elevated > 30 degrees  HEMATOLOGIC A:   Anemia of chronic disease. P:  - F/u CBC.  INFECTIOUS A:   Osteomyelitis Rt foot. Klebsiella UTI on admission. P:   - Levaquin, finish course.  ENDOCRINE A:   Difficult to control DM.   P:   - SSI.  NEUROLOGIC A:   Acute encephalopathy 2nd to PE arrest. P:   - CT head noted. - Will call neuro in AM pending initial exam.  Family updated at length bedside.  I have personally obtained a history, examined the patient, evaluated laboratory and imaging results, formulated the assessment and plan and placed orders.  CRITICAL CARE: The patient is critically ill with multiple organ systems failure and requires high complexity decision making for assessment and support, frequent evaluation and titration of therapies, application of advanced monitoring technologies and extensive interpretation of multiple databases. Critical Care Time devoted to patient care services described in this note is 35 minutes.   Alyson Reedy, M.D. [facility] Pulmonary/Critical Care Medicine. Pager: [phone]. After hours pager: [phone].

## 2014-02-05 NOTE — Progress Notes (Signed)
NUTRITION FOLLOW UP  Intervention:   1. Enteral nutrition; Initiate Vital 1.2 @ 20 mL/hr continuous trophic feed once pt re-warmed. Advance by 10 mL/hr q 4 hrs to 40 mL/hr continuous to provide 1152 kcal, 72g protein, and 787 mL free water.   NUTRITION DIAGNOSIS:  Inadequate oral intake related to inability to eat as evidenced by NPO, vent.   Monitor:  1. Enteral nutrition; initiation with tolerance. Pt to meet >/=90% estimated needs with nutrition support.  2. Wt/wt change; monitor trends   Assessment:   Pt with h/o uncontrolled DM, multiple amputations, foot ulcer, gastroparesis admitted after PEA arrest.  Pt recently admitted with nausea, vomiting, resistant to IV reglan and other supportive care. Gastric emptying study was abnormal.  Post-pyloric tube placed for TFs.   Patient is currently intubated on ventilator support.  MV: 5.3 L/min Temp (24hrs), Avg:93.3 F (34.1 C), Min:91 F (32.8 C), Max:98.6 F (37 C)  Propofol: none  Height: Ht Readings from Last 1 Encounters:  02/03/14 5\' 6"  (1.676 m)    Weight Status:   Wt Readings from Last 1 Encounters:  02/05/14 133 lb 5.7 oz (60.49 kg)    Re-estimated needs:  Kcal: 1213  Protein: 73-84g  Fluid: ~1.7 L/day  Skin: non-pitting edema  Diet Order: NPO   Intake/Output Summary (Last 24 hours) at 02/05/14 1353 Last data filed at 02/05/14 0800  Gross per 24 hour  Intake 1162.7 ml  Output    365 ml  Net  797.7 ml    Last BM: 3/25  Labs:   Recent Labs Lab 01/30/14 0500  02/04/14 0209 02/04/14 0400  02/05/14 02/05/14 0400 02/05/14 0800  NA 138  < > 140 140  < > 142 141 142  K 3.2*  < > 3.1* 2.9*  < > 3.3* 3.6* 3.5*  CL 97  < > 102 102  < > 106 104 105  CO2 26  < > 21 21  < > 22 21 22   BUN 10  < > 17 17  < > 18 18 18   CREATININE 1.25*  < > 1.64* 1.71*  < > 1.96* 2.02* 2.09*  CALCIUM 8.8  < > 9.0 9.2  < > 8.8 8.9 8.8  MG 2.2  --   --  1.5  --   --  1.8  --   PHOS  --   --   --  1.0*  --   --  5.0*  --    GLUCOSE 133*  < > 254* 172*  < > 112* 136* 99  < > = values in this interval not displayed.  CBG (last 3)   Recent Labs  02/04/14 1953 02/04/14 2355 02/05/14 0757  GLUCAP 129* 88 94    Scheduled Meds: . antiseptic oral rinse  15 mL Mouth Rinse QID  . chlorhexidine  15 mL Mouth Rinse BID  . feeding supplement (VITAL AF 1.2 CAL)  1,000 mL Per Tube Q24H  . insulin aspart  2-6 Units Subcutaneous 6 times per day  . insulin glargine  10 Units Subcutaneous Q24H  . levofloxacin (LEVAQUIN) IV  500 mg Intravenous Q24H  . pantoprazole (PROTONIX) IV  40 mg Intravenous Q12H  . potassium chloride  40 mEq Per Tube Once    Continuous Infusions: . sodium chloride 100 mL/hr at 02/05/14 1000  . dextrose    . fentaNYL infusion INTRAVENOUS 50 mcg/hr (02/05/14 0418)  . heparin 600 Units/hr (02/05/14 0510)  . midazolam (VERSED) infusion 2 mg/hr (02/05/14 0540)  .  norepinephrine (LEVOPHED) Adult infusion 9 mcg/min (02/05/14 0600)    Loyce Dys, MS RD LDN Clinical Inpatient Dietitian Pager: [phone] Weekend/After hours pager: [phone]

## 2014-02-05 NOTE — Progress Notes (Signed)
ANTICOAGULATION CONSULT NOTE - Follow Up Consult  Pharmacy Consult for Heparin Indication: r/o ACS vs PE (s/p PEA arrest)  No Known Allergies Patient Measurements: Height: 5\' 6"  (167.6 cm) Weight: 133 lb 5.7 oz (60.49 kg) (weight with pads minus 1.41 ( pad weight)) IBW/kg (Calculated) : 59.3  Vital Signs: Temp: 98.6 F (37 C) (03/26 1050) Temp src: Core (Comment) (03/26 1050) BP: 103/52 mmHg (03/26 1535) Pulse Rate: 77 (03/26 1535) Labs:  Recent Labs  02/03/14 1713 02/03/14 2055 02/03/14 2100 02/03/14 2108 02/03/14 2332  02/04/14 0420 02/04/14 0822  02/04/14 1700  02/05/14 02/05/14 0300 02/05/14 0400 02/05/14 0800 02/05/14 1345  HGB  --  10.4* 11.2*  --   --   --   --  9.9*  --   --   --   --   --  10.6*  --   --   HCT  --  29.7* 33.0*  --   --   --   --  29.0*  --   --   --   --   --  29.9*  --   --   PLT  --  264  --   --   --   --   --   --   --   --   --   --   --  284  --   --   APTT  --   --   --  25  --   --  38*  --   --   --   --   --   --   --   --   --   LABPROT  --   --   --  15.1  --   --  15.4*  --   --   --   --   --   --   --   --   --   INR  --   --   --  1.22  --   --  1.25  --   --   --   --   --   --   --   --   --   HEPARINUNFRC  --   --   --   --   --   --  0.23*  --   < > 0.53  --   --  0.53  --   --  0.30  CREATININE 1.54*  --  1.90* 1.68*  --   < >  --  1.80*  < >  --   < > 1.96*  --  2.02* 2.09*  --   TROPONINI <0.30  --   --   --  0.37*  --  <0.30  --   --   --   --   --   --   --   --   --   < > = values in this interval not displayed.  Estimated Creatinine Clearance: 29.8 ml/min (by C-G formula based on Cr of 2.09).  Medications:  Heparin at 600 units/hr  Assessment: 53 YOM on IV heparin for possible thrombo-embolic cause of PEA arrest.  to start rewarming at 2130 PM tonight. Heparin level remains therapeutic but trending down to low end of normal (0.3) on rate of 600 units/hr. Pt has completed rewarming -ended about 1000. Suspect lower  level due to pt clearing heparin more rapidly. No bleeding reported.   Goal of Therapy:  Heparin level 0.3-0.7  units/ml Monitor platelets by anticoagulation protocol: Yes   Plan:  1. Increase heparin to 700 units/hr. 2. Follow-up a.m. heparin level  Christoper Fabian, PharmD, BCPS Clinical pharmacist, pager [phone] 02/05/2014,4:28 PM

## 2014-02-05 NOTE — Progress Notes (Signed)
Echocardiogram 2D Echocardiogram has been performed.  Jennifer [name], Jennifer [name] M 02/05/2014, 9:45 AM

## 2014-02-05 NOTE — Progress Notes (Signed)
Nursing: Wasted Versed 1mg /ml -25ml total down sink as witnessed by Bronson CurbHannah Padgett RN and this RN .

## 2014-02-06 ENCOUNTER — Inpatient Hospital Stay (HOSPITAL_COMMUNITY): Payer: BC Managed Care – PPO

## 2014-02-06 DIAGNOSIS — G931 Anoxic brain damage, not elsewhere classified: Secondary | ICD-10-CM

## 2014-02-06 LAB — CBC
HEMATOCRIT: 27.1 % — AB (ref 36.0–46.0)
HEMOGLOBIN: 9.3 g/dL — AB (ref 12.0–15.0)
MCH: 30.4 pg (ref 26.0–34.0)
MCHC: 34.3 g/dL (ref 30.0–36.0)
MCV: 88.6 fL (ref 78.0–100.0)
Platelets: 196 10*3/uL (ref 150–400)
RBC: 3.06 MIL/uL — ABNORMAL LOW (ref 3.87–5.11)
RDW: 14 % (ref 11.5–15.5)
WBC: 7.2 10*3/uL (ref 4.0–10.5)

## 2014-02-06 LAB — BLOOD GAS, ARTERIAL
ACID-BASE DEFICIT: 3.4 mmol/L — AB (ref 0.0–2.0)
Bicarbonate: 20.8 mEq/L (ref 20.0–24.0)
Drawn by: 33176
FIO2: 0.3 %
O2 Saturation: 99.1 %
PATIENT TEMPERATURE: 98.6
PEEP: 5 cmH2O
RATE: 12 resp/min
TCO2: 21.9 mmol/L (ref 0–100)
VT: 400 mL
pCO2 arterial: 35.2 mmHg (ref 35.0–45.0)
pH, Arterial: 7.389 (ref 7.350–7.450)
pO2, Arterial: 155 mmHg — ABNORMAL HIGH (ref 80.0–100.0)

## 2014-02-06 LAB — BASIC METABOLIC PANEL
BUN: 21 mg/dL (ref 6–23)
CALCIUM: 8.5 mg/dL (ref 8.4–10.5)
CO2: 21 meq/L (ref 19–32)
Chloride: 113 mEq/L — ABNORMAL HIGH (ref 96–112)
Creatinine, Ser: 2.15 mg/dL — ABNORMAL HIGH (ref 0.50–1.10)
GFR calc Af Amer: 29 mL/min — ABNORMAL LOW (ref >90)
GFR calc non Af Amer: 25 mL/min — ABNORMAL LOW (ref >90)
GLUCOSE: 120 mg/dL — AB (ref 70–99)
POTASSIUM: 4.3 meq/L (ref 3.7–5.3)
Sodium: 145 mEq/L (ref 137–147)

## 2014-02-06 LAB — GLUCOSE, CAPILLARY
GLUCOSE-CAPILLARY: 82 mg/dL (ref 70–99)
GLUCOSE-CAPILLARY: 140 mg/dL — AB (ref 70–99)
GLUCOSE-CAPILLARY: 142 mg/dL — AB (ref 70–99)
Glucose-Capillary: 144 mg/dL — ABNORMAL HIGH (ref 70–99)
Glucose-Capillary: 153 mg/dL — ABNORMAL HIGH (ref 70–99)
Glucose-Capillary: 91 mg/dL (ref 70–99)
Glucose-Capillary: 95 mg/dL (ref 70–99)

## 2014-02-06 LAB — HEPARIN LEVEL (UNFRACTIONATED)
HEPARIN UNFRACTIONATED: 0.23 [IU]/mL — AB (ref 0.30–0.70)
Heparin Unfractionated: 0.26 IU/mL — ABNORMAL LOW (ref 0.30–0.70)
Heparin Unfractionated: <0.1 IU/mL — ABNORMAL LOW (ref 0.30–0.70)

## 2014-02-06 LAB — MAGNESIUM: Magnesium: 2 mg/dL (ref 1.5–2.5)

## 2014-02-06 LAB — PHOSPHORUS: PHOSPHORUS: 2.8 mg/dL (ref 2.3–4.6)

## 2014-02-06 MED ORDER — SODIUM CHLORIDE 0.9 % IV BOLUS (SEPSIS)
750.0000 mL | Freq: Once | INTRAVENOUS | Status: AC
  Administered 2014-02-06: 750 mL via INTRAVENOUS

## 2014-02-06 MED ORDER — HEPARIN (PORCINE) IN NACL 100-0.45 UNIT/ML-% IJ SOLN
900.0000 [IU]/h | INTRAMUSCULAR | Status: DC
  Administered 2014-02-07: 1200 [IU]/h via INTRAVENOUS
  Filled 2014-02-06 (×2): qty 250

## 2014-02-06 MED ORDER — LEVOFLOXACIN IN D5W 250 MG/50ML IV SOLN
250.0000 mg | INTRAVENOUS | Status: DC
  Administered 2014-02-06 – 2014-02-07 (×2): 250 mg via INTRAVENOUS
  Filled 2014-02-06 (×3): qty 50

## 2014-02-06 MED FILL — Medication: Qty: 1 | Status: AC

## 2014-02-06 NOTE — Progress Notes (Signed)
ANTICOAGULATION CONSULT NOTE - Follow Up Consult  Pharmacy Consult for Heparin Indication: r/o ACS vs PE (s/p PEA arrest)  No Known Allergies  Patient Measurements: Height: 5\' 6"  (167.6 cm) Weight: 140 lb 3.4 oz (63.6 kg) IBW/kg (Calculated) : 59.3 Heparin Dosing Weight: 63.6kg  Vital Signs: Temp: 99.5 F (37.5 C) (03/27 1800) Temp src: Core (Comment) (03/27 1144) BP: 141/72 mmHg (03/27 2012) Pulse Rate: 112 (03/27 2012)  Labs:  Recent Labs  02/03/14 2332  02/04/14 0420  02/04/14 0822  02/05/14 0400 02/05/14 0800  02/06/14 0500 02/06/14 1400 02/06/14 2040  HGB  --   --   --   < > 9.9*  --  10.6*  --   --  9.3*  --   --   HCT  --   --   --   --  29.0*  --  29.9*  --   --  27.1*  --   --   PLT  --   --   --   --   --   --  284  --   --  196  --   --   APTT  --   --  38*  --   --   --   --   --   --   --   --   --   LABPROT  --   --  15.4*  --   --   --   --   --   --   --   --   --   INR  --   --  1.25  --   --   --   --   --   --   --   --   --   HEPARINUNFRC  --   --  0.23*  --   --   < >  --   --   < > 0.26* 0.23* <0.10*  CREATININE  --   < >  --   --  1.80*  < > 2.02* 2.09*  --  2.15*  --   --   TROPONINI 0.37*  --  <0.30  --   --   --   --   --   --   --   --   --   < > = values in this interval not displayed.  Estimated Creatinine Clearance: 29 ml/min (by C-G formula based on Cr of 2.15).   Assessment: 53yof continues on heparin for possible ACS vs PE s/p cardiac arrest, arctic sun protocol. Now fully rewarmed. Heparin level dropped despite rate increase this morning - she appears to be clearing it faster since rewarmed. Slight drop in Hgb noted, platelets stable, no bleeding reported.  Goal of Therapy:  Heparin level 0.3-0.7 units/ml Monitor platelets by anticoagulation protocol: Yes   Plan:  1) Increase heparin to 1200 units/hr 2) AM labs  Thank you. Okey RegalLisa Stellarose Cerny, PharmD [phone]  Elwin SleightPowell, Nicholes Hibler Kay 02/06/2014,9:53 PM

## 2014-02-06 NOTE — Progress Notes (Signed)
ANTICOAGULATION CONSULT NOTE - Follow Up Consult  Pharmacy Consult for heparin Indication: s/p PEA arrest w/ unknown etiology  Labs:  Recent Labs  02/03/14 1713 02/03/14 2055 02/03/14 2100 02/03/14 2108 02/03/14 2332  02/04/14 0420 02/04/14 0822  02/05/14 02/05/14 0300 02/05/14 0400 02/05/14 0800 02/05/14 1345 02/06/14 0500  HGB  --  10.4* 11.2*  --   --   --   --  9.9*  --   --   --  10.6*  --   --  9.3*  HCT  --  29.7* 33.0*  --   --   --   --  29.0*  --   --   --  29.9*  --   --  27.1*  PLT  --  264  --   --   --   --   --   --   --   --   --  284  --   --  196  APTT  --   --   --  25  --   --  38*  --   --   --   --   --   --   --   --   LABPROT  --   --   --  15.1  --   --  15.4*  --   --   --   --   --   --   --   --   INR  --   --   --  1.22  --   --  1.25  --   --   --   --   --   --   --   --   HEPARINUNFRC  --   --   --   --   --   --  0.23*  --   < >  --  0.53  --   --  0.30 0.26*  CREATININE 1.54*  --  1.90* 1.68*  --   < >  --  1.80*  < > 1.96*  --  2.02* 2.09*  --   --   TROPONINI <0.30  --   --   --  0.37*  --  <0.30  --   --   --   --   --   --   --   --   < > = values in this interval not displayed.   Assessment: 53yo female now w/ subtherapeutic heparin level despite increased rate last pm, now clearing heparin faster after rewarming.  Goal of Therapy:  Heparin level 0.3-0.7 units/ml   Plan:  Will increase heparin gtt by 1-2 units/kg/hr to 800 units/hr and check level in 8hr.  Vernard GamblesVeronda Anzal Bartnick, PharmD, BCPS  02/06/2014,5:57 AM

## 2014-02-06 NOTE — Progress Notes (Signed)
NUTRITION FOLLOW UP  Intervention:   1. Enteral nutrition; Continue Vital 1.2 @ 40 mL/hr continuous to provide 1152 kcal, 72g protein, and 787 mL free water.   NUTRITION DIAGNOSIS:  Inadequate oral intake related to inability to eat as evidenced by NPO, vent.   Monitor:  1. Enteral nutrition; initiation with tolerance. Pt to meet >/=90% estimated needs with nutrition support.  2. Wt/wt change; monitor trends  Assessment:   Pt with h/o uncontrolled DM, multiple amputations, foot ulcer, gastroparesis admitted after PEA arrest.  Pt recently admitted with nausea, vomiting, resistant to IV reglan and other supportive care. Gastric emptying study was abnormal.  Post-pyloric tube placed for TFs.   Patient is currently intubated on ventilator support.  MV: 5.3 L/min Temp (24hrs), Avg:99.4 F (37.4 C), Min:96.3 F (35.7 C), Max:101.7 F (38.7 C)  Propofol: none  Neuro consulted for prognosis.   Height: Ht Readings from Last 1 Encounters:  02/03/14 5\' 6"  (1.676 m)    Weight Status:   Wt Readings from Last 1 Encounters:  02/06/14 140 lb 3.4 oz (63.6 kg)    Re-estimated needs:  Kcal: 1213  Protein: 73-84g  Fluid: ~1.7 L/day  Skin: non-pitting edema  Diet Order: NPO   Intake/Output Summary (Last 24 hours) at 02/06/14 1457 Last data filed at 02/06/14 1400  Gross per 24 hour  Intake 3977.6 ml  Output    600 ml  Net 3377.6 ml    Last BM: 3/25  Labs:   Recent Labs Lab 02/04/14 0400  02/05/14 0400 02/05/14 0800 02/06/14 0500  NA 140  < > 141 142 145  K 2.9*  < > 3.6* 3.5* 4.3  CL 102  < > 104 105 113*  CO2 21  < > 21 22 21   BUN 17  < > 18 18 21   CREATININE 1.71*  < > 2.02* 2.09* 2.15*  CALCIUM 9.2  < > 8.9 8.8 8.5  MG 1.5  --  1.8  --  2.0  PHOS 1.0*  --  5.0*  --  2.8  GLUCOSE 172*  < > 136* 99 120*  < > = values in this interval not displayed.  CBG (last 3)   Recent Labs  02/06/14 0349 02/06/14 0732 02/06/14 1148  GLUCAP 95 140* 142*     Scheduled Meds: . antiseptic oral rinse  15 mL Mouth Rinse QID  . chlorhexidine  15 mL Mouth Rinse BID  . feeding supplement (VITAL AF 1.2 CAL)  1,000 mL Per Tube Q24H  . insulin aspart  2-6 Units Subcutaneous 6 times per day  . insulin glargine  10 Units Subcutaneous Q24H  . levofloxacin (LEVAQUIN) IV  250 mg Intravenous Q24H  . pantoprazole (PROTONIX) IV  40 mg Intravenous Q12H    Continuous Infusions: . sodium chloride 100 mL/hr (02/06/14 0659)  . dextrose    . fentaNYL infusion INTRAVENOUS Stopped (02/05/14 1100)  . heparin 800 Units/hr (02/06/14 1436)  . midazolam (VERSED) infusion 2 mg/hr (02/05/14 0540)  . norepinephrine (LEVOPHED) Adult infusion 1 mcg/min (02/06/14 1451)    Loyce DysKacie Hagan Maltz, MS RD LDN Clinical Inpatient Dietitian Pager: [phone] Weekend/After hours pager: [phone]

## 2014-02-06 NOTE — Progress Notes (Signed)
Patient Name: Jennifer [name] Date of Encounter: 02/06/2014  Active Problems:   Shock liver   Gastroparesis due to DM   Hypertension   Type II or unspecified type diabetes mellitus with unspecified complication, uncontrolled   Neuropathic diabetic ulcer of foot   History of amputation of lesser toe of right foot   Amputation of second toe, right, traumatic   Vomiting and diarrhea   Nausea   Acute renal failure   Cardiac arrest   Cardiogenic shock   Acute respiratory failure   Altered mental status   Length of Stay: 13  SUBJECTIVE  No purposeful movement or response.  CURRENT MEDS . antiseptic oral rinse  15 mL Mouth Rinse QID  . chlorhexidine  15 mL Mouth Rinse BID  . feeding supplement (VITAL AF 1.2 CAL)  1,000 mL Per Tube Q24H  . insulin aspart  2-6 Units Subcutaneous 6 times per day  . insulin glargine  10 Units Subcutaneous Q24H  . levofloxacin (LEVAQUIN) IV  500 mg Intravenous Q24H  . pantoprazole (PROTONIX) IV  40 mg Intravenous Q12H    OBJECTIVE   Intake/Output Summary (Last 24 hours) at 02/06/14 0940 Last data filed at 02/06/14 0700  Gross per 24 hour  Intake 3019.9 ml  Output    775 ml  Net 2244.9 ml   Filed Weights   02/03/14 2045 02/05/14 0500 02/06/14 0405  Weight: 56.5 kg (124 lb 9 oz) 60.49 kg (133 lb 5.7 oz) 63.6 kg (140 lb 3.4 oz)    PHYSICAL EXAM Filed Vitals:   02/06/14 0436 02/06/14 0500 02/06/14 0600 02/06/14 0700  BP: 98/57  123/67 107/56  Pulse: 103 105 106 108  Temp:  99.9 F (37.7 C) 100.2 F (37.9 C) 100.8 F (38.2 C)  TempSrc:      Resp: 25 24 24 24   Height:      Weight:      SpO2: 100% 100% 100% 100%   General: intubated, unresponsive, myoclonic jerks Head: no evidence of trauma, PERRL, EOMI, no exophtalmos or lid lag, no myxedema, no xanthelasma; normal ears, nose and oropharynx Neck: normal jugular venous pulsations and no hepatojugular reflux; brisk carotid pulses without delay and no carotid bruits Chest: clear to  auscultation, no signs of consolidation by percussion or palpation, normal fremitus, symmetrical and full respiratory excursions Cardiovascular: normal position and quality of the apical impulse, regular rhythm, normal first and second heart sounds, no rubs or gallops, no murmur Abdomen: no tenderness or distention, no masses by palpation, no abnormal pulsatility or arterial bruits, normal bowel sounds, no hepatosplenomegaly Extremities: no clubbing, cyanosis or edema; 2+ radial, ulnar and brachial pulses bilaterally; 2+ right femoral, posterior tibial and dorsalis pedis pulses; 2+ left femoral, posterior tibial and dorsalis pedis pulses; no subclavian or femoral bruits Neurological: myoclonus - hiccupping motion, otherwise no movement or response.  LABS  CBC  Recent Labs  02/05/14 0400 02/06/14 0500  WBC 9.9 7.2  HGB 10.6* 9.3*  HCT 29.9* 27.1*  MCV 86.7 88.6  PLT 284 196   Basic Metabolic Panel  Recent Labs  02/05/14 0400 02/05/14 0800 02/06/14 0500  NA 141 142 145  K 3.6* 3.5* 4.3  CL 104 105 113*  CO2 21 22 21   GLUCOSE 136* 99 120*  BUN 18 18 21   CREATININE 2.02* 2.09* 2.15*  CALCIUM 8.9 8.8 8.5  MG 1.8  --  2.0  PHOS 5.0*  --  2.8    Radiology Studies Dg Abd 1 View  02/05/2014  CLINICAL DATA:  Hand up placement  EXAM: ABDOMEN - 1 VIEW  COMPARISON:  02/04/2014  FINDINGS: The feeding catheter is been manipulated into the second portion of the duodenum. 1 min 37 seconds of fluoroscopy was utilized. Contrast injection confirms catheter position.   Electronically Signed   By: Alcide CleverMark  Lukens M.D.   On: 02/05/2014 12:44   Dg Chest Port 1 View  02/06/2014   CLINICAL DATA:  Indication  EXAM: PORTABLE CHEST - 1 VIEW  COMPARISON:  DG CHEST 1V PORT dated 02/05/2014  FINDINGS: Endotracheal tube is appropriately positioned. Nasogastric tube tip terminates below the level of the hemidiaphragms but is not included in the field of view. Feeding tube also terminating below the level of  the diaphragms but the tip is not included in the field of view. Left IJ central line tip terminates over the mid SVC. Heart size is normal. The lungs are clear.  IMPRESSION: No acute cardiopulmonary process.  Support apparatus as above.   Electronically Signed   By: Christiana PellantGretchen  Green M.D.   On: 02/06/2014 08:17   Dg Chest Port 1 View  02/05/2014   CLINICAL DATA:  Respiratory difficulty  EXAM: PORTABLE CHEST - 1 VIEW  COMPARISON:  DG CHEST 1V PORT dated 02/04/2014  FINDINGS: Endotracheal tube and NG tube are stable. Lungs are clear. Left internal jugular central venous catheter are stable.  IMPRESSION: No active cardiopulmonary disease.   Electronically Signed   By: Maryclare BeanArt  Hoss M.D.   On: 02/05/2014 08:07   Dg Abd Portable 1v  02/04/2014   CLINICAL DATA:  Status post pyloric tube placement  EXAM: PORTABLE ABDOMEN - 1 VIEW  COMPARISON:  DG ABD 2 VIEWS dated 02/03/2014  FINDINGS: There is a nondisplaced fracture of the posterior tenth and eleventh left ribs.  There is an enteric tube with the tube coiled within the stomach and the tip directed towards the fundus. There is no bowel dilatation to suggest obstruction. There is no evidence of pneumoperitoneum, portal venous gas or pneumatosis. There are no pathologic calcifications along the expected course of the ureters.The osseous structures are unremarkable.  IMPRESSION: Malpositioned enteric tube.  Recommend repositioning.   Electronically Signed   By: Elige KoHetal  Patel   On: 02/04/2014 16:26   Dg Basil DessNaso G Tube Plc W/fl-no Rad  02/05/2014   CLINICAL DATA: portable Fluoro guided NGT placement, for post pyloric   NASO G TUBE PLACEMENT WITH FLUORO  Fluoroscopy was utilized by the requesting physician.  No radiographic  interpretation.     TELE NSR/mild sinus tachy  ASSESSMENT AND PLAN Poor prognosis. No evidence of structural heart disease. Will sign off, please reconsult as needed.   Thurmon FairMihai Kannon Baum, MD, [facility] Hospital[facility]ACC [facility] [phone] office [phone]  pager 02/06/2014 9:40 AM

## 2014-02-06 NOTE — Procedures (Signed)
EEG report.  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 Patient is comatose on the vent, off sedatives. Technique: this is a 17 channel routine scalp EEG performed at the bedside in the ICU setting, with bipolar and monopolar montages arranged in accordance to the international 10/20 system of electrode placement. One channel was dedicated to EKG recording.  The patient is intubated on the vent. No activating procedures performed.  Description: the overall recording is characterized by generalized markedly suppressed electrical cortical brain activity. Impression: this is an abnormal EEG performed without employing the brain death protocol which demonstrates evidence of diffuse electrocerebral silence.  Clinical correlation is advised.  Jennifer Portelasvaldo Camilo, MD

## 2014-02-06 NOTE — Progress Notes (Signed)
Nursing: Wasted Versed 1 mg /ml 45 ml total down sink and fentanyl 10 mcg/ml total of 70 ml wasted in sink. Witnessed by this RN and Alverda SkeansSandra Merlin RN

## 2014-02-06 NOTE — Progress Notes (Signed)
Utilization review completed.  

## 2014-02-06 NOTE — Progress Notes (Signed)
EEG Completed; Results Pending  

## 2014-02-06 NOTE — Progress Notes (Signed)
ANTICOAGULATION CONSULT NOTE - Follow Up Consult  Pharmacy Consult for Heparin Indication: r/o ACS vs PE (s/p PEA arrest)  No Known Allergies  Patient Measurements: Height: 5\' 6"  (167.6 cm) Weight: 140 lb 3.4 oz (63.6 kg) IBW/kg (Calculated) : 59.3 Heparin Dosing Weight: 63.6kg  Vital Signs: Temp: 99.7 F (37.6 C) (03/27 1300) Temp src: Core (Comment) (03/27 1144) BP: 95/50 mmHg (03/27 1400) Pulse Rate: 91 (03/27 1400)  Labs:  Recent Labs  02/03/14 1713 02/03/14 2055 02/03/14 2100 02/03/14 2108 02/03/14 2332  02/04/14 0420 02/04/14 0822  02/05/14 0300 02/05/14 0400 02/05/14 0800 02/05/14 1345 02/06/14 0500  HGB  --  10.4* 11.2*  --   --   --   --  9.9*  --   --  10.6*  --   --  9.3*  HCT  --  29.7* 33.0*  --   --   --   --  29.0*  --   --  29.9*  --   --  27.1*  PLT  --  264  --   --   --   --   --   --   --   --  284  --   --  196  APTT  --   --   --  25  --   --  38*  --   --   --   --   --   --   --   LABPROT  --   --   --  15.1  --   --  15.4*  --   --   --   --   --   --   --   INR  --   --   --  1.22  --   --  1.25  --   --   --   --   --   --   --   HEPARINUNFRC  --   --   --   --   --   --  0.23*  --   < > 0.53  --   --  0.30 0.26*  CREATININE 1.54*  --  1.90* 1.68*  --   < >  --  1.80*  < >  --  2.02* 2.09*  --  2.15*  TROPONINI <0.30  --   --   --  0.37*  --  <0.30  --   --   --   --   --   --   --   < > = values in this interval not displayed.  Estimated Creatinine Clearance: 29 ml/min (by C-G formula based on Cr of 2.15).   Medications:  Heparin @ 800 units/hr  Assessment: 53yof continues on heparin for possible ACS vs PE s/p cardiac arrest, arctic sun protocol. Now fully rewarmed. Heparin level dropped despite rate increase this morning - she appears to be clearing it faster since rewarmed. Slight drop in Hgb noted, platelets stable, no bleeding reported.  Goal of Therapy:  Heparin level 0.3-0.7 units/ml Monitor platelets by anticoagulation  protocol: Yes   Plan:  1) Increase heparin to 1000 units/hr 2) Check heparin level in 6 hours  Fredrik RiggerMarkle, Schwanda Zima Sue 02/06/2014,3:01 PM

## 2014-02-06 NOTE — Consult Note (Addendum)
NEURO HOSPITALIST CONSULT NOTE    Reason for Consult: prognosis  HPI:                                                                                                                                          Jennifer [name] is an 53 y.o. 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  She presented to the emergency room on 01/24/2049 with complaints of nausea vomiting and diarrhea. She had a CT scan of abdomen and pelvis without contrast performed on admission which showed no acute abnormality. EGD was obtained showing sever reflux esophagitis. The day after EGD 02/03/14 patient was found unresponsive slumped in chair.  Time of onset and duration unknown.  Code blue was called. Patient found to be in PEA. CPR for 15 minutes. patient was successfully resuscitated. Neurology was called for prognostication.  Patient currently 3 days S/P resuscitation.  Currently not on sedation  Past Medical History  Diagnosis Date  . Diabetes mellitus   . Ulcer of lower limb, unspecified     ulcer on top of foot (right)  . Slow transit constipation   . Disorder of bone and cartilage, unspecified   . Unspecified essential hypertension   . Orthostatic hypotension   . Reflux esophagitis   . Gastroparesis   . Irritable bowel syndrome   . Flatulence, eructation, and gas pain   . Complication of anesthesia   . PONV (postoperative nausea and vomiting)     Past Surgical History  Procedure Laterality Date  . Esophagogastroduodenoscopy  08/11/2012    Procedure: ESOPHAGOGASTRODUODENOSCOPY (EGD);  Surgeon: Shirley Friar, MD;  Location: [facility];  Service: Endoscopy;  Laterality: N/A;  . Tubal ligation     . Toe amputation Left 11/09/2013    partial amputation of great left toe   . Amputation Right 11/07/2013    Procedure: AMPUTATION DIGIT - Right great toe;  Surgeon: Cheral Almas, MD;  Location: [facility] OR;  Service: Orthopedics;  Laterality: Right;  . Amputation Left 11/09/2013    Procedure: AMPUTATION DIGIT - left great toe;  Surgeon: Cheral Almas, MD;  Location: [facility] OR;  Service: Orthopedics;  Laterality: Left;  . Amputation Right 01/13/2014    Procedure: AMPUTATION RIGHT FIFTH TOE AND RIGHT SECOND TOE;  Surgeon: Valeria Batman, MD;  Location: [facility] OR;  Service: Orthopedics;  Laterality: Right;  . Esophagogastroduodenoscopy N/A 01/20/2014    Procedure: ESOPHAGOGASTRODUODENOSCOPY (EGD);  Surgeon: Willis Modena, MD;  Location: [facility] ENDOSCOPY;  Service: Endoscopy;  Laterality: N/A;    Family History  Problem Relation Age of  Onset  . Diabetes Mother   . Alzheimer's disease Father      Social History:  reports that she has never smoked. She has never used smokeless tobacco. She reports that she does not drink alcohol or use illicit drugs.  No Known Allergies  MEDICATIONS:                                                                                                                     Scheduled: . antiseptic oral rinse  15 mL Mouth Rinse QID  . chlorhexidine  15 mL Mouth Rinse BID  . feeding supplement (VITAL AF 1.2 CAL)  1,000 mL Per Tube Q24H  . insulin aspart  2-6 Units Subcutaneous 6 times per day  . insulin glargine  10 Units Subcutaneous Q24H  . levofloxacin (LEVAQUIN) IV  250 mg Intravenous Q24H  . pantoprazole (PROTONIX) IV  40 mg Intravenous Q12H     ROS:                                                                                                                                       History obtained from unobtainable from patient due to mental status    Blood pressure 120/69, pulse 81, temperature 100.6 F (38.1 C), temperature source Core (Comment), resp.  rate 20, height 5\' 6"  (1.676 m), weight 63.6 kg (140 lb 3.4 oz), last menstrual period 02/09/2011, SpO2 100.00%.   Neurologic Examination:                                                                                                      Mental Status: Patient does not respond to verbal stimuli.  Does not respond to deep sternal rub.  Does not follow commands.  No verbalizations noted.  Cranial Nerves: II: patient does not respond confrontation bilaterally, pupils right 2 mm, left 2 mm,and sluggish reaction bilaterally III,IV,VI: doll's response absent bilaterally.  V,VII: corneal reflex absent bilaterally  VIII: patient does not respond to verbal stimuli IX,X:  gag reflex absent, XI: trapezius strength unable to test bilaterally XII: tongue strength unable to test Motor: Extremities flaccid throughout.  No spontaneous movement noted.  No purposeful movements noted. Sensory: Does not respond to noxious stimuli in any extremity. Deep Tendon Reflexes:  Absent with exception of bilateral brachioradialis 2+ Plantars: absent bilaterally Cerebellar: Unable to perform    Lab Results: Basic Metabolic Panel:  Recent Labs Lab 02/04/14 0209 02/04/14 0400  02/04/14 1900 02/05/14 02/05/14 0400 02/05/14 0800 02/06/14 0500  NA 140 140  < > 141 142 141 142 145  K 3.1* 2.9*  < > 3.2* 3.3* 3.6* 3.5* 4.3  CL 102 102  < > 104 106 104 105 113*  CO2 21 21  < > 21 22 21 22 21   GLUCOSE 254* 172*  < > 146* 112* 136* 99 120*  BUN 17 17  < > 18 18 18 18 21   CREATININE 1.64* 1.71*  < > 1.89* 1.96* 2.02* 2.09* 2.15*  CALCIUM 9.0 9.2  < > 8.8 8.8 8.9 8.8 8.5  MG  --  1.5  --   --   --  1.8  --  2.0  PHOS  --  1.0*  --   --   --  5.0*  --  2.8  < > = values in this interval not displayed.  Liver Function Tests: No results found for this basename: AST, ALT, ALKPHOS, BILITOT, PROT, ALBUMIN,  in the last 168 hours No results found for this basename: LIPASE, AMYLASE,  in the last 168 hours No  results found for this basename: AMMONIA,  in the last 168 hours  CBC:  Recent Labs Lab 02/03/14 2055 02/03/14 2100 02/04/14 0822 02/05/14 0400 02/06/14 0500  WBC 9.7  --   --  9.9 7.2  HGB 10.4* 11.2* 9.9* 10.6* 9.3*  HCT 29.7* 33.0* 29.0* 29.9* 27.1*  MCV 86.6  --   --  86.7 88.6  PLT 264  --   --  284 196    Cardiac Enzymes:  Recent Labs Lab 02/03/14 1713 02/03/14 2332 02/04/14 0420  TROPONINI <0.30 0.37* <0.30    Lipid Panel: No results found for this basename: CHOL, TRIG, HDL, CHOLHDL, VLDL, LDLCALC,  in the last 168 hours  CBG:  Recent Labs Lab 02/05/14 1927 02/06/14 0046 02/06/14 0349 02/06/14 0732 02/06/14 1148  GLUCAP 82 144* 95 140* 142*    Microbiology: Results for orders placed during the hospital encounter of 02/07/2014  CULTURE, BLOOD (ROUTINE X 2)     Status: None   Collection Time    01/14/2014  7:25 PM      Result Value Ref Range Status   Specimen Description BLOOD LEFT HAND   Final   Special Requests BOTTLES DRAWN AEROBIC ONLY [facility]   Final   Culture  Setup Time     Final   Value: 02/01/2014 23:41     Performed at [facility]   Culture     Final   Value: NO GROWTH 5 DAYS     Performed at [facility]   Report Status 01/30/2014 FINAL   Final  CULTURE, BLOOD (ROUTINE X 2)     Status: None   Collection Time    01/21/2014  7:40 PM      Result Value Ref Range Status   Specimen Description BLOOD RIGHT ANTECUBITAL   Final   Special Requests BOTTLES DRAWN AEROBIC ONLY [facility]   Final   Culture  Setup Time  Final   Value: 01-29-2014 23:41     Performed at [facility]   Culture     Final   Value: NO GROWTH 5 DAYS     Performed at [facility]   Report Status 01/30/2014 FINAL   Final  URINE CULTURE     Status: None   Collection Time    01/29/2014  8:51 PM      Result Value Ref Range Status   Specimen Description URINE, CLEAN CATCH   Final   Special Requests NONE   Final   Culture  Setup Time     Final   Value:  2014-01-29 21:40     Performed at [facility] Count     Final   Value: >=100,000 COLONIES/ML     Performed at [facility]   Culture     Final   Value: KLEBSIELLA OXYTOCA     Performed at [facility]   Report Status 01/27/2014 FINAL   Final   Organism ID, Bacteria KLEBSIELLA OXYTOCA   Final  CLOSTRIDIUM DIFFICILE BY PCR     Status: None   Collection Time    02/03/14  8:20 PM      Result Value Ref Range Status   C difficile by pcr NEGATIVE  NEGATIVE Final  MRSA PCR SCREENING     Status: None   Collection Time    02/04/14  2:58 AM      Result Value Ref Range Status   MRSA by PCR NEGATIVE  NEGATIVE Final   Comment:            The GeneXpert MRSA Assay (FDA     approved for NASAL specimens     only), is one component of a     comprehensive MRSA colonization     surveillance program. It is not     intended to diagnose MRSA     infection nor to guide or     monitor treatment for     MRSA infections.    Coagulation Studies:  Recent Labs  02/03/14 2108 02/04/14 0420  LABPROT 15.1 15.4*  INR 1.22 1.25    Imaging: Dg Abd 1 View  02/05/2014   CLINICAL DATA:  Hand up placement  EXAM: ABDOMEN - 1 VIEW  COMPARISON:  02/04/2014  FINDINGS: The feeding catheter is been manipulated into the second portion of the duodenum. 1 min 37 seconds of fluoroscopy was utilized. Contrast injection confirms catheter position.   Electronically Signed   By: Alcide Clever M.D.   On: 02/05/2014 12:44   Dg Chest Port 1 View  02/06/2014   CLINICAL DATA:  Indication  EXAM: PORTABLE CHEST - 1 VIEW  COMPARISON:  DG CHEST 1V PORT dated 02/05/2014  FINDINGS: Endotracheal tube is appropriately positioned. Nasogastric tube tip terminates below the level of the hemidiaphragms but is not included in the field of view. Feeding tube also terminating below the level of the diaphragms but the tip is not included in the field of view. Left IJ central line tip terminates over the mid SVC.  Heart size is normal. The lungs are clear.  IMPRESSION: No acute cardiopulmonary process.  Support apparatus as above.   Electronically Signed   By: Christiana Pellant M.D.   On: 02/06/2014 08:17   Dg Chest Port 1 View  02/05/2014   CLINICAL DATA:  Respiratory difficulty  EXAM: PORTABLE CHEST - 1 VIEW  COMPARISON:  DG CHEST 1V PORT dated 02/04/2014  FINDINGS: Endotracheal tube and NG tube are stable. Lungs are clear. Left internal jugular central venous catheter are stable.  IMPRESSION: No active cardiopulmonary disease.   Electronically Signed   By: Maryclare BeanArt  Hoss M.D.   On: 02/05/2014 08:07   Dg Abd Portable 1v  02/04/2014   CLINICAL DATA:  Status post pyloric tube placement  EXAM: PORTABLE ABDOMEN - 1 VIEW  COMPARISON:  DG ABD 2 VIEWS dated 02/03/2014  FINDINGS: There is a nondisplaced fracture of the posterior tenth and eleventh left ribs.  There is an enteric tube with the tube coiled within the stomach and the tip directed towards the fundus. There is no bowel dilatation to suggest obstruction. There is no evidence of pneumoperitoneum, portal venous gas or pneumatosis. There are no pathologic calcifications along the expected course of the ureters.The osseous structures are unremarkable.  IMPRESSION: Malpositioned enteric tube.  Recommend repositioning.   Electronically Signed   By: Elige KoHetal  Patel   On: 02/04/2014 16:26   Dg Basil DessNaso G Tube Plc W/fl-no Rad  02/05/2014   CLINICAL DATA: portable Fluoro guided NGT placement, for post pyloric   NASO G TUBE PLACEMENT WITH FLUORO  Fluoroscopy was utilized by the requesting physician.  No radiographic  interpretation.        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 She is still able to breathe over the the vent and has very sluggish pupillary reaction, but the overall  findings on neuro-exam in conjunction with EEG ( no brain protocol EEG) are indicative of very poor prognosis. Will suggest follow up brain death protocol EEG in 24 hours. Will follow up.  Wyatt Portelasvaldo Vannak Montenegro, MD

## 2014-02-06 NOTE — Progress Notes (Signed)
PULMONARY / CRITICAL CARE MEDICINE   Name: Jennifer [name] MRN: [id number] DOB: 1961/04/28    ADMISSION DATE:  February 01, 2014 CONSULTATION DATE: 02/03/2014  REFERRING MD :  Resident  CHIEF COMPLAINT:  Heart stopped  BRIEF PATIENT DESCRIPTION:  53 yo female admitted with nausea, vomiting, diarrhea, chills, abdominal pain likely from DM gastroparesis.  She has hx of multiple amputations >> had Rt 5th metatarsal amputation recently for osteomyelitis.  She developed PEA arrest 3/24, and transferred to ICU.  SIGNIFICANT EVENTS: 3/14 Admit 3/22 GI consult 3/24 PEA arrest >> transfer to ICU  STUDIES:  3/14 CT abd/pelvis >> high density material in stomach, medium density material in gallbladder, fibroid uterus 3/17 Abd u/s >> sludge ball in gallbladder 3/19 Gastric emptying study >> abnormal gastric emptying at 2 hrs 3/23 EGD >> severe reflux esophagitis 3/26 CT head: no acute 3/27 CT head>>> 3/27 EEG>>>  LINES / TUBES: ETT 3/24 >>  L IJ TLC 3/24>>> R radial a-line 3/24>>>  CULTURES: Blood 3/14 >> negative Urine 3/14 >> Klebsiella   ANTIBIOTICS: Levaquin >>   SUBJECTIVE: No events overnight.  VITAL SIGNS: Temp:  [96.3 F (35.7 C)-100.8 F (38.2 C)] 100.8 F (38.2 C) (03/27 0700) Pulse Rate:  [71-108] 108 (03/27 0700) Resp:  [12-40] 24 (03/27 0700) BP: (98-138)/(49-87) 107/56 mmHg (03/27 0700) SpO2:  [99 %-100 %] 100 % (03/27 0700) Arterial Line BP: (84-159)/(44-85) 90/44 mmHg (03/27 0700) FiO2 (%):  [30 %] 30 % (03/27 0906) Weight:  [63.6 kg (140 lb 3.4 oz)] 63.6 kg (140 lb 3.4 oz) (03/27 0405) HEMODYNAMICS: CVP:  [0 mmHg-9 mmHg] 9 mmHg VENTILATOR SETTINGS: Vent Mode:  [-] PRVC FiO2 (%):  [30 %] 30 % Set Rate:  [12 bmp] 12 bmp Vt Set:  [340 mL-430 mL] 400 mL PEEP:  [5 cmH20] 5 cmH20 Plateau Pressure:  [11 cmH20-18 cmH20] 11 cmH20 INTAKE / OUTPUT: Intake/Output     03/26 0701 - 03/27 0700 03/27 0701 - 03/28 0700   I.V. (mL/kg) 2731.7 (43)    NG/GT 360    IV  Piggyback     Total Intake(mL/kg) 3091.7 (48.6)    Urine (mL/kg/hr) 775 (0.5)    Emesis/NG output     Stool     Total Output 775     Net +2316.7            PHYSICAL EXAMINATION: General:  Chronically ill appearing female, unresponsive. Neuro:  Pupils fixed and dilated, no dolls eye and no gag. No response to noxious stim  HEENT:  Sweetser/AT, pupils fixed and dilated.  MMM. Cardiovascular:  RRR, Nl S1/S2, -M/R/G. Lungs:  Coarse BS diffusely. Abdomen:  Soft, NT, ND and +BS. Musculoskeletal:  Amputation on right foot, wound clean. Skin:  Intact.  LABS:  CBC  Recent Labs Lab 02/03/14 2055  02/04/14 0822 02/05/14 0400 02/06/14 0500  WBC 9.7  --   --  9.9 7.2  HGB 10.4*  < > 9.9* 10.6* 9.3*  HCT 29.7*  < > 29.0* 29.9* 27.1*  PLT 264  --   --  284 196  < > = values in this interval not displayed. Coag's  Recent Labs Lab 02/03/14 2108 02/04/14 0420  APTT 25 38*  INR 1.22 1.25   BMET  Recent Labs Lab 02/05/14 0400 02/05/14 0800 02/06/14 0500  NA 141 142 145  K 3.6* 3.5* 4.3  CL 104 105 113*  CO2 21 22 21   BUN 18 18 21   CREATININE 2.02* 2.09* 2.15*  GLUCOSE 136* 99 120*  Electrolytes  Recent Labs Lab 02/04/14 0400  02/05/14 0400 02/05/14 0800 02/06/14 0500  CALCIUM 9.2  < > 8.9 8.8 8.5  MG 1.5  --  1.8  --  2.0  PHOS 1.0*  --  5.0*  --  2.8  < > = values in this interval not displayed. Sepsis Markers  Recent Labs Lab 02/03/14 1755  LATICACIDVEN 12.5*   ABG  Recent Labs Lab 02/04/14 0647 02/05/14 0428 02/06/14 0509  PHART 7.459* 7.373 7.389  PCO2ART 31.5* 37.4 35.2  PO2ART 154.0* 136.0* 155.0*   Liver Enzymes No results found for this basename: AST, ALT, ALKPHOS, BILITOT, ALBUMIN,  in the last 168 hours Cardiac Enzymes  Recent Labs Lab 02/03/14 1713 02/03/14 2332 02/04/14 0420  TROPONINI <0.30 0.37* <0.30   Glucose  Recent Labs Lab 02/05/14 1339 02/05/14 1824 02/05/14 1927 02/06/14 0046 02/06/14 0349 02/06/14 0732  GLUCAP  75 91 82 144* 95 140*   Imaging Dg Abd 1 View  02/05/2014   CLINICAL DATA:  Hand up placement  EXAM: ABDOMEN - 1 VIEW  COMPARISON:  02/04/2014  FINDINGS: The feeding catheter is been manipulated into the second portion of the duodenum. 1 min 37 seconds of fluoroscopy was utilized. Contrast injection confirms catheter position.   Electronically Signed   By: Alcide CleverMark  Lukens M.D.   On: 02/05/2014 12:44   Dg Chest Port 1 View  02/06/2014   CLINICAL DATA:  Indication  EXAM: PORTABLE CHEST - 1 VIEW  COMPARISON:  DG CHEST 1V PORT dated 02/05/2014  FINDINGS: Endotracheal tube is appropriately positioned. Nasogastric tube tip terminates below the level of the hemidiaphragms but is not included in the field of view. Feeding tube also terminating below the level of the diaphragms but the tip is not included in the field of view. Left IJ central line tip terminates over the mid SVC. Heart size is normal. The lungs are clear.  IMPRESSION: No acute cardiopulmonary process.  Support apparatus as above.   Electronically Signed   By: Christiana PellantGretchen  Green M.D.   On: 02/06/2014 08:17   Dg Chest Port 1 View  02/05/2014   CLINICAL DATA:  Respiratory difficulty  EXAM: PORTABLE CHEST - 1 VIEW  COMPARISON:  DG CHEST 1V PORT dated 02/04/2014  FINDINGS: Endotracheal tube and NG tube are stable. Lungs are clear. Left internal jugular central venous catheter are stable.  IMPRESSION: No active cardiopulmonary disease.   Electronically Signed   By: Maryclare BeanArt  Hoss M.D.   On: 02/05/2014 08:07   Dg Abd Portable 1v  02/04/2014   CLINICAL DATA:  Status post pyloric tube placement  EXAM: PORTABLE ABDOMEN - 1 VIEW  COMPARISON:  DG ABD 2 VIEWS dated 02/03/2014  FINDINGS: There is a nondisplaced fracture of the posterior tenth and eleventh left ribs.  There is an enteric tube with the tube coiled within the stomach and the tip directed towards the fundus. There is no bowel dilatation to suggest obstruction. There is no evidence of pneumoperitoneum, portal  venous gas or pneumatosis. There are no pathologic calcifications along the expected course of the ureters.The osseous structures are unremarkable.  IMPRESSION: Malpositioned enteric tube.  Recommend repositioning.   Electronically Signed   By: Elige KoHetal  Patel   On: 02/04/2014 16:26   Dg Basil DessNaso G Tube Plc W/fl-no Rad  02/05/2014   CLINICAL DATA: portable Fluoro guided NGT placement, for post pyloric   NASO G TUBE PLACEMENT WITH FLUORO  Fluoroscopy was utilized by the requesting physician.  No radiographic  interpretation.  CXR: clear w/out infiltrates   ASSESSMENT / PLAN:  PULMONARY A: Acute respiratory failure 2nd to PEA arrest P:   - Full vent support - F/u CXR, ABG -abg reviewed, keep same MV on vent -consider SBT with tight apnea, Tv alarms  CARDIOVASCULAR A:  PEA arrest, DDx includes intracranial event, metabolic derangements, PE vs primary cardiac event. >dopplers negative  Hypotension  Hx of HTN. P:  - Monitor hemodynamics. - Continue heparin gtt until able to r/o PE and for cardiac reasons. - Hold anti-HTN meds for now. - CVP goal 8-12, cxr is clear so will give extra 700 cc bolus   - Pressors for BP support, levo to MAp goal 60 - Cards consultation appreciated.  RENAL A:   Acute kidney injury. Scr rising.   P:   - Monitor renal fx, urine outpt, electrolytes. - Replace electrolytes as indicated.  GASTROINTESTINAL A:   DM gastroparesis with reflux esophagitis. P:   - Protonix - cont tube feeds - Keep HOB elevated > 30 degrees  HEMATOLOGIC A:   Anemia of chronic disease. P:  - F/u CBC.  INFECTIOUS A:   Osteomyelitis Rt foot. Klebsiella UTI on admission. P:   - Levaquin per pharmacy   ENDOCRINE A:   Difficult to control DM.   P:   - SSI.  NEUROLOGIC A:   Acute encephalopathy 2nd to PE arrest. I'm concerned that she has had a significant and devastating neurological event  - CT head noted. NOT brain dead on exam as of now with breathig over vent  and movements upper shoulders P: Will request neuro eval for prognostics  Repeat CT  F/u EEG to r/o subclinical seizures If exam c/w brain death overnight would apnea  Concerned that she has suffered a devastating neurological event. Will ask Neuro to see and assist w/ prognostics   I have personally obtained a history, examined the patient, evaluated laboratory and imaging results, formulated the assessment and plan and placed orders.  CRITICAL CARE: The patient is critically ill with multiple organ systems failure and requires high complexity decision making for assessment and support, frequent evaluation and titration of therapies, application of advanced monitoring technologies and extensive interpretation of multiple databases. Critical Care Time devoted to patient care services described in this note is 30 minutes.   Mcarthur Rossetti. Tyson Alias, MD, FACP Pgr: [phone] [facility]

## 2014-02-07 ENCOUNTER — Inpatient Hospital Stay (HOSPITAL_COMMUNITY): Payer: BC Managed Care – PPO

## 2014-02-07 DIAGNOSIS — G931 Anoxic brain damage, not elsewhere classified: Secondary | ICD-10-CM

## 2014-02-07 LAB — CBC
HCT: 25.3 % — ABNORMAL LOW (ref 36.0–46.0)
HEMOGLOBIN: 8.6 g/dL — AB (ref 12.0–15.0)
MCH: 30.2 pg (ref 26.0–34.0)
MCHC: 34 g/dL (ref 30.0–36.0)
MCV: 88.8 fL (ref 78.0–100.0)
PLATELETS: 184 10*3/uL (ref 150–400)
RBC: 2.85 MIL/uL — ABNORMAL LOW (ref 3.87–5.11)
RDW: 14.4 % (ref 11.5–15.5)
WBC: 8.4 10*3/uL (ref 4.0–10.5)

## 2014-02-07 LAB — BLOOD GAS, ARTERIAL
ACID-BASE DEFICIT: 2.8 mmol/L — AB (ref 0.0–2.0)
Bicarbonate: 21.2 mEq/L (ref 20.0–24.0)
DRAWN BY: 24513
FIO2: 30 %
LHR: 12 {breaths}/min
MECHVT: 400 mL
O2 Saturation: 100 %
PCO2 ART: 34.6 mmHg — AB (ref 35.0–45.0)
PEEP: 5 cmH2O
PH ART: 7.404 (ref 7.350–7.450)
Patient temperature: 98.6
TCO2: 22.2 mmol/L (ref 0–100)
pO2, Arterial: 155 mmHg — ABNORMAL HIGH (ref 80.0–100.0)

## 2014-02-07 LAB — GLUCOSE, CAPILLARY
GLUCOSE-CAPILLARY: 119 mg/dL — AB (ref 70–99)
GLUCOSE-CAPILLARY: 122 mg/dL — AB (ref 70–99)
GLUCOSE-CAPILLARY: 141 mg/dL — AB (ref 70–99)
GLUCOSE-CAPILLARY: 149 mg/dL — AB (ref 70–99)
GLUCOSE-CAPILLARY: 156 mg/dL — AB (ref 70–99)
GLUCOSE-CAPILLARY: 80 mg/dL (ref 70–99)
Glucose-Capillary: 129 mg/dL — ABNORMAL HIGH (ref 70–99)

## 2014-02-07 LAB — BASIC METABOLIC PANEL
BUN: 22 mg/dL (ref 6–23)
CALCIUM: 8.5 mg/dL (ref 8.4–10.5)
CO2: 20 meq/L (ref 19–32)
CREATININE: 1.84 mg/dL — AB (ref 0.50–1.10)
Chloride: 114 mEq/L — ABNORMAL HIGH (ref 96–112)
GFR calc Af Amer: 36 mL/min — ABNORMAL LOW (ref >90)
GFR calc non Af Amer: 31 mL/min — ABNORMAL LOW (ref >90)
Glucose, Bld: 164 mg/dL — ABNORMAL HIGH (ref 70–99)
Potassium: 4 mEq/L (ref 3.7–5.3)
Sodium: 145 mEq/L (ref 137–147)

## 2014-02-07 LAB — HEPARIN LEVEL (UNFRACTIONATED)
Heparin Unfractionated: 0.42 IU/mL (ref 0.30–0.70)
Heparin Unfractionated: 0.79 IU/mL — ABNORMAL HIGH (ref 0.30–0.70)
Heparin Unfractionated: 0.86 IU/mL — ABNORMAL HIGH (ref 0.30–0.70)

## 2014-02-07 NOTE — Progress Notes (Signed)
PULMONARY / CRITICAL CARE MEDICINE   Name: Jennifer [name] MRN: [id number] DOB: 07-25-1961    ADMISSION DATE:  01/26/2014 CONSULTATION DATE: 02/03/2014  REFERRING MD :  Resident  CHIEF COMPLAINT:  Heart stopped  BRIEF PATIENT DESCRIPTION:  53 yo female admitted with nausea, vomiting, diarrhea, chills, abdominal pain likely from DM gastroparesis.  She has hx of multiple amputations >> had Rt 5th metatarsal amputation recently for osteomyelitis.  She developed PEA arrest 3/24, and transferred to ICU.  SIGNIFICANT EVENTS: 3/14 Admit 3/22 GI consult 3/24 PEA arrest >> transfer to ICU  STUDIES:  3/14 CT abd/pelvis >> high density material in stomach, medium density material in gallbladder, fibroid uterus 3/17 Abd u/s >> sludge ball in gallbladder 3/19 Gastric emptying study >> abnormal gastric emptying at 2 hrs 3/23 EGD >> severe reflux esophagitis 3/26 CT head: no acute 3/27 CT head>>> 3/27 EEG>>>  LINES / TUBES: ETT 3/24 >>  L IJ TLC 3/24>>> R radial a-line 3/24>>>  CULTURES: Blood 3/14 >> negative Urine 3/14 >> Klebsiella   ANTIBIOTICS: Levaquin >>   SUBJECTIVE: No events overnight.  VITAL SIGNS: Temp:  [98.6 F (37 C)-101.5 F (38.6 C)] 98.6 F (37 C) (03/28 0800) Pulse Rate:  [81-112] 94 (03/28 0831) Resp:  [18-45] 24 (03/28 0831) BP: (95-163)/(50-97) 140/78 mmHg (03/28 0831) SpO2:  [95 %-100 %] 95 % (03/28 0831) Arterial Line BP: (80-177)/(45-89) 107/61 mmHg (03/28 0800) FiO2 (%):  [30 %] 30 % (03/28 0831) Weight:  [142 lb 3.2 oz (64.5 kg)] 142 lb 3.2 oz (64.5 kg) (03/28 0422) HEMODYNAMICS: CVP:  [1 mmHg-65 mmHg] 7 mmHg VENTILATOR SETTINGS: Vent Mode:  [-] PRVC FiO2 (%):  [30 %] 30 % Set Rate:  [12 bmp] 12 bmp Vt Set:  [400 mL] 400 mL PEEP:  [5 cmH20] 5 cmH20 Plateau Pressure:  [14 cmH20-16 cmH20] 14 cmH20 INTAKE / OUTPUT: Intake/Output     03/27 0701 - 03/28 0700 03/28 0701 - 03/29 0700   P.O.  200   I.V. (mL/kg) 2541 (39.4) 112 (1.7)   NG/GT 920 40    IV Piggyback 800    Total Intake(mL/kg) 4261 (66.1) 352 (5.5)   Urine (mL/kg/hr) 2275 (1.5) 250 (1.4)   Total Output 2275 250   Net +1986 +102         PHYSICAL EXAMINATION: General:  Chronically ill appearing female, unresponsive. Neuro:  Pupils fixed, no dolls eye and no gag. No response to noxious stimuli. Does have a respiratory drive. HEENT:  [place]/AT, pupils fixed and dilated.  MMM. Cardiovascular:  RRR, Nl S1/S2, -M/R/G. Lungs:  Coarse BS diffusely. Abdomen:  Soft, NT, ND and +BS. Musculoskeletal:  Amputation on right foot, wound clean. Skin:  Intact.  LABS:  CBC  Recent Labs Lab 02/05/14 0400 02/06/14 0500 02/07/14 0415  WBC 9.9 7.2 8.4  HGB 10.6* 9.3* 8.6*  HCT 29.9* 27.1* 25.3*  PLT 284 196 184   Coag's  Recent Labs Lab 02/03/14 2108 02/04/14 0420  APTT 25 38*  INR 1.22 1.25   BMET  Recent Labs Lab 02/05/14 0800 02/06/14 0500 02/07/14 0415  NA 142 145 145  K 3.5* 4.3 4.0  CL 105 113* 114*  CO2 22 21 20   BUN 18 21 22   CREATININE 2.09* 2.15* 1.84*  GLUCOSE 99 120* 164*   Electrolytes  Recent Labs Lab 02/04/14 0400  02/05/14 0400 02/05/14 0800 02/06/14 0500 02/07/14 0415  CALCIUM 9.2  < > 8.9 8.8 8.5 8.5  MG 1.5  --  1.8  --  2.0  --   PHOS 1.0*  --  5.0*  --  2.8  --   < > = values in this interval not displayed. Sepsis Markers  Recent Labs Lab 02/03/14 1755  LATICACIDVEN 12.5*   ABG  Recent Labs Lab 02/05/14 0428 02/06/14 0509 02/07/14 0416  PHART 7.373 7.389 7.404  PCO2ART 37.4 35.2 34.6*  PO2ART 136.0* 155.0* 155.0*   Liver Enzymes No results found for this basename: AST, ALT, ALKPHOS, BILITOT, ALBUMIN,  in the last 168 hours Cardiac Enzymes  Recent Labs Lab 02/03/14 1713 02/03/14 2332 02/04/14 0420  TROPONINI <0.30 0.37* <0.30   Glucose  Recent Labs Lab 02/06/14 0732 02/06/14 1148 02/06/14 1614 02/06/14 2035 02/07/14 0038 02/07/14 0411  GLUCAP 140* 142* 153* 122* 141* 156*   Imaging Dg Abd 1  View  02/05/2014   CLINICAL DATA:  Hand up placement  EXAM: ABDOMEN - 1 VIEW  COMPARISON:  02/04/2014  FINDINGS: The feeding catheter is been manipulated into the second portion of the duodenum. 1 min 37 seconds of fluoroscopy was utilized. Contrast injection confirms catheter position.   Electronically Signed   By: Alcide CleverMark  Lukens M.D.   On: 02/05/2014 12:44   Dg Chest Port 1 View  02/06/2014   CLINICAL DATA:  Indication  EXAM: PORTABLE CHEST - 1 VIEW  COMPARISON:  DG CHEST 1V PORT dated 02/05/2014  FINDINGS: Endotracheal tube is appropriately positioned. Nasogastric tube tip terminates below the level of the hemidiaphragms but is not included in the field of view. Feeding tube also terminating below the level of the diaphragms but the tip is not included in the field of view. Left IJ central line tip terminates over the mid SVC. Heart size is normal. The lungs are clear.  IMPRESSION: No acute cardiopulmonary process.  Support apparatus as above.   Electronically Signed   By: Christiana PellantGretchen  Green M.D.   On: 02/06/2014 08:17   Dg Vangie BickerNaso G Tube Plc W/fl-no Rad  02/05/2014   CLINICAL DATA: portable Fluoro guided NGT placement, for post pyloric   NASO G TUBE PLACEMENT WITH FLUORO  Fluoroscopy was utilized by the requesting physician.  No radiographic  interpretation.    Ct Portable Head W/o Cm  02/06/2014   CLINICAL DATA:  Unresponsive.  EXAM: CT HEAD WITHOUT CONTRAST  TECHNIQUE: Contiguous axial images were obtained from the base of the skull through the vertex without intravenous contrast.  COMPARISON:  02/03/2014  FINDINGS: No focal finding such as focal infarction, hematoma, hydrocephalus, mass or shift. The portable CT device is not reliable for diagnosing generalized brain swelling, but there is at least a suggestion that sulci are less well seen than on the previous study raising that possibility. The calvarium is unremarkable. Sinuses are clear.  IMPRESSION: No focal finding.  The portable device is not reliable  for diagnosing generalized brain swelling. However, it is possible that the sulci are somewhat effaced compared to the previous study raising at least the possibility of generalized brain swelling.   Electronically Signed   By: Paulina FusiMark  Shogry M.D.   On: 02/06/2014 14:43   CXR: clear w/out infiltrates   ASSESSMENT / PLAN:  PULMONARY A: Acute respiratory failure 2nd to PEA arrest P:   - Full vent support - F/u CXR, ABG - ABG reviewed, keep same MV on vent - Has a respiratory drive, no indication for apnea test or brain death testing at this point.  CARDIOVASCULAR A:  PEA arrest, DDx includes intracranial event, metabolic derangements, PE vs primary  cardiac event. >dopplers negative  Hypotension  Hx of HTN. P:  - Monitor hemodynamics. - Continue heparin gtt until able to r/o PE and for cardiac reasons. - Hold anti-HTN meds for now. - CVP goal 8-12, cxr is clear so will give extra 700 cc bolus   - Pressors for BP support, levo to MAp goal 60 - Cards consultation appreciated.  RENAL A:   Acute kidney injury. Scr rising.   P:   - Monitor renal fx, urine outpt, electrolytes. - Replace electrolytes as indicated.  GASTROINTESTINAL A:   DM gastroparesis with reflux esophagitis. P:   - Protonix - Cont tube feeds - Keep HOB elevated > 30 degrees  HEMATOLOGIC A:   Anemia of chronic disease. P:  - F/u CBC.  INFECTIOUS A:   Osteomyelitis Rt foot. Klebsiella UTI on admission. P:   - Levaquin per pharmacy   ENDOCRINE A:   Difficult to control DM.   P:   - SSI.  NEUROLOGIC A:   Acute encephalopathy 2nd to PE arrest. I'm concerned that she has had a significant and devastating neurological event  - CT head noted. NOT brain dead on exam as of now with breathig over vent and movements upper shoulders P: Will request neuro eval for prognostics  Repeat CT  EEG noted with severe slowing. No apnea test, has a respiratory drive. Neuro consult noted, very poor  prognosis.  Patient's pastor is bedside, asked to gather family as neurologic situation appears very grim and we need to make decisions regarding plan of care.  I have personally obtained a history, examined the patient, evaluated laboratory and imaging results, formulated the assessment and plan and placed orders.  CRITICAL CARE: The patient is critically ill with multiple organ systems failure and requires high complexity decision making for assessment and support, frequent evaluation and titration of therapies, application of advanced monitoring technologies and extensive interpretation of multiple databases. Critical Care Time devoted to patient care services described in this note is 35 minutes.   Alyson Reedy, M.D. [facility] Pulmonary/Critical Care Medicine. Pager: [phone]. After hours pager: [phone].

## 2014-02-07 NOTE — Progress Notes (Signed)
Pt transported to CT and back with no event.

## 2014-02-07 NOTE — Progress Notes (Signed)
ANTICOAGULATION CONSULT NOTE - Follow Up Consult  Pharmacy Consult for Heparin Indication: r/o ACS vs PE (s/p PEA arrest)  No Known Allergies  Patient Measurements: Height: 5\' 6"  (167.6 cm) Weight: 142 lb 3.2 oz (64.5 kg) IBW/kg (Calculated) : 59.3 Heparin Dosing Weight: 64.5 kg  Vital Signs: Temp: 97.7 F (36.5 C) (03/28 1900) Temp src: Core (Comment) (03/28 1800) BP: 156/82 mmHg (03/28 1922) Pulse Rate: 89 (03/28 1922)  Labs:  Recent Labs  02/05/14 0400 02/05/14 0800  02/06/14 0500  02/07/14 0415 02/07/14 1100 02/07/14 1822  HGB 10.6*  --   --  9.3*  --  8.6*  --   --   HCT 29.9*  --   --  27.1*  --  25.3*  --   --   PLT 284  --   --  196  --  184  --   --   HEPARINUNFRC  --   --   < > 0.26*  < > 0.42 0.79* 0.86*  CREATININE 2.02* 2.09*  --  2.15*  --  1.84*  --   --   < > = values in this interval not displayed.  Estimated Creatinine Clearance: 33.9 ml/min (by C-G formula based on Cr of 1.84).   Assessment: 53 yo F continues on heparin for possible ACS vs PE s/p cardiac arrest. She is s/p arctic sun protocol, now fully rewarmed. Heparin level this evening remains SUPRAtherapeutic despite rate decrease this morning (HL 0.86 << 0.79, goal of 0.3-0.7). No CBC since this a.m - no overt s/sx of bleeding noted.   Goal of Therapy:  Heparin level 0.3-0.7 units/ml Monitor platelets by anticoagulation protocol: Yes   Plan:  1. Decrease heparin drip rate to 950 units/hr (9.5 ml/hr) 2. Will continue to monitor for any signs/symptoms of bleeding and will follow up with heparin level in 6 hours   Georgina PillionElizabeth Denver Harder, PharmD, BCPS Clinical Pharmacist Pager: [phone] 02/07/2014 7:52 PM

## 2014-02-07 NOTE — Progress Notes (Signed)
Dr Kendrick FriesMcQuaid informed of fluctuations in pt's vital signs. No clinical assessment changes from previous notation.

## 2014-02-07 NOTE — Progress Notes (Signed)
ANTICOAGULATION CONSULT NOTE - Follow Up Consult  Pharmacy Consult for Heparin Indication: r/o ACS vs PE (s/p PEA arrest)  No Known Allergies  Patient Measurements: Height: 5\' 6"  (167.6 cm) Weight: 142 lb 3.2 oz (64.5 kg) IBW/kg (Calculated) : 59.3 Heparin Dosing Weight: 63.6kg  Vital Signs: Temp: 98.1 F (36.7 C) (03/28 1100) Temp src: Core (Comment) (03/28 1100) BP: 154/76 mmHg (03/28 1220) Pulse Rate: 88 (03/28 1100)  Labs:  Recent Labs  02/05/14 0400 02/05/14 0800  02/06/14 0500  02/06/14 2040 02/07/14 0415 02/07/14 1100  HGB 10.6*  --   --  9.3*  --   --  8.6*  --   HCT 29.9*  --   --  27.1*  --   --  25.3*  --   PLT 284  --   --  196  --   --  184  --   HEPARINUNFRC  --   --   < > 0.26*  < > <0.10* 0.42 0.79*  CREATININE 2.02* 2.09*  --  2.15*  --   --  1.84*  --   < > = values in this interval not displayed.  Estimated Creatinine Clearance: 33.9 ml/min (by C-G formula based on Cr of 1.84).   Assessment: 53 yo F continues on heparin for possible ACS vs PE s/p cardiac arrest. She is s/p arctic sun protocol, now fully rewarmed.  Last level therapeutic with 0.42, but 6h confirmatory level is above goal range at 0.79.  H/H is low and trending down since admission, but Plt's are wnl.  No bleeding issues noted.  Goal of Therapy:  Heparin level 0.3-0.7 units/ml Monitor platelets by anticoagulation protocol: Yes   Plan:  - decrease heparin gtt rate to 1100 units/hr - draw 6h HL - daily HL and CBC - monitor for s/s of bleeding  Harrold DonathNathan E. Achilles Dunkope, PharmD Clinical Pharmacist - Resident Pager: [phone] Pharmacy: [phone] 02/07/2014 12:41 PM

## 2014-02-07 NOTE — Progress Notes (Signed)
ANTICOAGULATION CONSULT NOTE - Follow Up Consult  Pharmacy Consult for heparin Indication: s/p PEA arrest w/ unknown etiology  Labs:  Recent Labs  02/05/14 0400 02/05/14 0800  02/06/14 0500 02/06/14 1400 02/06/14 2040 02/07/14 0415  HGB 10.6*  --   --  9.3*  --   --  8.6*  HCT 29.9*  --   --  27.1*  --   --  25.3*  PLT 284  --   --  196  --   --  184  HEPARINUNFRC  --   --   < > 0.26* 0.23* <0.10* 0.42  CREATININE 2.02* 2.09*  --  2.15*  --   --  1.84*  < > = values in this interval not displayed.   Assessment/Plan:  53yo female now therapeutic on heparin after rate adjustments. Will continue gtt at current rate and confirm stable with additional level.   Vernard GamblesVeronda Oaklee Esther, PharmD, BCPS  02/07/2014,4:56 AM

## 2014-02-08 ENCOUNTER — Inpatient Hospital Stay (HOSPITAL_COMMUNITY): Payer: BC Managed Care – PPO

## 2014-02-08 LAB — BASIC METABOLIC PANEL
BUN: 22 mg/dL (ref 6–23)
CO2: 21 mEq/L (ref 19–32)
Calcium: 8.6 mg/dL (ref 8.4–10.5)
Chloride: 114 mEq/L — ABNORMAL HIGH (ref 96–112)
Creatinine, Ser: 1.39 mg/dL — ABNORMAL HIGH (ref 0.50–1.10)
GFR calc Af Amer: 50 mL/min — ABNORMAL LOW (ref >90)
GFR, EST NON AFRICAN AMERICAN: 43 mL/min — AB (ref >90)
GLUCOSE: 190 mg/dL — AB (ref 70–99)
Potassium: 4 mEq/L (ref 3.7–5.3)
SODIUM: 146 meq/L (ref 137–147)

## 2014-02-08 LAB — HEPARIN LEVEL (UNFRACTIONATED): Heparin Unfractionated: 0.72 IU/mL — ABNORMAL HIGH (ref 0.30–0.70)

## 2014-02-08 LAB — GLUCOSE, CAPILLARY
GLUCOSE-CAPILLARY: 179 mg/dL — AB (ref 70–99)
Glucose-Capillary: 141 mg/dL — ABNORMAL HIGH (ref 70–99)
Glucose-Capillary: 156 mg/dL — ABNORMAL HIGH (ref 70–99)

## 2014-02-08 LAB — BLOOD GAS, ARTERIAL
Acid-base deficit: 1.7 mmol/L (ref 0.0–2.0)
Bicarbonate: 22.3 mEq/L (ref 20.0–24.0)
DRAWN BY: 36277
FIO2: 0.3 %
LHR: 12 {breaths}/min
MECHVT: 400 mL
O2 Saturation: 99.6 %
PATIENT TEMPERATURE: 97.6
PCO2 ART: 35.3 mmHg (ref 35.0–45.0)
PEEP: 5 cmH2O
PH ART: 7.415 (ref 7.350–7.450)
TCO2: 23.4 mmol/L (ref 0–100)
pO2, Arterial: 163 mmHg — ABNORMAL HIGH (ref 80.0–100.0)

## 2014-02-08 LAB — CBC
HCT: 23.7 % — ABNORMAL LOW (ref 36.0–46.0)
Hemoglobin: 8.3 g/dL — ABNORMAL LOW (ref 12.0–15.0)
MCH: 31.3 pg (ref 26.0–34.0)
MCHC: 35 g/dL (ref 30.0–36.0)
MCV: 89.4 fL (ref 78.0–100.0)
PLATELETS: 179 10*3/uL (ref 150–400)
RBC: 2.65 MIL/uL — ABNORMAL LOW (ref 3.87–5.11)
RDW: 14.5 % (ref 11.5–15.5)
WBC: 9 10*3/uL (ref 4.0–10.5)

## 2014-02-08 LAB — MAGNESIUM: Magnesium: 1.5 mg/dL (ref 1.5–2.5)

## 2014-02-08 LAB — PHOSPHORUS: PHOSPHORUS: 2.4 mg/dL (ref 2.3–4.6)

## 2014-02-08 MED ORDER — MORPHINE SULFATE 10 MG/ML IJ SOLN
10.0000 mg/h | INTRAVENOUS | Status: DC
  Administered 2014-02-08: 10 mg/h via INTRAVENOUS
  Filled 2014-02-08 (×2): qty 10

## 2014-02-08 MED ORDER — MORPHINE BOLUS VIA INFUSION
5.0000 mg | INTRAVENOUS | Status: DC | PRN
Start: 2014-02-08 — End: 2014-02-08
  Filled 2014-02-08: qty 20

## 2014-02-11 NOTE — Progress Notes (Signed)
ANTICOAGULATION CONSULT NOTE - Follow Up Consult  Pharmacy Consult for heparin Indication: s/p PEA arrest w/ unknown etiology  Labs:  Recent Labs  02/05/14 0800  02/06/14 0500  02/07/14 0415 02/07/14 1100 02/07/14 1822 02/10/2014 0315  HGB  --   --  9.3*  --  8.6*  --   --  8.3*  HCT  --   --  27.1*  --  25.3*  --   --  23.7*  PLT  --   --  196  --  184  --   --  179  HEPARINUNFRC  --   < > 0.26*  < > 0.42 0.79* 0.86* 0.72*  CREATININE 2.09*  --  2.15*  --  1.84*  --   --   --   < > = values in this interval not displayed.   Assessment: 53yo female remains supratherapeutic one heparin after rate decreases, now approaching goal.  Goal of Therapy:  Heparin level 0.3-0.7 units/ml   Plan:  Will decrease heparin gtt slightly to 900 units/hr and check level in 8hr.  Jennifer [name], PharmD, BCPS  02/10/2014,3:52 AM

## 2014-02-11 NOTE — Progress Notes (Signed)
PULMONARY / CRITICAL CARE MEDICINE   Name: Jennifer [name] MRN: [id number] DOB: [birth year]    ADMISSION DATE:  08-Feb-2014 CONSULTATION DATE: 02/03/2014  REFERRING MD :  Resident  CHIEF COMPLAINT:  Heart stopped  BRIEF PATIENT DESCRIPTION:  53 yo female admitted with nausea, vomiting, diarrhea, chills, abdominal pain likely from DM gastroparesis.  She has hx of multiple amputations >> had Rt 5th metatarsal amputation recently for osteomyelitis.  She developed PEA arrest 3/24, and transferred to ICU.  SIGNIFICANT EVENTS: 3/14 Admit 3/22 GI consult 3/24 PEA arrest >> transfer to ICU  STUDIES:  3/14 CT abd/pelvis >> high density material in stomach, medium density material in gallbladder, fibroid uterus 3/17 Abd u/s >> sludge ball in gallbladder 3/19 Gastric emptying study >> abnormal gastric emptying at 2 hrs 3/23 EGD >> severe reflux esophagitis 3/26 CT head: no acute 3/27 CT head>>> 3/27 EEG>>>  LINES / TUBES: ETT 3/24 >>  L IJ TLC 3/24>>> R radial a-line 3/24>>>  CULTURES: Blood 3/14 >> negative Urine 3/14 >> Klebsiella   ANTIBIOTICS: Levaquin >>   SUBJECTIVE: No events overnight.  VITAL SIGNS: Temp:  [97 F (36.1 C)-98.2 F (36.8 C)] 97.5 F (36.4 C) (03/29 0400) Pulse Rate:  [78-93] 78 (03/29 1000) Resp:  [17-35] 19 (03/29 1000) BP: (122-178)/(66-91) 134/75 mmHg (03/29 0804) SpO2:  [99 %-100 %] 100 % (03/29 1000) Arterial Line BP: (127-174)/(64-96) 137/78 mmHg (03/29 1000) FiO2 (%):  [30 %] 30 % (03/29 0804) Weight:  [137 lb 5.6 oz (62.3 kg)] 137 lb 5.6 oz (62.3 kg) (03/29 0317) HEMODYNAMICS: CVP:  [3 mmHg-7 mmHg] 4 mmHg VENTILATOR SETTINGS: Vent Mode:  [-] PRVC FiO2 (%):  [30 %] 30 % Set Rate:  [12 bmp] 12 bmp Vt Set:  [400 mL] 400 mL PEEP:  [5 cmH20] 5 cmH20 Plateau Pressure:  [12 cmH20-13 cmH20] 13 cmH20 INTAKE / OUTPUT: Intake/Output     03/28 0701 - 03/29 0700 03/29 0701 - 03/30 0700   I.V. (mL/kg) 2649 (42.5) 309 (5)   NG/GT 920 80   IV  Piggyback 50    Total Intake(mL/kg) 3619 (58.1) 389 (6.2)   Urine (mL/kg/hr) 3725 (2.5) 20 (0.1)   Total Output 3725 20   Net -106 +369         PHYSICAL EXAMINATION: General:  Chronically ill appearing female, unresponsive. Neuro:  Pupils fixed, no dolls eye and no gag. No response to noxious stimuli. Does have a respiratory drive. HEENT:  [place]/AT, pupils fixed and dilated.  MMM. Cardiovascular:  RRR, Nl S1/S2, -M/R/G. Lungs:  Coarse BS diffusely. Abdomen:  Soft, NT, ND and +BS. Musculoskeletal:  Amputation on right foot, wound clean. Skin:  Intact.  LABS:  CBC  Recent Labs Lab 02/06/14 0500 02/07/14 0415 02/01/2014 0315  WBC 7.2 8.4 9.0  HGB 9.3* 8.6* 8.3*  HCT 27.1* 25.3* 23.7*  PLT 196 184 179   Coag's  Recent Labs Lab 02/03/14 2108 02/04/14 0420  APTT 25 38*  INR 1.22 1.25   BMET  Recent Labs Lab 02/06/14 0500 02/07/14 0415 01/28/2014 0315  NA 145 145 146  K 4.3 4.0 4.0  CL 113* 114* 114*  CO2 21 20 21   BUN 21 22 22   CREATININE 2.15* 1.84* 1.39*  GLUCOSE 120* 164* 190*   Electrolytes  Recent Labs Lab 02/05/14 0400  02/06/14 0500 02/07/14 0415 01/28/2014 0315  CALCIUM 8.9  < > 8.5 8.5 8.6  MG 1.8  --  2.0  --  1.5  PHOS 5.0*  --  2.8  --  2.4  < > = values in this interval not displayed. Sepsis Markers  Recent Labs Lab 02/03/14 1755  LATICACIDVEN 12.5*   ABG  Recent Labs Lab 02/06/14 0509 02/07/14 0416 01/28/2014 0411  PHART 7.389 7.404 7.415  PCO2ART 35.2 34.6* 35.3  PO2ART 155.0* 155.0* 163.0*   Liver Enzymes No results found for this basename: AST, ALT, ALKPHOS, BILITOT, ALBUMIN,  in the last 168 hours Cardiac Enzymes  Recent Labs Lab 02/03/14 1713 02/03/14 2332 02/04/14 0420  TROPONINI <0.30 0.37* <0.30   Glucose  Recent Labs Lab 02/07/14 1127 02/07/14 1707 02/07/14 1955 01/26/2014 0026 02/03/2014 0343 02/05/2014 0822  GLUCAP 149* 119* 129* 141* 179* 156*   Imaging Ct Head Wo Contrast  01/30/2014   CLINICAL DATA:   Possible cerebral edema on portable head CT  EXAM: CT HEAD WITHOUT CONTRAST  TECHNIQUE: Contiguous axial images were obtained from the base of the skull through the vertex without intravenous contrast.  COMPARISON:  02/06/2014 and 02/03/2014  FINDINGS: Poor gray-white differentiation with effacement of the cortical sulci, compatible with diffuse cerebral edema.  Loss of the normal deep gray nuclei, suggesting global anoxic injury.  Basal cisterns are mildly effaced but patent (series 2/image 10).  No downward cerebellar herniation at the foramen magnum (series 2/image 1).  No evidence of parenchymal hemorrhage or extra-axial fluid collection. No mass lesion, mass effect, or midline shift.  Partial opacification of the bilateral ethmoid sinuses. Minimal partial opacification of the left greater than right mastoid air cells.  No evidence of calvarial fracture.  IMPRESSION: Findings compatible with diffuse cerebral edema, likely secondary to global anoxic injury, as above.   Electronically Signed   By: Charline Bills M.D.   On: 01/14/2014 00:10   Dg Chest Port 1 View  01/23/2014   CLINICAL DATA:  Evaluate endotracheal tube position.  EXAM: PORTABLE CHEST - 1 VIEW  COMPARISON:  DG CHEST 1V PORT dated 02/06/2014  FINDINGS: Endotracheal tube is 4.8 cm above the carina. Heart size is normal. Both lungs are clear. Negative for a pneumothorax. Feeding tube extends into the abdomen. Jugular central line tip in the upper SVC region.  IMPRESSION: No focal chest disease.  Support apparatuses as described.   Electronically Signed   By: Richarda Overlie M.D.   On: 02/06/2014 08:11   Ct Portable Head W/o Cm  02/06/2014   CLINICAL DATA:  Unresponsive.  EXAM: CT HEAD WITHOUT CONTRAST  TECHNIQUE: Contiguous axial images were obtained from the base of the skull through the vertex without intravenous contrast.  COMPARISON:  02/03/2014  FINDINGS: No focal finding such as focal infarction, hematoma, hydrocephalus, mass or shift. The  portable CT device is not reliable for diagnosing generalized brain swelling, but there is at least a suggestion that sulci are less well seen than on the previous study raising that possibility. The calvarium is unremarkable. Sinuses are clear.  IMPRESSION: No focal finding.  The portable device is not reliable for diagnosing generalized brain swelling. However, it is possible that the sulci are somewhat effaced compared to the previous study raising at least the possibility of generalized brain swelling.   Electronically Signed   By: Paulina Fusi M.D.   On: 02/06/2014 14:43   CXR: clear w/out infiltrates   ASSESSMENT / PLAN:  PULMONARY A: Acute respiratory failure 2nd to PEA arrest P:   - Full vent support until family is ready for withdrawal. - D/C further testing  CARDIOVASCULAR A:  PEA arrest, DDx includes intracranial  event, metabolic derangements, PE vs primary cardiac event. >dopplers negative  Hypotension  Hx of HTN. P:  - D/C pressors. - D/C heparin.  RENAL A:   Acute kidney injury. Scr rising.   P:   - D/C further blood draws.  GASTROINTESTINAL A:   DM gastroparesis with reflux esophagitis. P:   - D/C TF.  HEMATOLOGIC A:   Anemia of chronic disease. P:  - D/C further blood draws.  INFECTIOUS A:   Osteomyelitis Rt foot. Klebsiella UTI on admission. P:   - D/C abx.   ENDOCRINE A:   Difficult to control DM.   P:   - D/C CBGs.  NEUROLOGIC A:   Acute encephalopathy 2nd to PE arrest. I'm concerned that she has had a significant and devastating neurological event  - CT head noted. NOT brain dead on exam as of now with breathig over vent and movements upper shoulders P: - Start morphine. - Extubate when family is ready.  Spoke with the entire family today, son and daughter in room.  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  After discussion, decision was made to make patient a full  DNR, start morphine and family is to extubate when ready.  I have personally obtained a history, examined the patient, evaluated laboratory and imaging results, formulated the assessment and plan and placed orders.  CRITICAL CARE: The patient is critically ill with multiple organ systems failure and requires high complexity decision making for assessment and support, frequent evaluation and titration of therapies, application of advanced monitoring technologies and extensive interpretation of multiple databases. Critical Care Time devoted to patient care services described in this note is 35 minutes.   Alyson ReedyWesam G. [name], M.D. [facility] Pulmonary/Critical Care Medicine. Pager: [phone]. After hours pager: [phone].

## 2014-02-11 NOTE — Progress Notes (Signed)
Chaplain gave emotional and grief support by staying with family during removal of life support, through conversation and an empathic and compassionate presence.  Chaplain had prayer while with the pt's family, following pt's death.     January 30, 2014 1300  Clinical Encounter Type  Visited With Family;Patient and family together  Visit Type Spiritual support;Critical Care;Patient actively dying  Referral From Nurse  Spiritual Encounters  Spiritual Needs Emotional;Grief support   Rulon Abideavid B Sherrod, chaplain pager [phone]

## 2014-02-11 NOTE — Discharge Summary (Addendum)
[name]:  Jennifer [name], Jennifer [name]                ACCOUNT NO.:  [id number]  MEDICAL RECORD NO.:  [id number]  LOCATION:  EE                           FACILITY:  [facility]  PHYSICIAN:  Felipa EvenerWesam Jake Scarlette Hogston, MD  DATE OF BIRTH:  1961-09-20  DATE OF ADMISSION:  02/06/2014 DATE OF DISCHARGE:                              DISCHARGE SUMMARY   DEATH SUMMARY  PRIMARY DIAGNOSIS/CAUSE OF DEATH:  Anoxic brain injury.  SECONDARY DIAGNOSES: 1. Diabetic gastroparesis. 2. Diabetic neuropathy. 3. Coronary artery disease. 4. Respiratory failure. 5. Pulseless electrical activity cardiac arrest. 6. Cerebral edema.  The patient was a 53 year old female with an extensive past medical history who was admitted to the hospital for evaluation of her diabetic gastroparesis.  While in the hospital, the patient was found unresponsive, was found to be in pulseless electrical activity cardiac arrest.  Code blue was called.  Code team responded after which the patient was brought down to the Coronary Care Unit for hypothermia protocol.  The patient underwent the hypothermia protocol and upon completion, CT showed severe anoxic injury.  Once paralytic was stopped, the patient was not responsive at which point Neurology consultation was called and their recommendation was for full withdrawal and comfort measures at that point as the patient has no reasonable chance of any recovery.  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 The family all arrived in the designated time.  Morphine was started and the patient was extubated, expired shortly thereafter.     Felipa EvenerWesam Jake Armend Hochstatter, MD     WJY/MEDQ  D:  02/10/2014  T:  02/10/2014  Job:  [id number]

## 2014-02-11 NOTE — Progress Notes (Signed)
25 cc of morphine was wasted in the sink and witnessed by Doy Minceeanna Revis, RN

## 2014-02-11 NOTE — Procedures (Signed)
Extubation Procedure Note  Patient Details:   Name: Jennifer [name] DOB: 09/06/1961 MRN: [id number]   Airway Documentation:     Evaluation  O2 sats: stable throughout Complications: No apparent complications Patient did tolerate procedure well. Bilateral Breath Sounds: Clear;Diminished Suctioning: Airway No  Terminal extubation done at this time. Patient placed on 2 LNC. Family remained at bedside during procedure. RT will continue to monitor if needed.  Lurlean LeydenDick, Laird Runnion Bailey 01/22/2014, 11:58 AM

## 2014-02-11 NOTE — Progress Notes (Signed)
Pt expired. No spontaneous respiratory effort, no pulse assess x 1 minute and confirmed with a second Charity fundraiserN.  Family is at the bedside. The chaplain is at the bedside.  Elink was notified that patient passed away.

## 2014-02-11 NOTE — Progress Notes (Signed)
NEURO HOSPITALIST PROGRESS NOTE   SUBJECTIVE:                                                                                                                        Neurologically unchanged. Off sedatives. EEG was essentially flat.   OBJECTIVE:                                                                                                                           Vital signs in last 24 hours: Temp:  [97 F (36.1 C)-98.2 F (36.8 C)] 97.5 F (36.4 C) (03/29 0400) Pulse Rate:  [78-93] 82 (03/29 0804) Resp:  [17-35] 18 (03/29 0804) BP: (122-178)/(66-91) 134/75 mmHg (03/29 0804) SpO2:  [99 %-100 %] 100 % (03/29 0804) Arterial Line BP: (127-174)/(64-96) 133/73 mmHg (03/29 0800) FiO2 (%):  [30 %] 30 % (03/29 0804) Weight:  [62.3 kg (137 lb 5.6 oz)] 62.3 kg (137 lb 5.6 oz) (03/29 0317)  Intake/Output from previous day: 03/28 0701 - 03/29 0700 In: 3619 [I.V.:2649; NG/GT:920; IV Piggyback:50] Out: 3725 [Urine:3725] Intake/Output this shift: Total I/O In: 149 [I.V.:109; NG/GT:40] Out: 20 [Urine:20] Nutritional status: NPO  Past Medical History  Diagnosis Date  . Diabetes mellitus   . Ulcer of lower limb, unspecified     ulcer on top of foot (right)  . Slow transit constipation   . Disorder of bone and cartilage, unspecified   . Unspecified essential hypertension   . Orthostatic hypotension   . Reflux esophagitis   . Gastroparesis   . Irritable bowel syndrome   . Flatulence, eructation, and gas pain   . Complication of anesthesia   . PONV (postoperative nausea and vomiting)     Neurologic Exam:  Mental Status:  Patient does not respond to verbal stimuli. Does not respond to deep sternal rub. Does not follow commands. No verbalizations noted.  Cranial Nerves:  II: patient does not respond confrontation bilaterally, pupils right 2 mm, left 2 mm,and very sluggish reaction bilaterally  III,IV,VI: doll's response absent bilaterally.   V,VII: corneal reflex absent bilaterally  VIII: patient does not respond to verbal stimuli  IX,X: gag reflex absent, XI: trapezius strength unable to test bilaterally  XII: tongue strength unable to test  Motor:  Extremities flaccid throughout. No spontaneous movement noted. No purposeful movements noted.  Sensory:  Does not respond to noxious stimuli in any extremity.  Deep Tendon Reflexes:  Absent with exception of bilateral brachioradialis 2+  Plantars:  absent bilaterally  Cerebellar:  Unable to perform   Lab Results: Lab Results  Component Value Date/Time   CHOL 210* 02/10/2014  7:25 PM   Lipid Panel No results found for this basename: CHOL, TRIG, HDL, CHOLHDL, VLDL, LDLCALC,  in the last 72 hours  Studies/Results: Ct Head Wo Contrast  03-10-14   CLINICAL DATA:  Possible cerebral edema on portable head CT  EXAM: CT HEAD WITHOUT CONTRAST  TECHNIQUE: Contiguous axial images were obtained from the base of the skull through the vertex without intravenous contrast.  COMPARISON:  02/06/2014 and 02/03/2014  FINDINGS: Poor gray-white differentiation with effacement of the cortical sulci, compatible with diffuse cerebral edema.  Loss of the normal deep gray nuclei, suggesting global anoxic injury.  Basal cisterns are mildly effaced but patent (series 2/image 10).  No downward cerebellar herniation at the foramen magnum (series 2/image 1).  No evidence of parenchymal hemorrhage or extra-axial fluid collection. No mass lesion, mass effect, or midline shift.  Partial opacification of the bilateral ethmoid sinuses. Minimal partial opacification of the left greater than right mastoid air cells.  No evidence of calvarial fracture.  IMPRESSION: Findings compatible with diffuse cerebral edema, likely secondary to global anoxic injury, as above.   Electronically Signed   By: Charline Bills M.D.   On: March 10, 2014 00:10   Dg Chest Port 1 View  03-10-2014   CLINICAL DATA:  Evaluate endotracheal  tube position.  EXAM: PORTABLE CHEST - 1 VIEW  COMPARISON:  DG CHEST 1V PORT dated 02/06/2014  FINDINGS: Endotracheal tube is 4.8 cm above the carina. Heart size is normal. Both lungs are clear. Negative for a pneumothorax. Feeding tube extends into the abdomen. Jugular central line tip in the upper SVC region.  IMPRESSION: No focal chest disease.  Support apparatuses as described.   Electronically Signed   By: Richarda Overlie M.D.   On: Mar 10, 2014 08:11   Ct Portable Head W/o Cm  02/06/2014   CLINICAL DATA:  Unresponsive.  EXAM: CT HEAD WITHOUT CONTRAST  TECHNIQUE: Contiguous axial images were obtained from the base of the skull through the vertex without intravenous contrast.  COMPARISON:  02/03/2014  FINDINGS: No focal finding such as focal infarction, hematoma, hydrocephalus, mass or shift. The portable CT device is not reliable for diagnosing generalized brain swelling, but there is at least a suggestion that sulci are less well seen than on the previous study raising that possibility. The calvarium is unremarkable. Sinuses are clear.  IMPRESSION: No focal finding.  The portable device is not reliable for diagnosing generalized brain swelling. However, it is possible that the sulci are somewhat effaced compared to the previous study raising at least the possibility of generalized brain swelling.   Electronically Signed   By: Paulina Fusi M.D.   On: 02/06/2014 14:43    MEDICATIONS  I have reviewed the patient's current medications.  ASSESSMENT/PLAN:                                                                                                            53 y/o with severe post anoxic brain injury following cardiac arrest with unknown down time.  EEG (no brain death protocol) showed no evidence of electro-cortical activity. Overall, patient has a very grim prognosis. Her boyfriend is at  bedside and I conveyed to him my impression that she had had a catastrophic brain insult with very unlikely or no possibility of meaningful neurological recovery. Neurology will sign off.   Wyatt Portela, MD [facility] [phone]  01/12/2014, 9:27 AM

## 2014-02-11 DEATH — deceased
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
